# Patient Record
Sex: Female | Born: 1985 | Race: Black or African American | Hispanic: No | Marital: Single | Smoking: Current some day smoker
Health system: Southern US, Community
[De-identification: ages and names within clinical notes are randomized; demographics above are authoritative.]

## PROBLEM LIST (undated history)

## (undated) DIAGNOSIS — M5030 Other cervical disc degeneration, unspecified cervical region: Secondary | ICD-10-CM

## (undated) DIAGNOSIS — Z8719 Personal history of other diseases of the digestive system: Secondary | ICD-10-CM

## (undated) DIAGNOSIS — M545 Low back pain, unspecified: Secondary | ICD-10-CM

## (undated) DIAGNOSIS — Z8739 Personal history of other diseases of the musculoskeletal system and connective tissue: Secondary | ICD-10-CM

## (undated) DIAGNOSIS — M1990 Unspecified osteoarthritis, unspecified site: Secondary | ICD-10-CM

## (undated) DIAGNOSIS — R200 Anesthesia of skin: Secondary | ICD-10-CM

## (undated) DIAGNOSIS — F419 Anxiety disorder, unspecified: Secondary | ICD-10-CM

## (undated) DIAGNOSIS — B9681 Helicobacter pylori [H. pylori] as the cause of diseases classified elsewhere: Secondary | ICD-10-CM

## (undated) DIAGNOSIS — IMO0002 Reserved for concepts with insufficient information to code with codable children: Secondary | ICD-10-CM

## (undated) DIAGNOSIS — K279 Peptic ulcer, site unspecified, unspecified as acute or chronic, without hemorrhage or perforation: Secondary | ICD-10-CM

## (undated) DIAGNOSIS — M549 Dorsalgia, unspecified: Secondary | ICD-10-CM

## (undated) DIAGNOSIS — G8929 Other chronic pain: Secondary | ICD-10-CM

## (undated) HISTORY — DX: Low back pain, unspecified: M54.50

## (undated) HISTORY — PX: COLONOSCOPY: SHX174

## (undated) HISTORY — DX: Dorsalgia, unspecified: M54.9

## (undated) HISTORY — DX: Anesthesia of skin: R20.0

## (undated) HISTORY — DX: Other chronic pain: G89.29

## (undated) HISTORY — DX: Personal history of other diseases of the musculoskeletal system and connective tissue: Z87.39

## (undated) HISTORY — DX: Other cervical disc degeneration, unspecified cervical region: M50.30

---
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
## 2008-02-26 ENCOUNTER — Emergency Department (HOSPITAL_COMMUNITY): Admission: EM | Admit: 2008-02-26 | Discharge: 2008-02-26 | Payer: Self-pay | Admitting: Emergency Medicine

## 2008-06-08 ENCOUNTER — Emergency Department (HOSPITAL_COMMUNITY): Admission: EM | Admit: 2008-06-08 | Discharge: 2008-06-08 | Payer: Self-pay | Admitting: Emergency Medicine

## 2008-12-05 ENCOUNTER — Emergency Department (HOSPITAL_COMMUNITY): Admission: EM | Admit: 2008-12-05 | Discharge: 2008-12-05 | Payer: Self-pay | Admitting: Emergency Medicine

## 2008-12-09 ENCOUNTER — Emergency Department (HOSPITAL_COMMUNITY): Admission: EM | Admit: 2008-12-09 | Discharge: 2008-12-09 | Payer: Self-pay | Admitting: Family Medicine

## 2009-04-10 ENCOUNTER — Emergency Department (HOSPITAL_COMMUNITY): Admission: EM | Admit: 2009-04-10 | Discharge: 2009-04-10 | Payer: Self-pay | Admitting: Emergency Medicine

## 2009-06-30 ENCOUNTER — Emergency Department (HOSPITAL_BASED_OUTPATIENT_CLINIC_OR_DEPARTMENT_OTHER): Admission: EM | Admit: 2009-06-30 | Discharge: 2009-06-30 | Payer: Self-pay | Admitting: Emergency Medicine

## 2009-07-31 ENCOUNTER — Emergency Department (HOSPITAL_BASED_OUTPATIENT_CLINIC_OR_DEPARTMENT_OTHER): Admission: EM | Admit: 2009-07-31 | Discharge: 2009-07-31 | Payer: Self-pay | Admitting: Emergency Medicine

## 2009-09-14 ENCOUNTER — Emergency Department (HOSPITAL_BASED_OUTPATIENT_CLINIC_OR_DEPARTMENT_OTHER): Admission: EM | Admit: 2009-09-14 | Discharge: 2009-09-15 | Payer: Self-pay | Admitting: Emergency Medicine

## 2009-11-01 ENCOUNTER — Emergency Department (HOSPITAL_COMMUNITY): Admission: EM | Admit: 2009-11-01 | Discharge: 2009-11-01 | Payer: Self-pay | Admitting: Family Medicine

## 2009-12-29 ENCOUNTER — Emergency Department (HOSPITAL_BASED_OUTPATIENT_CLINIC_OR_DEPARTMENT_OTHER): Admission: EM | Admit: 2009-12-29 | Discharge: 2009-12-29 | Payer: Self-pay | Admitting: Emergency Medicine

## 2010-01-19 ENCOUNTER — Emergency Department (HOSPITAL_COMMUNITY): Admission: EM | Admit: 2010-01-19 | Discharge: 2010-01-19 | Payer: Self-pay | Admitting: Family Medicine

## 2010-05-16 ENCOUNTER — Emergency Department (HOSPITAL_BASED_OUTPATIENT_CLINIC_OR_DEPARTMENT_OTHER)
Admission: EM | Admit: 2010-05-16 | Discharge: 2010-05-16 | Payer: Self-pay | Source: Home / Self Care | Admitting: Emergency Medicine

## 2010-08-20 LAB — WET PREP, GENITAL: Trich, Wet Prep: NONE SEEN

## 2010-08-20 LAB — RPR: RPR Ser Ql: NONREACTIVE

## 2010-08-20 LAB — HIV ANTIBODY (ROUTINE TESTING W REFLEX): HIV: NONREACTIVE

## 2010-08-21 LAB — CBC
MCH: 29.1 pg (ref 26.0–34.0)
MCHC: 32.5 g/dL (ref 30.0–36.0)
MCV: 89.4 fL (ref 78.0–100.0)
Platelets: 322 10*3/uL (ref 150–400)
RBC: 5.03 MIL/uL (ref 3.87–5.11)

## 2010-08-21 LAB — URINALYSIS, ROUTINE W REFLEX MICROSCOPIC
Hgb urine dipstick: NEGATIVE
Ketones, ur: 40 mg/dL — AB
Nitrite: NEGATIVE
Specific Gravity, Urine: 1.035 — ABNORMAL HIGH (ref 1.005–1.030)
Urobilinogen, UA: 1 mg/dL (ref 0.0–1.0)

## 2010-08-21 LAB — COMPREHENSIVE METABOLIC PANEL
AST: 21 U/L (ref 0–37)
Albumin: 4.5 g/dL (ref 3.5–5.2)
BUN: 11 mg/dL (ref 6–23)
CO2: 27 mEq/L (ref 19–32)
Calcium: 9.6 mg/dL (ref 8.4–10.5)
Chloride: 105 mEq/L (ref 96–112)
Creatinine, Ser: 0.8 mg/dL (ref 0.4–1.2)
GFR calc Af Amer: >60 mL/min (ref >60)
GFR calc non Af Amer: >60 mL/min (ref >60)
Total Bilirubin: 0.8 mg/dL (ref 0.3–1.2)

## 2010-08-21 LAB — DIFFERENTIAL
Basophils Absolute: 0.2 10*3/uL — ABNORMAL HIGH (ref 0.0–0.1)
Lymphocytes Relative: 5 % — ABNORMAL LOW (ref 12–46)
Lymphs Abs: 0.6 10*3/uL — ABNORMAL LOW (ref 0.7–4.0)
Neutro Abs: 9.9 10*3/uL — ABNORMAL HIGH (ref 1.7–7.7)

## 2010-08-21 LAB — PREGNANCY, URINE: Preg Test, Ur: NEGATIVE

## 2010-08-21 LAB — LIPASE, BLOOD: Lipase: 94 U/L (ref 23–300)

## 2010-09-08 LAB — STREP A DNA PROBE: Group A Strep Probe: NEGATIVE

## 2010-12-13 ENCOUNTER — Emergency Department (INDEPENDENT_AMBULATORY_CARE_PROVIDER_SITE_OTHER): Payer: Managed Care, Other (non HMO)

## 2010-12-13 ENCOUNTER — Emergency Department (HOSPITAL_BASED_OUTPATIENT_CLINIC_OR_DEPARTMENT_OTHER)
Admission: EM | Admit: 2010-12-13 | Discharge: 2010-12-13 | Disposition: A | Discharge status: Home / Self Care | Payer: Managed Care, Other (non HMO) | Attending: Emergency Medicine | Admitting: Emergency Medicine

## 2010-12-13 ENCOUNTER — Encounter: Payer: Self-pay | Admitting: *Deleted

## 2010-12-13 ENCOUNTER — Emergency Department (HOSPITAL_BASED_OUTPATIENT_CLINIC_OR_DEPARTMENT_OTHER): Payer: Managed Care, Other (non HMO)

## 2010-12-13 DIAGNOSIS — M25476 Effusion, unspecified foot: Secondary | ICD-10-CM

## 2010-12-13 DIAGNOSIS — X58XXXA Exposure to other specified factors, initial encounter: Secondary | ICD-10-CM

## 2010-12-13 DIAGNOSIS — M25579 Pain in unspecified ankle and joints of unspecified foot: Secondary | ICD-10-CM

## 2010-12-13 DIAGNOSIS — M25473 Effusion, unspecified ankle: Secondary | ICD-10-CM

## 2010-12-13 DIAGNOSIS — S93409A Sprain of unspecified ligament of unspecified ankle, initial encounter: Secondary | ICD-10-CM

## 2010-12-13 DIAGNOSIS — Y9367 Activity, basketball: Secondary | ICD-10-CM | POA: Insufficient documentation

## 2010-12-13 DIAGNOSIS — W19XXXA Unspecified fall, initial encounter: Secondary | ICD-10-CM | POA: Insufficient documentation

## 2010-12-13 MED ORDER — HYDROCODONE-ACETAMINOPHEN 5-325 MG PO TABS: 2.0000 | ORAL_TABLET | ORAL | Status: AC | PRN

## 2010-12-13 MED ORDER — HYDROCODONE-ACETAMINOPHEN 5-325 MG PO TABS
2.0000 | ORAL_TABLET | Freq: Once | ORAL | Status: AC
  Administered 2010-12-13: 2 via ORAL
  Filled 2010-12-13: qty 2

## 2010-12-13 NOTE — ED Provider Notes (Signed)
History     Chief Complaint  Patient presents with  . Ankle Pain    Right ankle inj yesterday. Leg bent backward while playing and she heard a pop.   HPI Comments: Pt turned ankle,  Pt complains of swelling and pain  Patient is a 25 y.o. female presenting with ankle pain. The history is provided by the patient.  Ankle Pain  The incident occurred yesterday. The injury mechanism was torsion. The pain is present in the left ankle. The pain is at a severity of 7/10. The pain is moderate. Associated symptoms include inability to bear weight.    History reviewed. No pertinent past medical history.  History reviewed. No pertinent past surgical history.  History reviewed. No pertinent family history.  History  Substance Use Topics  . Smoking status: Current Some Day Smoker  . Smokeless tobacco: Not on file  . Alcohol Use: 1.2 oz/week    2 Shots of liquor per week    OB History    Grav Para Term Preterm Abortions TAB SAB Ect Mult Living                  Review of Systems  Constitutional: Negative.   Musculoskeletal: Positive for joint swelling.  Skin: Positive for color change.    Physical Exam  BP 113/66  Pulse 59  Temp(Src) 99.1 F (37.3 C) (Oral)  Resp 20  Ht 4\' 11"  (1.499 m)  Wt 135 lb (61.236 kg)  BMI 27.27 kg/m2  SpO2 100%  LMP 11/16/2010  Physical Exam  Constitutional: She appears well-developed and well-nourished.  HENT:  Head: Normocephalic.  Musculoskeletal: She exhibits edema and tenderness.       Ankle,  Decreased range of motion,  nv and ns intact,  No gross instability    ED Course  Procedures  MDM No fx,  Pt placed in aso and crutches,  Pt referred to Dr. Pearletha Forge for followup      Langston Masker, [place] 12/13/10 1311 Medical screening examination/treatment/procedure(s) were performed by non-physician practitioner and as supervising physician I was immediately available for consultation/collaboration.   Doug Sou, MD 12/17/10 1453

## 2011-03-05 ENCOUNTER — Emergency Department (HOSPITAL_BASED_OUTPATIENT_CLINIC_OR_DEPARTMENT_OTHER)
Admission: EM | Admit: 2011-03-05 | Discharge: 2011-03-05 | Disposition: A | Discharge status: Home / Self Care | Payer: Managed Care, Other (non HMO) | Attending: Emergency Medicine | Admitting: Emergency Medicine

## 2011-03-05 ENCOUNTER — Encounter (HOSPITAL_BASED_OUTPATIENT_CLINIC_OR_DEPARTMENT_OTHER): Payer: Self-pay | Admitting: Emergency Medicine

## 2011-03-05 DIAGNOSIS — J029 Acute pharyngitis, unspecified: Secondary | ICD-10-CM

## 2011-03-05 MED ORDER — DEXAMETHASONE SODIUM PHOSPHATE 10 MG/ML IJ SOLN
10.0000 mg | Freq: Once | INTRAMUSCULAR | Status: AC
  Administered 2011-03-05: 10 mg via INTRAMUSCULAR
  Filled 2011-03-05: qty 1

## 2011-03-05 NOTE — ED Notes (Signed)
Pt reports sore throat x 3 day

## 2011-03-05 NOTE — ED Provider Notes (Signed)
History   Chart scribed for Nat Christen, MD by Enos Fling; the patient was seen in room [facility]3/[facility]; this patient's care was started at 6:04 PM.    CSN: [id number] Arrival date & time: 03/05/2011  5:17 PM  Chief Complaint  Patient presents with  . Sore Throat    sore throat x 3 days    HPI Gwynn Chalker is a 25 y.o. female who presents to the Emergency Department complaining of sore throat. Pt states sore throat onset yesterday, has been constant since and worse with swallowing. +cough. Pt noticed redness to back of her throat, concerned for strep. Pt tolerating fluids well. No congestion, headache, nausea, vomiting, fever, or chills. No sick contacts.   History reviewed. No pertinent past medical history.  History reviewed. No pertinent past surgical history.  History reviewed. No pertinent family history.  History  Substance Use Topics  . Smoking status: Current Some Day Smoker  . Smokeless tobacco: Not on file  . Alcohol Use: 1.2 oz/week    2 Shots of liquor per week    OB History    Grav Para Term Preterm Abortions TAB SAB Ect Mult Living                  Review of Systems  Constitutional: Negative for fever and chills.  HENT: Positive for sore throat. Negative for congestion, rhinorrhea, trouble swallowing and sinus pressure.   Eyes: Negative for redness.  Respiratory: Positive for cough. Negative for shortness of breath.   Cardiovascular: Negative for chest pain and leg swelling.  Gastrointestinal: Negative for nausea, vomiting, abdominal pain and diarrhea.  Genitourinary: Negative for flank pain.  Musculoskeletal: Negative for back pain.  Skin: Negative for rash.  Neurological: Negative for headaches.    Allergies  Review of patient's allergies indicates no known allergies.  Home Medications  No current outpatient prescriptions on file.  BP 131/76  Pulse 109  Temp(Src) 98.7 F (37.1 C) (Oral)  Resp 22  SpO2 99%  LMP 02/02/2011  Physical  Exam  Constitutional: She is oriented to person, place, and time. She appears well-developed and well-nourished.  Non-toxic appearance. She does not have a sickly appearance.  HENT:  Head: Normocephalic and atraumatic.  Nose: Nose normal.  Mouth/Throat: Uvula is midline. Mucous membranes are not dry. Oropharyngeal exudate, posterior oropharyngeal edema and posterior oropharyngeal erythema present. No tonsillar abscesses.  Eyes: Conjunctivae, EOM and lids are normal. Pupils are equal, round, and reactive to light. No scleral icterus.  Neck: Trachea normal and normal range of motion. Neck supple.  Cardiovascular: Regular rhythm and normal heart sounds.   Pulmonary/Chest: Effort normal and breath sounds normal. She has no wheezes. She has no rales.  Abdominal: Soft. Normal appearance. There is no tenderness. There is no rebound, no guarding and no CVA tenderness.  Musculoskeletal: Normal range of motion.  Lymphadenopathy:    She has no cervical adenopathy.  Neurological: She is alert and oriented to person, place, and time. She has normal strength.  Skin: Skin is warm, dry and intact. No rash noted.    ED Course  Procedures - none   Labs Reviewed  RAPID STREP SCREEN   All results reviewed and discussed, questions answered, pt agreeable with plan.  OTHER DATA REVIEWED: Nursing notes and vital signs reviewed.   MEDS GIVEN IN ED: Medications - No data to display    MDM  Patient with viral pharyngitis given her negative strep exam at this point in time. She is able to  tolerate by mouth intake currently. She appears well and is in no respiratory distress. He should be given shot of Decadron to decrease her swelling and had improvement in her symptoms sooner.   IMPRESSION: No diagnosis found.  DISCHARGE MEDICATIONS: New Prescriptions   No medications on file    SCRIBE ATTESTATION: I personally performed the services described in this documentation, which was scribed in my  presence. The recorded information has been reviewed and considered.       Nat Christen, MD 03/05/11 [phone]

## 2011-03-07 LAB — POCT URINALYSIS DIP (DEVICE)
Bilirubin Urine: NEGATIVE
Hgb urine dipstick: NEGATIVE
Ketones, ur: NEGATIVE
Protein, ur: NEGATIVE
Specific Gravity, Urine: 1.025
pH: 6.5

## 2011-03-08 ENCOUNTER — Emergency Department (HOSPITAL_BASED_OUTPATIENT_CLINIC_OR_DEPARTMENT_OTHER)
Admission: EM | Admit: 2011-03-08 | Discharge: 2011-03-08 | Disposition: A | Discharge status: Home / Self Care | Payer: Managed Care, Other (non HMO) | Attending: Emergency Medicine | Admitting: Emergency Medicine

## 2011-03-08 ENCOUNTER — Encounter (HOSPITAL_BASED_OUTPATIENT_CLINIC_OR_DEPARTMENT_OTHER): Payer: Self-pay | Admitting: *Deleted

## 2011-03-08 DIAGNOSIS — J039 Acute tonsillitis, unspecified: Secondary | ICD-10-CM

## 2011-03-08 MED ORDER — METHYLPREDNISOLONE 4 MG PO KIT: PACK | ORAL | Status: AC

## 2011-03-08 MED ORDER — IBUPROFEN 800 MG PO TABS
800.0000 mg | ORAL_TABLET | Freq: Once | ORAL | Status: AC
  Administered 2011-03-08: 800 mg via ORAL
  Filled 2011-03-08: qty 1

## 2011-03-08 MED ORDER — OXYCODONE-ACETAMINOPHEN 5-325 MG PO TABS
1.0000 | ORAL_TABLET | Freq: Once | ORAL | Status: AC
  Administered 2011-03-08: 1 via ORAL
  Filled 2011-03-08: qty 1

## 2011-03-08 MED ORDER — CLINDAMYCIN HCL 150 MG PO CAPS: 150.0000 mg | ORAL_CAPSULE | Freq: Four times a day (QID) | ORAL | Status: AC

## 2011-03-08 MED ORDER — HYDROCODONE-ACETAMINOPHEN 5-500 MG PO TABS: 1.0000 | ORAL_TABLET | Freq: Four times a day (QID) | ORAL | Status: AC | PRN

## 2011-03-08 NOTE — ED Notes (Signed)
Sore throat, cough, ear pressure.

## 2011-03-08 NOTE — ED Notes (Signed)
Pt medicated

## 2011-03-08 NOTE — ED Provider Notes (Signed)
History     CSN: [id number] Arrival date & time: 03/08/2011  1:21 PM  Chief Complaint  Patient presents with  . Sore Throat    (Consider location/radiation/quality/duration/timing/severity/associated sxs/prior treatment) HPI Patient presents with complaint of sore throat. Sore throat has been present for the past 3-4 days and continuous. Patient has pain with swallowing. Has been able to swallow liquids however and no difficulty breathing. States she's had occasional cough and intermittent sweating. No fever. Was seen in the ED 3 days ago and had a negative rapid strep screen performed. Was given Decadron IM for inflammation of her throat however patient states that pain has continued and not improved.  History reviewed. No pertinent past medical history.  History reviewed. No pertinent past surgical history.  No family history on file.  History  Substance Use Topics  . Smoking status: Current Some Day Smoker  . Smokeless tobacco: Not on file  . Alcohol Use: 1.2 oz/week    2 Shots of liquor per week    OB History    Grav Para Term Preterm Abortions TAB SAB Ect Mult Living                  Review of Systems ROS reviewed and otherwise negative except for mentioned in HPI  Allergies  Review of patient's allergies indicates no known allergies.  Home Medications  No current outpatient prescriptions on file.  BP 114/69  Pulse 82  Temp(Src) 98.4 F (36.9 C) (Oral)  Resp 20  SpO2 100%  LMP 02/02/2011 Vitals reviewed Physical Exam Physical Examination: General appearance - alert, well appearing, and in no distress Mental status - alert, oriented to person, place, and time Eyes - pupils equal and reactive, extraocular eye movements intact Mouth - MMM, posterior OP with moderate erythema, bilateral exudate, palate symmetric, uvula midline Lymphatics - no hepatosplenomegaly, shotty anterior cervical LAD Chest - clear to auscultation, no wheezes, rales or rhonchi,  symmetric air entry Heart - normal rate, regular rhythm, normal S1, S2, no murmurs, rubs, clicks or gallops Abdomen - soft, nontender, nondistended, no masses or organomegaly Extremities - peripheral pulses normal, no pedal edema, no clubbing or cyanosis Skin - normal coloration and turgor, no rashes, no suspicious skin lesions noted   ED Course  Procedures (including critical care time)   Labs Reviewed  RAPID STREP SCREEN  MONONUCLEOSIS SCREEN   No results found. Rapid strep and mono screen both negative.  Suspect viral infection, however will treat with antibiotics in case of strep infection.  Pt also given medrol dose pack to help with inflammation.    No diagnosis found.    MDM  Pt with continued sore throat, tonsils swollen, erythematous with exudate.  Will recheck strep screen, and monospot.  Pt appears well hydrated and nontoxic. Pain control given as well.  No evidence of peritonsillar abscess on exam.   Discharged with strict return precautions.  Pt agreeable with plan.       Ethelda Chick, MD 03/08/11 [phone]

## 2011-06-07 DIAGNOSIS — Z8739 Personal history of other diseases of the musculoskeletal system and connective tissue: Secondary | ICD-10-CM

## 2011-06-07 HISTORY — DX: Personal history of other diseases of the musculoskeletal system and connective tissue: Z87.39

## 2011-06-17 ENCOUNTER — Other Ambulatory Visit: Payer: Self-pay | Admitting: Obstetrics and Gynecology

## 2011-06-17 ENCOUNTER — Other Ambulatory Visit (HOSPITAL_COMMUNITY)
Admission: RE | Admit: 2011-06-17 | Discharge: 2011-06-17 | Disposition: A | Discharge status: Home / Self Care | Payer: Managed Care, Other (non HMO) | Source: Ambulatory Visit | Attending: Obstetrics and Gynecology | Admitting: Obstetrics and Gynecology

## 2011-06-17 DIAGNOSIS — Z113 Encounter for screening for infections with a predominantly sexual mode of transmission: Secondary | ICD-10-CM | POA: Insufficient documentation

## 2011-06-17 DIAGNOSIS — Z124 Encounter for screening for malignant neoplasm of cervix: Secondary | ICD-10-CM | POA: Insufficient documentation

## 2011-07-05 ENCOUNTER — Ambulatory Visit (INDEPENDENT_AMBULATORY_CARE_PROVIDER_SITE_OTHER): Payer: Managed Care, Other (non HMO) | Admitting: General Surgery

## 2011-07-05 ENCOUNTER — Encounter (INDEPENDENT_AMBULATORY_CARE_PROVIDER_SITE_OTHER): Payer: Self-pay | Admitting: General Surgery

## 2011-07-05 VITALS — BP 116/68 | HR 72 | Temp 100.5°F | Resp 16 | Ht 59.0 in | Wt 132.0 lb

## 2011-07-05 DIAGNOSIS — L0591 Pilonidal cyst without abscess: Secondary | ICD-10-CM

## 2011-07-05 DIAGNOSIS — L0592 Pilonidal sinus without abscess: Secondary | ICD-10-CM | POA: Insufficient documentation

## 2011-07-05 NOTE — Progress Notes (Signed)
HPI The patient comes in with a history of a previously incised and drained pilonidal cyst about a year ago.  PE On examination she has about a 1-1/2 cm right intergluteal laceration which is well healed no evidence of acute infection. 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 There is no subcutaneous cyst palpable at this time.  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  Plan The patient has been asymptomatic for year and currently her complaints are pain did extend down to the rectum. However I do not see any evidence of recurrent disease at this time.  I would wait until she became symptomatic again and it became inflamed prior on antibiotics if need be drained her again and then plan for wide excision. She's been asymptomatic and therefore do not think she requires wide excision at this time.

## 2012-03-13 ENCOUNTER — Emergency Department (HOSPITAL_BASED_OUTPATIENT_CLINIC_OR_DEPARTMENT_OTHER)
Admission: EM | Admit: 2012-03-13 | Discharge: 2012-03-13 | Disposition: A | Discharge status: Home / Self Care | Payer: Managed Care, Other (non HMO) | Attending: Emergency Medicine | Admitting: Emergency Medicine

## 2012-03-13 ENCOUNTER — Encounter (HOSPITAL_BASED_OUTPATIENT_CLINIC_OR_DEPARTMENT_OTHER): Payer: Self-pay

## 2012-03-13 DIAGNOSIS — Z803 Family history of malignant neoplasm of breast: Secondary | ICD-10-CM | POA: Insufficient documentation

## 2012-03-13 DIAGNOSIS — R197 Diarrhea, unspecified: Secondary | ICD-10-CM | POA: Insufficient documentation

## 2012-03-13 DIAGNOSIS — Z87891 Personal history of nicotine dependence: Secondary | ICD-10-CM | POA: Insufficient documentation

## 2012-03-13 DIAGNOSIS — R1110 Vomiting, unspecified: Secondary | ICD-10-CM | POA: Insufficient documentation

## 2012-03-13 LAB — URINALYSIS, ROUTINE W REFLEX MICROSCOPIC
Nitrite: NEGATIVE
Protein, ur: NEGATIVE mg/dL
Specific Gravity, Urine: 1.007 (ref 1.005–1.030)
Urobilinogen, UA: 0.2 mg/dL (ref 0.0–1.0)

## 2012-03-13 LAB — URINE MICROSCOPIC-ADD ON

## 2012-03-13 MED ORDER — ONDANSETRON 8 MG PO TBDP
8.0000 mg | ORAL_TABLET | Freq: Once | ORAL | Status: AC
  Administered 2012-03-13: 8 mg via ORAL
  Filled 2012-03-13: qty 1

## 2012-03-13 NOTE — ED Notes (Signed)
Pt reports abdominal pain, nausea, vomiting and diarrhea x 3 weeks.

## 2012-03-13 NOTE — ED Provider Notes (Signed)
History     CSN: [id number]  Arrival date & time 03/13/12  1333   First MD Initiated Contact with Patient 03/13/12 1451      Chief Complaint  Patient presents with  . Abdominal Pain  . Nausea  . Emesis  . Diarrhea    (Consider location/radiation/quality/duration/timing/severity/associated sxs/prior treatment) HPI  Patient complaining of some diffuse crampy abdominal pain for several days. She states that she has had loose stools 4-5 times per day for a week. She also complains of nausea with one episode of vomiting per day he states past week. She has had some intermittent subjective fever and headaches. She is living in the house mother and states that no one else has had similar symptoms. She voices concern regarding HIV exposure in prison. She states that for exposure would have been oral vaginal. She denies any head injury, sore throat, chest pain, cough, dyspnea, abnormal vaginal discharge, blood in stool, or rash. She denies any history of sexual transmitted disease.  Past Medical History  Diagnosis Date  . Pilonidal cyst     History reviewed. No pertinent past surgical history.  Family History  Problem Relation Age of Onset  . Cancer Maternal Grandmother     breast    History  Substance Use Topics  . Smoking status: Former Smoker -- 0.2 packs/day  . Smokeless tobacco: Not on file  . Alcohol Use: 1.2 oz/week    2 Shots of liquor per week     weekends    OB History    Grav Para Term Preterm Abortions TAB SAB Ect Mult Living                  Review of Systems  Constitutional: Negative for fever, chills, activity change, appetite change and unexpected weight change.  HENT: Negative for sore throat, rhinorrhea, neck pain, neck stiffness and sinus pressure.   Eyes: Negative for visual disturbance.  Respiratory: Negative for cough and shortness of breath.   Cardiovascular: Negative for chest pain and leg swelling.  Gastrointestinal: Negative for vomiting,  abdominal pain, diarrhea and blood in stool.  Genitourinary: Negative for dysuria, urgency, frequency, vaginal discharge and difficulty urinating.  Musculoskeletal: Negative for myalgias, arthralgias and gait problem.  Skin: Negative for color change and rash.  Neurological: Negative for weakness, light-headedness and headaches.  Hematological: Does not bruise/bleed easily.  Psychiatric/Behavioral: Negative for dysphoric mood.    Allergies  Review of patient's allergies indicates no known allergies.  Home Medications   Current Outpatient Rx  Name Route Sig Dispense Refill  . MEDROXYPROGESTERONE ACETATE 150 MG/ML IM SUSP  Every 3 months.    . MEFENAMIC ACID 250 MG PO CAPS  Ad lib.      BP 127/70  Pulse 94  Temp 98.1 F (36.7 C) (Oral)  Resp 20  Ht 4\' 11"  (1.499 m)  Wt 134 lb (60.782 kg)  BMI 27.06 kg/m2  SpO2 100%  LMP 11/12/2011  Physical Exam  Nursing note and vitals reviewed. Constitutional: She is oriented to person, place, and time. She appears well-developed and well-nourished.  HENT:  Head: Normocephalic and atraumatic.  Right Ear: External ear normal.  Left Ear: External ear normal.  Eyes: Conjunctivae normal and EOM are normal. Pupils are equal, round, and reactive to light.  Neck: Normal range of motion. Neck supple.  Cardiovascular: Normal rate and regular rhythm.   Pulmonary/Chest: Effort normal and breath sounds normal.  Abdominal: Soft. Bowel sounds are normal. There is no tenderness. There is no  rebound.  Musculoskeletal: Normal range of motion.  Neurological: She is alert and oriented to person, place, and time.  Skin: Skin is warm and dry.  Psychiatric: She has a normal mood and affect.    ED Course  Procedures (including critical care time)  Labs Reviewed  URINALYSIS, ROUTINE W REFLEX MICROSCOPIC - Abnormal; Notable for the following:    APPearance CLOUDY (*)     Hgb urine dipstick LARGE (*)     Leukocytes, UA SMALL (*)     All other  components within normal limits  URINE MICROSCOPIC-ADD ON - Abnormal; Notable for the following:    Squamous Epithelial / LPF FEW (*)     Bacteria, UA MANY (*)     All other components within normal limits  PREGNANCY, URINE   No results found.   No diagnosis found.  Results for orders placed during the hospital encounter of 03/13/12  PREGNANCY, URINE      Component Value Range   Preg Test, Ur NEGATIVE  NEGATIVE  URINALYSIS, ROUTINE W REFLEX MICROSCOPIC      Component Value Range   Color, Urine YELLOW  YELLOW   APPearance CLOUDY (*) CLEAR   Specific Gravity, Urine 1.007  1.005 - 1.030   pH 7.0  5.0 - 8.0   Glucose, UA NEGATIVE  NEGATIVE mg/dL   Hgb urine dipstick LARGE (*) NEGATIVE   Bilirubin Urine NEGATIVE  NEGATIVE   Ketones, ur NEGATIVE  NEGATIVE mg/dL   Protein, ur NEGATIVE  NEGATIVE mg/dL   Urobilinogen, UA 0.2  0.0 - 1.0 mg/dL   Nitrite NEGATIVE  NEGATIVE   Leukocytes, UA SMALL (*) NEGATIVE  URINE MICROSCOPIC-ADD ON      Component Value Range   Squamous Epithelial / LPF FEW (*) RARE   WBC, UA 7-10  <3 WBC/hpf   RBC / HPF 3-6  <3 RBC/hpf   Bacteria, UA MANY (*) RARE   Urine-Other MUCOUS PRESENT       MDM  Patient with negative pregnancy test here. Her urine appears to be contaminated. She had her last at the shot in April and has had some spotting. She does not give any other symptoms consistent with urinary tract infection. Plan to treat her vomiting and diarrhea symptomatically with Zofran. The patient is advised regarding sexually transmitted diseases and is given referral to the health department for followup HIV testing.       Hilario Quarry, MD 03/13/12 [phone]

## 2012-03-13 NOTE — ED Notes (Signed)
Pt reports she is also concerned with HIV and wants to be tested due to unprotected sex and partner being HIV positive.

## 2012-05-16 ENCOUNTER — Emergency Department (HOSPITAL_BASED_OUTPATIENT_CLINIC_OR_DEPARTMENT_OTHER)
Admission: EM | Admit: 2012-05-16 | Discharge: 2012-05-16 | Disposition: A | Discharge status: Home / Self Care | Payer: Managed Care, Other (non HMO) | Attending: Emergency Medicine | Admitting: Emergency Medicine

## 2012-05-16 ENCOUNTER — Encounter (HOSPITAL_BASED_OUTPATIENT_CLINIC_OR_DEPARTMENT_OTHER): Payer: Self-pay | Admitting: *Deleted

## 2012-05-16 DIAGNOSIS — Z872 Personal history of diseases of the skin and subcutaneous tissue: Secondary | ICD-10-CM | POA: Insufficient documentation

## 2012-05-16 DIAGNOSIS — F17200 Nicotine dependence, unspecified, uncomplicated: Secondary | ICD-10-CM | POA: Insufficient documentation

## 2012-05-16 DIAGNOSIS — M545 Low back pain, unspecified: Secondary | ICD-10-CM | POA: Insufficient documentation

## 2012-05-16 DIAGNOSIS — Z3202 Encounter for pregnancy test, result negative: Secondary | ICD-10-CM | POA: Insufficient documentation

## 2012-05-16 DIAGNOSIS — M549 Dorsalgia, unspecified: Secondary | ICD-10-CM

## 2012-05-16 DIAGNOSIS — Z79899 Other long term (current) drug therapy: Secondary | ICD-10-CM | POA: Insufficient documentation

## 2012-05-16 LAB — PREGNANCY, URINE: Preg Test, Ur: NEGATIVE

## 2012-05-16 LAB — URINALYSIS, ROUTINE W REFLEX MICROSCOPIC
Glucose, UA: NEGATIVE mg/dL
Ketones, ur: NEGATIVE mg/dL
Leukocytes, UA: NEGATIVE
Nitrite: NEGATIVE
Protein, ur: NEGATIVE mg/dL

## 2012-05-16 MED ORDER — HYDROCODONE-ACETAMINOPHEN 5-325 MG PO TABS: 2.0000 | ORAL_TABLET | ORAL | Status: DC | PRN

## 2012-05-16 MED ORDER — CYCLOBENZAPRINE HCL 10 MG PO TABS: 10.0000 mg | ORAL_TABLET | Freq: Two times a day (BID) | ORAL | Status: DC | PRN

## 2012-05-16 MED ORDER — IBUPROFEN 800 MG PO TABS: 800.0000 mg | ORAL_TABLET | Freq: Three times a day (TID) | ORAL | Status: DC

## 2012-05-16 MED ORDER — IBUPROFEN 800 MG PO TABS
800.0000 mg | ORAL_TABLET | Freq: Once | ORAL | Status: AC
  Administered 2012-05-16: 800 mg via ORAL
  Filled 2012-05-16: qty 1

## 2012-05-16 NOTE — ED Provider Notes (Signed)
History     CSN: [id number]  Arrival date & time 05/16/12  2125   First MD Initiated Contact with Patient 05/16/12 2141      Chief Complaint  Patient presents with  . Back Pain    (Consider location/radiation/quality/duration/timing/severity/associated sxs/prior treatment) HPI Comments: Patient presents with low back pain that she's had for several years but worse in the past month. It is worse with bending and twisting. She taking ibuprofen without relief. She denies any specific injury but does lift at work. The pain does not radiate. There is no focal weakness, numbness or tingling. No bowel or bladder incontinence, fever or vomiting. No urinary or vaginal symptoms. She has not had her back evaluated in the past.  The history is provided by the patient.    Past Medical History  Diagnosis Date  . Pilonidal cyst     History reviewed. No pertinent past surgical history.  Family History  Problem Relation Age of Onset  . Cancer Maternal Grandmother     breast    History  Substance Use Topics  . Smoking status: Current Every Day Smoker -- 0.5 packs/day    Types: Cigarettes  . Smokeless tobacco: Not on file  . Alcohol Use: 1.2 oz/week    2 Shots of liquor per week     Comment: weekends    OB History    Grav Para Term Preterm Abortions TAB SAB Ect Mult Living                  Review of Systems  Constitutional: Negative for fever, activity change and appetite change.  HENT: Negative for congestion and rhinorrhea.   Respiratory: Negative for chest tightness and shortness of breath.   Gastrointestinal: Negative for nausea and abdominal pain.  Genitourinary: Negative for dysuria, hematuria, vaginal bleeding and vaginal discharge.  Musculoskeletal: Positive for back pain.  Neurological: Negative for dizziness, weakness and headaches.    Allergies  Review of patient's allergies indicates no known allergies.  Home Medications   Current Outpatient Rx  Name  Route   Sig  Dispense  Refill  . HYDROCODONE-ACETAMINOPHEN 5-325 MG PO TABS   Oral   Take 1 tablet by mouth every 6 (six) hours as needed.         . IBUPROFEN 200 MG PO TABS   Oral   Take 200 mg by mouth every 6 (six) hours as needed.         . CYCLOBENZAPRINE HCL 10 MG PO TABS   Oral   Take 1 tablet (10 mg total) by mouth 2 (two) times daily as needed for muscle spasms.   20 tablet   0   . HYDROCODONE-ACETAMINOPHEN 5-325 MG PO TABS   Oral   Take 2 tablets by mouth every 4 (four) hours as needed for pain.   10 tablet   0   . IBUPROFEN 800 MG PO TABS   Oral   Take 1 tablet (800 mg total) by mouth 3 (three) times daily.   21 tablet   0   . MEDROXYPROGESTERONE ACETATE 150 MG/ML IM SUSP      Every 3 months.         . MEFENAMIC ACID 250 MG PO CAPS      Ad lib.           BP 114/75  Pulse 99  Temp 99 F (37.2 C) (Oral)  Resp 16  Ht 4\' 11"  (1.499 m)  Wt 134 lb (60.782 kg)  BMI  27.06 kg/m2  SpO2 100%  LMP 05/14/2012  Physical Exam  Constitutional: She is oriented to person, place, and time. She appears well-developed and well-nourished. No distress.  HENT:  Head: Normocephalic and atraumatic.  Mouth/Throat: Oropharynx is clear and moist. No oropharyngeal exudate.  Eyes: Conjunctivae normal and EOM are normal. Pupils are equal, round, and reactive to light.  Neck: Normal range of motion. Neck supple.  Cardiovascular: Normal rate, regular rhythm and normal heart sounds.   No murmur heard. Pulmonary/Chest: Effort normal and breath sounds normal. No respiratory distress.  Abdominal: Soft. There is no tenderness. There is no rebound and no guarding.  Musculoskeletal: Normal range of motion. She exhibits tenderness.       Paraspinal lumbar tenderness, no midline pain 5/5 strength in bilateral lower extremities. Ankle plantar and dorsiflexion intact. Great toe extension intact bilaterally. +2 DP and PT pulses. +2 patellar reflexes bilaterally. Normal gait.    Neurological: She is alert and oriented to person, place, and time. No cranial nerve deficit. She exhibits normal muscle tone. Coordination normal.  Skin: Skin is warm.    ED Course  Procedures (including critical care time)   Labs Reviewed  URINALYSIS, ROUTINE W REFLEX MICROSCOPIC  PREGNANCY, URINE   No results found.   1. Back pain       MDM  One month of low back pain without neurological deficit. Vital stable, no distress, no fevers. No neuro deficits or red flags. UA negative. Equal strength and sensation lower extremities. Normal gait.  We'll treat conservatively and followup with sports medicine.       Glynn Octave, MD 05/17/12 [phone]

## 2012-05-16 NOTE — ED Notes (Signed)
Pt reports that she has had back pain for several years but it got progressively worse this year, when asked to narrow down when pain got progressively worse she said within last month. No pcp, but has appointment with some doctor for follow up on back pain next week but can not remember name or type of doctor

## 2012-05-16 NOTE — ED Notes (Signed)
Pt c/o lower back pain x 1 month 

## 2012-09-12 ENCOUNTER — Encounter (HOSPITAL_COMMUNITY): Payer: Self-pay | Admitting: *Deleted

## 2012-09-12 ENCOUNTER — Emergency Department (HOSPITAL_COMMUNITY)
Admission: EM | Admit: 2012-09-12 | Discharge: 2012-09-13 | Disposition: A | Discharge status: Home / Self Care | Payer: Managed Care, Other (non HMO) | Attending: Emergency Medicine | Admitting: Emergency Medicine

## 2012-09-12 DIAGNOSIS — R197 Diarrhea, unspecified: Secondary | ICD-10-CM | POA: Insufficient documentation

## 2012-09-12 DIAGNOSIS — R1033 Periumbilical pain: Secondary | ICD-10-CM | POA: Insufficient documentation

## 2012-09-12 DIAGNOSIS — Z3202 Encounter for pregnancy test, result negative: Secondary | ICD-10-CM | POA: Insufficient documentation

## 2012-09-12 DIAGNOSIS — Z872 Personal history of diseases of the skin and subcutaneous tissue: Secondary | ICD-10-CM | POA: Insufficient documentation

## 2012-09-12 DIAGNOSIS — F17200 Nicotine dependence, unspecified, uncomplicated: Secondary | ICD-10-CM | POA: Insufficient documentation

## 2012-09-12 DIAGNOSIS — R112 Nausea with vomiting, unspecified: Secondary | ICD-10-CM | POA: Insufficient documentation

## 2012-09-12 DIAGNOSIS — M549 Dorsalgia, unspecified: Secondary | ICD-10-CM | POA: Insufficient documentation

## 2012-09-12 DIAGNOSIS — K5900 Constipation, unspecified: Secondary | ICD-10-CM | POA: Insufficient documentation

## 2012-09-12 DIAGNOSIS — J3489 Other specified disorders of nose and nasal sinuses: Secondary | ICD-10-CM | POA: Insufficient documentation

## 2012-09-12 NOTE — ED Notes (Signed)
C/o abd pain x 1 wk; vomiting  X 1

## 2012-09-13 ENCOUNTER — Emergency Department (HOSPITAL_COMMUNITY): Payer: Managed Care, Other (non HMO)

## 2012-09-13 LAB — COMPREHENSIVE METABOLIC PANEL
ALT: 8 U/L (ref 0–35)
AST: 15 U/L (ref 0–37)
Albumin: 3.6 g/dL (ref 3.5–5.2)
Alkaline Phosphatase: 54 U/L (ref 39–117)
BUN: 14 mg/dL (ref 6–23)
Chloride: 108 mEq/L (ref 96–112)
Potassium: 3.2 mEq/L — ABNORMAL LOW (ref 3.5–5.1)
Sodium: 131 mEq/L — ABNORMAL LOW (ref 135–145)
Total Bilirubin: 0.2 mg/dL — ABNORMAL LOW (ref 0.3–1.2)
Total Protein: 6.7 g/dL (ref 6.0–8.3)

## 2012-09-13 LAB — CBC WITH DIFFERENTIAL/PLATELET
Basophils Absolute: 0 10*3/uL (ref 0.0–0.1)
Basophils Relative: 0 % (ref 0–1)
Eosinophils Absolute: 0.1 10*3/uL (ref 0.0–0.7)
Hemoglobin: 13 g/dL (ref 12.0–15.0)
MCH: 30 pg (ref 26.0–34.0)
MCHC: 33.3 g/dL (ref 30.0–36.0)
Neutro Abs: 4.6 10*3/uL (ref 1.7–7.7)
Neutrophils Relative %: 62 % (ref 43–77)
Platelets: 272 10*3/uL (ref 150–400)
RDW: 12.5 % (ref 11.5–15.5)

## 2012-09-13 LAB — URINALYSIS, ROUTINE W REFLEX MICROSCOPIC
Bilirubin Urine: NEGATIVE
Glucose, UA: NEGATIVE mg/dL
Nitrite: NEGATIVE
Specific Gravity, Urine: 1.034 — ABNORMAL HIGH (ref 1.005–1.030)
pH: 7 (ref 5.0–8.0)

## 2012-09-13 LAB — LIPASE, BLOOD: Lipase: 42 U/L (ref 11–59)

## 2012-09-13 MED ORDER — POLYETHYLENE GLYCOL 3350 17 GM/SCOOP PO POWD: 17.0000 g | Freq: Every day | ORAL | Status: DC

## 2012-09-13 MED ORDER — DOCUSATE SODIUM 100 MG PO CAPS: 100.0000 mg | ORAL_CAPSULE | Freq: Two times a day (BID) | ORAL | Status: DC

## 2012-09-13 MED ORDER — TRAMADOL HCL 50 MG PO TABS
50.0000 mg | ORAL_TABLET | Freq: Four times a day (QID) | ORAL | Status: DC | PRN
Start: 2012-09-13 — End: 2013-01-23

## 2012-09-13 MED ORDER — DICYCLOMINE HCL 10 MG/ML IM SOLN
20.0000 mg | Freq: Once | INTRAMUSCULAR | Status: AC
  Administered 2012-09-13: 20 mg via INTRAMUSCULAR
  Filled 2012-09-13: qty 2

## 2012-09-13 MED ORDER — TRAMADOL HCL 50 MG PO TABS
100.0000 mg | ORAL_TABLET | Freq: Once | ORAL | Status: AC
  Administered 2012-09-13: 100 mg via ORAL
  Filled 2012-09-13: qty 2

## 2012-09-13 MED ORDER — IBUPROFEN 800 MG PO TABS
800.0000 mg | ORAL_TABLET | Freq: Once | ORAL | Status: DC
  Filled 2012-09-13: qty 1

## 2012-09-13 NOTE — ED Notes (Signed)
Patient transported to CT 

## 2012-09-13 NOTE — ED Provider Notes (Signed)
History     CSN: [id number]  Arrival date & time 09/12/12  2140   First MD Initiated Contact with Patient 09/12/12 2342      Chief Complaint  Patient presents with  . Abdominal Pain    (Consider location/radiation/quality/duration/timing/severity/associated sxs/prior treatment) HPI Natalie Thornton is a 27 y.o. female presents complaining about one week history of cramping sharp intermittent periumbilical abdominal pain and a decrease in appetite, she says is getting "worser and worser" she describes pain is moderate to severe. She had one episode of diarrhea 2 days ago that was her last bowel movement, she had one episode of vomiting yesterday as well. She has history of scoliosis for which she gets physical therapy for, she says she cannot be pregnant LMP was 3/22. Denies any vaginal discharge, vaginal complaints, dysuria, frequency, fever, chills.  Past Medical History  Diagnosis Date  . Pilonidal cyst     History reviewed. No pertinent past surgical history.  Family History  Problem Relation Age of Onset  . Cancer Maternal Grandmother     breast    History  Substance Use Topics  . Smoking status: Current Every Day Smoker -- 0.50 packs/day    Types: Cigarettes  . Smokeless tobacco: Not on file  . Alcohol Use: 1.2 oz/week    2 Shots of liquor per week     Comment: weekends    OB History   Grav Para Term Preterm Abortions TAB SAB Ect Mult Living                  Review of Systems  Constitutional: Positive for appetite change. Negative for fever and diaphoresis.  HENT: Positive for rhinorrhea. Negative for congestion, sore throat, sneezing, neck pain, neck stiffness and sinus pressure.   Eyes: Negative for pain, redness and visual disturbance.  Respiratory: Negative for cough and shortness of breath.   Cardiovascular: Negative for chest pain, palpitations and leg swelling.  Gastrointestinal: Positive for nausea, vomiting, abdominal pain, diarrhea, constipation and  abdominal distention. Negative for blood in stool and rectal pain.  Endocrine: Negative for polyphagia and polyuria.  Genitourinary: Negative for dysuria, urgency, frequency, flank pain, decreased urine volume, vaginal bleeding, menstrual problem and pelvic pain.       LMP 3/22  Musculoskeletal: Positive for back pain.       H/o scoliosis causing pain  Skin: Negative for color change and pallor.  Neurological: Negative for dizziness and headaches.  Hematological: Negative for adenopathy. Does not bruise/bleed easily.    Allergies  Review of patient's allergies indicates no known allergies.  Home Medications   Current Outpatient Rx  Name  Route  Sig  Dispense  Refill  . ibuprofen (ADVIL,MOTRIN) 200 MG tablet   Oral   Take 200 mg by mouth every 6 (six) hours as needed for pain.            BP 121/73  Pulse 69  Temp(Src) 99 F (37.2 C)  Resp 20  SpO2 100%  LMP 08/23/2012  Physical Exam  Nursing notes reviewed.  Electronic medical record reviewed. VITAL SIGNS:   Filed Vitals:   09/12/12 2206 09/13/12 0226  BP: 121/73 124/57  Pulse: 69 77  Temp: 99 F (37.2 C)   Resp: 20 18  SpO2: 100% 100%   CONSTITUTIONAL: Awake, oriented, appears non-toxic HENT: Atraumatic, normocephalic, oral mucosa pink and moist, airway patent. Nares patent without drainage. External ears normal. EYES: Conjunctiva clear, EOMI, PERRLA NECK: Trachea midline, non-tender, supple CARDIOVASCULAR: Normal heart rate,  Normal rhythm, No murmurs, rubs, gallops PULMONARY/CHEST: Clear to auscultation, no rhonchi, wheezes, or rales. Symmetrical breath sounds. Non-tender. ABDOMINAL: Non-distended, soft, non-tender - no rebound or guarding.  BS normal. NEUROLOGIC: Non-focal, moving all four extremities, no gross sensory or motor deficits. EXTREMITIES: No clubbing, cyanosis, or edema SKIN: Warm, Dry, No erythema, No rash  ED Course  Procedures (including critical care time)  Labs Reviewed  COMPREHENSIVE  METABOLIC PANEL - Abnormal; Notable for the following:    Sodium 131 (*)    Potassium 3.2 (*)    Total Bilirubin 0.2 (*)    GFR calc non Af Amer 90 (*)    All other components within normal limits  URINALYSIS, ROUTINE W REFLEX MICROSCOPIC - Abnormal; Notable for the following:    Specific Gravity, Urine 1.034 (*)    All other components within normal limits  CBC WITH DIFFERENTIAL  LIPASE, BLOOD  PREGNANCY, URINE   Dg Abd 2 Views  09/13/2012  *RADIOLOGY REPORT*  Clinical Data: Periumbilical abdominal pain for 1 week.  ABDOMEN - 2 VIEW  Comparison: None.  Findings: The visualized bowel gas pattern is unremarkable. Scattered air and stool filled loops of colon are seen; no abnormal dilatation of small bowel loops is seen to suggest small bowel obstruction.  No free intra-abdominal air is identified on the provided upright view.  The visualized osseous structures are within normal limits; the sacroiliac joints are unremarkable in appearance.  The visualized lung bases are essentially clear.  IMPRESSION: Unremarkable bowel gas pattern; no free intra-abdominal air seen.   Original Report Authenticated By: Tonia Ghent, M.D.      1. Colicky periumbilical abdominal pain       MDM  Patient presents with colicky periumbilical abdominal pain, my suspicion is very low for any acute intra-abdominal emergency at this time. I do not think this is an early appendicitis either. Patient is afebrile, nontoxic, sitting in bed with her friend in no apparent distress. Patient does have a history of constipation.  Labs unremarkable. X-ray is unremarkable, I do read some increased stool in the colon, thinking this is likely related to constipation or decreased stool transit at least a functional abdominal pain picture. On further history, patient admits to drinking in binges especially on the weekends and has been drinking more more recently. Do not think this is a gastritis or peptic ulcer disease, think is  likely related to some mild dehydration and functional abdominal pain secondary to stool transit.  Patient's abdomen is soft, nontender, benign, discharge her home with MiraLax and stool softener as well as some Ultram for discomfort.  I explained the diagnosis and have given explicit precautions to return to the ER including any other new or worsening symptoms. The patient understands and accepts the medical plan as it's been dictated and I have answered their questions. Discharge instructions concerning home care and prescriptions have been given.  The patient is STABLE and is discharged to home in good condition.         Jones Skene, MD 09/13/12 1025

## 2013-01-23 ENCOUNTER — Encounter (HOSPITAL_COMMUNITY): Payer: Self-pay

## 2013-01-23 ENCOUNTER — Emergency Department (HOSPITAL_COMMUNITY)
Admission: EM | Admit: 2013-01-23 | Discharge: 2013-01-24 | Disposition: A | Discharge status: Home / Self Care | Payer: Managed Care, Other (non HMO) | Attending: Emergency Medicine | Admitting: Emergency Medicine

## 2013-01-23 DIAGNOSIS — G8929 Other chronic pain: Secondary | ICD-10-CM

## 2013-01-23 DIAGNOSIS — M545 Low back pain, unspecified: Secondary | ICD-10-CM | POA: Insufficient documentation

## 2013-01-23 DIAGNOSIS — Z872 Personal history of diseases of the skin and subcutaneous tissue: Secondary | ICD-10-CM | POA: Insufficient documentation

## 2013-01-23 DIAGNOSIS — Z3202 Encounter for pregnancy test, result negative: Secondary | ICD-10-CM | POA: Insufficient documentation

## 2013-01-23 DIAGNOSIS — F17200 Nicotine dependence, unspecified, uncomplicated: Secondary | ICD-10-CM | POA: Insufficient documentation

## 2013-01-23 DIAGNOSIS — R52 Pain, unspecified: Secondary | ICD-10-CM | POA: Insufficient documentation

## 2013-01-23 NOTE — ED Notes (Signed)
Pt c/o chronic lower back pain for several years, pt reports she was seeing a PT but has not seen one in a while, no new injury or problems with bowel or bladder
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
## 2013-01-24 LAB — URINALYSIS, ROUTINE W REFLEX MICROSCOPIC
Glucose, UA: NEGATIVE mg/dL
Protein, ur: NEGATIVE mg/dL
Specific Gravity, Urine: 1.027 (ref 1.005–1.030)
Urobilinogen, UA: 1 mg/dL (ref 0.0–1.0)

## 2013-01-24 LAB — URINE MICROSCOPIC-ADD ON

## 2013-01-24 LAB — PREGNANCY, URINE: Preg Test, Ur: NEGATIVE

## 2013-01-24 MED ORDER — NAPROXEN 500 MG PO TABS: 500.0000 mg | ORAL_TABLET | Freq: Two times a day (BID) | ORAL | Status: DC

## 2013-01-24 MED ORDER — HYDROCODONE-ACETAMINOPHEN 5-325 MG PO TABS
1.0000 | ORAL_TABLET | Freq: Once | ORAL | Status: AC
  Administered 2013-01-24: 1 via ORAL
  Filled 2013-01-24: qty 1

## 2013-01-24 MED ORDER — PREDNISONE 20 MG PO TABS: ORAL_TABLET | ORAL | Status: DC

## 2013-01-24 MED ORDER — CYCLOBENZAPRINE HCL 10 MG PO TABS
10.0000 mg | ORAL_TABLET | Freq: Two times a day (BID) | ORAL | Status: DC | PRN
Start: 2013-01-24 — End: 2013-06-04

## 2013-01-24 NOTE — ED Provider Notes (Signed)
Medical screening examination/treatment/procedure(s) were performed by non-physician practitioner and as supervising physician I was immediately available for consultation/collaboration.  Damiean Lukes M Kelita Wallis, MD 01/24/13 0748 

## 2013-01-25 LAB — URINE CULTURE

## 2013-06-03 ENCOUNTER — Encounter (HOSPITAL_COMMUNITY): Payer: Self-pay | Admitting: Emergency Medicine

## 2013-06-03 ENCOUNTER — Emergency Department (HOSPITAL_COMMUNITY)
Admission: EM | Admit: 2013-06-03 | Discharge: 2013-06-04 | Disposition: A | Discharge status: Home / Self Care | Payer: Managed Care, Other (non HMO) | Attending: Emergency Medicine | Admitting: Emergency Medicine

## 2013-06-03 DIAGNOSIS — M545 Low back pain, unspecified: Secondary | ICD-10-CM | POA: Insufficient documentation

## 2013-06-03 DIAGNOSIS — F17200 Nicotine dependence, unspecified, uncomplicated: Secondary | ICD-10-CM | POA: Insufficient documentation

## 2013-06-03 DIAGNOSIS — M549 Dorsalgia, unspecified: Secondary | ICD-10-CM

## 2013-06-03 DIAGNOSIS — Z872 Personal history of diseases of the skin and subcutaneous tissue: Secondary | ICD-10-CM | POA: Insufficient documentation

## 2013-06-03 NOTE — ED Notes (Signed)
Pt reports lower back pain that has gotten worse since the beginning of the year. Pt has a chiropractor and does not take any pain meds to help with pain. Pian increased with movement and nothing seems to make it better.

## 2013-06-04 MED ORDER — METHOCARBAMOL 500 MG PO TABS: 500.0000 mg | ORAL_TABLET | Freq: Two times a day (BID) | ORAL | Status: DC

## 2013-06-04 MED ORDER — MELOXICAM 7.5 MG PO TABS: 15.0000 mg | ORAL_TABLET | Freq: Every day | ORAL | Status: DC

## 2013-06-04 MED ORDER — OXYCODONE-ACETAMINOPHEN 5-325 MG PO TABS
2.0000 | ORAL_TABLET | Freq: Once | ORAL | Status: AC
  Administered 2013-06-04: 2 via ORAL
  Filled 2013-06-04: qty 2

## 2013-06-04 NOTE — ED Provider Notes (Signed)
CSN: [id number]     Arrival date & time 06/03/13  2215 History   First MD Initiated Contact with Patient 06/04/13 0012     Chief Complaint  Patient presents with  . Back Pain   (Consider location/radiation/quality/duration/timing/severity/associated sxs/prior Treatment) HPI Comments: Patient is a 27 year old female who presents for low back pain x3 years. Patient states the pain has been worsening over the past year. She states the pain radiates up her back and is worse with movement. She denies any alleviating factors and has not taken any medications for her pain. She has not applied ice or use heat for her pain. Patient denies fevers, urinary symptoms, bowel or bladder incontinence, genital or perianal numbness, saddle anesthesia, and numbness/tingling or weakness. Patient denies a history of cancer or IV drug use as well as recent trauma or injury to her back.  Patient is a 27 y.o. female presenting with back pain. The history is provided by the patient. No language interpreter was used.  Back Pain   Past Medical History  Diagnosis Date  . Pilonidal cyst    History reviewed. No pertinent past surgical history. Family History  Problem Relation Age of Onset  . Cancer Maternal Grandmother     breast   History  Substance Use Topics  . Smoking status: Current Every Day Smoker -- 0.50 packs/day    Types: Cigarettes  . Smokeless tobacco: Not on file  . Alcohol Use: 1.2 oz/week    2 Shots of liquor per week     Comment: weekends   OB History   Grav Para Term Preterm Abortions TAB SAB Ect Mult Living                 Review of Systems  Musculoskeletal: Positive for back pain.    Allergies  Review of patient's allergies indicates no known allergies.  Home Medications   Current Outpatient Rx  Name  Route  Sig  Dispense  Refill  . meloxicam (MOBIC) 7.5 MG tablet   Oral   Take 2 tablets (15 mg total) by mouth daily.   30 tablet   0   . methocarbamol (ROBAXIN) 500 MG  tablet   Oral   Take 1 tablet (500 mg total) by mouth 2 (two) times daily.   20 tablet   0    BP 119/70  Pulse 92  Temp(Src) 98.3 F (36.8 C) (Oral)  Resp 15  Ht 4\' 11"  (1.499 m)  Wt 145 lb (65.772 kg)  BMI 29.27 kg/m2  SpO2 98%  LMP 05/31/2013  Physical Exam  Nursing note and vitals reviewed. Constitutional: She is oriented to person, place, and time. She appears well-developed and well-nourished. No distress.  HENT:  Head: Normocephalic and atraumatic.  Eyes: Conjunctivae and EOM are normal. No scleral icterus.  Neck: Normal range of motion.  Cardiovascular: Normal rate, regular rhythm and intact distal pulses.   Pulses:      Dorsalis pedis pulses are 2+ on the right side, and 2+ on the left side.       Posterior tibial pulses are 2+ on the right side, and 2+ on the left side.  Pulmonary/Chest: Effort normal. No respiratory distress.  Musculoskeletal:       Thoracic back: Normal.       Lumbar back: She exhibits decreased range of motion (with forward flexion) and tenderness. She exhibits no bony tenderness, no swelling, no deformity, no pain and no spasm.       Back:  Neurological: She  is alert and oriented to person, place, and time. She has normal reflexes.  No gross sensory deficits appreciated. Patellar and achilles reflexes 2+. Patient ambulatory with normal gait.   Skin: Skin is warm and dry. No rash noted. She is not diaphoretic. No erythema. No pallor.  Psychiatric: She has a normal mood and affect. Her behavior is normal.    ED Course  Procedures (including critical care time) Labs Review Labs Reviewed - No data to display  Imaging Review No results found.  EKG Interpretation   None       MDM   1. Back pain    Uncomplicated back pain. Patient neurovascularly intact and ambulatory. Patient denies any trauma or injury to her back. No red flags or signs concerning for cauda equina. Patient stable and appropriate for discharge with Mobic and Robaxin  for symptoms. Patient given referral to orthopedics for followup. Return precautions discussed and patient agreeable to plan with no unaddressed concerns.    Antony Madura, PA-C 06/09/13 2114

## 2013-06-10 NOTE — ED Provider Notes (Signed)
Medical screening examination/treatment/procedure(s) were performed by non-physician practitioner and as supervising physician I was immediately available for consultation/collaboration.  EKG Interpretation   None        Teressa Lower, MD 06/10/13 [phone]

## 2013-09-10 ENCOUNTER — Emergency Department (HOSPITAL_COMMUNITY)
Admission: EM | Admit: 2013-09-10 | Discharge: 2013-09-10 | Disposition: A | Discharge status: Home / Self Care | Payer: Managed Care, Other (non HMO) | Attending: Emergency Medicine | Admitting: Emergency Medicine

## 2013-09-10 ENCOUNTER — Encounter (HOSPITAL_COMMUNITY): Payer: Self-pay | Admitting: Emergency Medicine

## 2013-09-10 DIAGNOSIS — Z872 Personal history of diseases of the skin and subcutaneous tissue: Secondary | ICD-10-CM | POA: Insufficient documentation

## 2013-09-10 DIAGNOSIS — F17200 Nicotine dependence, unspecified, uncomplicated: Secondary | ICD-10-CM | POA: Insufficient documentation

## 2013-09-10 DIAGNOSIS — S0990XA Unspecified injury of head, initial encounter: Secondary | ICD-10-CM | POA: Insufficient documentation

## 2013-09-10 DIAGNOSIS — Y92410 Unspecified street and highway as the place of occurrence of the external cause: Secondary | ICD-10-CM | POA: Insufficient documentation

## 2013-09-10 DIAGNOSIS — G8929 Other chronic pain: Secondary | ICD-10-CM | POA: Insufficient documentation

## 2013-09-10 DIAGNOSIS — IMO0002 Reserved for concepts with insufficient information to code with codable children: Secondary | ICD-10-CM | POA: Insufficient documentation

## 2013-09-10 DIAGNOSIS — Z8739 Personal history of other diseases of the musculoskeletal system and connective tissue: Secondary | ICD-10-CM | POA: Insufficient documentation

## 2013-09-10 DIAGNOSIS — T148XXA Other injury of unspecified body region, initial encounter: Secondary | ICD-10-CM

## 2013-09-10 DIAGNOSIS — Y9389 Activity, other specified: Secondary | ICD-10-CM | POA: Insufficient documentation

## 2013-09-10 DIAGNOSIS — R110 Nausea: Secondary | ICD-10-CM | POA: Insufficient documentation

## 2013-09-10 DIAGNOSIS — S199XXA Unspecified injury of neck, initial encounter: Secondary | ICD-10-CM

## 2013-09-10 DIAGNOSIS — S0993XA Unspecified injury of face, initial encounter: Secondary | ICD-10-CM | POA: Insufficient documentation

## 2013-09-10 HISTORY — DX: Reserved for concepts with insufficient information to code with codable children: IMO0002

## 2013-09-10 HISTORY — DX: Unspecified osteoarthritis, unspecified site: M19.90

## 2013-09-10 MED ORDER — METHOCARBAMOL 500 MG PO TABS: 1000.0000 mg | ORAL_TABLET | Freq: Four times a day (QID) | ORAL | Status: DC

## 2013-09-10 MED ORDER — NAPROXEN 500 MG PO TABS: 500.0000 mg | ORAL_TABLET | Freq: Two times a day (BID) | ORAL | Status: DC

## 2013-09-10 MED ORDER — NAPROXEN 500 MG PO TABS
500.0000 mg | ORAL_TABLET | Freq: Once | ORAL | Status: AC
  Administered 2013-09-10: 500 mg via ORAL
  Filled 2013-09-10: qty 1

## 2013-09-10 MED ORDER — METHOCARBAMOL 500 MG PO TABS
500.0000 mg | ORAL_TABLET | Freq: Once | ORAL | Status: AC
  Administered 2013-09-10: 500 mg via ORAL
  Filled 2013-09-10: qty 1
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
## 2013-09-13 NOTE — ED Provider Notes (Signed)
Medical screening examination/treatment/procedure(s) were performed by non-physician practitioner and as supervising physician I was immediately available for consultation/collaboration.   EKG Interpretation None       Nat Christen, MD 09/13/13 2011

## 2013-09-16 ENCOUNTER — Other Ambulatory Visit (HOSPITAL_COMMUNITY): Payer: Self-pay | Admitting: Internal Medicine

## 2013-09-16 DIAGNOSIS — M5137 Other intervertebral disc degeneration, lumbosacral region: Secondary | ICD-10-CM

## 2013-10-01 ENCOUNTER — Ambulatory Visit (HOSPITAL_COMMUNITY): Admission: RE | Admit: 2013-10-01 | Payer: Managed Care, Other (non HMO) | Source: Ambulatory Visit

## 2013-12-11 ENCOUNTER — Encounter (HOSPITAL_COMMUNITY): Payer: Self-pay | Admitting: Emergency Medicine

## 2013-12-11 ENCOUNTER — Emergency Department (HOSPITAL_COMMUNITY)
Admission: EM | Admit: 2013-12-11 | Discharge: 2013-12-11 | Disposition: A | Discharge status: Home / Self Care | Payer: Managed Care, Other (non HMO) | Attending: Emergency Medicine | Admitting: Emergency Medicine

## 2013-12-11 DIAGNOSIS — Z872 Personal history of diseases of the skin and subcutaneous tissue: Secondary | ICD-10-CM | POA: Insufficient documentation

## 2013-12-11 DIAGNOSIS — M129 Arthropathy, unspecified: Secondary | ICD-10-CM | POA: Insufficient documentation

## 2013-12-11 DIAGNOSIS — F17200 Nicotine dependence, unspecified, uncomplicated: Secondary | ICD-10-CM | POA: Insufficient documentation

## 2013-12-11 DIAGNOSIS — M25559 Pain in unspecified hip: Secondary | ICD-10-CM | POA: Insufficient documentation

## 2013-12-11 DIAGNOSIS — R079 Chest pain, unspecified: Secondary | ICD-10-CM | POA: Insufficient documentation

## 2013-12-11 DIAGNOSIS — Z791 Long term (current) use of non-steroidal anti-inflammatories (NSAID): Secondary | ICD-10-CM | POA: Insufficient documentation

## 2013-12-11 DIAGNOSIS — M791 Myalgia, unspecified site: Secondary | ICD-10-CM

## 2013-12-11 MED ORDER — HYDROCODONE-ACETAMINOPHEN 5-325 MG PO TABS: 1.0000 | ORAL_TABLET | ORAL | Status: DC | PRN

## 2013-12-11 MED ORDER — IBUPROFEN 800 MG PO TABS: 800.0000 mg | ORAL_TABLET | Freq: Three times a day (TID) | ORAL | Status: DC

## 2013-12-11 MED ORDER — HYDROCODONE-ACETAMINOPHEN 5-325 MG PO TABS
1.0000 | ORAL_TABLET | Freq: Once | ORAL | Status: AC
  Administered 2013-12-11: 1 via ORAL
  Filled 2013-12-11: qty 1

## 2013-12-11 NOTE — ED Provider Notes (Signed)
Medical screening examination/treatment/procedure(s) were performed by non-physician practitioner and as supervising physician I was immediately available for consultation/collaboration.   EKG Interpretation None       Natalie Thornton. Alvino Chapel, MD 12/11/13 2352

## 2013-12-11 NOTE — Discharge Instructions (Signed)
Cryotherapy Cryotherapy means treatment with cold. Ice or gel packs can be used to reduce both pain and swelling. Ice is the most helpful within the first 24 to 48 hours after an injury or flareup from overusing a muscle or joint. Sprains, strains, spasms, burning pain, shooting pain, and aches can all be eased with ice. Ice can also be used when recovering from surgery. Ice is effective, has very few side effects, and is safe for most people to use. PRECAUTIONS  Ice is not a safe treatment option for people with:  Raynaud's phenomenon. This is a condition affecting small blood vessels in the extremities. Exposure to cold may cause your problems to return.  Cold hypersensitivity. There are many forms of cold hypersensitivity, including:  Cold urticaria. Red, itchy hives appear on the skin when the tissues begin to warm after being iced.  Cold erythema. This is a red, itchy rash caused by exposure to cold.  Cold hemoglobinuria. Red blood cells break down when the tissues begin to warm after being iced. The hemoglobin that carry oxygen are passed into the urine because they cannot combine with blood proteins fast enough.  Numbness or altered sensitivity in the area being iced. If you have any of the following conditions, do not use ice until you have discussed cryotherapy with your caregiver:  Heart conditions, such as arrhythmia, angina, or chronic heart disease.  High blood pressure.  Healing wounds or open skin in the area being iced.  Current infections.  Rheumatoid arthritis.  Poor circulation.  Diabetes. Ice slows the blood flow in the region it is applied. This is beneficial when trying to stop inflamed tissues from spreading irritating chemicals to surrounding tissues. However, if you expose your skin to cold temperatures for too long or without the proper protection, you can damage your skin or nerves. Watch for signs of skin damage due to cold. HOME CARE INSTRUCTIONS Follow  these tips to use ice and cold packs safely.  Place a dry or damp towel between the ice and skin. A damp towel will cool the skin more quickly, so you may need to shorten the time that the ice is used.  For a more rapid response, add gentle compression to the ice.  Ice for no more than 10 to 20 minutes at a time. The bonier the area you are icing, the less time it will take to get the benefits of ice.  Check your skin after 5 minutes to make sure there are no signs of a poor response to cold or skin damage.  Rest 20 minutes or more in between uses.  Once your skin is numb, you can end your treatment. You can test numbness by very lightly touching your skin. The touch should be so light that you do not see the skin dimple from the pressure of your fingertip. When using ice, most people will feel these normal sensations in this order: cold, burning, aching, and numbness.  Do not use ice on someone who cannot communicate their responses to pain, such as small children or people with dementia. HOW TO MAKE AN ICE PACK Ice packs are the most common way to use ice therapy. Other methods include ice massage, ice baths, and cryo-sprays. Muscle creams that cause a cold, tingly feeling do not offer the same benefits that ice offers and should not be used as a substitute unless recommended by your caregiver. To make an ice pack, do one of the following:  Place crushed ice or  a bag of frozen vegetables in a sealable plastic bag. Squeeze out the excess air. Place this bag inside another plastic bag. Slide the bag into a pillowcase or place a damp towel between your skin and the bag.  Mix 3 parts water with 1 part rubbing alcohol. Freeze the mixture in a sealable plastic bag. When you remove the mixture from the freezer, it will be slushy. Squeeze out the excess air. Place this bag inside another plastic bag. Slide the bag into a pillowcase or place a damp towel between your skin and the bag. SEEK MEDICAL  CARE IF:  You develop white spots on your skin. This may give the skin a blotchy (mottled) appearance.  Your skin turns blue or pale.  Your skin becomes waxy or hard.  Your swelling gets worse. MAKE SURE YOU:   Understand these instructions.  Will watch your condition.  Will get help right away if you are not doing well or get worse. Document Released: 01/17/2011 Document Revised: 08/15/2011 Document Reviewed: 01/17/2011 [facility] Patient Information 2015 [place], [place]. This information is not intended to replace advice given to you by your health care provider. Make sure you discuss any questions you have with your health care provider. Muscle Pain Muscle pain (myalgia) may be caused by many things, including:  Overuse or muscle strain, especially if you are not in shape. This is the most common cause of muscle pain.  Injury.  Bruises.  Viruses, such as the flu.  Infectious diseases.  Fibromyalgia, which is a chronic condition that causes muscle tenderness, fatigue, and headache.  Autoimmune diseases, including lupus.  Certain drugs, including ACE inhibitors and statins. Muscle pain may be mild or severe. In most cases, the pain lasts only a short time and goes away without treatment. To diagnose the cause of your muscle pain, your health care provider will take your medical history. This means he or she will ask you when your muscle pain began and what has been happening. If you have not had muscle pain for very long, your health care provider may want to wait before doing much testing. If your muscle pain has lasted a long time, your health care provider may want to run tests right away. If your health care provider thinks your muscle pain may be caused by illness, you may need to have additional tests to rule out certain conditions.  Treatment for muscle pain depends on the cause. Home care is often enough to relieve muscle pain. Your health care provider may also prescribe  anti-inflammatory medicine. HOME CARE INSTRUCTIONS Watch your condition for any changes. The following actions may help to lessen any discomfort you are feeling:  Only take over-the-counter or prescription medicines as directed by your health care provider.  Apply ice to the sore muscle:  Put ice in a plastic bag.  Place a towel between your skin and the bag.  Leave the ice on for 15-20 minutes, 3-4 times a day.  You may alternate applying hot and cold packs to the muscle as directed by your health care provider.  If overuse is causing your muscle pain, slow down your activities until the pain goes away.  Remember that it is normal to feel some muscle pain after starting a workout program. Muscles that have not been used often will be sore at first.  Do regular, gentle exercises if you are not usually active.  Warm up before exercising to lower your risk of muscle pain.  Do not continue working  out if the pain is very bad. Bad pain could mean you have injured a muscle. SEEK MEDICAL CARE IF:  Your muscle pain gets worse, and medicines do not help.  You have muscle pain that lasts longer than 3 days.  You have a rash or fever along with muscle pain.  You have muscle pain after a tick bite.  You have muscle pain while working out, even though you are in good physical condition.  You have redness, soreness, or swelling along with muscle pain.  You have muscle pain after starting a new medicine or changing the dose of a medicine. SEEK IMMEDIATE MEDICAL CARE IF:  You have trouble breathing.  You have trouble swallowing.  You have muscle pain along with a stiff neck, fever, and vomiting.  You have severe muscle weakness or cannot move part of your body. MAKE SURE YOU:   Understand these instructions.  Will watch your condition.  Will get help right away if you are not doing well or get worse. Document Released: 04/14/2006 Document Revised: 05/28/2013 Document  Reviewed: 03/19/2013 [facility] Patient Information 2015 [place], [place]. This information is not intended to replace advice given to you by your health care provider. Make sure you discuss any questions you have with your health care provider.

## 2013-12-11 NOTE — ED Notes (Signed)
Pt has a ride home.  

## 2013-12-11 NOTE — ED Notes (Signed)
Pt c/o pain to back of L thigh since yesterday. Pt states she woke up with the pain. Pt denies trauma. Pt also c/o R side rib cage pain since yesterday morning. No trauma. Pt ambulatory to exam room with steady gait.

## 2013-12-11 NOTE — ED Provider Notes (Signed)
CSN: [id number]     Arrival date & time 12/11/13  2119 History  This chart was scribed for non-physician practitioner, Charlann Lange, PA-C,working with Jasper Riling. Alvino Chapel, MD, by Marlowe Kays, ED Scribe.  This patient was seen in room [place] and the patient's care was started at 9:34 PM.  Chief Complaint  Patient presents with  . Leg Pain  . Ribcage Pain    The history is provided by the patient. No language interpreter was used.   HPI Comments:  Natalie Thornton is a 28 y.o. female who presents to the Emergency Department complaining of worsening right rib pain and left thigh pain upon waking yesterday morning. She states the ribcage pain worsens with laughing, coughing, or any other movements. She states the left thigh pain worsens with movement as well. She states she has been walking with a limp since yesterday secondary to pain. Pt reports soaking in an epsom salt bath. She denies any SOB, CP, urinary symptoms, abdominal pain, nausea, vomiting, diarrhea, fever, bowel changes. She denies any recent travel or oral contraceptive or hormone use. She denies injury, trauma, or fall. Pt is a smoker.  Past Medical History  Diagnosis Date  . Pilonidal cyst   . DDD (degenerative disc disease)   . Arthritis    History reviewed. No pertinent past surgical history. Family History  Problem Relation Age of Onset  . Cancer Maternal Grandmother     breast   History  Substance Use Topics  . Smoking status: Current Every Day Smoker -- 0.50 packs/day    Types: Cigarettes  . Smokeless tobacco: Not on file  . Alcohol Use: 1.2 oz/week    2 Shots of liquor per week     Comment: weekends   OB History   Grav Para Term Preterm Abortions TAB SAB Ect Mult Living                 Review of Systems  Constitutional: Negative for fever.  Respiratory: Negative for shortness of breath.   Cardiovascular: Negative for chest pain.  Gastrointestinal: Negative for nausea, vomiting, abdominal pain and  diarrhea.  Genitourinary: Negative for dysuria and difficulty urinating.  Musculoskeletal: Positive for myalgias.  All other systems reviewed and are negative.   Allergies  Review of patient's allergies indicates no known allergies.  Home Medications   Prior to Admission medications   Medication Sig Start Date End Date Taking? Authorizing Provider  naproxen (NAPROSYN) 500 MG tablet Take 1 tablet (500 mg total) by mouth 2 (two) times daily. 09/10/13  Yes Carlisle Cater, PA-C   Triage Vitals: BP 130/73  Pulse 95  Temp(Src) 99.2 F (37.3 C) (Oral)  Resp 16  SpO2 100% Physical Exam  Nursing note and vitals reviewed. Constitutional: She is oriented to person, place, and time. She appears well-developed and well-nourished.  HENT:  Head: Normocephalic and atraumatic.  Eyes: EOM are normal.  Neck: Normal range of motion.  Cardiovascular: Normal rate.   Distal pulses intact.  Pulmonary/Chest: Effort normal.  Abdominal:  No specific abdominal tenderness notably, no RLQ or RUQ tenderness.  Genitourinary:  No CVA tenderness.  Musculoskeletal: Normal range of motion.  Tenderness to right lateral chest and abdominal wall without swelling or discoloration. LLE without swelling or redness. Posterior thigh tenderness without mass. Full ROM of entire extremity. No knee or calf pain or tenderness. Fully weight bearing with ambulation.  Neurological: She is alert and oriented to person, place, and time.  Skin: Skin is warm and dry.  Psychiatric: She has a normal mood and affect. Her behavior is normal.    ED Course  Procedures (including critical care time) DIAGNOSTIC STUDIES: Oxygen Saturation is 100% on RA, normal by my interpretation.   COORDINATION OF CARE: 9:40 PM- Will treat pain. Advised pt to return with any different or worsening symptoms.  Pt verbalizes understanding and agrees to plan.  Medications - No data to display  Labs Review Labs Reviewed - No data to  display  Imaging Review No results found.   EKG Interpretation None      MDM   Final diagnoses:  None    1. Muscle pain  No concern for abdominal origin or pain - worse with movement, no N, V, D, dysuria. Normal vital signs. She appears well. No concern for DVT of left leg - no swelling, redness, pain worse with movement. Will treat with pain relief, anti-inflammatories. Return precautions given.   I personally performed the services described in this documentation, which was scribed in my presence. The recorded information has been reviewed and is accurate.    Dewaine Oats, PA-C 12/11/13 2215  Dewaine Oats, PA-C 12/11/13 2227

## 2014-03-15 ENCOUNTER — Encounter (HOSPITAL_COMMUNITY): Payer: Self-pay | Admitting: Emergency Medicine

## 2014-03-15 ENCOUNTER — Emergency Department (INDEPENDENT_AMBULATORY_CARE_PROVIDER_SITE_OTHER)
Admission: EM | Admit: 2014-03-15 | Discharge: 2014-03-15 | Disposition: A | Discharge status: Home / Self Care | Payer: Self-pay | Source: Home / Self Care

## 2014-03-15 DIAGNOSIS — J029 Acute pharyngitis, unspecified: Secondary | ICD-10-CM

## 2014-03-15 LAB — POCT RAPID STREP A: Streptococcus, Group A Screen (Direct): NEGATIVE

## 2014-03-15 NOTE — ED Provider Notes (Signed)
CSN: [id number]     Arrival date & time 03/15/14  1539 History   First MD Initiated Contact with Patient 03/15/14 1609     Chief Complaint  Patient presents with  . Sore Throat   (Consider location/radiation/quality/duration/timing/severity/associated sxs/prior Treatment) HPI Comments: C/O sore throat for 4 d.    Past Medical History  Diagnosis Date  . Pilonidal cyst   . DDD (degenerative disc disease)   . Arthritis    History reviewed. No pertinent past surgical history. Family History  Problem Relation Age of Onset  . Cancer Maternal Grandmother     breast   History  Substance Use Topics  . Smoking status: Current Every Day Smoker -- 0.50 packs/day    Types: Cigarettes  . Smokeless tobacco: Not on file  . Alcohol Use: 1.2 oz/week    2 Shots of liquor per week     Comment: weekends   OB History   Grav Para Term Preterm Abortions TAB SAB Ect Mult Living                 Review of Systems  Constitutional: Negative.  Negative for fever and activity change.  HENT: Positive for sore throat. Negative for congestion, facial swelling, postnasal drip and rhinorrhea.   Eyes: Negative.   Respiratory: Negative for cough and shortness of breath.   Cardiovascular: Negative for chest pain and leg swelling.  Gastrointestinal: Negative.     Allergies  Review of patient's allergies indicates no known allergies.  Home Medications   Prior to Admission medications   Medication Sig Start Date End Date Taking? Authorizing Provider  ibuprofen (ADVIL,MOTRIN) 800 MG tablet Take 1 tablet (800 mg total) by mouth 3 (three) times daily. 12/11/13   Shari A Upstill, PA-C  naproxen (NAPROSYN) 500 MG tablet Take 1 tablet (500 mg total) by mouth 2 (two) times daily. 09/10/13   Carlisle Cater, PA-C   BP 115/60  Pulse 78  Temp(Src) 99.7 F (37.6 C) (Oral)  Resp 16  SpO2 99% Physical Exam  Nursing note and vitals reviewed. Constitutional: She is oriented to person, place, and time. She appears  well-developed and well-nourished. No distress.  HENT:  bilat TM's nl OP with erythema and minor swelling. Airway widely patent.   Eyes: Conjunctivae and EOM are normal.  Neck: Normal range of motion. Neck supple.  Cardiovascular: Normal rate, regular rhythm and normal heart sounds.   Pulmonary/Chest: Breath sounds normal. She is in respiratory distress. She has no wheezes. She has no rales.  Lymphadenopathy:    She has no cervical adenopathy.  Neurological: She is alert and oriented to person, place, and time.  Skin: Skin is warm and dry.  Psychiatric: She has a normal mood and affect.    ED Course  Procedures (including critical care time) Labs Review Labs Reviewed  POCT RAPID STREP A ([facility]RG CARE ONLY)    Imaging Review No results found.   MDM   1. Pharyngitis     Ibuprofen 600 mg q 6h prn Lots of liquids Cepacol lozenges Culture pending    Janne Napoleon, NP 03/15/14 1704

## 2014-03-15 NOTE — Discharge Instructions (Signed)
Pharyngitis °Pharyngitis is redness, pain, and swelling (inflammation) of your pharynx.  °CAUSES  °Pharyngitis is usually caused by infection. Most of the time, these infections are from viruses (viral) and are part of a cold. However, sometimes pharyngitis is caused by bacteria (bacterial). Pharyngitis can also be caused by allergies. Viral pharyngitis may be spread from person to person by coughing, sneezing, and personal items or utensils (cups, forks, spoons, toothbrushes). Bacterial pharyngitis may be spread from person to person by more intimate contact, such as kissing.  °SIGNS AND SYMPTOMS  °Symptoms of pharyngitis include:   °· Sore throat.   °· Tiredness (fatigue).   °· Low-grade fever.   °· Headache. °· Joint pain and muscle aches. °· Skin rashes. °· Swollen lymph nodes. °· Plaque-like film on throat or tonsils (often seen with bacterial pharyngitis). °DIAGNOSIS  °Your health care provider will ask you questions about your illness and your symptoms. Your medical history, along with a physical exam, is often all that is needed to diagnose pharyngitis. Sometimes, a rapid strep test is done. Other lab tests may also be done, depending on the suspected cause.  °TREATMENT  °Viral pharyngitis will usually get better in 3-4 days without the use of medicine. Bacterial pharyngitis is treated with medicines that kill germs (antibiotics).  °HOME CARE INSTRUCTIONS  °· Drink enough water and fluids to keep your urine clear or pale yellow.   °· Only take over-the-counter or prescription medicines as directed by your health care provider:   °· If you are prescribed antibiotics, make sure you finish them even if you start to feel better.   °· Do not take aspirin.   °· Get lots of rest.   °· Gargle with 8 oz of salt water (½ tsp of salt per 1 qt of water) as often as every 1-2 hours to soothe your throat.   °· Throat lozenges (if you are not at risk for choking) or sprays may be used to soothe your throat. °SEEK MEDICAL  CARE IF:  °· You have large, tender lumps in your neck. °· You have a rash. °· You cough up green, yellow-brown, or bloody spit. °SEEK IMMEDIATE MEDICAL CARE IF:  °· Your neck becomes stiff. °· You drool or are unable to swallow liquids. °· You vomit or are unable to keep medicines or liquids down. °· You have severe pain that does not go away with the use of recommended medicines. °· You have trouble breathing (not caused by a stuffy nose). °MAKE SURE YOU:  °· Understand these instructions. °· Will watch your condition. °· Will get help right away if you are not doing well or get worse. °Document Released: 05/23/2005 Document Revised: 03/13/2013 Document Reviewed: 01/28/2013 °[employer]® Patient Information ©2015 [employer]. This information is not intended to replace advice given to you by your health care provider. Make sure you discuss any questions you have with your health care provider. ° °Sore Throat °A sore throat is pain, burning, irritation, or scratchiness of the throat. There is often pain or tenderness when swallowing or talking. A sore throat may be accompanied by other symptoms, such as coughing, sneezing, fever, and swollen neck glands. A sore throat is often the first sign of another sickness, such as a cold, flu, strep throat, or mononucleosis (commonly known as mono). Most sore throats go away without medical treatment. °CAUSES  °The most common causes of a sore throat include: °· A viral infection, such as a cold, flu, or mono. °· A bacterial infection, such as strep throat, tonsillitis, or whooping cough. °·   Seasonal allergies.  Dryness in the air.  Irritants, such as smoke or pollution.  Gastroesophageal reflux disease (GERD). HOME CARE INSTRUCTIONS   Only take over-the-counter medicines as directed by your caregiver.  Drink enough fluids to keep your urine clear or pale yellow.  Rest as needed.  Try using throat sprays, lozenges, or sucking on hard candy to ease any pain (if  older than 4 years or as directed).  Sip warm liquids, such as broth, herbal tea, or warm water with honey to relieve pain temporarily. You may also eat or drink cold or frozen liquids such as frozen ice pops.  Gargle with salt water (mix 1 tsp salt with 8 oz of water).  Do not smoke and avoid secondhand smoke.  Put a cool-mist humidifier in your bedroom at night to moisten the air. You can also turn on a hot shower and sit in the bathroom with the door closed for 5-10 minutes. SEEK IMMEDIATE MEDICAL CARE IF:  You have difficulty breathing.  You are unable to swallow fluids, soft foods, or your saliva.  You have increased swelling in the throat.  Your sore throat does not get better in 7 days.  You have nausea and vomiting.  You have a fever or persistent symptoms for more than 2-3 days.  You have a fever and your symptoms suddenly get worse. MAKE SURE YOU:   Understand these instructions.  Will watch your condition.  Will get help right away if you are not doing well or get worse. Document Released: 06/30/2004 Document Revised: 05/09/2012 Document Reviewed: 01/29/2012 [facility] Patient Information 2015 [place], [place]. This information is not intended to replace advice given to you by your health care provider. Make sure you discuss any questions you have with your health care provider.

## 2014-03-15 NOTE — ED Notes (Signed)
Please call patient on cell # for any lab issues

## 2014-03-15 NOTE — ED Provider Notes (Signed)
Medical screening examination/treatment/procedure(s) were performed by non-physician practitioner and as supervising physician I was immediately available for consultation/collaboration.  Philipp Deputy, M.D.  Harden Mo, MD 03/15/14 [phone]

## 2014-03-15 NOTE — ED Notes (Signed)
C/o pain w swallowing since yesterday. No relief w OTC medications

## 2014-03-17 ENCOUNTER — Telehealth (HOSPITAL_COMMUNITY): Payer: Self-pay | Admitting: *Deleted

## 2014-03-17 LAB — CULTURE, GROUP A STREP

## 2014-03-17 MED ORDER — AMOXICILLIN 500 MG PO CAPS: 500.0000 mg | ORAL_CAPSULE | Freq: Three times a day (TID) | ORAL | Status: DC

## 2014-03-17 NOTE — ED Notes (Signed)
Pt. called on VM for lab results. I called pt. back.  Pt. verified x 2 and given result. Throat culture: Strep beta hemolytic not group A.  Pt. said she has been sick for 6 days. I told her I would talk to Dr. Jake Michaelis and call back. Roselyn Meier 03/17/2014

## 2014-03-17 NOTE — ED Notes (Signed)
Patient's throat culture came back positive for non-group A strep.  He was not treated.  Will treat with amoxicillin 500 mg #15 1 TID.    Harden Mo, MD 03/17/14 [phone]

## 2014-03-19 ENCOUNTER — Telehealth (HOSPITAL_COMMUNITY): Payer: Self-pay

## 2014-03-19 NOTE — ED Notes (Signed)
I called pt. Tt make sure she had picked up her Rx. She said has not, because she had some at home she was taking. She was not able to tell me the dose or how many she has taken.  She said she was feeling better.  I strongly recommended she pick up this Rx. and take all as directed. Pt. instructed that it could come back if not adequately treated. Pt. Told where to pick up the Rx.  Pt. voiced understanding.

## 2014-04-14 ENCOUNTER — Emergency Department (HOSPITAL_COMMUNITY): Payer: Self-pay

## 2014-04-14 ENCOUNTER — Emergency Department (HOSPITAL_COMMUNITY)
Admission: EM | Admit: 2014-04-14 | Discharge: 2014-04-15 | Disposition: A | Discharge status: Home / Self Care | Payer: Self-pay | Attending: Emergency Medicine | Admitting: Emergency Medicine

## 2014-04-14 ENCOUNTER — Encounter (HOSPITAL_COMMUNITY): Payer: Self-pay | Admitting: Emergency Medicine

## 2014-04-14 DIAGNOSIS — Z720 Tobacco use: Secondary | ICD-10-CM | POA: Insufficient documentation

## 2014-04-14 DIAGNOSIS — Z872 Personal history of diseases of the skin and subcutaneous tissue: Secondary | ICD-10-CM | POA: Insufficient documentation

## 2014-04-14 DIAGNOSIS — M1990 Unspecified osteoarthritis, unspecified site: Secondary | ICD-10-CM | POA: Insufficient documentation

## 2014-04-14 DIAGNOSIS — K5900 Constipation, unspecified: Secondary | ICD-10-CM

## 2014-04-14 DIAGNOSIS — Z792 Long term (current) use of antibiotics: Secondary | ICD-10-CM | POA: Insufficient documentation

## 2014-04-14 DIAGNOSIS — Z791 Long term (current) use of non-steroidal anti-inflammatories (NSAID): Secondary | ICD-10-CM | POA: Insufficient documentation

## 2014-04-14 DIAGNOSIS — R110 Nausea: Secondary | ICD-10-CM | POA: Insufficient documentation

## 2014-04-14 DIAGNOSIS — Z3202 Encounter for pregnancy test, result negative: Secondary | ICD-10-CM | POA: Insufficient documentation

## 2014-04-14 DIAGNOSIS — R109 Unspecified abdominal pain: Secondary | ICD-10-CM

## 2014-04-14 LAB — PREGNANCY, URINE: Preg Test, Ur: NEGATIVE

## 2014-04-14 NOTE — ED Provider Notes (Signed)
CSN: [id number]     Arrival date & time 04/14/14  2315 History   First MD Initiated Contact with Patient 04/14/14 2329     Chief Complaint  Patient presents with  . Abdominal Pain     (Consider location/radiation/quality/duration/timing/severity/associated sxs/prior Treatment) HPI Patient presents with episodic diffuse abdominal pain for the past month. Does not radiate. No known exacerbating factors. He has mild associated nausea. Also complains of constipation. Last bowel movement was 2 days ago. No diarrhea or blood in the stool. Denies any recent NSAID use. No fever or chills. Denies urinary symptoms. No vaginal bleeding or discharge. No previous abdominal surgeries. Patient states she started having diffuse abdominal pain earlier in the day. Past Medical History  Diagnosis Date  . Pilonidal cyst   . DDD (degenerative disc disease)   . Arthritis    History reviewed. No pertinent past surgical history. Family History  Problem Relation Age of Onset  . Cancer Maternal Grandmother     breast   History  Substance Use Topics  . Smoking status: Current Every Day Smoker -- 0.50 packs/day    Types: Cigarettes  . Smokeless tobacco: Not on file  . Alcohol Use: 1.2 oz/week    2 Shots of liquor per week     Comment: weekends   OB History    No data available     Review of Systems  Constitutional: Negative for fever and chills.  Respiratory: Negative for cough and shortness of breath.   Cardiovascular: Negative for chest pain.  Gastrointestinal: Positive for nausea, abdominal pain and constipation. Negative for vomiting, diarrhea and blood in stool.  Genitourinary: Negative for dysuria, frequency, flank pain, vaginal bleeding, vaginal discharge and pelvic pain.  Musculoskeletal: Negative for myalgias, back pain, neck pain and neck stiffness.  Skin: Negative for rash and wound.  Neurological: Negative for dizziness, weakness, numbness and headaches.  All other systems reviewed and  are negative.     Allergies  Review of patient's allergies indicates no known allergies.  Home Medications   Prior to Admission medications   Medication Sig Start Date End Date Taking? Authorizing Provider  ibuprofen (ADVIL,MOTRIN) 800 MG tablet Take 1 tablet (800 mg total) by mouth 3 (three) times daily. 12/11/13  Yes Shari A Upstill, PA-C  amoxicillin (AMOXIL) 500 MG capsule Take 1 capsule (500 mg total) by mouth 3 (three) times daily. 03/17/14   Harden Mo, MD  naproxen (NAPROSYN) 500 MG tablet Take 1 tablet (500 mg total) by mouth 2 (two) times daily. 09/10/13   Carlisle Cater, PA-C   BP 132/79 mmHg  Pulse 72  Temp(Src) 98.1 F (36.7 C) (Oral)  Resp 16  Ht 4\' 11"  (1.499 m)  Wt 150 lb (68.04 kg)  BMI 30.28 kg/m2  SpO2 100%  LMP  Physical Exam  Constitutional: She is oriented to person, place, and time. She appears well-developed and well-nourished. No distress.  HENT:  Head: Normocephalic and atraumatic.  Mouth/Throat: Oropharynx is clear and moist.  Eyes: EOM are normal. Pupils are equal, round, and reactive to light.  Neck: Normal range of motion. Neck supple.  Cardiovascular: Normal rate and regular rhythm.   Pulmonary/Chest: Effort normal and breath sounds normal. No respiratory distress. She has no wheezes. She has no rales.  Abdominal: Soft. Bowel sounds are normal. She exhibits distension (mild diffuse abdominal distention.). She exhibits no mass. There is tenderness (mild diffuse tenderness with palpation.). There is no rebound and no guarding.  Musculoskeletal: Normal range of motion. She exhibits  no edema or tenderness.  No CVA tenderness bilaterally.  Neurological: She is alert and oriented to person, place, and time.  Skin: Skin is warm and dry. No rash noted. No erythema.  Psychiatric: She has a normal mood and affect. Her behavior is normal.  Nursing note and vitals reviewed.   ED Course  Procedures (including critical care time) Labs Review Labs  Reviewed  COMPREHENSIVE METABOLIC PANEL - Abnormal; Notable for the following:    Sodium 136 (*)    GFR calc non Af Amer 81 (*)    All other components within normal limits  URINALYSIS, ROUTINE W REFLEX MICROSCOPIC - Abnormal; Notable for the following:    APPearance CLOUDY (*)    Leukocytes, UA SMALL (*)    All other components within normal limits  URINE MICROSCOPIC-ADD ON - Abnormal; Notable for the following:    Squamous Epithelial / LPF FEW (*)    Bacteria, UA FEW (*)    All other components within normal limits  CBC WITH DIFFERENTIAL  LIPASE, BLOOD  PREGNANCY, URINE    Imaging Review Dg Abd 1 View  04/15/2014   CLINICAL DATA:  Upper abdominal pain intermittently for 1 month.  EXAM: ABDOMEN - 1 VIEW  COMPARISON:  Two views of the abdomen 09/13/2012.  FINDINGS: The bowel gas pattern is normal. No radio-opaque calculi or other significant radiographic abnormality are seen.  IMPRESSION: Negative.   Electronically Signed   By: Inge Rise M.D.   On: 04/15/2014 00:30     EKG Interpretation None      MDM   Final diagnoses:  Abdominal pain    Patient with constipation and mild stool burden on KUB. Labs unremarkable. Do not suspect surgical cause for the patient's pain such as acute cholecystitis or appendicitis. To be further imaging is necessary. Patient appears comfortable and has a benign abdominal exam. Discharge home with Bentyl and MiraLAX. Return post given.    Julianne Rice, MD 04/15/14 [phone]

## 2014-04-14 NOTE — ED Notes (Signed)
Pt presents tonight with c/o abd pain that has been on and off for about a month  Pt states the pain is more in the middle of her abdomen and states it causes her to feel nauseated

## 2014-04-15 LAB — COMPREHENSIVE METABOLIC PANEL
ALK PHOS: 45 U/L (ref 39–117)
ALT: 8 U/L (ref 0–35)
ANION GAP: 11 (ref 5–15)
AST: 15 U/L (ref 0–37)
Albumin: 3.6 g/dL (ref 3.5–5.2)
BILIRUBIN TOTAL: 0.3 mg/dL (ref 0.3–1.2)
BUN: 11 mg/dL (ref 6–23)
CHLORIDE: 101 meq/L (ref 96–112)
CO2: 24 mEq/L (ref 19–32)
CREATININE: 0.96 mg/dL (ref 0.50–1.10)
Calcium: 9.1 mg/dL (ref 8.4–10.5)
GFR calc non Af Amer: 81 mL/min — ABNORMAL LOW (ref >90)
Glucose, Bld: 95 mg/dL (ref 70–99)
POTASSIUM: 3.8 meq/L (ref 3.7–5.3)
Sodium: 136 mEq/L — ABNORMAL LOW (ref 137–147)
Total Protein: 6.7 g/dL (ref 6.0–8.3)

## 2014-04-15 LAB — URINE MICROSCOPIC-ADD ON

## 2014-04-15 LAB — CBC WITH DIFFERENTIAL/PLATELET
Basophils Absolute: 0 10*3/uL (ref 0.0–0.1)
Basophils Relative: 0 % (ref 0–1)
Eosinophils Absolute: 0.1 10*3/uL (ref 0.0–0.7)
Eosinophils Relative: 1 % (ref 0–5)
HEMATOCRIT: 38.3 % (ref 36.0–46.0)
Hemoglobin: 12.7 g/dL (ref 12.0–15.0)
LYMPHS ABS: 2.2 10*3/uL (ref 0.7–4.0)
Lymphocytes Relative: 34 % (ref 12–46)
MCH: 29.6 pg (ref 26.0–34.0)
MCHC: 33.2 g/dL (ref 30.0–36.0)
MCV: 89.3 fL (ref 78.0–100.0)
MONO ABS: 0.6 10*3/uL (ref 0.1–1.0)
MONOS PCT: 9 % (ref 3–12)
NEUTROS ABS: 3.6 10*3/uL (ref 1.7–7.7)
NEUTROS PCT: 56 % (ref 43–77)
Platelets: 224 10*3/uL (ref 150–400)
RBC: 4.29 MIL/uL (ref 3.87–5.11)
RDW: 12.7 % (ref 11.5–15.5)
WBC: 6.5 10*3/uL (ref 4.0–10.5)

## 2014-04-15 LAB — LIPASE, BLOOD: LIPASE: 31 U/L (ref 11–59)

## 2014-04-15 LAB — URINALYSIS, ROUTINE W REFLEX MICROSCOPIC
BILIRUBIN URINE: NEGATIVE
Glucose, UA: NEGATIVE mg/dL
Hgb urine dipstick: NEGATIVE
Ketones, ur: NEGATIVE mg/dL
Nitrite: NEGATIVE
PROTEIN: NEGATIVE mg/dL
Specific Gravity, Urine: 1.027 (ref 1.005–1.030)
UROBILINOGEN UA: 1 mg/dL (ref 0.0–1.0)
pH: 6 (ref 5.0–8.0)

## 2014-04-15 MED ORDER — POLYETHYLENE GLYCOL 3350 17 G PO PACK: 17.0000 g | PACK | Freq: Every day | ORAL | Status: DC

## 2014-04-15 MED ORDER — DICYCLOMINE HCL 20 MG PO TABS: 20.0000 mg | ORAL_TABLET | Freq: Two times a day (BID) | ORAL | Status: DC | PRN

## 2014-04-15 NOTE — Discharge Instructions (Signed)
Abdominal Pain, Women °Abdominal (stomach, pelvic, or belly) pain can be caused by many things. It is important to tell your doctor: °· The location of the pain. °· Does it come and go or is it present all the time? °· Are there things that start the pain (eating certain foods, exercise)? °· Are there other symptoms associated with the pain (fever, nausea, vomiting, diarrhea)? °All of this is helpful to know when trying to find the cause of the pain. °CAUSES  °· Stomach: virus or bacteria infection, or ulcer. °· Intestine: appendicitis (inflamed appendix), regional ileitis (Crohn's disease), ulcerative colitis (inflamed colon), irritable bowel syndrome, diverticulitis (inflamed diverticulum of the colon), or cancer of the stomach or intestine. °· Gallbladder disease or stones in the gallbladder. °· Kidney disease, kidney stones, or infection. °· Pancreas infection or cancer. °· Fibromyalgia (pain disorder). °· Diseases of the female organs: °¨ Uterus: fibroid (non-cancerous) tumors or infection. °¨ Fallopian tubes: infection or tubal pregnancy. °¨ Ovary: cysts or tumors. °¨ Pelvic adhesions (scar tissue). °¨ Endometriosis (uterus lining tissue growing in the pelvis and on the pelvic organs). °¨ Pelvic congestion syndrome (female organs filling up with blood just before the menstrual period). °¨ Pain with the menstrual period. °¨ Pain with ovulation (producing an egg). °¨ Pain with an IUD (intrauterine device, birth control) in the uterus. °¨ Cancer of the female organs. °· Functional pain (pain not caused by a disease, may improve without treatment). °· Psychological pain. °· Depression. °DIAGNOSIS  °Your doctor will decide the seriousness of your pain by doing an examination. °· Blood tests. °· X-rays. °· Ultrasound. °· CT scan (computed tomography, special type of X-ray). °· MRI (magnetic resonance imaging). °· Cultures, for infection. °· Barium enema (dye inserted in the large intestine, to better view it with  X-rays). °· Colonoscopy (looking in intestine with a lighted tube). °· Laparoscopy (minor surgery, looking in abdomen with a lighted tube). °· Major abdominal exploratory surgery (looking in abdomen with a large incision). °TREATMENT  °The treatment will depend on the cause of the pain.  °· Many cases can be observed and treated at home. °· Over-the-counter medicines recommended by your caregiver. °· Prescription medicine. °· Antibiotics, for infection. °· Birth control pills, for painful periods or for ovulation pain. °· Hormone treatment, for endometriosis. °· Nerve blocking injections. °· Physical therapy. °· Antidepressants. °· Counseling with a psychologist or psychiatrist. °· Minor or major surgery. °HOME CARE INSTRUCTIONS  °· Do not take laxatives, unless directed by your caregiver. °· Take over-the-counter pain medicine only if ordered by your caregiver. Do not take aspirin because it can cause an upset stomach or bleeding. °· Try a clear liquid diet (broth or water) as ordered by your caregiver. Slowly move to a bland diet, as tolerated, if the pain is related to the stomach or intestine. °· Have a thermometer and take your temperature several times a day, and record it. °· Bed rest and sleep, if it helps the pain. °· Avoid sexual intercourse, if it causes pain. °· Avoid stressful situations. °· Keep your follow-up appointments and tests, as your caregiver orders. °· If the pain does not go away with medicine or surgery, you may try: °¨ Acupuncture. °¨ Relaxation exercises (yoga, meditation). °¨ Group therapy. °¨ Counseling. °SEEK MEDICAL CARE IF:  °· You notice certain foods cause stomach pain. °· Your home care treatment is not helping your pain. °· You need stronger pain medicine. °· You want your IUD removed. °· You feel faint or   lightheaded. °· You develop nausea and vomiting. °· You develop a rash. °· You are having side effects or an allergy to your medicine. °SEEK IMMEDIATE MEDICAL CARE IF:  °· Your  pain does not go away or gets worse. °· You have a fever. °· Your pain is felt only in portions of the abdomen. The right side could possibly be appendicitis. The left lower portion of the abdomen could be colitis or diverticulitis. °· You are passing blood in your stools (bright red or black tarry stools, with or without vomiting). °· You have blood in your urine. °· You develop chills, with or without a fever. °· You pass out. °MAKE SURE YOU:  °· Understand these instructions. °· Will watch your condition. °· Will get help right away if you are not doing well or get worse. °Document Released: 03/20/2007 Document Revised: 10/07/2013 Document Reviewed: 04/09/2009 °[employer]® Patient Information ©2015 [employer]. This information is not intended to replace advice given to you by your health care provider. Make sure you discuss any questions you have with your health care provider. ° °Constipation °Constipation is when a person has fewer than three bowel movements a week, has difficulty having a bowel movement, or has stools that are dry, hard, or larger than normal. As people grow older, constipation is more common. If you try to fix constipation with medicines that make you have a bowel movement (laxatives), the problem may get worse. Long-term laxative use may cause the muscles of the colon to become weak. A low-fiber diet, not taking in enough fluids, and taking certain medicines may make constipation worse.  °CAUSES  °· Certain medicines, such as antidepressants, pain medicine, iron supplements, antacids, and water pills.   °· Certain diseases, such as diabetes, irritable bowel syndrome (IBS), thyroid disease, or depression.   °· Not drinking enough water.   °· Not eating enough fiber-rich foods.   °· Stress or travel.   °· Lack of physical activity or exercise.   °· Ignoring the urge to have a bowel movement.   °· Using laxatives too much.   °SIGNS AND SYMPTOMS  °· Having fewer than three bowel movements a  week.   °· Straining to have a bowel movement.   °· Having stools that are hard, dry, or larger than normal.   °· Feeling full or bloated.   °· Pain in the lower abdomen.   °· Not feeling relief after having a bowel movement.   °DIAGNOSIS  °Your health care provider will take a medical history and perform a physical exam. Further testing may be done for severe constipation. Some tests may include: °· A barium enema X-ray to examine your rectum, colon, and, sometimes, your small intestine.   °· A sigmoidoscopy to examine your lower colon.   °· A colonoscopy to examine your entire colon. °TREATMENT  °Treatment will depend on the severity of your constipation and what is causing it. Some dietary treatments include drinking more fluids and eating more fiber-rich foods. Lifestyle treatments may include regular exercise. If these diet and lifestyle recommendations do not help, your health care provider may recommend taking over-the-counter laxative medicines to help you have bowel movements. Prescription medicines may be prescribed if over-the-counter medicines do not work.  °HOME CARE INSTRUCTIONS  °· Eat foods that have a lot of fiber, such as fruits, vegetables, whole grains, and beans. °· Limit foods high in fat and processed sugars, such as french fries, hamburgers, cookies, candies, and soda.   °· A fiber supplement may be added to your diet if you cannot get enough fiber from foods.   °·   Drink enough fluids to keep your urine clear or pale yellow.   °· Exercise regularly or as directed by your health care provider.   °· Go to the restroom when you have the urge to go. Do not hold it.   °· Only take over-the-counter or prescription medicines as directed by your health care provider. Do not take other medicines for constipation without talking to your health care provider first.   °SEEK IMMEDIATE MEDICAL CARE IF:  °· You have bright red blood in your stool.   °· Your constipation lasts for more than 4 days or gets  worse.   °· You have abdominal or rectal pain.   °· You have thin, pencil-like stools.   °· You have unexplained weight loss. °MAKE SURE YOU:  °· Understand these instructions. °· Will watch your condition. °· Will get help right away if you are not doing well or get worse. °Document Released: 02/19/2004 Document Revised: 05/28/2013 Document Reviewed: 03/04/2013 °[employer]® Patient Information ©2015 [employer]. This information is not intended to replace advice given to you by your health care provider. Make sure you discuss any questions you have with your health care provider. ° °

## 2014-05-05 ENCOUNTER — Encounter (HOSPITAL_COMMUNITY): Payer: Self-pay | Admitting: Emergency Medicine

## 2014-05-05 ENCOUNTER — Emergency Department (HOSPITAL_COMMUNITY)
Admission: EM | Admit: 2014-05-05 | Discharge: 2014-05-06 | Disposition: A | Discharge status: Home / Self Care | Payer: Self-pay | Attending: Emergency Medicine | Admitting: Emergency Medicine

## 2014-05-05 DIAGNOSIS — Z792 Long term (current) use of antibiotics: Secondary | ICD-10-CM | POA: Insufficient documentation

## 2014-05-05 DIAGNOSIS — Z3202 Encounter for pregnancy test, result negative: Secondary | ICD-10-CM | POA: Insufficient documentation

## 2014-05-05 DIAGNOSIS — Z720 Tobacco use: Secondary | ICD-10-CM | POA: Insufficient documentation

## 2014-05-05 DIAGNOSIS — M1990 Unspecified osteoarthritis, unspecified site: Secondary | ICD-10-CM | POA: Insufficient documentation

## 2014-05-05 DIAGNOSIS — F419 Anxiety disorder, unspecified: Secondary | ICD-10-CM | POA: Insufficient documentation

## 2014-05-05 DIAGNOSIS — Z872 Personal history of diseases of the skin and subcutaneous tissue: Secondary | ICD-10-CM | POA: Insufficient documentation

## 2014-05-05 DIAGNOSIS — Z791 Long term (current) use of non-steroidal anti-inflammatories (NSAID): Secondary | ICD-10-CM | POA: Insufficient documentation

## 2014-05-05 DIAGNOSIS — Z79899 Other long term (current) drug therapy: Secondary | ICD-10-CM | POA: Insufficient documentation

## 2014-05-05 HISTORY — DX: Anxiety disorder, unspecified: F41.9

## 2014-05-05 LAB — URINALYSIS, ROUTINE W REFLEX MICROSCOPIC
Bilirubin Urine: NEGATIVE
Glucose, UA: NEGATIVE mg/dL
Hgb urine dipstick: NEGATIVE
KETONES UR: NEGATIVE mg/dL
LEUKOCYTES UA: NEGATIVE
Nitrite: NEGATIVE
PH: 5.5 (ref 5.0–8.0)
Protein, ur: NEGATIVE mg/dL
Specific Gravity, Urine: 1.019 (ref 1.005–1.030)
Urobilinogen, UA: 1 mg/dL (ref 0.0–1.0)

## 2014-05-05 LAB — I-STAT CHEM 8, ED
BUN: 7 mg/dL (ref 6–23)
CALCIUM ION: 1.22 mmol/L (ref 1.12–1.23)
CHLORIDE: 103 meq/L (ref 96–112)
Creatinine, Ser: 1 mg/dL (ref 0.50–1.10)
Glucose, Bld: 95 mg/dL (ref 70–99)
HEMATOCRIT: 40 % (ref 36.0–46.0)
Hemoglobin: 13.6 g/dL (ref 12.0–15.0)
POTASSIUM: 3.9 meq/L (ref 3.7–5.3)
SODIUM: 140 meq/L (ref 137–147)
TCO2: 24 mmol/L (ref 0–100)

## 2014-05-05 NOTE — ED Notes (Signed)
Pt states she has been having anxiety attacks that started when she was in prison and she used to take medication for them but she did not feel like it was helping her very much  Pt states she has been having them everyday

## 2014-05-05 NOTE — Discharge Instructions (Signed)
Please follow the directions provided.  Use the referral provided or the Resource Guide below to establish care with a primary care provider to help discuss your symptoms of anxiety.  Don't hesitate to return for new, worsening or concerning symptoms.     SEEK IMMEDIATE MEDICAL CARE IF:  You experience panic attack symptoms that are different than your usual symptoms.  You have serious thoughts about hurting yourself or others.  You are taking medicine for panic attacks and have a serious side effect.   Emergency Department Resource Guide 1) Find a Doctor and Pay Out of Pocket Although you won't have to find out who is covered by your insurance plan, it is a good idea to ask around and get recommendations. You will then need to call the office and see if the doctor you have chosen will accept you as a new patient and what types of options they offer for patients who are self-pay. Some doctors offer discounts or will set up payment plans for their patients who do not have insurance, but you will need to ask so you aren't surprised when you get to your appointment.  2) Contact Your Local Health Department Not all health departments have doctors that can see patients for sick visits, but many do, so it is worth a call to see if yours does. If you don't know where your local health department is, you can check in your phone book. The CDC also has a tool to help you locate your state's health department, and many state websites also have listings of all of their local health departments.  3) Find a [facility] If your illness is not likely to be very severe or complicated, you may want to try a walk in clinic. These are popping up all over the country in pharmacies, drugstores, and shopping centers. They're usually staffed by nurse practitioners or physician assistants that have been trained to treat common illnesses and complaints. They're usually fairly quick and inexpensive. However, if you have  serious medical issues or chronic medical problems, these are probably not your best option.  No Primary Care Doctor: - Call Health Connect at  [phone] - they can help you locate a primary care doctor that  accepts your insurance, provides certain services, etc. - Physician Referral Service- [phone]  Chronic Pain Problems: Organization         Address  Phone   Notes  [facility]  [phone] Patients need to be referred by their primary care doctor.   Medication Assistance: Organization         Address  Phone   Notes  [facility] Medication [facility] [address] [place], [place] [address] [phone] --Must be a resident of [facility] -- Must have NO insurance coverage whatsoever (no Medicaid/ Medicare, etc.) -- The pt. MUST have a primary care doctor that directs their care regularly and follows them in the community   MedAssist  [phone]   [employer]  [phone]    Agencies that provide inexpensive medical care: Organization         Address  Phone   Notes  [place]  [phone]   [facility]    [phone]   [facility] [place], [place] [address] [phone]   [place] [address] [place] [phone]   Planned Parenthood    [phone])  [facility]    [phone]   [place], [place] Phone:  [phone], Fax:  [phone] Hours of Operation:  9 am - 6 pm, M-F.  Also accepts Medicaid/Medicare and self-pay.  [facility] [place], [address], [place] Phone: [phone], Fax: [phone]. Hours of Operation:  8:30 am - 5:30 pm, M-F.  Also accepts Medicaid and self-pay.  [facility] [place] [address], [place] Phone: [phone]   [place],  [place], [place] [phone], Ext. [phone] Mondays & Thursdays: 7-9 AM.  First 15 patients are seen on a first come, first serve basis.    [place] Providers:  Organization         Address  Phone   Notes  [facility] [address], [place] [phone] Also accepts self-pay patients.  [facility] [address], [place]  [phone]   [place], [address], [place] [phone]   [facility] [address] [place] [phone]   Lucianne Lei [address], [place]   [phone] Only accepts [place] Access [place] patients after they have their name applied to their card.   Self-Pay (no insurance) in [facility]:  Organization         Address  Phone   Notes  Sickle Cell Patients, [facility] [place] [phone]   [facility] [place] [phone]   [facility] [place]  [place], [place], [place] [phone]   [facility]/Dr. Osei-Bonsu  [address] [place] or [address], [place] [phone] Phone number for both [place] and [place] locations is the same.  [facility] [address] [place] [phone]   [facility] [address], [place] or [address] [phone]   [facility] [address], [place] [phone], phone; [phone], fax Sees patients 1st and 3rd Saturday of every month.  Must not qualify for public or private insurance (i.e. Medicaid, Medicare, [place] Health Choice, Veterans' Benefits)  Household income should be no more than 200% of the poverty level The clinic cannot treat you if you are pregnant or think you are pregnant   Sexually transmitted diseases are not treated at the clinic.    Dental Care: Organization         Address  Phone  Notes  [facility] Department of [facility] [place] [phone] Accepts children up to age 23 who are enrolled in [place] or [place]; pregnant women with a Medicaid card; and children who have applied for Medicaid or [place] Health Choice, but were declined, whose parents can pay a reduced fee at time of service.  [facility] Department of [facility]  [address], [place] [phone] Accepts children up to age 69 who are enrolled in [place] or [place]; pregnant women with a Medicaid card; and children who have applied for Medicaid or [place], but were declined, whose parents can pay a reduced  fee at time of service.  [facility] Adult [facility] Access PRO[facility]  [place] [phone] Patients are seen by appointment only. Walk-ins are not accepted. [place] will see patients 48 years of age and older. Monday - Tuesday (8am-5pm) Most Wednesdays (8:30-5pm) $30 per visit, cash only  [facility] Adult Dental Access PROGRAM  [address], [facility] [phone] Patients are seen by appointment only. Walk-ins are not accepted. [place] will see patients 49 years of age and older. One Wednesday Evening (Monthly: Volunteer Based).  $30 per visit, cash only  [place]  [phone] for adults; Children under age 70, call Graduate Pediatric Dentistry at [phone]. Children aged 72-14, please call [phone] to request a pediatric application.  Dental services are provided in all areas of dental care including fillings, crowns and bridges, complete and partial dentures, implants, gum treatment, root canals, and extractions. Preventive care is also provided. Treatment is provided to both adults and children. Patients  are selected via a lottery and there is often a waiting list.   [facility] [address], [place]  [phone] [email or web address]   [facility] [address] [place], [place] [phone], Ext. [phone] Second and Fourth Thursday of each month, opens at 6:30 AM; Clinic ends at 9 AM.  Patients are seen on a first-come first-served basis, and a limited number are seen during each clinic.   [facility]  [address] [place], [place] [phone]   Eligibility Requirements You must have lived in [place], [place], or [place] counties for at least the last three months.   You cannot be eligible for state or federal sponsored [employer], including [employer], [place], or [employer].   You generally cannot be eligible for healthcare insurance through your employer.    How to apply: Eligibility screenings are held every Tuesday and Wednesday afternoon from 1:00 pm until 4:00 pm. You do not need an appointment for the interview!  [facility] [address], [place], [place]   [place]  [place] Department  [place]  [phone]    Behavioral Health Resources in the Community: Intensive Outpatient Programs Organization         Address  Phone  Notes  [place]. [address] [place], [place] [phone]   [facility] [address], [place], [place]   ADS: Alcohol & Drug Svcs [address] [place], [place]   [place] [address]  [place], [place] or [phone]   Substance Abuse Resources Organization         Address  Phone  Notes  Alcohol and Drug Services  [phone]   [place]  [phone]   [facility] [place]   Chinita Pester  [phone]    Residential & Outpatient Substance Abuse Program  [phone]   Psychological Services Organization         Address  Phone  Notes  [facility]  [place]  [phone]   [place] [address], [place] or [phone]    Mobile Crisis Teams Organization         Address  Phone  Notes  [facility], Mobile Crisis Care Unit  [phone]  [facility]  [address] [place], [place]   [facility] [address] [place] [phone]    Self-Help/Support Groups Organization         Address  Phone             Notes  [facility] [place] - variety of support groups  [place] Call for more information  Narcotics Anonymous (NA), [facility] [address], [employer] [place]  2 meetings at this location   Special educational needs teacher         Address  Phone  Notes  [facility]AP Residential Treatment [place],    [place]  [phone]   [facility]  [address] [place] [phone], [place], [place]   [place], [place] [phone] Admissions: 8am-3pm M-F  Incentives Substance [place] [address],    [place], [place] [phone]   [facility] [address] [place], [place], [place]   [facility] [address]  [place], [place]   [facility] Programs - Intensive Outpatient [address], [place], [place]   [facility] ([place] [address].,  [place], [place] [phone] or [phone]   [facility] (RTS) [address], [place], [place] Accepts Medicaid  [facility] [place] [address]  [place] [phone] Substance Abuse/Addiction Treatment   [facility] Organization         Address  Phone  Notes  [facility]  [phone]   Domenic Schwab, PhD [address] [place], [place]   [phone] or [phone]   [place], [place] [phone]   [facility] [address] [place], [place] [phone] Insurance/Medicaid/sponsorship through [facility] and Families [address], [address] [place]                                    [place], [place] [phone] [place] [address], [place] [phone]    Dr. Adele Schilder  [phone]   [facility] Dept. 1) [address], [place] 2) [place] 3)  [address], [place] [phone]  [phone]   [facility] [place] [phone] or [phone] (After Hours)

## 2014-05-05 NOTE — ED Provider Notes (Signed)
CSN: [id number]     Arrival date & time 05/05/14  2008 History   First MD Initiated Contact with Patient 05/05/14 2213     Chief Complaint  Patient presents with  . Anxiety   (Consider location/radiation/quality/duration/timing/severity/associated sxs/prior Treatment) HPI Natalie Thornton is a 28 yo female presenting with report of feeling more anxious and increase occurrence of panic attacks.  She began having these panic attacks 2 years ago when she went to prison.  Since then she reports feeling worried or afraid when she knows it is unreasonable to feel worried or afraid.  She states she has these feelings most days.  She describes the feeling of her heart racing and sometimes she feels nervous and it makes her have loose stools.  She also reports feeling distracted sometimes and it feels hard for her to concentrate on what she is doing.  She denies headache, visual changes, nausea, vomiting, abd pain, chest pain, cough, shortness of breath, fevers or chills.  Past Medical History  Diagnosis Date  . Pilonidal cyst   . DDD (degenerative disc disease)   . Arthritis   . Anxiety    History reviewed. No pertinent past surgical history. Family History  Problem Relation Age of Onset  . Cancer Maternal Grandmother     breast   History  Substance Use Topics  . Smoking status: Current Every Day Smoker -- 0.50 packs/day    Types: Cigarettes  . Smokeless tobacco: Not on file  . Alcohol Use: 1.2 oz/week    2 Shots of liquor per week     Comment: weekends   OB History    No data available     Review of Systems  Constitutional: Negative for fever and chills.  HENT: Negative for sore throat.   Eyes: Negative for visual disturbance.  Respiratory: Negative for cough and shortness of breath.   Cardiovascular: Negative for chest pain and leg swelling.  Gastrointestinal: Negative for nausea and vomiting.  Genitourinary: Negative for dysuria.  Musculoskeletal: Negative for myalgias.   Skin: Negative for rash.  Neurological: Negative for weakness, numbness and headaches.  Psychiatric/Behavioral: Positive for decreased concentration. Negative for suicidal ideas. The patient is nervous/anxious.       Allergies  Review of patient's allergies indicates no known allergies.  Home Medications   Prior to Admission medications   Medication Sig Start Date End Date Taking? Authorizing Provider  amoxicillin (AMOXIL) 500 MG capsule Take 1 capsule (500 mg total) by mouth 3 (three) times daily. 03/17/14   Harden Mo, MD  dicyclomine (BENTYL) 20 MG tablet Take 1 tablet (20 mg total) by mouth 2 (two) times daily as needed for spasms. 04/15/14   Julianne Rice, MD  ibuprofen (ADVIL,MOTRIN) 800 MG tablet Take 1 tablet (800 mg total) by mouth 3 (three) times daily. 12/11/13   Shari A Upstill, PA-C  naproxen (NAPROSYN) 500 MG tablet Take 1 tablet (500 mg total) by mouth 2 (two) times daily. 09/10/13   Carlisle Cater, PA-C  polyethylene glycol (MIRALAX / GLYCOLAX) packet Take 17 g by mouth daily. 04/15/14   Julianne Rice, MD   BP 128/66 mmHg  Pulse 81  Temp(Src) 98.7 F (37.1 C) (Oral)  Resp 16  Ht 4\' 11"  (1.499 m)  Wt 150 lb (68.04 kg)  BMI 30.28 kg/m2  SpO2 100%  LMP 04/20/2014 (Approximate) Physical Exam  Constitutional: She appears well-developed and well-nourished. No distress.  HENT:  Head: Normocephalic and atraumatic.  Mouth/Throat: Oropharynx is clear and moist. No oropharyngeal  exudate.  Eyes: Conjunctivae are normal.  Neck: Neck supple. No thyromegaly present.  Cardiovascular: Normal rate, regular rhythm and intact distal pulses.   Pulmonary/Chest: Effort normal and breath sounds normal. No respiratory distress.  Abdominal: Soft. There is no tenderness.  Musculoskeletal: She exhibits no tenderness.  Lymphadenopathy:    She has no cervical adenopathy.  Neurological: She is alert.  Skin: Skin is warm and dry. No rash noted. She is not diaphoretic.  Psychiatric:  She has a normal mood and affect. Her speech is normal and behavior is normal. Judgment and thought content normal. Cognition and memory are normal. She expresses no homicidal and no suicidal ideation.  Nursing note and vitals reviewed.   ED Course  Procedures (including critical care time) Labs Review Labs Reviewed  URINALYSIS, ROUTINE W REFLEX MICROSCOPIC  I-STAT CHEM 8, ED  POC URINE PREG, ED    Imaging Review No results found.   EKG Interpretation None      MDM   Final diagnoses:  Anxiety    28 yo female with report of anxiety symptoms and panic attacks would like resources for managing. Appears reasonable and appropriate, denies SI/HI. Discussed with pt could provide a resource guide for a provider.  Discussed the importance of a Primary care provider and they could help manage symptoms but also there are specific behavioral health resources also.  Discussed wanting to check basic lab and urine with report of panic attacks to check for abnormalities and if all was normal would discharge with the resource guide. Pt aware of plan and agreeable.    Pt up for discharge as soon as urine resulted, RN reports she is no longer in her room and appears to have left.     Filed Vitals:   05/05/14 2121  BP: 128/66  Pulse: 81  Temp: 98.7 F (37.1 C)  TempSrc: Oral  Resp: 16  Height: 4\' 11"  (1.499 m)  Weight: 150 lb (68.04 kg)  SpO2: 100%   Meds given in ED:  Medications - No data to display  New Prescriptions   No medications on file     Britt Bottom, NP 05/07/14 0241  Dorie Rank, MD 05/08/14 1252

## 2014-05-05 NOTE — ED Notes (Signed)
Pt states she would like to talk to someone that could help her  Denies SI/HI

## 2014-05-06 LAB — POC URINE PREG, ED: Preg Test, Ur: NEGATIVE

## 2014-05-06 NOTE — ED Notes (Signed)
Pt left prior to receiving her paperwork

## 2014-06-17 ENCOUNTER — Encounter (HOSPITAL_COMMUNITY): Payer: Self-pay | Admitting: *Deleted

## 2014-06-17 ENCOUNTER — Emergency Department (HOSPITAL_COMMUNITY)
Admission: EM | Admit: 2014-06-17 | Discharge: 2014-06-17 | Disposition: A | Discharge status: Home / Self Care | Payer: Self-pay | Attending: Emergency Medicine | Admitting: Emergency Medicine

## 2014-06-17 DIAGNOSIS — Z720 Tobacco use: Secondary | ICD-10-CM | POA: Insufficient documentation

## 2014-06-17 DIAGNOSIS — Z8659 Personal history of other mental and behavioral disorders: Secondary | ICD-10-CM | POA: Insufficient documentation

## 2014-06-17 DIAGNOSIS — M549 Dorsalgia, unspecified: Secondary | ICD-10-CM | POA: Insufficient documentation

## 2014-06-17 DIAGNOSIS — M542 Cervicalgia: Secondary | ICD-10-CM | POA: Insufficient documentation

## 2014-06-17 DIAGNOSIS — M1990 Unspecified osteoarthritis, unspecified site: Secondary | ICD-10-CM | POA: Insufficient documentation

## 2014-06-17 DIAGNOSIS — Z872 Personal history of diseases of the skin and subcutaneous tissue: Secondary | ICD-10-CM | POA: Insufficient documentation

## 2014-06-17 DIAGNOSIS — G8929 Other chronic pain: Secondary | ICD-10-CM | POA: Insufficient documentation

## 2014-06-17 MED ORDER — MELOXICAM 7.5 MG PO TABS
7.5000 mg | ORAL_TABLET | Freq: Two times a day (BID) | ORAL | Status: DC
Start: 2014-06-17 — End: 2014-12-07

## 2014-06-17 MED ORDER — KETOROLAC TROMETHAMINE 30 MG/ML IJ SOLN
30.0000 mg | Freq: Once | INTRAMUSCULAR | Status: AC
  Administered 2014-06-17: 30 mg via INTRAMUSCULAR
  Filled 2014-06-17: qty 1

## 2014-06-17 NOTE — ED Notes (Signed)
Pt reports chronic back pain x 2 years.  Used to see Dr. Lennice Sites.  States that she needs another xray done-concerned that her back has worsened.  Pt reports hx of degenerative disc disease.  States she cannot afford to see Dr. Lennice Sites anymore.  Pt was ambulatory without difficulty.

## 2014-06-17 NOTE — Discharge Instructions (Signed)
Back Exercises Back exercises help treat and prevent back injuries. The goal is to increase your strength in your belly (abdominal) and back muscles. These exercises can also help with flexibility. Start these exercises when told by your doctor. HOME CARE Back exercises include: Pelvic Tilt.  Lie on your back with your knees bent. Tilt your pelvis until the lower part of your back is against the floor. Hold this position 5 to 10 sec. Repeat this exercise 5 to 10 times. Knee to Chest.  Pull 1 knee up against your chest and hold for 20 to 30 seconds. Repeat this with the other knee. This may be done with the other leg straight or bent, whichever feels better. Then, pull both knees up against your chest. Sit-Ups or Curl-Ups.  Bend your knees 90 degrees. Start with tilting your pelvis, and do a partial, slow sit-up. Only lift your upper half 30 to 45 degrees off the floor. Take at least 2 to 3 seonds for each sit-up. Do not do sit-ups with your knees out straight. If partial sit-ups are difficult, simply do the above but with only tightening your belly (abdominal) muscles and holding it as told. Hip-Lift.  Lie on your back with your knees flexed 90 degrees. Push down with your feet and shoulders as you raise your hips 2 inches off the floor. Hold for 10 seconds, repeat 5 to 10 times. Back Arches.  Lie on your stomach. Prop yourself up on bent elbows. Slowly press on your hands, causing an arch in your low back. Repeat 3 to 5 times. Shoulder-Lifts.  Lie face down with arms beside your body. Keep hips and belly pressed to floor as you slowly lift your head and shoulders off the floor. Do not overdo your exercises. Be careful in the beginning. Exercises may cause you some mild back discomfort. If the pain lasts for more than 15 minutes, stop the exercises until you see your doctor. Improvement with exercise for back problems is slow.  Document Released: 06/25/2010 Document Revised: 08/15/2011  Document Reviewed: 03/24/2011 [facility] Patient Information 2015 [place], [place]. This information is not intended to replace advice given to you by your health care provider. Make sure you discuss any questions you have with your health care provider.  Emergency Department Resource Guide 1) Find a Doctor and Pay Out of Pocket Although you won't have to find out who is covered by your insurance plan, it is a good idea to ask around and get recommendations. You will then need to call the office and see if the doctor you have chosen will accept you as a new patient and what types of options they offer for patients who are self-pay. Some doctors offer discounts or will set up payment plans for their patients who do not have insurance, but you will need to ask so you aren't surprised when you get to your appointment.  2) Contact Your Local Health Department Not all health departments have doctors that can see patients for sick visits, but many do, so it is worth a call to see if yours does. If you don't know where your local health department is, you can check in your phone book. The CDC also has a tool to help you locate your state's health department, and many state websites also have listings of all of their local health departments.  3) Find a [facility] If your illness is not likely to be very severe or complicated, you may want to try a walk in  clinic. These are popping up all over the country in pharmacies, drugstores, and shopping centers. They're usually staffed by nurse practitioners or physician assistants that have been trained to treat common illnesses and complaints. They're usually fairly quick and inexpensive. However, if you have serious medical issues or chronic medical problems, these are probably not your best option.  No Primary Care Doctor: - Call Health Connect at  [phone] - they can help you locate a primary care doctor that  accepts your insurance, provides certain services,  etc. - Physician Referral Service- [phone]  Chronic Pain Problems: Organization         Address  Phone   Notes  [facility]  [phone] Patients need to be referred by their primary care doctor.   Medication Assistance: Organization         Address  Phone   Notes  [facility] Medication [facility] [address] [place], [place] [address] [phone] --Must be a resident of [facility] -- Must have NO insurance coverage whatsoever (no Medicaid/ Medicare, etc.) -- The pt. MUST have a primary care doctor that directs their care regularly and follows them in the community   MedAssist  [phone]   [employer]  [phone]    Agencies that provide inexpensive medical care: Organization         Address  Phone   Notes  [place]  [phone]   [facility]    [phone]   [facility] [place], [place] [address] [phone]   [place] [address] [place] [phone]   Planned Parenthood    [phone]   [facility]    [phone]   [place] [address], [place] Phone:  [phone], Fax:  [phone] Hours of Operation:  9 am - 6 pm, M-F.  Also accepts Medicaid/Medicare and self-pay.  [facility] for [place], [address], [place] Phone: [phone], Fax: [phone]. Hours of Operation:  8:30 am - 5:30 pm, M-F.  Also accepts Medicaid and self-pay.  [facility] [address] [place] Phone: [phone]   [place], [place], [place] [phone], Ext. [phone] Mondays & Thursdays: 7-9 AM.  First 15 patients are seen on a first come, first serve basis.    Cesar Chavez Providers:  Organization         Address  Phone   Notes  [facility] [address],  [phone] Also accepts self-pay patients.  [facility] [address], [employer]  [phone]   [place], [address], [place] [phone]   [facility] [address] [place] [phone]   Lucianne Lei [address], [place]   [phone] Only accepts [place] Access [place] patients after they have their name applied to their card.   Self-Pay (no insurance) in [facility]:  Organization         Address  Phone   Notes  Sickle Cell Patients, [facility] Internal Medicine [place] [phone]   [facility] Urgent Care [place] [phone]   [facility] [place]  [address], [place] [phone]   [facility]/Dr. Osei-Bonsu  [address], [place] [employer] or [address], [place] [phone] Phone number for both [place] and [employer] locations is the same.  Urgent Medical and [facility] [address], [place] [phone]   [facility] [address] [place] or [address] [phone]   [facility] [address] [place] [phone], phone; [phone], fax Sees patients 1st and 3rd Saturday of every month.  Must not qualify for public or private insurance (i.e. Medicaid, Medicare, [place] Health Choice, Veterans' Benefits)  Household income should be no more than 200% of the poverty level The clinic cannot treat you if you are pregnant or think you are pregnant  Sexually transmitted diseases are not treated at the clinic.    Dental Care: Organization         Address  Phone  Notes  [facility] Department of [facility] [place] [phone] Accepts children up to  age 60 who are enrolled in [place] or [place]; pregnant women with a Medicaid card; and children who have applied for Medicaid or [place] Health Choice, but were declined, whose parents can pay a reduced fee at time of service.  [facility] Department of [facility]  [address], [place] [phone] Accepts children up to age 48 who are enrolled in [place] or [place]; pregnant women with a Medicaid card; and children who have applied for Medicaid or [place] Health Choice, but were declined, whose parents can pay a reduced fee at time of service.  [place] Adult Dental Access PROGRAM  [place] [phone] Patients are seen by appointment only. Walk-ins are not accepted. [place] will see patients 52 years of age and older. Monday - Tuesday (8am-5pm) Most Wednesdays (8:30-5pm) $30 per visit, cash only  [facility] Adult Dental Access PROGRAM  [address], [facility] [phone] Patients are seen by appointment only. Walk-ins are not accepted. [place] will see patients 104 years of age and older. One Wednesday Evening (Monthly: Volunteer Based).  $30 per visit, cash only  [place]  [phone] for adults; Children under age 55, call Graduate Pediatric Dentistry at [phone]. Children aged 53-14, please call [phone] to request a pediatric application.  Dental services are provided in all areas of dental care including fillings, crowns and bridges, complete and partial dentures, implants, gum treatment, root canals, and extractions. Preventive care is also provided. Treatment is provided to both adults and children. Patients are selected via a lottery and there is often a waiting list.   [facility] [address], [place]  [phone] [email or web address]   [facility] [address] [place], [place] [phone], Ext. [phone] Second and Fourth Thursday of  each month, opens at 6:30 AM; Clinic ends at 9 AM.  Patients are seen on a first-come first-served basis, and a limited number are seen during each clinic.   [facility]  [address] Hillard Danker [place], [place] [phone]   Eligibility Requirements You must have lived in [place], [place], or [place] counties for at least the last three months.   You cannot be eligible for state or federal sponsored [employer], including  [employer], [place], or [employer].   You generally cannot be eligible for healthcare insurance through your employer.    How to apply: Eligibility screenings are held every Tuesday and Wednesday afternoon from 1:00 pm until 4:00 pm. You do not need an appointment for the interview!  [facility] [address] [place], [place]   [place]  [place] Department  [place]  [phone]    Behavioral Health Resources in the Community: Intensive Outpatient Programs Organization         Address  Phone  Notes  [place]. [address] [place], [place] [phone]   [facility] Outpatient [address], [place], [place]   ADS: Alcohol & Drug Svcs [address], [place], [place]   [place] [address],  [place], [place] or [phone]   Substance Abuse Resources Organization         Address  Phone  Notes  Alcohol and Drug Services  [phone]   [place]  8207029456   [facility] [place]   Chinita Pester  [phone]   Residential & Outpatient Substance Abuse Program  [phone]   Psychological Services Organization         Address  Phone  Notes  [facility] [place]  [place]  [phone]   [place] [address]  [place] or [phone]    Mobile Crisis Teams Organization         Address  Phone  Notes  [facility], Mobile Crisis Care Unit  [phone]   [facility]  [address] [place], [place]   [place] Levels [address] [place] [phone]    Self-Help/Support Groups Organization         Address  Phone             Notes  [place]. of [place] - variety of support groups  [place] Call for more information  Narcotics Anonymous (NA), [facility] [address], [employer] [place]  2 meetings at this location   Special educational needs teacher         Address  Phone  Notes  [facility] [place],    [place]  [phone])[phone]   [facility]  [address], [place] [phone], [place], [place]   [place], [place] [phone] Admissions: 8am-3pm M-F  Incentives Substance [place] [address]    [place], [place] [phone]   [facility] [address] [place], [place], [place]   [facility] [address]  [place], [place]   [facility] - Intensive Outpatient [address], [place], [place]   [facility] ([place].,  [place], [place] [phone] or [phone]   [facility] (RTS) [address] [place], [place] Accepts Medicaid  [place] [address],  [place] [phone] Substance Abuse/Addiction Treatment   [facility] Organization         Address  Phone  Notes  [facility]  [phone]   Domenic Schwab, PhD [address] [place], [place]   [phone] or [phone]  [facility]   [address] [place], [place] [phone]     [place] [address], [place], [place] [phone] Insurance/Medicaid/sponsorship through [facility] and Families [address], [address] [place], [place] [phone] [place], [place] [phone]    Dr. Adele Schilder  [phone]   [facility] Dept. 1) [address] [place] 2) [place] 3)  [place] [address], [place] [phone]  [phone]   [place] [phone] or [phone] (After Hours)

## 2014-06-17 NOTE — ED Provider Notes (Signed)
CSN: [id number]     Arrival date & time 06/17/14  1909 History  This chart was scribed for non-physician practitioner, Garald Balding, NP, working with Pamella Pert, MD, by Jeanell Sparrow, ED Scribe. This patient was seen in room WTR7/WTR7 and the patient's care was started at 9:45 PM.   Chief Complaint  Patient presents with  . Back Pain   The history is provided by the patient. No language interpreter was used.   HPI Comments: Natalie Thornton is a 29 y.o. female with a hx of DDD who presents to the Emergency Department complaining of constant moderate generalized back pain that has been going on for 2 years. She states that the chronic pain waxes and wanes. She reports that she wants an x ray done on her back. She states that she tried ibuprofen and naproxen without any relief. She reports that the pain goes from her lower back all the way up to her neck. She denies any difficulty ambulating or bowel or bladder incontinence.   No PCP  Past Medical History  Diagnosis Date  . Pilonidal cyst   . DDD (degenerative disc disease)   . Arthritis   . Anxiety    History reviewed. No pertinent past surgical history. Family History  Problem Relation Age of Onset  . Cancer Maternal Grandmother     breast   History  Substance Use Topics  . Smoking status: Current Every Day Smoker -- 0.50 packs/day    Types: Cigarettes  . Smokeless tobacco: Not on file  . Alcohol Use: 1.2 oz/week    2 Shots of liquor per week     Comment: weekends   OB History    No data available     Review of Systems  Genitourinary: Negative for urgency.  Musculoskeletal: Positive for back pain and neck pain. Negative for gait problem.  All other systems reviewed and are negative.  Allergies  Review of patient's allergies indicates no known allergies.  Home Medications   Prior to Admission medications   Medication Sig Start Date End Date Taking? Authorizing Provider  amoxicillin (AMOXIL) 500 MG capsule Take  1 capsule (500 mg total) by mouth 3 (three) times daily. Patient not taking: Reported on 05/05/2014 03/17/14   Harden Mo, MD  dicyclomine (BENTYL) 20 MG tablet Take 1 tablet (20 mg total) by mouth 2 (two) times daily as needed for spasms. Patient not taking: Reported on 05/05/2014 04/15/14   Julianne Rice, MD  ibuprofen (ADVIL,MOTRIN) 800 MG tablet Take 1 tablet (800 mg total) by mouth 3 (three) times daily. Patient not taking: Reported on 06/17/2014 12/11/13   Nehemiah Settle A Upstill, PA-C  meloxicam (MOBIC) 7.5 MG tablet Take 1 tablet (7.5 mg total) by mouth 2 (two) times daily. 06/17/14   Garald Balding, NP  naproxen (NAPROSYN) 500 MG tablet Take 1 tablet (500 mg total) by mouth 2 (two) times daily. Patient not taking: Reported on 05/05/2014 09/10/13   Carlisle Cater, PA-C  polyethylene glycol (MIRALAX / GLYCOLAX) packet Take 17 g by mouth daily. Patient not taking: Reported on 05/05/2014 04/15/14   Julianne Rice, MD   BP 117/68 mmHg  Pulse 72  Temp(Src) 98.6 F (37 C) (Oral)  Resp 18  SpO2 100%  LMP 06/13/2014 Physical Exam  Constitutional: She is oriented to person, place, and time. She appears well-developed and well-nourished. No distress.  HENT:  Head: Normocephalic and atraumatic.  Neck: Neck supple.  Cardiovascular: Normal rate.   Pulmonary/Chest: Effort normal. No respiratory  distress.  Musculoskeletal: Normal range of motion. She exhibits no edema or tenderness.  No back TTP noted. Steady gait without limp  Neurological: She is alert and oriented to person, place, and time.  Skin: Skin is warm and dry.  Psychiatric: She has a normal mood and affect. Her behavior is normal.  Nursing note and vitals reviewed.   ED Course  Procedures (including critical care time) DIAGNOSTIC STUDIES: Oxygen Saturation is 100% on RA, normal by my interpretation.    COORDINATION OF CARE: 9:49 PM- Pt advised of plan for treatment and pt agrees.  Labs Review Labs Reviewed - No data to  display  Imaging Review No results found.   EKG Interpretation None      MDM   Final diagnoses:  Chronic back pain greater than 3 months duration    I personally performed the services described in this documentation, which was scribed in my presence. The recorded information has been reviewed and is accurate.    Garald Balding, NP 06/17/14 8341  Pamella Pert, MD 06/19/14 1102

## 2014-06-17 NOTE — ED Notes (Signed)
Pt reports she is leaving with ALL belongings she arrived with. She was advised to find a PCP and resources guide provided. She is alert, oriented, and ambulatory upon DC.

## 2014-07-09 ENCOUNTER — Emergency Department (HOSPITAL_COMMUNITY): Payer: Self-pay

## 2014-07-09 ENCOUNTER — Emergency Department (HOSPITAL_COMMUNITY)
Admission: EM | Admit: 2014-07-09 | Discharge: 2014-07-09 | Discharge status: Against Medical Advice | Payer: Self-pay | Attending: Emergency Medicine | Admitting: Emergency Medicine

## 2014-07-09 ENCOUNTER — Encounter (HOSPITAL_COMMUNITY): Payer: Self-pay

## 2014-07-09 DIAGNOSIS — M25572 Pain in left ankle and joints of left foot: Secondary | ICD-10-CM | POA: Insufficient documentation

## 2014-07-09 DIAGNOSIS — Z720 Tobacco use: Secondary | ICD-10-CM | POA: Insufficient documentation

## 2014-07-09 MED ORDER — NAPROXEN 500 MG PO TABS
500.0000 mg | ORAL_TABLET | Freq: Once | ORAL | Status: AC
  Administered 2014-07-09: 500 mg via ORAL
  Filled 2014-07-09: qty 1

## 2014-07-09 MED ORDER — NAPROXEN 500 MG PO TABS: 500.0000 mg | ORAL_TABLET | Freq: Two times a day (BID) | ORAL | Status: DC

## 2014-07-09 NOTE — ED Notes (Signed)
Called for pt x 3.  Not found in lobby

## 2014-07-09 NOTE — ED Notes (Signed)
Pt came back up to desk with coffee cup asking if her name had been called. Pt ambulated to triage room.

## 2014-07-09 NOTE — Discharge Instructions (Signed)
Take the prescribed medication as directed. Follow-up with Dr. Veverly Fells (orthopedics) if symptoms worsen. Return to the ED for new or worsening symptoms.

## 2014-07-09 NOTE — ED Notes (Signed)
Per pt, working out at gym on Monday.  Noticed soon after that her left ankle was hurting.  Does not recall specific injury.  Pt ambulatory to triage.

## 2014-07-09 NOTE — ED Provider Notes (Signed)
Patient was selected for room assignment but could not be located for triage.  Patient LWBS prior to triage.  I did not see or evaluate patient.  Larene Pickett, PA-C 07/09/14 [place], MD 07/10/14 [phone]

## 2014-09-21 ENCOUNTER — Encounter (HOSPITAL_COMMUNITY): Payer: Self-pay | Admitting: Emergency Medicine

## 2014-09-21 ENCOUNTER — Emergency Department (HOSPITAL_COMMUNITY)
Admission: EM | Admit: 2014-09-21 | Discharge: 2014-09-21 | Disposition: A | Discharge status: Home / Self Care | Payer: Self-pay | Attending: Emergency Medicine | Admitting: Emergency Medicine

## 2014-09-21 DIAGNOSIS — Z792 Long term (current) use of antibiotics: Secondary | ICD-10-CM | POA: Insufficient documentation

## 2014-09-21 DIAGNOSIS — Z720 Tobacco use: Secondary | ICD-10-CM | POA: Insufficient documentation

## 2014-09-21 DIAGNOSIS — H0013 Chalazion right eye, unspecified eyelid: Secondary | ICD-10-CM | POA: Insufficient documentation

## 2014-09-21 DIAGNOSIS — Z791 Long term (current) use of non-steroidal anti-inflammatories (NSAID): Secondary | ICD-10-CM | POA: Insufficient documentation

## 2014-09-21 DIAGNOSIS — J029 Acute pharyngitis, unspecified: Secondary | ICD-10-CM | POA: Insufficient documentation

## 2014-09-21 DIAGNOSIS — Z8739 Personal history of other diseases of the musculoskeletal system and connective tissue: Secondary | ICD-10-CM | POA: Insufficient documentation

## 2014-09-21 DIAGNOSIS — Z872 Personal history of diseases of the skin and subcutaneous tissue: Secondary | ICD-10-CM | POA: Insufficient documentation

## 2014-09-21 DIAGNOSIS — J312 Chronic pharyngitis: Secondary | ICD-10-CM

## 2014-09-21 DIAGNOSIS — Z8659 Personal history of other mental and behavioral disorders: Secondary | ICD-10-CM | POA: Insufficient documentation

## 2014-09-21 DIAGNOSIS — Z79899 Other long term (current) drug therapy: Secondary | ICD-10-CM | POA: Insufficient documentation

## 2014-09-21 LAB — RAPID STREP SCREEN (MED CTR MEBANE ONLY): Streptococcus, Group A Screen (Direct): NEGATIVE

## 2014-09-21 NOTE — Discharge Instructions (Signed)
Chalazion A chalazion is a swelling or hard lump on the eyelid caused by a blocked oil gland. Chalazions may occur on the upper or the lower eyelid.  CAUSES  Oil gland in the eyelid becomes blocked. SYMPTOMS   Swelling or hard lump on the eyelid. This lump may make it hard to see out of the eye.  The swelling may spread to areas around the eye. TREATMENT   Although some chalazions disappear by themselves in 1 or 2 months, some chalazions may need to be removed.  Medicines to treat an infection may be required. HOME CARE INSTRUCTIONS   Wash your hands often and dry them with a clean towel. Do not touch the chalazion.  Apply heat to the eyelid several times a day for 10 minutes to help ease discomfort and bring any yellowish white fluid (pus) to the surface. One way to apply heat to a chalazion is to use the handle of a metal spoon.  Hold the handle under hot water until it is hot, and then wrap the handle in paper towels so that the heat can come through without burning your skin.  Hold the wrapped handle against the chalazion and reheat the spoon handle as needed.  Apply heat in this fashion for 10 minutes, 4 times per day.  Return to your caregiver to have the pus removed if it does not break (rupture) on its own.  Do not try to remove the pus yourself by squeezing the chalazion or sticking it with a pin or needle.  Only take over-the-counter or prescription medicines for pain, discomfort, or fever as directed by your caregiver. SEEK IMMEDIATE MEDICAL CARE IF:   You have pain in your eye.  Your vision changes.  The chalazion does not go away.  The chalazion becomes painful, red, or swollen, grows larger, or does not start to disappear after 2 weeks. MAKE SURE YOU:   Understand these instructions.  Will watch your condition.  Will get help right away if you are not doing well or get worse. Document Released: 05/20/2000 Document Revised: 08/15/2011 Document Reviewed:  09/07/2009 [facility] Patient Information 2015 [place], [place]. This information is not intended to replace advice given to you by your health care provider. Make sure you discuss any questions you have with your health care provider. your strep test is negative

## 2014-09-21 NOTE — ED Provider Notes (Signed)
CSN: [id number]     Arrival date & time 09/21/14  2250 History  This chart was scribed for non-physician practitioner, Junius Creamer, [place] working with Debby Freiberg, MD by Frederich Balding, ED scribe. This patient was seen in room WTR7/WTR7 and the patient's care was started at 11:11 PM.   Chief Complaint  Patient presents with  . Sore Throat  . Oral Swelling  . Eye Problem   The history is provided by the patient. No language interpreter was used.    HPI Comments: Natalie Thornton is a 29 y.o. female who presents to the Emergency Department complaining of intermittent sore throat over the last 3 months. She states it will normally come every week and last about a day. It has become more frequent within the last week. Pt states she has had a sore throat 4 times this week. She has not taken any medications. There are no alleviating factors. She is also complaining of a bump on her right eyelid that started 1 month ago. Pt denies fever.   Past Medical History  Diagnosis Date  . Pilonidal cyst   . DDD (degenerative disc disease)   . Arthritis   . Anxiety    History reviewed. No pertinent past surgical history. Family History  Problem Relation Age of Onset  . Cancer Maternal Grandmother     breast   History  Substance Use Topics  . Smoking status: Current Every Day Smoker -- 0.50 packs/day    Types: Cigarettes  . Smokeless tobacco: Not on file  . Alcohol Use: 1.2 oz/week    2 Shots of liquor per week     Comment: weekends   OB History    No data available     Review of Systems  Constitutional: Negative for fever.  HENT: Positive for sore throat.   All other systems reviewed and are negative.  Allergies  Review of patient's allergies indicates no known allergies.  Home Medications   Prior to Admission medications   Medication Sig Start Date End Date Taking? Authorizing Provider  amoxicillin (AMOXIL) 500 MG capsule Take 1 capsule (500 mg total) by mouth 3 (three) times  daily. Patient not taking: Reported on 05/05/2014 03/17/14   Harden Mo, MD  dicyclomine (BENTYL) 20 MG tablet Take 1 tablet (20 mg total) by mouth 2 (two) times daily as needed for spasms. Patient not taking: Reported on 05/05/2014 04/15/14   Julianne Rice, MD  ibuprofen (ADVIL,MOTRIN) 800 MG tablet Take 1 tablet (800 mg total) by mouth 3 (three) times daily. Patient not taking: Reported on 06/17/2014 12/11/13   Charlann Lange, PA-C  meloxicam (MOBIC) 7.5 MG tablet Take 1 tablet (7.5 mg total) by mouth 2 (two) times daily. Patient not taking: Reported on 07/09/2014 06/17/14   Junius Creamer, NP  naproxen (NAPROSYN) 500 MG tablet Take 1 tablet (500 mg total) by mouth 2 (two) times daily with a meal. Patient not taking: Reported on 09/21/2014 07/09/14   Larene Pickett, PA-C  polyethylene glycol [facility] / Floria Raveling) packet Take 17 g by mouth daily. Patient not taking: Reported on 05/05/2014 04/15/14   Julianne Rice, MD   BP 124/78 mmHg  Pulse 75  Temp(Src) 98.2 F (36.8 C) (Oral)  Resp 20  SpO2 99%  LMP 08/21/2014   Physical Exam  Constitutional: She is oriented to person, place, and time. She appears well-developed and well-nourished. No distress.  HENT:  Head: Normocephalic and atraumatic.  Mouth/Throat: Oropharynx is clear and moist. No oropharyngeal exudate, posterior  oropharyngeal edema or posterior oropharyngeal erythema.  Eyes: Conjunctivae and EOM are normal.  Neck: Neck supple. No tracheal deviation present.  Cardiovascular: Normal rate.   Pulmonary/Chest: Effort normal. No respiratory distress.  Musculoskeletal: Normal range of motion.  Neurological: She is alert and oriented to person, place, and time.  Skin: Skin is warm and dry.  Psychiatric: She has a normal mood and affect. Her behavior is normal.  Nursing note and vitals reviewed.   ED Course  Procedures (including critical care time)  DIAGNOSTIC STUDIES: Oxygen Saturation is 99% on RA, normal by my interpretation.     COORDINATION OF CARE: 11:14 PM-Discussed treatment plan which includes rapid strep screen with pt at bedside and pt agreed to plan.   Labs Review Labs Reviewed  RAPID STREP SCREEN  CULTURE, GROUP A STREP    Imaging Review No results found.   EKG Interpretation None    strep test is negative will refer patient to ENT for chronic pharyngitis  MDM   Final diagnoses:  None   I personally performed the services described in this documentation, which was scribed in my presence. The recorded information has been reviewed and is accurate.  Junius Creamer, NP 09/21/14 [id number]  Debby Freiberg, MD 09/25/14 [phone]

## 2014-09-21 NOTE — ED Notes (Signed)
Pt c/o swollen tonsils since yesterday. Pt c/o pain when swallowing. Pt denies fevers, chills. When asked if pt had any N/V, pt sts "yea I vomit sometimes." Pt A&Ox4 and ambulatory. Pt also c/o bump on R eye lid x 1 month. Denies vision changes

## 2014-09-24 LAB — CULTURE, GROUP A STREP: STREP A CULTURE: NEGATIVE

## 2014-10-16 ENCOUNTER — Telehealth: Payer: Self-pay | Admitting: *Deleted

## 2014-10-16 NOTE — Telephone Encounter (Signed)
Patient wanting to ask medical advice questions of RN.  Patient is not established here.  Transferred to front desk to female appointment.

## 2014-11-11 DIAGNOSIS — Z720 Tobacco use: Secondary | ICD-10-CM | POA: Insufficient documentation

## 2014-11-11 DIAGNOSIS — Z8659 Personal history of other mental and behavioral disorders: Secondary | ICD-10-CM | POA: Insufficient documentation

## 2014-11-11 DIAGNOSIS — Z79899 Other long term (current) drug therapy: Secondary | ICD-10-CM | POA: Insufficient documentation

## 2014-11-11 DIAGNOSIS — Z791 Long term (current) use of non-steroidal anti-inflammatories (NSAID): Secondary | ICD-10-CM | POA: Insufficient documentation

## 2014-11-11 DIAGNOSIS — Z872 Personal history of diseases of the skin and subcutaneous tissue: Secondary | ICD-10-CM | POA: Insufficient documentation

## 2014-11-11 DIAGNOSIS — M1990 Unspecified osteoarthritis, unspecified site: Secondary | ICD-10-CM | POA: Insufficient documentation

## 2014-11-11 DIAGNOSIS — H0011 Chalazion right upper eyelid: Secondary | ICD-10-CM | POA: Insufficient documentation

## 2014-11-11 DIAGNOSIS — Z792 Long term (current) use of antibiotics: Secondary | ICD-10-CM | POA: Insufficient documentation

## 2014-11-12 ENCOUNTER — Encounter (HOSPITAL_COMMUNITY): Payer: Self-pay | Admitting: Emergency Medicine

## 2014-11-12 ENCOUNTER — Emergency Department (HOSPITAL_COMMUNITY)
Admission: EM | Admit: 2014-11-12 | Discharge: 2014-11-12 | Disposition: A | Discharge status: Home / Self Care | Payer: Self-pay | Attending: Emergency Medicine | Admitting: Emergency Medicine

## 2014-11-12 DIAGNOSIS — H0013 Chalazion right eye, unspecified eyelid: Secondary | ICD-10-CM

## 2014-11-12 MED ORDER — IBUPROFEN 800 MG PO TABS: 800.0000 mg | ORAL_TABLET | Freq: Three times a day (TID) | ORAL | Status: DC

## 2014-11-12 MED ORDER — CEPHALEXIN 500 MG PO CAPS: 500.0000 mg | ORAL_CAPSULE | Freq: Four times a day (QID) | ORAL | Status: DC

## 2014-11-12 NOTE — ED Notes (Signed)
Pt states she is having pain in her right eye for several months  Pt states the pain has been worse lately  Pt states she has blurred vision sometimes and has photosensitivity at times as well

## 2014-11-12 NOTE — Discharge Instructions (Signed)
Chalazion A chalazion is a swelling or hard lump on the eyelid caused by a blocked oil gland. Chalazions may occur on the upper or the lower eyelid.  CAUSES  Oil gland in the eyelid becomes blocked. SYMPTOMS   Swelling or hard lump on the eyelid. This lump may make it hard to see out of the eye.  The swelling may spread to areas around the eye. TREATMENT   Although some chalazions disappear by themselves in 1 or 2 months, some chalazions may need to be removed.  Medicines to treat an infection may be required. HOME CARE INSTRUCTIONS   Wash your hands often and dry them with a clean towel. Do not touch the chalazion.  Apply heat to the eyelid several times a day for 10 minutes to help ease discomfort and bring any yellowish white fluid (pus) to the surface. One way to apply heat to a chalazion is to use the handle of a metal spoon.  Hold the handle under hot water until it is hot, and then wrap the handle in paper towels so that the heat can come through without burning your skin.  Hold the wrapped handle against the chalazion and reheat the spoon handle as needed.  Apply heat in this fashion for 10 minutes, 4 times per day.  Return to your caregiver to have the pus removed if it does not break (rupture) on its own.  Do not try to remove the pus yourself by squeezing the chalazion or sticking it with a pin or needle.  Only take over-the-counter or prescription medicines for pain, discomfort, or fever as directed by your caregiver. SEEK IMMEDIATE MEDICAL CARE IF:   You have pain in your eye.  Your vision changes.  The chalazion does not go away.  The chalazion becomes painful, red, or swollen, grows larger, or does not start to disappear after 2 weeks. MAKE SURE YOU:   Understand these instructions.  Will watch your condition.  Will get help right away if you are not doing well or get worse. Document Released: 05/20/2000 Document Revised: 08/15/2011 Document Reviewed:  09/07/2009 Exit[employer] Patient Information 2015 [employer]. This information is not intended to replace advice given to you by your health care provider. Make sure you discuss any questions you have with your health care provider.  

## 2014-11-12 NOTE — ED Provider Notes (Signed)
CSN: [id number]     Arrival date & time 11/11/14  2347 History   First MD Initiated Contact with Patient 11/12/14 [phone]     Chief Complaint  Patient presents with  . Eye Pain     (Consider location/radiation/quality/duration/timing/severity/associated sxs/prior Treatment) Patient is a 29 y.o. female presenting with eye pain. The history is provided by the patient. No language interpreter was used.  Eye Pain This is a chronic problem. The current episode started more than 1 month ago. The problem occurs constantly. The problem has been gradually worsening. Pertinent negatives include no fever. Associated symptoms comments: Pain involving the right upper eye lid for several months. No drainage at any time. She reports swelling of the upper lid with an increasing area of discomfort. .    Past Medical History  Diagnosis Date  . Pilonidal cyst   . DDD (degenerative disc disease)   . Arthritis   . Anxiety    History reviewed. No pertinent past surgical history. Family History  Problem Relation Age of Onset  . Cancer Maternal Grandmother     breast   History  Substance Use Topics  . Smoking status: Current Some Day Smoker -- 0.50 packs/day    Types: Cigarettes  . Smokeless tobacco: Not on file  . Alcohol Use: 1.2 oz/week    2 Shots of liquor per week     Comment: weekends   OB History    No data available     Review of Systems  Constitutional: Negative for fever.  Eyes: Positive for pain. Negative for discharge and visual disturbance.      Allergies  Review of patient's allergies indicates no known allergies.  Home Medications   Prior to Admission medications   Medication Sig Start Date End Date Taking? Authorizing Provider  acetaminophen (TYLENOL) 500 MG tablet Take 1,000 mg by mouth every 6 (six) hours as needed for mild pain.   Yes Historical Provider, MD  amoxicillin (AMOXIL) 500 MG capsule Take 1 capsule (500 mg total) by mouth 3 (three) times daily. Patient not  taking: Reported on 05/05/2014 03/17/14   Harden Mo, MD  dicyclomine (BENTYL) 20 MG tablet Take 1 tablet (20 mg total) by mouth 2 (two) times daily as needed for spasms. Patient not taking: Reported on 05/05/2014 04/15/14   Julianne Rice, MD  ibuprofen (ADVIL,MOTRIN) 800 MG tablet Take 1 tablet (800 mg total) by mouth 3 (three) times daily. Patient not taking: Reported on 06/17/2014 12/11/13   Charlann Lange, PA-C  meloxicam (MOBIC) 7.5 MG tablet Take 1 tablet (7.5 mg total) by mouth 2 (two) times daily. Patient not taking: Reported on 07/09/2014 06/17/14   Junius Creamer, NP  naproxen (NAPROSYN) 500 MG tablet Take 1 tablet (500 mg total) by mouth 2 (two) times daily with a meal. Patient not taking: Reported on 09/21/2014 07/09/14   Larene Pickett, PA-C  polyethylene glycol [facility] / Floria Raveling) packet Take 17 g by mouth daily. Patient not taking: Reported on 05/05/2014 04/15/14   Julianne Rice, MD   BP 118/70 mmHg  Pulse 72  Temp(Src) 98.6 F (37 C) (Oral)  Resp 15  SpO2 99%  LMP 10/19/2014 (Approximate) Physical Exam  Constitutional: She is oriented to person, place, and time. She appears well-developed and well-nourished.  Eyes: Conjunctivae are normal. Pupils are equal, round, and reactive to light.  Palpable nodular mass in central upper eye lid without fluctuance. No redness. There is mild swelling extending to the lateral lid.  Neck: Normal range of  motion.  Pulmonary/Chest: Effort normal.  Neurological: She is alert and oriented to person, place, and time.  Skin: Skin is warm and dry.    ED Course  Procedures (including critical care time) Labs Review Labs Reviewed - No data to display  Imaging Review No results found.   EKG Interpretation None      MDM   Final diagnoses:  None    1. Chalazion  Warm compresses recommended. Will provide ophtho referral. Will give antibiotic given the increased pain and new swelling.     Charlann Lange, PA-C 11/12/14  9024  Everlene Balls, MD 11/12/14 [phone]

## 2014-12-06 ENCOUNTER — Encounter (HOSPITAL_COMMUNITY): Payer: Self-pay

## 2014-12-06 ENCOUNTER — Inpatient Hospital Stay (HOSPITAL_COMMUNITY)
Admission: AD | Admit: 2014-12-06 | Discharge: 2014-12-07 | Disposition: A | Discharge status: Home / Self Care | Payer: Self-pay | Source: Ambulatory Visit | Attending: Obstetrics & Gynecology | Admitting: Obstetrics & Gynecology

## 2014-12-06 DIAGNOSIS — Z113 Encounter for screening for infections with a predominantly sexual mode of transmission: Secondary | ICD-10-CM | POA: Insufficient documentation

## 2014-12-06 DIAGNOSIS — N390 Urinary tract infection, site not specified: Secondary | ICD-10-CM | POA: Insufficient documentation

## 2014-12-06 DIAGNOSIS — Z3202 Encounter for pregnancy test, result negative: Secondary | ICD-10-CM | POA: Insufficient documentation

## 2014-12-06 LAB — POCT PREGNANCY, URINE: PREG TEST UR: NEGATIVE

## 2014-12-06 NOTE — MAU Note (Signed)
After I pee it hurts and is real painful for 2 days. Denies vag d/c

## 2014-12-07 DIAGNOSIS — N390 Urinary tract infection, site not specified: Secondary | ICD-10-CM

## 2014-12-07 LAB — URINALYSIS, ROUTINE W REFLEX MICROSCOPIC
Bilirubin Urine: NEGATIVE
Glucose, UA: NEGATIVE mg/dL
KETONES UR: NEGATIVE mg/dL
NITRITE: NEGATIVE
PROTEIN: NEGATIVE mg/dL
Specific Gravity, Urine: 1.025 (ref 1.005–1.030)
Urobilinogen, UA: 0.2 mg/dL (ref 0.0–1.0)
pH: 6 (ref 5.0–8.0)

## 2014-12-07 LAB — URINE MICROSCOPIC-ADD ON

## 2014-12-07 LAB — WET PREP, GENITAL
Clue Cells Wet Prep HPF POC: NONE SEEN
TRICH WET PREP: NONE SEEN
YEAST WET PREP: NONE SEEN

## 2014-12-07 LAB — HIV ANTIBODY (ROUTINE TESTING W REFLEX): HIV SCREEN 4TH GENERATION: NONREACTIVE

## 2014-12-07 LAB — RPR: RPR Ser Ql: NONREACTIVE

## 2014-12-07 MED ORDER — NITROFURANTOIN MONOHYD MACRO 100 MG PO CAPS: 100.0000 mg | ORAL_CAPSULE | Freq: Two times a day (BID) | ORAL | Status: DC

## 2014-12-07 MED ORDER — IBUPROFEN 600 MG PO TABS
600.0000 mg | ORAL_TABLET | Freq: Once | ORAL | Status: AC
  Administered 2014-12-07: 600 mg via ORAL
  Filled 2014-12-07: qty 1

## 2014-12-07 NOTE — MAU Provider Note (Signed)
Subjective:   Natalie Thornton is a 29 y.o. female here with report of UTI symptoms x 2 days.  Pt denies fever, body aches or chills.  No report of abnormal vaginal discharge. Desires STD screening.    Objective: Physical Exam  Filed Vitals:   12/06/14 2336  BP: 132/76  Pulse: 80  Temp: 98.7 F (37.1 C)  Resp: 18   Constitutional: She is oriented to person, place, and time. She appears well-developed and well-nourished. No distress.  Neck: Normal range of motion.  Pulmonary/Chest: Effort normal. No respiratory distress.  Abdomen:  NT with palpation Musculoskeletal: Normal range of motion. CVAT negative. Neurological: She is alert and oriented to person, place, and time.  Skin: Skin is warm and dry.   Results for orders placed or performed during the hospital encounter of 12/06/14 (from the past 24 hour(s))  Urinalysis, Routine w reflex microscopic (not at [facility])     Status: Abnormal   Collection Time: 12/06/14 11:45 PM  Result Value Ref Range   Color, Urine YELLOW YELLOW   APPearance CLEAR CLEAR   Specific Gravity, Urine 1.025 1.005 - 1.030   pH 6.0 5.0 - 8.0   Glucose, UA NEGATIVE NEGATIVE mg/dL   Hgb urine dipstick LARGE (A) NEGATIVE   Bilirubin Urine NEGATIVE NEGATIVE   Ketones, ur NEGATIVE NEGATIVE mg/dL   Protein, ur NEGATIVE NEGATIVE mg/dL   Urobilinogen, UA 0.2 0.0 - 1.0 mg/dL   Nitrite NEGATIVE NEGATIVE   Leukocytes, UA SMALL (A) NEGATIVE  Urine microscopic-add on     Status: None   Collection Time: 12/06/14 11:45 PM  Result Value Ref Range   Squamous Epithelial / LPF RARE RARE   WBC, UA 21-50 <3 WBC/hpf   RBC / HPF 11-20 <3 RBC/hpf   Bacteria, UA RARE RARE   Urine-Other MUCOUS PRESENT   Pregnancy, urine POC     Status: None   Collection Time: 12/06/14 11:47 PM  Result Value Ref Range   Preg Test, Ur NEGATIVE NEGATIVE  Wet prep, genital     Status: Abnormal   Collection Time: 12/07/14 12:20 AM  Result Value Ref Range   Yeast Wet Prep HPF POC NONE SEEN NONE  SEEN   Trich, Wet Prep NONE SEEN NONE SEEN   Clue Cells Wet Prep HPF POC NONE SEEN NONE SEEN   WBC, Wet Prep HPF POC FEW (A) NONE SEEN    Assessment: UTI STD screen  Plan: Discharge to home Macrobid 100 mg BID x 5 days. Urine culture sent to lab Follow-up if worsening or no improvement in symptoms in 48 hours.  Venia Carbon Michiel Cowboy, CNM

## 2014-12-07 NOTE — Progress Notes (Signed)
Reina Fuse CNM in earlier to discuss test results and d/c plan. Written and verbal d/c instructions given and understanding voiced

## 2014-12-09 LAB — URINE CULTURE: Culture: >=100000

## 2014-12-09 LAB — GC/CHLAMYDIA PROBE AMP (~~LOC~~) NOT AT ARMC
Chlamydia: NEGATIVE
Neisseria Gonorrhea: NEGATIVE

## 2014-12-10 ENCOUNTER — Telehealth (HOSPITAL_COMMUNITY): Payer: Self-pay | Admitting: Obstetrics and Gynecology

## 2014-12-10 MED ORDER — SULFAMETHOXAZOLE-TRIMETHOPRIM 800-160 MG PO TABS: 1.0000 | ORAL_TABLET | Freq: Two times a day (BID) | ORAL | Status: AC

## 2014-12-10 NOTE — Telephone Encounter (Signed)
The patient is not available and her cell phone is off. I spoke to the patient's mother and asked her to please relay a message to have the patient call [phone] when she is able too.   Patient needs keflex for UTI; patient currently taking macrobid, however urine culture came back resistant.

## 2014-12-12 ENCOUNTER — Other Ambulatory Visit: Payer: Self-pay | Admitting: Family

## 2015-03-28 ENCOUNTER — Emergency Department (HOSPITAL_COMMUNITY): Payer: No Typology Code available for payment source

## 2015-03-28 ENCOUNTER — Emergency Department (HOSPITAL_COMMUNITY)
Admission: EM | Admit: 2015-03-28 | Discharge: 2015-03-29 | Disposition: A | Discharge status: Home / Self Care | Payer: No Typology Code available for payment source | Attending: Emergency Medicine | Admitting: Emergency Medicine

## 2015-03-28 ENCOUNTER — Encounter (HOSPITAL_COMMUNITY): Payer: Self-pay | Admitting: Oncology

## 2015-03-28 DIAGNOSIS — Y998 Other external cause status: Secondary | ICD-10-CM | POA: Diagnosis not present

## 2015-03-28 DIAGNOSIS — Z8659 Personal history of other mental and behavioral disorders: Secondary | ICD-10-CM | POA: Insufficient documentation

## 2015-03-28 DIAGNOSIS — Z720 Tobacco use: Secondary | ICD-10-CM | POA: Diagnosis not present

## 2015-03-28 DIAGNOSIS — Z8739 Personal history of other diseases of the musculoskeletal system and connective tissue: Secondary | ICD-10-CM | POA: Diagnosis not present

## 2015-03-28 DIAGNOSIS — S199XXA Unspecified injury of neck, initial encounter: Secondary | ICD-10-CM | POA: Diagnosis present

## 2015-03-28 DIAGNOSIS — Z792 Long term (current) use of antibiotics: Secondary | ICD-10-CM | POA: Insufficient documentation

## 2015-03-28 DIAGNOSIS — S161XXA Strain of muscle, fascia and tendon at neck level, initial encounter: Secondary | ICD-10-CM | POA: Diagnosis not present

## 2015-03-28 DIAGNOSIS — S3991XA Unspecified injury of abdomen, initial encounter: Secondary | ICD-10-CM | POA: Insufficient documentation

## 2015-03-28 DIAGNOSIS — Y92410 Unspecified street and highway as the place of occurrence of the external cause: Secondary | ICD-10-CM | POA: Diagnosis not present

## 2015-03-28 DIAGNOSIS — Y9389 Activity, other specified: Secondary | ICD-10-CM | POA: Diagnosis not present

## 2015-03-28 DIAGNOSIS — Z872 Personal history of diseases of the skin and subcutaneous tissue: Secondary | ICD-10-CM | POA: Diagnosis not present

## 2015-03-28 DIAGNOSIS — S2001XA Contusion of right breast, initial encounter: Secondary | ICD-10-CM | POA: Insufficient documentation

## 2015-03-28 MED ORDER — HYDROCODONE-ACETAMINOPHEN 5-325 MG PO TABS
1.0000 | ORAL_TABLET | Freq: Once | ORAL | Status: AC
  Administered 2015-03-28: 1 via ORAL
  Filled 2015-03-28: qty 1

## 2015-03-28 MED ORDER — KETOROLAC TROMETHAMINE 60 MG/2ML IM SOLN
60.0000 mg | Freq: Once | INTRAMUSCULAR | Status: AC
  Administered 2015-03-28: 60 mg via INTRAMUSCULAR
  Filled 2015-03-28: qty 2
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
## 2015-03-29 MED ORDER — IBUPROFEN 600 MG PO TABS: 600.0000 mg | ORAL_TABLET | Freq: Four times a day (QID) | ORAL | Status: DC | PRN

## 2015-03-29 MED ORDER — HYDROCODONE-ACETAMINOPHEN 5-325 MG PO TABS: 1.0000 | ORAL_TABLET | Freq: Four times a day (QID) | ORAL | Status: DC | PRN

## 2015-03-29 NOTE — Discharge Instructions (Signed)
Cervical Sprain °A cervical sprain is an injury in the neck in which the strong, fibrous tissues (ligaments) that connect your neck bones stretch or tear. Cervical sprains can range from mild to severe. Severe cervical sprains can cause the neck vertebrae to be unstable. This can lead to damage of the spinal cord and can result in serious nervous system problems. The amount of time it takes for a cervical sprain to get better depends on the cause and extent of the injury. Most cervical sprains heal in 1 to 3 weeks. °CAUSES  °Severe cervical sprains may be caused by:  °· Contact sport injuries (such as from football, rugby, wrestling, hockey, auto racing, gymnastics, diving, martial arts, or boxing).   °· Motor vehicle collisions.   °· Whiplash injuries. This is an injury from a sudden forward and backward whipping movement of the head and neck.  °· Falls.   °Mild cervical sprains may be caused by:  °· Being in an awkward position, such as while cradling a telephone between your ear and shoulder.   °· Sitting in a chair that does not offer proper support.   °· Working at a poorly designed computer station.   °· Looking up or down for long periods of time.   °SYMPTOMS  °· Pain, soreness, stiffness, or a burning sensation in the front, back, or sides of the neck. This discomfort may develop immediately after the injury or slowly, 24 hours or more after the injury.   °· Pain or tenderness directly in the middle of the back of the neck.   °· Shoulder or upper back pain.   °· Limited ability to move the neck.   °· Headache.   °· Dizziness.   °· Weakness, numbness, or tingling in the hands or arms.   °· Muscle spasms.   °· Difficulty swallowing or chewing.   °· Tenderness and swelling of the neck.   °DIAGNOSIS  °Most of the time your health care provider can diagnose a cervical sprain by taking your history and doing a physical exam. Your health care provider will ask about previous neck injuries and any known neck  problems, such as arthritis in the neck. X-rays may be taken to find out if there are any other problems, such as with the bones of the neck. Other tests, such as a CT scan or MRI, may also be needed.  °TREATMENT  °Treatment depends on the severity of the cervical sprain. Mild sprains can be treated with rest, keeping the neck in place (immobilization), and pain medicines. Severe cervical sprains are immediately immobilized. Further treatment is done to help with pain, muscle spasms, and other symptoms and may include: °· Medicines, such as pain relievers, numbing medicines, or muscle relaxants.   °· Physical therapy. This may involve stretching exercises, strengthening exercises, and posture training. Exercises and improved posture can help stabilize the neck, strengthen muscles, and help stop symptoms from returning.   °HOME CARE INSTRUCTIONS  °· Put ice on the injured area.   °¨ Put ice in a plastic bag.   °¨ Place a towel between your skin and the bag.   °¨ Leave the ice on for 15-20 minutes, 3-4 times a day.   °· If your injury was severe, you may have been given a cervical collar to wear. A cervical collar is a two-piece collar designed to keep your neck from moving while it heals. °¨ Do not remove the collar unless instructed by your health care provider. °¨ If you have long hair, keep it outside of the collar. °¨ Ask your health care provider before making any adjustments to your collar. Minor   Do not remove the collar unless instructed by your health care provider.    If you have long hair, keep it outside of the collar.    Ask your health care provider before making any adjustments to your collar. Minor adjustments may be required over time to improve comfort and reduce pressure on your chin or on the back of your head.    Ifyou are allowed to remove the collar for cleaning or bathing, follow your health care provider's instructions on how to do so safely.    Keep your collar clean by wiping it with mild soap and water and drying it completely. If the collar you have been given includes removable pads, remove them every 1-2 days and hand wash them with soap and water. Allow them to air dry. They should be completely  dry before you wear them in the collar.    If you are allowed to remove the collar for cleaning and bathing, wash and dry the skin of your neck. Check your skin for irritation or sores. If you see any, tell your health care provider.    Do not drive while wearing the collar.    Only take over-the-counter or prescription medicines for pain, discomfort, or fever as directed by your health care provider.    Keep all follow-up appointments as directed by your health care provider.    Keep all physical therapy appointments as directed by your health care provider.    Make any needed adjustments to your workstation to promote good posture.    Avoid positions and activities that make your symptoms worse.    Warm up and stretch before being active to help prevent problems.   SEEK MEDICAL CARE IF:    Your pain is not controlled with medicine.    You are unable to decrease your pain medicine over time as planned.    Your activity level is not improving as expected.   SEEK IMMEDIATE MEDICAL CARE IF:    You develop any bleeding.   You develop stomach upset.   You have signs of an allergic reaction to your medicine.    Your symptoms get worse.    You develop new, unexplained symptoms.    You have numbness, tingling, weakness, or paralysis in any part of your body.   MAKE SURE YOU:    Understand these instructions.   Will watch your condition.   Will get help right away if you are not doing well or get worse.     This information is not intended to replace advice given to you by your health care provider. Make sure you discuss any questions you have with your health care provider.     Document Released: 03/20/2007 Document Revised: 05/28/2013 Document Reviewed: 11/28/2012  Elsevier Interactive Patient Education 2016 [employer]  Motor Vehicle Collision  It is common to have multiple bruises and sore muscles after a motor vehicle collision (MVC). These tend to feel worse for the first 24 hours.  You may have the most stiffness and soreness over the first several hours. You may also feel worse when you wake up the first morning after your collision. After this point, you will usually begin to improve with each day. The speed of improvement often depends on the severity of the collision, the number of injuries, and the location and nature of these injuries.  HOME CARE INSTRUCTIONS   Put ice on the injured area.   Put ice in a plastic bag.   Place   a towel between your skin and the bag.   Leave the ice on for 15-20 minutes, 3-4 times a day, or as directed by your health care provider.   Drink enough fluids to keep your urine clear or pale yellow. Do not drink alcohol.   Take a warm shower or bath once or twice a day. This will increase blood flow to sore muscles.   You may return to activities as directed by your caregiver. Be careful when lifting, as this may aggravate neck or back pain.   Only take over-the-counter or prescription medicines for pain, discomfort, or fever as directed by your caregiver. Do not use aspirin. This may increase bruising and bleeding.  SEEK IMMEDIATE MEDICAL CARE IF:   You have numbness, tingling, or weakness in the arms or legs.   You develop severe headaches not relieved with medicine.   You have severe neck pain, especially tenderness in the middle of the back of your neck.   You have changes in bowel or bladder control.   There is increasing pain in any area of the body.   You have shortness of breath, light-headedness, dizziness, or fainting.   You have chest pain.   You feel sick to your stomach (nauseous), throw up (vomit), or sweat.   You have increasing abdominal discomfort.   There is blood in your urine, stool, or vomit.   You have pain in your shoulder (shoulder strap areas).   You feel your symptoms are getting worse.  MAKE SURE YOU:   Understand these instructions.   Will watch your condition.   Will get help right away if you are not doing well  or get worse.     This information is not intended to replace advice given to you by your health care provider. Make sure you discuss any questions you have with your health care provider.     Document Released: 05/23/2005 Document Revised: 06/13/2014 Document Reviewed: 10/20/2010  Elsevier Interactive Patient Education 2016 [employer]

## 2015-05-21 ENCOUNTER — Encounter (HOSPITAL_COMMUNITY): Payer: Self-pay | Admitting: Emergency Medicine

## 2015-05-21 ENCOUNTER — Emergency Department (HOSPITAL_COMMUNITY)
Admission: EM | Admit: 2015-05-21 | Discharge: 2015-05-21 | Disposition: A | Discharge status: Home / Self Care | Payer: Self-pay | Attending: Emergency Medicine | Admitting: Emergency Medicine

## 2015-05-21 DIAGNOSIS — M1990 Unspecified osteoarthritis, unspecified site: Secondary | ICD-10-CM | POA: Insufficient documentation

## 2015-05-21 DIAGNOSIS — Z8659 Personal history of other mental and behavioral disorders: Secondary | ICD-10-CM | POA: Insufficient documentation

## 2015-05-21 DIAGNOSIS — F17210 Nicotine dependence, cigarettes, uncomplicated: Secondary | ICD-10-CM | POA: Insufficient documentation

## 2015-05-21 DIAGNOSIS — Z872 Personal history of diseases of the skin and subcutaneous tissue: Secondary | ICD-10-CM | POA: Insufficient documentation

## 2015-05-21 DIAGNOSIS — D171 Benign lipomatous neoplasm of skin and subcutaneous tissue of trunk: Secondary | ICD-10-CM

## 2015-05-21 DIAGNOSIS — D1779 Benign lipomatous neoplasm of other sites: Secondary | ICD-10-CM | POA: Insufficient documentation

## 2015-05-21 NOTE — ED Notes (Signed)
Pt states she has a knot on her chest above her right breast  Pt states she noticed it a couple of days ago

## 2015-05-21 NOTE — ED Provider Notes (Signed)
CSN: [id number]     Arrival date & time 05/21/15  2320 History   First MD Initiated Contact with Patient 05/21/15 2331     Chief Complaint  Patient presents with  . Breast Problem     (Consider location/radiation/quality/duration/timing/severity/associated sxs/prior Treatment) HPI Comments: Patient with no significant medical history presents for evaluation of a mass she noticed 3 days ago on her right chest wall. There is no complaint of pain, redness, blister or injury. No SOB, or other chest pain. No fever.   The history is provided by the patient. No language interpreter was used.    Past Medical History  Diagnosis Date  . Pilonidal cyst   . DDD (degenerative disc disease)   . Arthritis   . Anxiety    Past Surgical History  Procedure Laterality Date  . No past surgeries     Family History  Problem Relation Age of Onset  . Cancer Maternal Grandmother     breast   Social History  Substance Use Topics  . Smoking status: Current Some Day Smoker -- 0.50 packs/day    Types: Cigarettes  . Smokeless tobacco: Never Used  . Alcohol Use: 1.2 oz/week    2 Shots of liquor per week     Comment: weekends   OB History    No data available     Review of Systems  Constitutional: Negative for fever and chills.  Respiratory: Negative.  Negative for shortness of breath.   Cardiovascular: Negative.  Negative for chest pain.  Skin:       See HPI.      Allergies  Review of patient's allergies indicates no known allergies.  Home Medications   Prior to Admission medications   Medication Sig Start Date End Date Taking? Authorizing Provider  acetaminophen (TYLENOL) 500 MG tablet Take 1,000 mg by mouth every 6 (six) hours as needed for mild pain.    Historical Provider, MD  HYDROcodone-acetaminophen (NORCO/VICODIN) 5-325 MG tablet Take 1 tablet by mouth every 6 (six) hours as needed for moderate pain. 03/29/15   Merryl Hacker, MD  ibuprofen (ADVIL,MOTRIN) 600 MG tablet Take  1 tablet (600 mg total) by mouth every 6 (six) hours as needed. 03/29/15   Merryl Hacker, MD  nitrofurantoin, macrocrystal-monohydrate, (MACROBID) 100 MG capsule Take 1 capsule (100 mg total) by mouth 2 (two) times daily. 12/07/14   [name], CNM   BP 121/78 mmHg  Pulse 84  Temp(Src) 98.4 F (36.9 C) (Oral)  Resp 16  Ht 4\' 11"  (1.499 m)  Wt 63.107 kg  BMI 28.08 kg/m2  SpO2 100%  LMP 04/29/2015 (Exact Date) Physical Exam  Constitutional: She is oriented to person, place, and time. She appears well-developed and well-nourished.  Neck: Normal range of motion.  Pulmonary/Chest: Effort normal.  Small, soft, mobile, nontender mass to right chest wall adjacent to sternum.  Neurological: She is alert and oriented to person, place, and time.  Skin: Skin is warm and dry.    ED Course  Procedures (including critical care time) Labs Review Labs Reviewed - No data to display  Imaging Review No results found. I have personally reviewed and evaluated these images and lab results as part of my medical decision-making.   EKG Interpretation None      MDM   Final diagnoses:  Lipoma of breast    Suspect lipoma of chest wall without tenderness, redness or blistering. Return precautions discussed.    Charlann Lange, PA-C 05/21/15 2344  Varney Biles,  MD 05/22/15 [phone]

## 2015-05-21 NOTE — Discharge Instructions (Signed)

## 2015-06-03 ENCOUNTER — Emergency Department (HOSPITAL_COMMUNITY)
Admission: EM | Admit: 2015-06-03 | Discharge: 2015-06-04 | Disposition: A | Discharge status: Home / Self Care | Payer: Self-pay | Attending: Emergency Medicine | Admitting: Emergency Medicine

## 2015-06-03 ENCOUNTER — Encounter (HOSPITAL_COMMUNITY): Payer: Self-pay | Admitting: Emergency Medicine

## 2015-06-03 DIAGNOSIS — Z79899 Other long term (current) drug therapy: Secondary | ICD-10-CM | POA: Insufficient documentation

## 2015-06-03 DIAGNOSIS — Z8659 Personal history of other mental and behavioral disorders: Secondary | ICD-10-CM | POA: Insufficient documentation

## 2015-06-03 DIAGNOSIS — F17210 Nicotine dependence, cigarettes, uncomplicated: Secondary | ICD-10-CM | POA: Insufficient documentation

## 2015-06-03 DIAGNOSIS — R0789 Other chest pain: Secondary | ICD-10-CM | POA: Insufficient documentation

## 2015-06-03 DIAGNOSIS — M1990 Unspecified osteoarthritis, unspecified site: Secondary | ICD-10-CM | POA: Insufficient documentation

## 2015-06-03 DIAGNOSIS — Z872 Personal history of diseases of the skin and subcutaneous tissue: Secondary | ICD-10-CM | POA: Insufficient documentation

## 2015-06-03 DIAGNOSIS — R110 Nausea: Secondary | ICD-10-CM | POA: Insufficient documentation

## 2015-06-03 DIAGNOSIS — Z86018 Personal history of other benign neoplasm: Secondary | ICD-10-CM | POA: Insufficient documentation

## 2015-06-03 MED ORDER — ACETAMINOPHEN 325 MG PO TABS
975.0000 mg | ORAL_TABLET | Freq: Once | ORAL | Status: AC
Start: 2015-06-03 — End: 2015-06-04
  Administered 2015-06-04: 975 mg via ORAL
  Filled 2015-06-03: qty 3

## 2015-06-03 MED ORDER — ONDANSETRON 4 MG PO TBDP
4.0000 mg | ORAL_TABLET | Freq: Once | ORAL | Status: AC
  Administered 2015-06-04: 4 mg via ORAL
  Filled 2015-06-03: qty 1

## 2015-06-03 MED ORDER — ONDANSETRON HCL 4 MG PO TABS: 4.0000 mg | ORAL_TABLET | Freq: Four times a day (QID) | ORAL | Status: DC | PRN

## 2015-06-03 NOTE — ED Provider Notes (Signed)
CSN: [id number]     Arrival date & time 06/03/15  2221 History  By signing my name below, I, Natalie Thornton, attest that this documentation has been prepared under the direction and in the presence of [employer], PA-C. Electronically Signed: Randa Thornton, ED Scribe. 06/03/2015. 11:33 PM.    Chief Complaint  Patient presents with  . Breast Mass    The history is provided by the patient. No language interpreter was used.   HPI Comments: Natalie Thornton is a 29 y.o. female who presents to the Emergency Department complaining of worsening right sided chest wall mass. She states that she thinks the mass is larger and more firm. Pt does report some nausea as well. Pt states that the pain is worse when laying down. Pt was recently seen on 12/15 and was diagnosed with lipoma. Denies cough, SOB or fever  Past Medical History  Diagnosis Date  . Pilonidal cyst   . DDD (degenerative disc disease)   . Arthritis   . Anxiety    Past Surgical History  Procedure Laterality Date  . No past surgeries     Family History  Problem Relation Age of Onset  . Cancer Maternal Grandmother     breast   Social History  Substance Use Topics  . Smoking status: Current Some Day Smoker -- 0.50 packs/day    Types: Cigarettes  . Smokeless tobacco: Never Used  . Alcohol Use: 1.2 oz/week    2 Shots of liquor per week     Comment: weekends   OB History    No data available     Review of Systems A complete 10 system review of systems was obtained and all systems are negative except as noted in the HPI and PMH.     Allergies  Review of patient's allergies indicates no known allergies.  Home Medications   Prior to Admission medications   Medication Sig Start Date End Date Taking? Authorizing Provider  acetaminophen (TYLENOL) 500 MG tablet Take 1,000 mg by mouth every 6 (six) hours as needed for mild pain.    Historical Provider, MD  HYDROcodone-acetaminophen (NORCO/VICODIN) 5-325 MG tablet  Take 1 tablet by mouth every 6 (six) hours as needed for moderate pain. 03/29/15   Merryl Hacker, MD  ibuprofen (ADVIL,MOTRIN) 600 MG tablet Take 1 tablet (600 mg total) by mouth every 6 (six) hours as needed. 03/29/15   Merryl Hacker, MD  nitrofurantoin, macrocrystal-monohydrate, (MACROBID) 100 MG capsule Take 1 capsule (100 mg total) by mouth 2 (two) times daily. 12/07/14   [name], CNM   BP 132/79 mmHg  Pulse 76  Temp(Src) 98.5 F (36.9 C) (Oral)  Resp 18  Ht 4\' 11"  (1.499 m)  Wt 139 lb (63.05 kg)  BMI 28.06 kg/m2  SpO2 100%  LMP 05/26/2015 (Approximate)   Physical Exam  Constitutional: She is oriented to person, place, and time. She appears well-developed and well-nourished. No distress.  HENT:  Head: Normocephalic and atraumatic.  Mouth/Throat: Oropharynx is clear and moist.  Eyes: Conjunctivae and EOM are normal.  Neck: Neck supple. No tracheal deviation present.  Cardiovascular: Normal rate, regular rhythm and intact distal pulses.   Pulmonary/Chest: Effort normal and breath sounds normal. No respiratory distress. She has no wheezes. She has no rales. She exhibits tenderness.    Abdominal: Soft. Bowel sounds are normal. She exhibits no distension and no mass. There is no tenderness. There is no rebound and no guarding.  Musculoskeletal: Normal range of motion.  Neurological: She is alert and oriented to person, place, and time.  Skin: Skin is warm and dry.  Psychiatric: She has a normal mood and affect. Her behavior is normal.  Nursing note and vitals reviewed.   ED Course  Procedures (including critical care time) DIAGNOSTIC STUDIES: Oxygen Saturation is 100% on RA, normal by my interpretation.    COORDINATION OF CARE: 11:41 PM-Discussed treatment plan with pt at bedside and pt agreed to plan.     Labs Review Labs Reviewed - No data to display  Imaging Review No results found.    EKG Interpretation None      MDM   Final diagnoses:  None       Filed Vitals:   06/03/15 2228  BP: 132/79  Pulse: 76  Temp: 98.5 F (36.9 C)  TempSrc: Oral  Resp: 18  Height: 4\' 11"  (1.499 m)  Weight: 63.05 kg  SpO2: 100%    Medications  ondansetron (ZOFRAN-ODT) disintegrating tablet 4 mg (not administered)  acetaminophen (TYLENOL) tablet 975 mg (not administered)    Natalie Thornton is 29 y.o. female presenting with possible increase in size and discomfort of anterior chest swelling. No discrete mass appreciated on my exam, ultrasound performed with out fluid collection, no overlying skin changes. I think patient needs NSAIDs and ice, general surgery referral given.   Evaluation does not show pathology that would require ongoing emergent intervention or inpatient treatment. Pt is hemodynamically stable and mentating appropriately. Discussed findings and plan with patient/guardian, who agrees with care plan. All questions answered. Return precautions discussed and outpatient follow up given.   New Prescriptions   ONDANSETRON (ZOFRAN) 4 MG TABLET    Take 1 tablet (4 mg total) by mouth every 6 (six) hours as needed for nausea or vomiting.     I personally performed the services described in this documentation, which was scribed in my presence.  The recorded information has been reviewed and is accurate.      Monico Blitz, PA-C 06/04/15 0014  Veatrice Kells, MD 06/04/15 [phone]

## 2015-06-03 NOTE — ED Notes (Signed)
Pt states she has a mass on her right chest wall  Pt states she was seen for it on the 15th and was told it was a lipoma and was told to return if it changed in any way  Pt states it is hard and tender and maybe a little bigger

## 2015-06-03 NOTE — Discharge Instructions (Signed)
Rest, Ice intermittently (in the first 24-48 hours), Gentle compression with an Ace wrap, and elevate (Limb above the level of the heart)   Take up to 800mg  of ibuprofen (that is usually 4 over the counter pills)  3 times a day for 5 days. Take with food.   Chest Wall Pain Chest wall pain is pain in or around the bones and muscles of your chest. Sometimes, an injury causes this pain. Sometimes, the cause may not be known. This pain may take several weeks or longer to get better. HOME CARE Pay attention to any changes in your symptoms. Take these actions to help with your pain:  Rest as told by your doctor.  Avoid activities that cause pain. Try not to use your chest, belly (abdominal), or side muscles to lift heavy things.  If directed, apply ice to the painful area:  Put ice in a plastic bag.  Place a towel between your skin and the bag.  Leave the ice on for 20 minutes, 2-3 times per day.  Take over-the-counter and prescription medicines only as told by your doctor.  Do not use tobacco products, including cigarettes, chewing tobacco, and e-cigarettes. If you need help quitting, ask your doctor.  Keep all follow-up visits as told by your doctor. This is important. GET HELP IF:  You have a fever.  Your chest pain gets worse.  You have new symptoms. GET HELP RIGHT AWAY IF:  You feel sick to your stomach (nauseous) or you throw up (vomit).  You feel sweaty or light-headed.  You have a cough with phlegm (sputum) or you cough up blood.  You are short of breath.   This information is not intended to replace advice given to you by your health care provider. Make sure you discuss any questions you have with your health care provider.   Document Released: 11/09/2007 Document Revised: 02/11/2015 Document Reviewed: 08/18/2014 Elsevier Interactive Patient Education [employer].

## 2015-07-14 ENCOUNTER — Emergency Department (HOSPITAL_COMMUNITY): Payer: Self-pay

## 2015-07-14 ENCOUNTER — Encounter (HOSPITAL_COMMUNITY): Payer: Self-pay

## 2015-07-14 ENCOUNTER — Emergency Department (HOSPITAL_COMMUNITY)
Admission: EM | Admit: 2015-07-14 | Discharge: 2015-07-15 | Disposition: A | Discharge status: Home / Self Care | Payer: Self-pay | Attending: Emergency Medicine | Admitting: Emergency Medicine

## 2015-07-14 DIAGNOSIS — Z872 Personal history of diseases of the skin and subcutaneous tissue: Secondary | ICD-10-CM | POA: Insufficient documentation

## 2015-07-14 DIAGNOSIS — Z8659 Personal history of other mental and behavioral disorders: Secondary | ICD-10-CM | POA: Insufficient documentation

## 2015-07-14 DIAGNOSIS — Z792 Long term (current) use of antibiotics: Secondary | ICD-10-CM | POA: Insufficient documentation

## 2015-07-14 DIAGNOSIS — M1990 Unspecified osteoarthritis, unspecified site: Secondary | ICD-10-CM | POA: Insufficient documentation

## 2015-07-14 DIAGNOSIS — R079 Chest pain, unspecified: Secondary | ICD-10-CM | POA: Insufficient documentation

## 2015-07-14 DIAGNOSIS — R222 Localized swelling, mass and lump, trunk: Secondary | ICD-10-CM | POA: Insufficient documentation

## 2015-07-14 DIAGNOSIS — F17210 Nicotine dependence, cigarettes, uncomplicated: Secondary | ICD-10-CM | POA: Insufficient documentation

## 2015-07-14 MED ORDER — IBUPROFEN 400 MG PO TABS
800.0000 mg | ORAL_TABLET | Freq: Once | ORAL | Status: AC
  Administered 2015-07-15: 800 mg via ORAL
  Filled 2015-07-14: qty 2

## 2015-07-14 NOTE — ED Provider Notes (Signed)
CSN: [id number]     Arrival date & time 07/14/15  2307 History  By signing my name below, I, Natalie Thornton, attest that this documentation has been prepared under the direction and in the presence of S. Wendie Simmer, PA-C. Electronically Signed: Stephania Thornton, ED Scribe. 07/14/2015. 12:01 AM.    Chief Complaint  Patient presents with  . bump on chest    The history is provided by the patient. No language interpreter was used.    HPI Comments: Natalie Thornton is a 30 y.o. female with a history of pilonidal cyst, DDD, arthritis, and anxiety, who presents to the Emergency Department complaining of a gradual-onset, constant, gradually growing painful lump on her right upper chest that she first noticed 1 month ago. Patient has not tried any treatments or medications for this. Nothing makes her symptoms better or worse. She denies fever or unexpected weight loss.   Wt Readings from Last 3 Encounters:  07/14/15 143 lb 8 oz (65.091 kg)  06/03/15 139 lb (63.05 kg)  05/21/15 139 lb 2 oz (63.107 kg)      Past Medical History  Diagnosis Date  . Pilonidal cyst   . DDD (degenerative disc disease)   . Arthritis   . Anxiety    Past Surgical History  Procedure Laterality Date  . No past surgeries     Family History  Problem Relation Age of Onset  . Cancer Maternal Grandmother     breast   Social History  Substance Use Topics  . Smoking status: Current Some Day Smoker -- 0.50 packs/day    Types: Cigarettes  . Smokeless tobacco: Never Used  . Alcohol Use: 1.2 oz/week    2 Shots of liquor per week     Comment: weekends   OB History    No data available     Review of Systems  Constitutional: Negative for fever and unexpected weight change.  Skin:       Positive for painful lump on right upper chest  Ten systems are reviewed and are negative for acute change except as noted in the HPI   Allergies  Review of patient's allergies indicates no known allergies.  Home Medications   Prior to  Admission medications   Medication Sig Start Date End Date Taking? Authorizing Provider  acetaminophen (TYLENOL) 500 MG tablet Take 1,000 mg by mouth every 6 (six) hours as needed for mild pain.    Historical Provider, MD  HYDROcodone-acetaminophen (NORCO/VICODIN) 5-325 MG tablet Take 1 tablet by mouth every 6 (six) hours as needed for moderate pain. 03/29/15   Merryl Hacker, MD  ibuprofen (ADVIL,MOTRIN) 600 MG tablet Take 1 tablet (600 mg total) by mouth every 6 (six) hours as needed. 03/29/15   Merryl Hacker, MD  nitrofurantoin, macrocrystal-monohydrate, (MACROBID) 100 MG capsule Take 1 capsule (100 mg total) by mouth 2 (two) times daily. 12/07/14   [name], CNM  ondansetron (ZOFRAN) 4 MG tablet Take 1 tablet (4 mg total) by mouth every 6 (six) hours as needed for nausea or vomiting. 06/03/15   [name], PA-C   BP 116/76 mmHg  Pulse 75  Temp(Src) 98.3 F (36.8 C) (Oral)  Resp 16  Ht 4\' 11"  (1.499 m)  Wt 143 lb 8 oz (65.091 kg)  BMI 28.97 kg/m2  SpO2 99% Physical Exam  Constitutional: She is oriented to person, place, and time. She appears well-developed and well-nourished. No distress.  HENT:  Head: Normocephalic and atraumatic.  Eyes: Conjunctivae and EOM  are normal.  Neck: Neck supple. No tracheal deviation present.  Cardiovascular: Normal rate, regular rhythm and normal heart sounds.  Exam reveals no gallop and no friction rub.   No murmur heard. Pulmonary/Chest: Effort normal. No respiratory distress. She has no wheezes. She has no rales. She exhibits tenderness and swelling.    Musculoskeletal: Normal range of motion.  Minimal swelling on right upper chest  Neurological: She is alert and oriented to person, place, and time.  Skin: Skin is warm and dry.  Psychiatric: She has a normal mood and affect. Her behavior is normal.  Nursing note and vitals reviewed.   ED Course  Procedures    DIAGNOSTIC STUDIES: Oxygen Saturation is 99% on RA, normal by my  interpretation.    COORDINATION OF CARE: 11:34 PM - Discussed treatment plan with pt at bedside which includes CXR. Recommended follow-up with PCP for possible referral to general surgery or for further imaging. Pt verbalized understanding and agreed to plan.   Imaging Review Dg Chest 2 View  07/15/2015  CLINICAL DATA:  30 year old female with lump on the chest EXAM: CHEST  2 VIEW COMPARISON:  None. FINDINGS: The heart size and mediastinal contours are within normal limits. Both lungs are clear. The visualized skeletal structures are unremarkable. Ultrasound may provide better evaluation of the soft tissues of the chest wall if clinically indicated. IMPRESSION: No active cardiopulmonary disease. Electronically Signed   By: Anner Crete M.D.   On: 07/15/2015 00:26   I have personally reviewed and evaluated these images and lab results as part of my medical decision-making.  MDM   Final diagnoses:  None   Patient non-toxic appearing, VSS. CXR negative. Discussed importance of OP follow-up with patient. Patient may be safely discharged home. Discussed reasons for return. Patient to follow-up with primary care provider within one week. Patient in understanding and agreement with the plan.      Shattuck Lions, PA-C 07/16/15 2113  Lajean Saver, MD 07/19/15 [phone]

## 2015-07-14 NOTE — ED Notes (Signed)
Pt is here tonight due to painful lump on upper central chest that she noticed a month ago.

## 2015-08-16 ENCOUNTER — Emergency Department (HOSPITAL_COMMUNITY): Payer: No Typology Code available for payment source

## 2015-08-16 ENCOUNTER — Encounter (HOSPITAL_COMMUNITY): Payer: Self-pay | Admitting: Emergency Medicine

## 2015-08-16 DIAGNOSIS — F17210 Nicotine dependence, cigarettes, uncomplicated: Secondary | ICD-10-CM | POA: Insufficient documentation

## 2015-08-16 DIAGNOSIS — D179 Benign lipomatous neoplasm, unspecified: Secondary | ICD-10-CM | POA: Insufficient documentation

## 2015-08-16 DIAGNOSIS — R0602 Shortness of breath: Secondary | ICD-10-CM | POA: Insufficient documentation

## 2015-08-16 DIAGNOSIS — R0789 Other chest pain: Secondary | ICD-10-CM | POA: Insufficient documentation

## 2015-08-16 DIAGNOSIS — Z8659 Personal history of other mental and behavioral disorders: Secondary | ICD-10-CM | POA: Insufficient documentation

## 2015-08-16 DIAGNOSIS — M1990 Unspecified osteoarthritis, unspecified site: Secondary | ICD-10-CM | POA: Insufficient documentation

## 2015-08-16 DIAGNOSIS — Z792 Long term (current) use of antibiotics: Secondary | ICD-10-CM | POA: Insufficient documentation

## 2015-08-16 DIAGNOSIS — Z872 Personal history of diseases of the skin and subcutaneous tissue: Secondary | ICD-10-CM | POA: Insufficient documentation

## 2015-08-16 NOTE — ED Notes (Addendum)
C/o pressure to center of chest x 3-4 days with sob.  States she feels like pain is coming from a "benign tumor" that is in her chest.  Also reports body aches.

## 2015-08-17 ENCOUNTER — Emergency Department (HOSPITAL_COMMUNITY)
Admission: EM | Admit: 2015-08-17 | Discharge: 2015-08-17 | Disposition: A | Discharge status: Home / Self Care | Payer: No Typology Code available for payment source | Attending: Emergency Medicine | Admitting: Emergency Medicine

## 2015-08-17 DIAGNOSIS — R0789 Other chest pain: Secondary | ICD-10-CM

## 2015-08-17 DIAGNOSIS — D179 Benign lipomatous neoplasm, unspecified: Secondary | ICD-10-CM

## 2015-08-17 LAB — BASIC METABOLIC PANEL
ANION GAP: 11 (ref 5–15)
BUN: 14 mg/dL (ref 6–20)
CALCIUM: 9.4 mg/dL (ref 8.9–10.3)
CO2: 23 mmol/L (ref 22–32)
Chloride: 104 mmol/L (ref 101–111)
Creatinine, Ser: 0.93 mg/dL (ref 0.44–1.00)
GFR calc Af Amer: >60 mL/min (ref >60)
Glucose, Bld: 99 mg/dL (ref 65–99)
POTASSIUM: 3.7 mmol/L (ref 3.5–5.1)
Sodium: 138 mmol/L (ref 135–145)

## 2015-08-17 LAB — CBC
HCT: 38.2 % (ref 36.0–46.0)
Hemoglobin: 12.4 g/dL (ref 12.0–15.0)
MCH: 29.5 pg (ref 26.0–34.0)
MCHC: 32.5 g/dL (ref 30.0–36.0)
MCV: 90.7 fL (ref 78.0–100.0)
PLATELETS: 242 10*3/uL (ref 150–400)
RBC: 4.21 MIL/uL (ref 3.87–5.11)
RDW: 12.6 % (ref 11.5–15.5)
WBC: 6.3 10*3/uL (ref 4.0–10.5)

## 2015-08-17 LAB — I-STAT TROPONIN, ED: TROPONIN I, POC: 0.01 ng/mL (ref 0.00–0.08)

## 2015-08-17 MED ORDER — HYDROCODONE-ACETAMINOPHEN 5-325 MG PO TABS: 1.0000 | ORAL_TABLET | Freq: Once | ORAL | Status: DC

## 2015-08-17 MED ORDER — HYDROCODONE-ACETAMINOPHEN 5-325 MG PO TABS
1.0000 | ORAL_TABLET | Freq: Once | ORAL | Status: AC
  Administered 2015-08-17: 1 via ORAL
  Filled 2015-08-17: qty 1

## 2015-08-17 MED ORDER — HYDROCODONE-ACETAMINOPHEN 5-325 MG PO TABS: 1.0000 | ORAL_TABLET | Freq: Four times a day (QID) | ORAL | Status: DC | PRN

## 2015-08-17 MED ORDER — SULFAMETHOXAZOLE-TRIMETHOPRIM 800-160 MG PO TABS: 1.0000 | ORAL_TABLET | Freq: Two times a day (BID) | ORAL | Status: AC

## 2015-08-17 NOTE — Discharge Instructions (Signed)
Chest Wall Pain °Chest wall pain is pain in or around the bones and muscles of your chest. Sometimes, an injury causes this pain. Sometimes, the cause may not be known. This pain may take several weeks or longer to get better. °HOME CARE INSTRUCTIONS  °Pay attention to any changes in your symptoms. Take these actions to help with your pain:  °· Rest as told by your health care provider.   °· Avoid activities that cause pain. These include any activities that use your chest muscles or your abdominal and side muscles to lift heavy items.    °· If directed, apply ice to the painful area: °· Put ice in a plastic bag. °· Place a towel between your skin and the bag. °· Leave the ice on for 20 minutes, 2-3 times per day. °· Take over-the-counter and prescription medicines only as told by your health care provider. °· Do not use tobacco products, including cigarettes, chewing tobacco, and e-cigarettes. If you need help quitting, ask your health care provider. °· Keep all follow-up visits as told by your health care provider. This is important. °SEEK MEDICAL CARE IF: °· You have a fever. °· Your chest pain becomes worse. °· You have new symptoms. °SEEK IMMEDIATE MEDICAL CARE IF: °· You have nausea or vomiting. °· You feel sweaty or light-headed. °· You have a cough with phlegm (sputum) or you cough up blood. °· You develop shortness of breath. °  °This information is not intended to replace advice given to you by your health care provider. Make sure you discuss any questions you have with your health care provider. °  °Document Released: 05/23/2005 Document Revised: 02/11/2015 Document Reviewed: 08/18/2014 °Elsevier Interactive Patient Education ©2016 [employer] ° °Lipoma °A lipoma is a noncancerous (benign) tumor that is made up of fat cells. This is a very common type of soft-tissue growth. Lipomas are usually found under the skin (subcutaneous). They may occur in any tissue of the body that contains fat. Common areas  for lipomas to appear include the back, shoulders, buttocks, and thighs. Lipomas grow slowly, and they are usually painless. Most lipomas do not cause problems and do not require treatment. °CAUSES °The cause of this condition is not known. °RISK FACTORS °This condition is more likely to develop in: °· People who are 40-60 years old. °· People who have a family history of lipomas. °SYMPTOMS °A lipoma usually appears as a small, round bump under the skin. It may feel soft or rubbery, but the firmness can vary. Most lipomas are not painful. However, a lipoma may become painful if it is located in an area where it pushes on nerves. °DIAGNOSIS °A lipoma can usually be diagnosed with a physical exam. You may also have tests to confirm the diagnosis and to rule out other conditions. Tests may include: °· Imaging tests, such as a CT scan or MRI. °· Removal of a tissue sample to be looked at under a microscope (biopsy). °TREATMENT °Treatment is not needed for small lipomas that are not causing problems. If a lipoma continues to get bigger or it causes problems, removal is often the best option. Lipomas can also be removed to improve appearance. Removal of a lipoma is usually done with a surgery in which the fatty cells and the surrounding capsule are removed. Most often, a medicine that numbs the area (local anesthetic) is used for this procedure. °HOME CARE INSTRUCTIONS °· Keep all follow-up visits as directed by your health care provider. This is important. °SEEK MEDICAL   CARE IF: °· Your lipoma becomes larger or hard. °· Your lipoma becomes painful, red, or increasingly swollen. These could be signs of infection or a more serious condition. °  °This information is not intended to replace advice given to you by your health care provider. Make sure you discuss any questions you have with your health care provider. °  °Document Released: 05/13/2002 Document Revised: 10/07/2014 Document Reviewed: 05/19/2014 °Elsevier  Interactive Patient Education ©2016 Elsevier Inc. ° °

## 2015-08-17 NOTE — ED Notes (Signed)
Pt departed in NAD.  

## 2015-08-17 NOTE — ED Provider Notes (Signed)
CSN: [id number]     Arrival date & time 08/16/15  2241 History  By signing my name below, I, Natalie Thornton, attest that this documentation has been prepared under the direction and in the presence of Merryl Hacker, MD. Electronically Signed: [name], ED Scribe. 08/17/2015. 3:28 AM.    Chief Complaint  Patient presents with  . Chest Pain      The history is provided by the patient. No language interpreter was used.   HPI Comments: Natalie Thornton is a 30 y.o. female who presents to the Emergency Department complaining of constant, gradual worsening, 10/10, moderate, sharp, pressure-like chest pain with associated pain while breathing and shortness of breath onset 3 days ago. She reports having a benign tumor for the past two months in the area of her chest pain. Pt endorses that movement increases her pain.  Reports shortness of breath when the pain gets too much. Pt reports taking ibuprofen to alleviate her pain with no relief. She denies weakness and leg swelling. Denies any fevers or cough.  Past Medical History  Diagnosis Date  . Pilonidal cyst   . DDD (degenerative disc disease)   . Arthritis   . Anxiety    Past Surgical History  Procedure Laterality Date  . No past surgeries     Family History  Problem Relation Age of Onset  . Cancer Maternal Grandmother     breast   Social History  Substance Use Topics  . Smoking status: Current Some Day Smoker -- 0.50 packs/day    Types: Cigarettes  . Smokeless tobacco: Never Used  . Alcohol Use: 1.2 oz/week    2 Shots of liquor per week     Comment: weekends   OB History    No data available     Review of Systems  Constitutional: Negative for fever.  Respiratory: Positive for shortness of breath. Negative for cough.   Cardiovascular: Positive for chest pain. Negative for leg swelling.  Gastrointestinal: Negative for nausea and vomiting.  All other systems reviewed and are negative.     Allergies  Review of  patient's allergies indicates no known allergies.  Home Medications   Prior to Admission medications   Medication Sig Start Date End Date Taking? Authorizing Provider  acetaminophen (TYLENOL) 500 MG tablet Take 1,000 mg by mouth every 6 (six) hours as needed for mild pain.    Historical Provider, MD  HYDROcodone-acetaminophen (NORCO/VICODIN) 5-325 MG tablet Take 1 tablet by mouth every 6 (six) hours as needed for moderate pain. 08/17/15   Merryl Hacker, MD  ibuprofen (ADVIL,MOTRIN) 600 MG tablet Take 1 tablet (600 mg total) by mouth every 6 (six) hours as needed. 03/29/15   Merryl Hacker, MD  nitrofurantoin, macrocrystal-monohydrate, (MACROBID) 100 MG capsule Take 1 capsule (100 mg total) by mouth 2 (two) times daily. 12/07/14   [name], CNM  ondansetron (ZOFRAN) 4 MG tablet Take 1 tablet (4 mg total) by mouth every 6 (six) hours as needed for nausea or vomiting. 06/03/15   [name], PA-C  sulfamethoxazole-trimethoprim (BACTRIM DS,SEPTRA DS) 800-160 MG tablet Take 1 tablet by mouth 2 (two) times daily. 08/17/15 08/24/15  Merryl Hacker, MD   Triage vitals: BP 113/62 mmHg  Pulse 70  Temp(Src) 99 F (37.2 C) (Oral)  Resp 16  Ht 4\' 11"  (1.499 m)  Wt 143 lb 7 oz (65.063 kg)  BMI 28.96 kg/m2  SpO2 97%  LMP 07/27/2015 Physical Exam  Constitutional: She is oriented to  person, place, and time. She appears well-developed and well-nourished. No distress.  HENT:  Head: Normocephalic and atraumatic.  Neck: Neck supple.  Cardiovascular: Normal rate, regular rhythm and normal heart sounds.   No murmur heard. Pulmonary/Chest: Effort normal and breath sounds normal. No respiratory distress. She has no wheezes. She exhibits tenderness.  Small less than 1 cm palpable nodule just right of the sternum, soft, mobile, tender to palpation, no overlying skin changes  Abdominal: Soft. Bowel sounds are normal. There is no tenderness. There is no rebound.  Musculoskeletal: She exhibits no  edema.  Neurological: She is alert and oriented to person, place, and time.  Skin: Skin is warm and dry.  Psychiatric: She has a normal mood and affect.  Nursing note and vitals reviewed.   ED Course  Procedures   EMERGENCY DEPARTMENT US SOFT TISSUE INTERPRETATION "Study: Limited Ultrasound of the noted body part in comments below"  INDICATIONS: Pain Multiple views of the body part are obtained with a multi-frequency linear probe  PERFORMED BY:  Myself  IMAGES ARCHIVED?: Yes  SIDE:Midline  BODY PART:Chest Wall  FINDINGS: Other 0.5 x 1 centimeter area of hypoechoic material, no Doppler flow  LIMITATIONS:  Emergent Procedure  INTERPRETATION:  Indeterminate  COMMENT:  Patient has a small 0.5 x 1 cm hypoechoic region just right of the sternum, unclear whether this is true abscess, it is small and nonpalpable from the skin surface  DIAGNOSTIC STUDIES: Oxygen Saturation is 97% on RA, normal by my interpretation.  COORDINATION OF CARE:  3:27 AM Discussed treatment plan with pt at bedside and pt agreed to plan.  Labs Review Labs Reviewed  BASIC METABOLIC PANEL  CBC  I-STAT [place], ED    Imaging Review Dg Chest 2 View  08/17/2015  CLINICAL DATA:  Right-sided chest pain tonight. EXAM: CHEST  2 VIEW COMPARISON:  07/14/2015 FINDINGS: Normal heart size and mediastinal contours. No acute infiltrate or edema. No effusion or pneumothorax. No acute osseous findings. IMPRESSION: No active cardiopulmonary disease. Electronically Signed   By: [name] M.D.   On: 08/17/2015 00:16   I have personally reviewed and evaluated these images and lab results as part of my medical decision-making.   EKG Interpretation   Date/Time:  Sunday August 16 2015 23:02:06 EDT Ventricular Rate:  80 PR Interval:  160 QRS Duration: 74 QT Interval:  362 QTC Calculation: 417 R Axis:   55 Text Interpretation:  Normal sinus rhythm Septal infarct , age  undetermined Abnormal ECG No prior for  comparison Confirmed by HORTON  MD,  COURTNEY ([id number]) on 08/17/2015 3:18:12 AM      MDM   Final diagnoses:  Chest wall pain  Lipoma    Patient presents with chest wall pain. Relates this to prior diagnosed lipoma. She is otherwise nontoxic. Afebrile. Pain is reproducible on exam. Basic lab work, EKG, and troponin sent from triage and reassuring. Chest x-ray reassuring as well. She has a very small palpable nodule on exam with no overlying skin changes. Bedside ultrasound is equivocal but given location and how small the hypoechoic region is, would not attempt any intervention at this time. Patient was given Norco. She'll be placed on Bactrim for 7 days for any possible infectious etiology although the overlying skin appears without evidence of cellulitis. Follow-up with surgery if pain persist.  [facility] follow-up for primary.  After history, exam, and medical workup I feel the patient has been appropriately medically screened and is safe for discharge home. Pertinent diagnoses were  discussed with the patient. Patient was given return precautions.  I personally performed the services described in this documentation, which was scribed in my presence. The recorded information has been reviewed and is accurate.   Merryl Hacker, MD 08/17/15 [phone]

## 2015-09-17 DIAGNOSIS — R509 Fever, unspecified: Secondary | ICD-10-CM | POA: Insufficient documentation

## 2015-09-17 DIAGNOSIS — M1990 Unspecified osteoarthritis, unspecified site: Secondary | ICD-10-CM | POA: Insufficient documentation

## 2015-09-17 DIAGNOSIS — Z8659 Personal history of other mental and behavioral disorders: Secondary | ICD-10-CM | POA: Insufficient documentation

## 2015-09-17 DIAGNOSIS — F17210 Nicotine dependence, cigarettes, uncomplicated: Secondary | ICD-10-CM | POA: Insufficient documentation

## 2015-09-17 DIAGNOSIS — Z872 Personal history of diseases of the skin and subcutaneous tissue: Secondary | ICD-10-CM | POA: Insufficient documentation

## 2015-09-17 DIAGNOSIS — D1779 Benign lipomatous neoplasm of other sites: Secondary | ICD-10-CM | POA: Insufficient documentation

## 2015-09-17 DIAGNOSIS — R61 Generalized hyperhidrosis: Secondary | ICD-10-CM | POA: Insufficient documentation

## 2015-09-18 ENCOUNTER — Encounter (HOSPITAL_COMMUNITY): Payer: Self-pay

## 2015-09-18 ENCOUNTER — Emergency Department (HOSPITAL_COMMUNITY)
Admission: EM | Admit: 2015-09-18 | Discharge: 2015-09-18 | Disposition: A | Discharge status: Home / Self Care | Payer: No Typology Code available for payment source | Attending: Emergency Medicine | Admitting: Emergency Medicine

## 2015-09-18 DIAGNOSIS — D171 Benign lipomatous neoplasm of skin and subcutaneous tissue of trunk: Secondary | ICD-10-CM

## 2015-09-18 MED ORDER — TRAMADOL HCL 50 MG PO TABS
50.0000 mg | ORAL_TABLET | Freq: Once | ORAL | Status: AC
  Administered 2015-09-18: 50 mg via ORAL
  Filled 2015-09-18: qty 1

## 2015-09-18 MED ORDER — TRAMADOL HCL 50 MG PO TABS: 50.0000 mg | ORAL_TABLET | Freq: Four times a day (QID) | ORAL | Status: DC | PRN

## 2015-09-18 NOTE — ED Notes (Signed)
Pt complains of a lump in the center of her chest that has been there about three weeks, she states that it feels sore and it has shifted a little, she also states that she's tired and weak ans feels fatigued alot

## 2015-09-18 NOTE — ED Provider Notes (Signed)
CSN: [id number]     Arrival date & time 09/17/15  2253 History  By signing my name below, I, Altamease Oiler, attest that this documentation has been prepared under the direction and in the presence of Delora Fuel, MD. Electronically Signed: Altamease Oiler, ED Scribe. 09/18/2015. 2:37 AM   Chief Complaint  Patient presents with  . Mass   The history is provided by the patient. No language interpreter was used.   Natalie Thornton is a 30 y.o. female who presents to the Emergency Department complaining of a painful lump at the chest with onset several months ago. She rates the constant pain 8/10 in severity and notes that the lump has shifted from the right side of the chest to the central chest.  She has been seen for this before but lost the contact information for the surgeon that she was to follow up with.She states that she was told to return to the ED if the area became painful. Tylenol has provided insufficient pain relief at home. Associated symptoms include fever 2 days ago and night sweats. Pt denies cough.   Past Medical History  Diagnosis Date  . Pilonidal cyst   . DDD (degenerative disc disease)   . Arthritis   . Anxiety    Past Surgical History  Procedure Laterality Date  . No past surgeries     Family History  Problem Relation Age of Onset  . Cancer Maternal Grandmother     breast   Social History  Substance Use Topics  . Smoking status: Current Some Day Smoker -- 0.50 packs/day    Types: Cigarettes  . Smokeless tobacco: Never Used  . Alcohol Use: 1.2 oz/week    2 Shots of liquor per week     Comment: weekends   OB History    No data available     Review of Systems  Constitutional: Positive for fever and diaphoresis.  Respiratory: Negative for cough.   Skin:       Painful lump at the central chest   All other systems reviewed and are negative.   Allergies  Review of patient's allergies indicates no known allergies.  Home Medications   Prior to  Admission medications   Medication Sig Start Date End Date Taking? Authorizing Provider  acetaminophen (TYLENOL) 500 MG tablet Take 1,000 mg by mouth every 6 (six) hours as needed for mild pain.   Yes Historical Provider, MD  HYDROcodone-acetaminophen (NORCO/VICODIN) 5-325 MG tablet Take 1 tablet by mouth every 6 (six) hours as needed for moderate pain. Patient not taking: Reported on 09/18/2015 08/17/15   Merryl Hacker, MD  ibuprofen (ADVIL,MOTRIN) 600 MG tablet Take 1 tablet (600 mg total) by mouth every 6 (six) hours as needed. Patient not taking: Reported on 09/18/2015 03/29/15   Merryl Hacker, MD  ondansetron (ZOFRAN) 4 MG tablet Take 1 tablet (4 mg total) by mouth every 6 (six) hours as needed for nausea or vomiting. Patient not taking: Reported on 09/18/2015 06/03/15   [name], PA-C   BP 125/81 mmHg  Pulse 70  Temp(Src) 98.2 F (36.8 C) (Oral)  Resp 16  SpO2 100%  LMP 08/25/2015 Physical Exam  Constitutional: She is oriented to person, place, and time. She appears well-developed and well-nourished. No distress.  HENT:  Head: Normocephalic and atraumatic.  Eyes: EOM are normal. Pupils are equal, round, and reactive to light.  Neck: Normal range of motion. Neck supple. No JVD present.  Cardiovascular: Normal rate, regular rhythm and normal  heart sounds.   No murmur heard. Pulmonary/Chest: Effort normal and breath sounds normal. She has no wheezes. She has no rales.  2 cm subcutaneous mass at there right parasternal region consistent with lipoma.    Abdominal: Soft. Bowel sounds are normal. She exhibits no distension and no mass. There is no tenderness.  Musculoskeletal: Normal range of motion. She exhibits no edema.  Lymphadenopathy:    She has no cervical adenopathy.  Neurological: She is alert and oriented to person, place, and time. No cranial nerve deficit. She exhibits normal muscle tone. Coordination normal.  Skin: Skin is warm and dry. No rash noted.   Psychiatric: She has a normal mood and affect. Her behavior is normal. Judgment and thought content normal.  Nursing note and vitals reviewed.   ED Course  Procedures (including critical care time) DIAGNOSTIC STUDIES: Oxygen Saturation is 100% on RA,  normal by my interpretation.    COORDINATION OF CARE: 2:30 AM Discussed treatment plan which includes discharge with tramadol with pt at bedside and pt agreed to plan.    MDM   Final diagnoses:  Lipoma of torso    Subcutaneous mass on the right chest wall consistent with lipoma. Old records are reviewed and she has 4 prior ED visits related to this is-starting in December 2016. She was referred to general surgery on one occasion, but she states that she lost the paperwork and never made the appointment. She is advised that she will need to see a surgeon since the lipoma will not go away on its own and it seems to be giving her problems. She insists that her pain is too severe for acetaminophen and ibuprofen. She was given hydrocodone-acetaminophen at her last visit, but I do not feel she should be on narcotics that are not strong. She is discharged with a prescription for tramadol.  I personally performed the services described in this documentation, which was scribed in my presence. The recorded information has been reviewed and is accurate.      Delora Fuel, MD [id number]

## 2015-09-18 NOTE — ED Notes (Signed)
Pt has noticed a small lump in chest for several months and noticed it has shifted toward the middle of her chest. She c/o pain with pulling and lifting. Pt states she had a cough x 3 weeks ago.

## 2015-09-18 NOTE — Discharge Instructions (Signed)
The lump on your chest will not go away on its own. The only thing that will make it go away is to have it surgically removed. Please make an appointment with the surgeon to set that up.  Lipoma A lipoma is a noncancerous (benign) tumor that is made up of fat cells. This is a very common type of soft-tissue growth. Lipomas are usually found under the skin (subcutaneous). They may occur in any tissue of the body that contains fat. Common areas for lipomas to appear include the back, shoulders, buttocks, and thighs. Lipomas grow slowly, and they are usually painless. Most lipomas do not cause problems and do not require treatment. CAUSES The cause of this condition is not known. RISK FACTORS This condition is more likely to develop in:  People who are 65-64 years old.  People who have a family history of lipomas. SYMPTOMS A lipoma usually appears as a small, round bump under the skin. It may feel soft or rubbery, but the firmness can vary. Most lipomas are not painful. However, a lipoma may become painful if it is located in an area where it pushes on nerves. DIAGNOSIS A lipoma can usually be diagnosed with a physical exam. You may also have tests to confirm the diagnosis and to rule out other conditions. Tests may include:  Imaging tests, such as a CT scan or MRI.  Removal of a tissue sample to be looked at under a microscope (biopsy). TREATMENT Treatment is not needed for small lipomas that are not causing problems. If a lipoma continues to get bigger or it causes problems, removal is often the best option. Lipomas can also be removed to improve appearance. Removal of a lipoma is usually done with a surgery in which the fatty cells and the surrounding capsule are removed. Most often, a medicine that numbs the area (local anesthetic) is used for this procedure. HOME CARE INSTRUCTIONS  Keep all follow-up visits as directed by your health care provider. This is important. SEEK MEDICAL CARE  IF:  Your lipoma becomes larger or hard.  Your lipoma becomes painful, red, or increasingly swollen. These could be signs of infection or a more serious condition.   This information is not intended to replace advice given to you by your health care provider. Make sure you discuss any questions you have with your health care provider.   Document Released: 05/13/2002 Document Revised: 10/07/2014 Document Reviewed: 05/19/2014 Elsevier Interactive Patient Education 2016 Elsevier Inc.  Tramadol tablets What is this medicine? TRAMADOL (TRA ma dole) is a pain reliever. It is used to treat moderate to severe pain in adults. This medicine may be used for other purposes; ask your health care provider or pharmacist if you have questions. What should I tell my health care provider before I take this medicine? They need to know if you have any of these conditions: -brain tumor -depression -drug abuse or addiction -head injury -if you frequently drink alcohol containing drinks -kidney disease or trouble passing urine -liver disease -lung disease, asthma, or breathing problems -seizures or epilepsy -suicidal thoughts, plans, or attempt; a previous suicide attempt by you or a family member -an unusual or allergic reaction to tramadol, codeine, other medicines, foods, dyes, or preservatives -pregnant or trying to get pregnant -breast-feeding How should I use this medicine? Take this medicine by mouth with a full glass of water. Follow the directions on the prescription label. If the medicine upsets your stomach, take it with food or milk. Do not take  more medicine than you are told to take. Talk to your pediatrician regarding the use of this medicine in children. Special care may be needed. Overdosage: If you think you have taken too much of this medicine contact a poison control center or emergency room at once. NOTE: This medicine is only for you. Do not share this medicine with others. What if  I miss a dose? If you miss a dose, take it as soon as you can. If it is almost time for your next dose, take only that dose. Do not take double or extra doses. What may interact with this medicine? Do not take this medicine with any of the following medications: -MAOIs like Carbex, Eldepryl, Marplan, Nardil, and Parnate This medicine may also interact with the following medications: -alcohol or medicines that contain alcohol -antihistamines -benzodiazepines -bupropion -carbamazepine or oxcarbazepine -clozapine -cyclobenzaprine -digoxin -furazolidone -linezolid -medicines for depression, anxiety, or psychotic disturbances -medicines for migraine headache like almotriptan, eletriptan, frovatriptan, naratriptan, rizatriptan, sumatriptan, zolmitriptan -medicines for pain like pentazocine, buprenorphine, butorphanol, meperidine, nalbuphine, and propoxyphene -medicines for sleep -muscle relaxants -naltrexone -phenobarbital -phenothiazines like perphenazine, thioridazine, chlorpromazine, mesoridazine, fluphenazine, prochlorperazine, promazine, and trifluoperazine -procarbazine -warfarin This list may not describe all possible interactions. Give your health care provider a list of all the medicines, herbs, non-prescription drugs, or dietary supplements you use. Also tell them if you smoke, drink alcohol, or use illegal drugs. Some items may interact with your medicine. What should I watch for while using this medicine? Tell your doctor or health care professional if your pain does not go away, if it gets worse, or if you have new or a different type of pain. You may develop tolerance to the medicine. Tolerance means that you will need a higher dose of the medicine for pain relief. Tolerance is normal and is expected if you take this medicine for a long time. Do not suddenly stop taking your medicine because you may develop a severe reaction. Your body becomes used to the medicine. This does NOT  mean you are addicted. Addiction is a behavior related to getting and using a drug for a non-medical reason. If you have pain, you have a medical reason to take pain medicine. Your doctor will tell you how much medicine to take. If your doctor wants you to stop the medicine, the dose will be slowly lowered over time to avoid any side effects. You may get drowsy or dizzy. Do not drive, use machinery, or do anything that needs mental alertness until you know how this medicine affects you. Do not stand or sit up quickly, especially if you are an older patient. This reduces the risk of dizzy or fainting spells. Alcohol can increase or decrease the effects of this medicine. Avoid alcoholic drinks. You may have constipation. Try to have a bowel movement at least every 2 to 3 days. If you do not have a bowel movement for 3 days, call your doctor or health care professional. Your mouth may get dry. Chewing sugarless gum or sucking hard candy, and drinking plenty of water may help. Contact your doctor if the problem does not go away or is severe. What side effects may I notice from receiving this medicine? Side effects that you should report to your doctor or health care professional as soon as possible: -allergic reactions like skin rash, itching or hives, swelling of the face, lips, or tongue -breathing difficulties, wheezing -confusion -itching -light headedness or fainting spells -redness, blistering, peeling or loosening of the skin,  including inside the mouth -seizures Side effects that usually do not require medical attention (report to your doctor or health care professional if they continue or are bothersome): -constipation -dizziness -drowsiness -headache -nausea, vomiting This list may not describe all possible side effects. Call your doctor for medical advice about side effects. You may report side effects to FDA at 1-800-FDA-1088. Where should I keep my medicine? Keep out of the reach of  children. This medicine may cause accidental overdose and death if it taken by other adults, children, or pets. Mix any unused medicine with a substance like cat litter or coffee grounds. Then throw the medicine away in a sealed container like a sealed bag or a coffee can with a lid. Do not use the medicine after the expiration date. Store at room temperature between 15 and 30 degrees C (59 and 86 degrees F). NOTE: This sheet is a summary. It may not cover all possible information. If you have questions about this medicine, talk to your doctor, pharmacist, or health care provider.    2016, Elsevier/Gold Standard. (2013-07-19 15:42:09)

## 2015-10-20 ENCOUNTER — Other Ambulatory Visit: Payer: Self-pay | Admitting: General Surgery

## 2015-10-20 DIAGNOSIS — N63 Unspecified lump in unspecified breast: Secondary | ICD-10-CM

## 2015-10-26 ENCOUNTER — Ambulatory Visit
Admission: RE | Admit: 2015-10-26 | Discharge: 2015-10-26 | Disposition: A | Discharge status: Home / Self Care | Payer: No Typology Code available for payment source | Source: Ambulatory Visit | Attending: General Surgery | Admitting: General Surgery

## 2015-10-26 DIAGNOSIS — N63 Unspecified lump in unspecified breast: Secondary | ICD-10-CM

## 2015-11-06 ENCOUNTER — Encounter (HOSPITAL_COMMUNITY): Payer: Self-pay | Admitting: *Deleted

## 2015-11-06 ENCOUNTER — Emergency Department (HOSPITAL_COMMUNITY)
Admission: EM | Admit: 2015-11-06 | Discharge: 2015-11-06 | Disposition: A | Discharge status: Home / Self Care | Payer: No Typology Code available for payment source | Attending: Emergency Medicine | Admitting: Emergency Medicine

## 2015-11-06 ENCOUNTER — Emergency Department (HOSPITAL_COMMUNITY): Payer: No Typology Code available for payment source

## 2015-11-06 DIAGNOSIS — M542 Cervicalgia: Secondary | ICD-10-CM | POA: Insufficient documentation

## 2015-11-06 DIAGNOSIS — Z87891 Personal history of nicotine dependence: Secondary | ICD-10-CM | POA: Insufficient documentation

## 2015-11-06 DIAGNOSIS — R222 Localized swelling, mass and lump, trunk: Secondary | ICD-10-CM

## 2015-11-06 LAB — CBC WITH DIFFERENTIAL/PLATELET
BASOS ABS: 0 10*3/uL (ref 0.0–0.1)
BASOS PCT: 1 %
EOS PCT: 1 %
Eosinophils Absolute: 0 10*3/uL (ref 0.0–0.7)
HCT: 40.1 % (ref 36.0–46.0)
Hemoglobin: 13 g/dL (ref 12.0–15.0)
Lymphocytes Relative: 34 %
Lymphs Abs: 1.4 10*3/uL (ref 0.7–4.0)
MCH: 29.2 pg (ref 26.0–34.0)
MCHC: 32.4 g/dL (ref 30.0–36.0)
MCV: 90.1 fL (ref 78.0–100.0)
MONO ABS: 0.3 10*3/uL (ref 0.1–1.0)
Monocytes Relative: 6 %
NEUTROS ABS: 2.4 10*3/uL (ref 1.7–7.7)
Neutrophils Relative %: 58 %
PLATELETS: 255 10*3/uL (ref 150–400)
RBC: 4.45 MIL/uL (ref 3.87–5.11)
RDW: 12.2 % (ref 11.5–15.5)
WBC: 4.1 10*3/uL (ref 4.0–10.5)

## 2015-11-06 LAB — BASIC METABOLIC PANEL
ANION GAP: 4 — AB (ref 5–15)
BUN: 12 mg/dL (ref 6–20)
CALCIUM: 9.2 mg/dL (ref 8.9–10.3)
CO2: 27 mmol/L (ref 22–32)
Chloride: 107 mmol/L (ref 101–111)
Creatinine, Ser: 1.25 mg/dL — ABNORMAL HIGH (ref 0.44–1.00)
GFR, EST NON AFRICAN AMERICAN: 58 mL/min — AB (ref >60)
Glucose, Bld: 93 mg/dL (ref 65–99)
Potassium: 4.1 mmol/L (ref 3.5–5.1)
Sodium: 138 mmol/L (ref 135–145)

## 2015-11-06 MED ORDER — ACETAMINOPHEN 500 MG PO TABS
1000.0000 mg | ORAL_TABLET | Freq: Once | ORAL | Status: AC
  Administered 2015-11-06: 1000 mg via ORAL
  Filled 2015-11-06: qty 2

## 2015-11-06 MED ORDER — IBUPROFEN 200 MG PO TABS
400.0000 mg | ORAL_TABLET | Freq: Once | ORAL | Status: AC
  Administered 2015-11-06: 400 mg via ORAL
  Filled 2015-11-06: qty 2

## 2015-11-06 NOTE — ED Notes (Signed)
Pt states swollen, painful lymph nodes to anterior portion of her neck and lump to L rib cage x 1 month.  Also c/o headaches, low grade fevers.

## 2015-11-06 NOTE — ED Provider Notes (Signed)
CSN: [id number]     Arrival date & time 11/06/15  1030 History   First MD Initiated Contact with Patient 11/06/15 1059     Chief Complaint  Patient presents with  . Neck Pain   (Consider location/radiation/quality/duration/timing/severity/associated sxs/prior Treatment) HPI 30 y.o. female, presents to the Emergency Department today complaining of swollen lymph nodes in anterior neck x 1 month. Notes that she came in today, "because she had time." Notes that the lymph nodes in her neck occasionally feel heavy. Noted pain intermittently that she rates 10/10. OTC medications with minimal relief. No fevers. No N/V/D. No decrease in PO intake. No neck stiffness/rigitdy. No sore throat. No CP/SOB/ABD pain. No other symptoms noted.    Past Medical History  Diagnosis Date  . Pilonidal cyst   . DDD (degenerative disc disease)   . Arthritis   . Anxiety    Past Surgical History  Procedure Laterality Date  . No past surgeries     Family History  Problem Relation Age of Onset  . Cancer Maternal Grandmother     breast   Social History  Substance Use Topics  . Smoking status: Former Smoker -- 0.50 packs/day    Types: Cigarettes    Quit date: 09/06/2015  . Smokeless tobacco: Never Used  . Alcohol Use: 1.2 oz/week    2 Shots of liquor per week     Comment: weekends   OB History    No data available     Review of Systems ROS reviewed and all are negative for acute change except as noted in the HPI.  Allergies  Review of patient's allergies indicates no known allergies.  Home Medications   Prior to Admission medications   Medication Sig Start Date End Date Taking? Authorizing Provider  acetaminophen (TYLENOL) 500 MG tablet Take 1,000 mg by mouth every 6 (six) hours as needed for mild pain.   Yes Historical Provider, MD  traMADol (ULTRAM) 50 MG tablet Take 1 tablet (50 mg total) by mouth every 6 (six) hours as needed. [id number]   Delora Fuel, MD   BP 0000000 mmHg  Pulse 89  Temp(Src)  98.2 F (36.8 C) (Oral)  Resp 16  Ht 4\' 11"  (1.499 m)  Wt 65.772 kg  BMI 29.27 kg/m2  SpO2 100%  LMP 11/01/2015   Physical Exam  Constitutional: She is oriented to person, place, and time. She appears well-developed and well-nourished.  HENT:  Head: Normocephalic and atraumatic.  Mouth/Throat: Uvula is midline, oropharynx is clear and moist and mucous membranes are normal. No oral lesions. No trismus in the jaw. Normal dentition. No dental abscesses, uvula swelling, lacerations or dental caries. No oropharyngeal exudate, posterior oropharyngeal edema, posterior oropharyngeal erythema or tonsillar abscesses.  Eyes: EOM are normal. Pupils are equal, round, and reactive to light.  Neck: Trachea normal, normal range of motion, full passive range of motion without pain and phonation normal. Neck supple. Normal carotid pulses present. No spinous process tenderness and no muscular tenderness present. Carotid bruit is not present. No rigidity. No tracheal deviation, no edema, no erythema and normal range of motion present. No thyroid mass and no thyromegaly present.  No appreciable lymph node inflammation on exam. Full ROM of neck without pain.   Cardiovascular: Normal rate, regular rhythm, normal heart sounds and intact distal pulses.   No murmur heard. Pulmonary/Chest: Effort normal and breath sounds normal. No respiratory distress. She has no wheezes. She has no rales.  Abdominal: Soft.  Musculoskeletal: Normal range of motion.  Neurological: She is alert and oriented to person, place, and time.  Skin: Skin is warm and dry.  Psychiatric: She has a normal mood and affect. Her behavior is normal. Thought content normal.  Nursing note and vitals reviewed.  ED Course  Procedures (including critical care time) Labs Review Labs Reviewed  BASIC METABOLIC PANEL - Abnormal; Notable for the following:    Creatinine, Ser 1.25 (*)    GFR calc non Af Amer 58 (*)    Anion gap 4 (*)    All other  components within normal limits  CBC WITH DIFFERENTIAL/PLATELET   Imaging Review Dg Chest 2 View  11/06/2015  CLINICAL DATA:  Chest and neck nodule.  Evaluate for mass. EXAM: CHEST  2 VIEW COMPARISON:  None. FINDINGS: The heart size and mediastinal contours are within normal limits. Both lungs are clear. The visualized skeletal structures are unremarkable. A fiducial marker was placed over the area of concern within the ventral mid chest wall at the level of the sternal manubrium. No underlying mass identified. IMPRESSION: 1. Negative exam.  No mass visualized over the area of concern. Electronically Signed   By: Kerby Moors M.D.   On: 11/06/2015 13:17   I have personally reviewed and evaluated these images and lab results as part of my medical decision-making.   EKG Interpretation None      MDM  I have reviewed the relevant previous healthcare records. I obtained HPI from historian. Patient discussed with supervising physician  ED Course:  Assessment: Pt is a 29yF who presents due to swollen bilateral anterior lymph nodes x 1 month. On exam, pt in NAD. Nontoxic/nonseptic appearing. VSS. Afebrile. Lungs CTA. Heart RRR. No appreciable lymph node inflammation on exam. Full ROM without pain. Able to tolerate PO. No posterior oropharyngeal erythema or exudate. Low suspicion for abscess. Discussed with supervising physician who has seen and assessed patient. CBC unremarkable. No Leukocytosis with concern for lymphoma. Plan is to DC home with follow up to PCP. At time of discharge, Patient is in no acute distress. Vital Signs are stable. Patient is able to ambulate. Patient able to tolerate PO.    Disposition/Plan:  DC Home Additional Verbal discharge instructions given and discussed with patient.  Pt Instructed to f/u with PCP in the next week for evaluation and treatment of symptoms. Return precautions given Pt acknowledges and agrees with plan  Supervising Physician Varney Biles,  MD   Final diagnoses:  Nodule of chest wall  Neck pain     Shary Decamp, PA-C 11/06/15 [place], MD 11/06/15 1624

## 2015-11-06 NOTE — ED Notes (Signed)
Patient states she has been having pain in her anterior neck lymph nodes for several weeks.  States that she came in today because she had time.

## 2015-11-06 NOTE — Discharge Instructions (Signed)
Please read and follow all provided instructions.  Your diagnoses today include:  1. Neck pain   2. Nodule of chest wall    Tests performed today include:  Vital signs. See below for your results today.   Medications prescribed:   Take as prescribed   Home care instructions:  Follow any educational materials contained in this packet.  Follow-up instructions: Please follow-up with your primary care provider for further evaluation of symptoms and treatment   Return instructions:   Please return to the Emergency Department if you do not get better, if you get worse, or new symptoms OR  - Fever (temperature greater than 101.25F)  - Bleeding that does not stop with holding pressure to the area    -Severe pain (please note that you may be more sore the day after your accident)  - Chest Pain  - Difficulty breathing  - Severe nausea or vomiting  - Inability to tolerate food and liquids  - Passing out  - Skin becoming red around your wounds  - Change in mental status (confusion or lethargy)  - New numbness or weakness     Please return if you have any other emergent concerns.  Additional Information:  Your vital signs today were: BP 114/67 mmHg   Pulse 71   Temp(Src) 98.2 F (36.8 C) (Oral)   Resp 18   Ht 4\' 11"  (1.499 m)   Wt 65.772 kg   BMI 29.27 kg/m2   SpO2 99%   LMP 11/01/2015 If your blood pressure (BP) was elevated above 135/85 this visit, please have this repeated by your doctor within one month. ---------------

## 2015-11-06 NOTE — ED Notes (Signed)
Discharged vitals done.  Waiting for paperwork.

## 2015-11-09 ENCOUNTER — Telehealth: Payer: Self-pay | Admitting: *Deleted

## 2015-11-24 ENCOUNTER — Emergency Department (HOSPITAL_COMMUNITY)
Admission: EM | Admit: 2015-11-24 | Discharge: 2015-11-25 | Disposition: A | Discharge status: Home / Self Care | Payer: No Typology Code available for payment source | Attending: Emergency Medicine | Admitting: Emergency Medicine

## 2015-11-24 ENCOUNTER — Encounter (HOSPITAL_COMMUNITY): Payer: Self-pay | Admitting: Emergency Medicine

## 2015-11-24 DIAGNOSIS — M1990 Unspecified osteoarthritis, unspecified site: Secondary | ICD-10-CM | POA: Insufficient documentation

## 2015-11-24 DIAGNOSIS — J020 Streptococcal pharyngitis: Secondary | ICD-10-CM

## 2015-11-24 DIAGNOSIS — Z87891 Personal history of nicotine dependence: Secondary | ICD-10-CM | POA: Insufficient documentation

## 2015-11-24 LAB — RAPID STREP SCREEN (MED CTR MEBANE ONLY): STREPTOCOCCUS, GROUP A SCREEN (DIRECT): NEGATIVE

## 2015-11-24 MED ORDER — DEXAMETHASONE SODIUM PHOSPHATE 10 MG/ML IJ SOLN
10.0000 mg | Freq: Once | INTRAMUSCULAR | Status: AC
  Administered 2015-11-24: 10 mg via INTRAMUSCULAR
  Filled 2015-11-24: qty 1

## 2015-11-24 MED ORDER — PENICILLIN G BENZATHINE 1200000 UNIT/2ML IM SUSP
1.2000 10*6.[IU] | Freq: Once | INTRAMUSCULAR | Status: AC
  Administered 2015-11-24: 1.2 10*6.[IU] via INTRAMUSCULAR
  Filled 2015-11-24: qty 2

## 2015-11-24 NOTE — Discharge Instructions (Signed)
[facility] [employer] is a great source of information about community services available.  Access by dialing 2-1-1 from anywhere in [place], or by website -  CustodianSupply.fi.   Other Local Resources (Updated 06/2015)  Dental  Care   Services    Phone Number and Address  Cost  [facility] For children 62 - 28 years of age:   Cleaning  Tooth brushing/flossing instruction  Sealants, fillings, crowns  Extractions  Emergency treatment  [phone] [address], [place] [address] Charges based on family income.  Medicaid and some insurance plans accepted.     [facility] - [facility], fillings, crowns  Extractions  Emergency treatment [phone] [address] [place], [place]  Pregnant women 39 years of age or older with a Medicaid card  [facility] - [place]  Cleaning  Sealants, fillings, crowns  Extractions  Emergency treatment [phone] [address] [place], [place] Pregnant women 73 years of age or older with a Medicaid card  [facility] For children 43 - 33 years of age:   Cleaning  Tooth brushing/flossing instruction  Sealants, fillings, crowns  Extractions  Emergency treatment Limited orthodontic services for patients with Medicaid [phone] [address], [place] [address] Medicaid and [facility] Health Choice cover for children up to age 71 and pregnant women.  Parents of children up to age 47 without Medicaid pay a reduced fee at time of service.  [place] For children 88 - 33 years of age:   Cleaning  Tooth brushing/flossing instruction  Sealants, fillings, crowns  Extractions  Emergency treatment Limited orthodontic services for patients with Medicaid  [phone] [place], [place].  Medicaid and [place] Health Choice cover for children up to age 26 and pregnant women.  Parents of children up to age 42 without Medicaid pay a reduced fee.  [facility]  Sealants, fillings, crowns  Extractions  Hours: Tuesdays and Thursdays, 4:15 - 8 pm [phone] [address] [place], [place] [address] Services free of charge to [facility] residents ages 18-64 who do not have health insurance, Medicare, [place], or [place] benefits and fall within federal poverty guidelines  [place] care in addition to primary medical care, nutritional counseling, and pharmacy:  Engineer, drilling, fillings, crowns  Extractions                  [phone] [facility] [place], [place], [place], [place], [place], [place] [facility], [place], [place] [facility] [place], [place], [place], most insurance.  Also provides services available to all with fees adjusted based on ability to pay.    [facility]  Cleaning  Tooth brushing/flossing instruction  Sealants, fillings, crowns  Extractions  Emergency treatment Hours: Tuesdays, Thursdays, and Fridays from 8 am to 5 pm by appointment only. [phone] [place], [place] [address] [facility] residents with Medicaid (depending on eligibility) and children with [facility] Health Choice - call for more information.  [facility]  Extractions only  Hours: 2nd and 4th Thursday of each month from 6:30 am - 9 am.   [phone] ext. [place], [place] [address] Ages 98 and older only.  Patients are seen on a first come, first served basis.  [employer]  [employer]  Extractions  Orthodontics  Endodontics  Implants/Crowns/Bridges  Complete and partial dentures [phone] [place], [place] Patients must complete an application for services.  There is often a waiting list.     Pharyngitis Pharyngitis is redness, pain, and swelling (inflammation) of your pharynx.  CAUSES  Pharyngitis is usually caused by infection. Most of the time, these infections are from viruses (viral) and are part of a cold. However, sometimes pharyngitis is caused by bacteria (bacterial). Pharyngitis can also be caused by allergies. Viral pharyngitis may be spread from person to person by coughing, sneezing, and personal items or utensils (cups, forks, spoons, toothbrushes). Bacterial pharyngitis may be spread from person to person by more intimate contact, such as kissing.  SIGNS AND SYMPTOMS  Symptoms of pharyngitis include:   Sore throat.   Tiredness (fatigue).   Low-grade fever.   Headache.  Joint pain and muscle aches.  Skin rashes.  Swollen lymph nodes.  Plaque-like film on throat or tonsils (often seen with bacterial pharyngitis). DIAGNOSIS  Your health care provider will ask you questions about your illness and your symptoms. Your medical history, along with a physical exam, is often all that is needed to diagnose pharyngitis. Sometimes, a rapid strep test is done. Other lab tests may also be done, depending on the suspected cause.  TREATMENT  Viral pharyngitis will usually get better in 3-4 days without the use of medicine. Bacterial pharyngitis is treated with medicines that kill germs (antibiotics).  HOME CARE INSTRUCTIONS   Drink enough water and fluids to keep your urine clear or pale yellow.   Only take over-the-counter or prescription medicines as directed by your health care provider:    If you are prescribed antibiotics, make sure you finish them even if you start to feel better.   Do not take aspirin.   Get lots of rest.   Gargle with 8 oz of salt water ( tsp of salt per 1 qt of water) as often as every 1-2 hours to soothe your throat.   Throat lozenges (if you are not at risk for choking) or sprays may be used to soothe your throat. SEEK MEDICAL CARE IF:   You have large, tender lumps in your neck.  You have a rash.  You cough up green, yellow-brown, or bloody spit. SEEK IMMEDIATE MEDICAL CARE IF:   Your neck becomes stiff.  You drool or are unable to swallow liquids.  You vomit or are unable to keep medicines or liquids down.  You have severe pain that does not go away with the use of recommended medicines.  You have trouble breathing (not caused by a stuffy nose). MAKE SURE YOU:   Understand these instructions.  Will watch your condition.  Will get help right away if you are not doing well or get worse.   This information is not intended to replace advice given to you by your health care provider. Make sure you discuss any questions you have with your health care provider.  Encourage adequate hydration, drink plenty of fluids. Follow up with your primary care provider if your symptoms do not improve. Return to the ED if you experience severe  worsening of your symptoms, difficulty breathing or swallowing, increased fevers, rash.

## 2015-11-24 NOTE — ED Provider Notes (Signed)
CSN: [id number]     Arrival date & time 11/24/15  2241 History  By signing my name below, I, [name], attest that this documentation has been prepared under the direction and in the presence of Samantha Dowless PA-C.  Electronically Signed: [name], ED Scribe. 11/01/2015. 4:01 PM.  Chief Complaint  Patient presents with  . Sore Throat   The history is provided by the patient. No language interpreter was used.   HPI Comments: Natalie Thornton is a 30 y.o. female who presents to the Emergency Department complaining of constant sore throat that started last night. Pt states pain is exacerbated while swallowing and also states that it feels like her lymph nodes are swollen. Pt also complains of a bump on the back right side of her mouth that is also painful when swallowing. Pt reports mild headache and rhinorrhea. Pt denies fever, cough, ear pain. Pt also denies any known sick contact but states she has been around children recently.   Past Medical History  Diagnosis Date  . Pilonidal cyst   . DDD (degenerative disc disease)   . Arthritis   . Anxiety    Past Surgical History  Procedure Laterality Date  . No past surgeries     Family History  Problem Relation Age of Onset  . Cancer Maternal Grandmother     breast   Social History  Substance Use Topics  . Smoking status: Former Smoker -- 0.50 packs/day    Types: Cigarettes    Quit date: 09/06/2015  . Smokeless tobacco: Never Used  . Alcohol Use: Yes     Comment: occasionally   OB History    No data available     Review of Systems  Constitutional: Negative for fever.  HENT: Positive for sore throat. Negative for rhinorrhea.   Respiratory: Negative for cough.   All other systems reviewed and are negative.     Allergies  Review of patient's allergies indicates no known allergies.  Home Medications   Prior to Admission medications   Medication Sig Start Date End Date Taking? Authorizing Provider  acetaminophen  (TYLENOL) 500 MG tablet Take 1,000 mg by mouth every 6 (six) hours as needed for mild pain.    Historical Provider, MD  traMADol (ULTRAM) 50 MG tablet Take 1 tablet (50 mg total) by mouth every 6 (six) hours as needed. Patient not taking: Reported on 11/24/2015 10/06/75   Delora Fuel, MD   BP 412/87 mmHg  Pulse 73  Temp(Src) 99.2 F (37.3 C) (Oral)  Resp 18  SpO2 100%  LMP 11/01/2015 Physical Exam  Constitutional: She is oriented to person, place, and time. She appears well-developed and well-nourished. No distress.  HENT:  Head: Normocephalic and atraumatic.  Mouth/Throat: Uvula is midline and mucous membranes are normal. No trismus in the jaw. Abnormal dentition. Dental caries present. No dental abscesses. Oropharyngeal exudate, posterior oropharyngeal edema and posterior oropharyngeal erythema present. No tonsillar abscesses.  Eyes: Conjunctivae are normal. Right eye exhibits no discharge. Left eye exhibits no discharge. No scleral icterus.  Cardiovascular: Normal rate.   Pulmonary/Chest: Effort normal.  Neurological: She is alert and oriented to person, place, and time. Coordination normal.  Skin: Skin is warm and dry. No rash noted. She is not diaphoretic. No erythema. No pallor.  Psychiatric: She has a normal mood and affect. Her behavior is normal.  Nursing note and vitals reviewed.   ED Course  Procedures  DIAGNOSTIC STUDIES: Oxygen Saturation is 100% on RA, normal by my interpretation.  COORDINATION OF CARE: 11:40 PM-Will order Decadron. Discussed treatment plan with pt at bedside and pt agreed to plan.    Labs Review Labs Reviewed  RAPID STREP SCREEN (NOT AT [facility])  CULTURE, GROUP A STREP [facility])    Imaging Review No results found. I have personally reviewed and evaluated these images and lab results as part of my medical decision-making.   EKG Interpretation None      MDM   Final diagnoses:  Strep pharyngitis    Pt with tonsillar exudate, cervical  lymphadenopathy, & dysphagia; diagnosis of strep. Temp is 99.2. 3/4 centor criteria met.Treated in the Ed with steroids and PCN IM.  Pt appears mildly dehydrated, discussed importance of water rehydration. Presentation non concerning for PTA or infxn spread to soft tissue. No trismus or uvula deviation. Specific return precautions discussed. Pt able to drink water in ED without difficulty with intact air way. Recommended PCP follow up.    I personally performed the services described in this documentation, which was scribed in my presence. The recorded information has been reviewed and is accurate.    Dondra Spry Rison, PA-C 11/25/15 0023  Merrily Pew, MD 11/25/15 1200

## 2015-11-24 NOTE — ED Notes (Signed)
Pt states last night her throat began to hurt.  She also stated she has a "bump" in the back of her throat which makes it hard for her to swallow and and close her mouth.  Upon assessment, redness and white patches noted on the back of the throat.

## 2015-11-27 LAB — CULTURE, GROUP A STREP (THRC)

## 2015-12-02 ENCOUNTER — Ambulatory Visit: Payer: No Typology Code available for payment source | Admitting: Family Medicine

## 2015-12-21 ENCOUNTER — Emergency Department (HOSPITAL_COMMUNITY)
Admission: EM | Admit: 2015-12-21 | Discharge: 2015-12-22 | Disposition: A | Discharge status: Home / Self Care | Payer: Self-pay | Attending: Emergency Medicine | Admitting: Emergency Medicine

## 2015-12-21 ENCOUNTER — Encounter (HOSPITAL_COMMUNITY): Payer: Self-pay | Admitting: Emergency Medicine

## 2015-12-21 ENCOUNTER — Emergency Department (HOSPITAL_COMMUNITY): Payer: Self-pay

## 2015-12-21 DIAGNOSIS — Y999 Unspecified external cause status: Secondary | ICD-10-CM | POA: Insufficient documentation

## 2015-12-21 DIAGNOSIS — K5909 Other constipation: Secondary | ICD-10-CM

## 2015-12-21 DIAGNOSIS — Y929 Unspecified place or not applicable: Secondary | ICD-10-CM | POA: Insufficient documentation

## 2015-12-21 DIAGNOSIS — S63501A Unspecified sprain of right wrist, initial encounter: Secondary | ICD-10-CM | POA: Insufficient documentation

## 2015-12-21 DIAGNOSIS — W010XXA Fall on same level from slipping, tripping and stumbling without subsequent striking against object, initial encounter: Secondary | ICD-10-CM | POA: Insufficient documentation

## 2015-12-21 DIAGNOSIS — G8929 Other chronic pain: Secondary | ICD-10-CM | POA: Insufficient documentation

## 2015-12-21 DIAGNOSIS — Y9389 Activity, other specified: Secondary | ICD-10-CM | POA: Insufficient documentation

## 2015-12-21 DIAGNOSIS — K5900 Constipation, unspecified: Secondary | ICD-10-CM | POA: Insufficient documentation

## 2015-12-21 DIAGNOSIS — Z87891 Personal history of nicotine dependence: Secondary | ICD-10-CM | POA: Insufficient documentation

## 2015-12-21 MED ORDER — NAPROXEN 250 MG PO TABS
500.0000 mg | ORAL_TABLET | Freq: Once | ORAL | Status: AC
  Administered 2015-12-22: 500 mg via ORAL
  Filled 2015-12-21: qty 2

## 2015-12-21 MED ORDER — POLYETHYLENE GLYCOL 3350 17 G PO PACK: 17.0000 g | PACK | Freq: Every day | ORAL | Status: DC

## 2015-12-21 MED ORDER — NAPROXEN 500 MG PO TABS: 500.0000 mg | ORAL_TABLET | Freq: Two times a day (BID) | ORAL | Status: DC | PRN

## 2015-12-21 NOTE — Discharge Instructions (Signed)
Wear wrist brace for at least 2 weeks for stabilization of wrist. Ice and elevate wrist throughout the day, using ice pack for no more than 20 minutes every hour.  Alternate between naprosyn and tylenol for pain relief.  Call hand specialist follow up today or tomorrow to schedule followup appointment for recheck of ongoing wrist pain in 1-2 weeks. For your chronic constipation, take miralax daily, increase fiber and water intake to help with this issue, and follow up with your regular doctor in 1 week for ongoing management of your chronic constipation. Return to the ER for changes or worsening symptoms.    Wrist Sprain With Rehab A sprain is an injury in which a ligament that maintains the proper alignment of a joint is partially or completely torn. The ligaments of the wrist are susceptible to sprains. Sprains are classified into three categories. Grade 1 sprains cause pain, but the tendon is not lengthened. Grade 2 sprains include a lengthened ligament because the ligament is stretched or partially ruptured. With grade 2 sprains there is still function, although the function may be diminished. Grade 3 sprains are characterized by a complete tear of the tendon or muscle, and function is usually impaired. SYMPTOMS   Pain tenderness, inflammation, and/or bruising (contusion) of the injury.  A "pop" or tear felt and/or heard at the time of injury.  Decreased wrist function. CAUSES  A wrist sprain occurs when a force is placed on one or more ligaments that is greater than it/they can withstand. Common mechanisms of injury include:  Catching a ball with your hands.  Repetitive and/ or strenuous extension or flexion of the wrist. RISK INCREASES WITH:  Previous wrist injury.  Contact sports (boxing or wrestling).  Activities in which falling is common.  Poor strength and flexibility.  Improperly fitted or padded protective equipment. PREVENTION  Warm up and stretch properly before  activity.  Allow for adequate recovery between workouts.  Maintain physical fitness:  Strength, flexibility, and endurance.  Cardiovascular fitness.  Protect the wrist joint by limiting its motion with the use of taping, braces, or splints.  Protect the wrist after injury for 6 to 12 months. PROGNOSIS  The prognosis for wrist sprains depends on the degree of injury. Grade 1 sprains require 2 to 6 weeks of treatment. Grade 2 sprains require 6 to 8 weeks of treatment, and grade 3 sprains require up to 12 weeks.  RELATED COMPLICATIONS   Prolonged healing time, if improperly treated or re-injured.  Recurrent symptoms that result in a chronic problem.  Injury to nearby structures (bone, cartilage, nerves, or tendons).  Arthritis of the wrist.  Inability to compete in athletics at a high level.  Wrist stiffness or weakness.  Progression to a complete rupture of the ligament. TREATMENT  Treatment initially involves resting from any activities that aggravate the symptoms, and the use of ice and medications to help reduce pain and inflammation. Your caregiver may recommend immobilizing the wrist for a period of time in order to reduce stress on the ligament and allow for healing. After immobilization it is important to perform strengthening and stretching exercises to help regain strength and a full range of motion. These exercises may be completed at home or with a therapist. Surgery is not usually required for wrist sprains, unless the ligament has been ruptured (grade 3 sprain). MEDICATION   If pain medication is necessary, then nonsteroidal anti-inflammatory medications, such as aspirin and ibuprofen, or other minor pain relievers, such as acetaminophen, are often  recommended.  Do not take pain medication for 7 days before surgery.  Prescription pain relievers may be given if deemed necessary by your caregiver. Use only as directed and only as much as you need. HEAT AND COLD  Cold  treatment (icing) relieves pain and reduces inflammation. Cold treatment should be applied for 10 to 15 minutes every 2 to 3 hours for inflammation and pain and immediately after any activity that aggravates your symptoms. Use ice packs or massage the area with a piece of ice (ice massage).  Heat treatment may be used prior to performing the stretching and strengthening activities prescribed by your caregiver, physical therapist, or athletic trainer. Use a heat pack or soak your injury in warm water. SEEK MEDICAL CARE IF:  Treatment seems to offer no benefit, or the condition worsens.  Any medications produce adverse side effects. EXERCISES RANGE OF MOTION (ROM) AND STRETCHING EXERCISES - Wrist Sprain  These exercises may help you when beginning to rehabilitate your injury. Your symptoms may resolve with or without further involvement from your physician, physical therapist or athletic trainer. While completing these exercises, remember:   Restoring tissue flexibility helps normal motion to return to the joints. This allows healthier, less painful movement and activity.  An effective stretch should be held for at least 30 seconds.  A stretch should never be painful. You should only feel a gentle lengthening or release in the stretched tissue. RANGE OF MOTION - Wrist Flexion, Active-Assisted  Extend your right / left elbow with your fingers pointing down.*  Gently pull the back of your hand towards you until you feel a gentle stretch on the top of your forearm.  Hold this position for __________ seconds. Repeat __________ times. Complete this exercise __________ times per day.  *If directed by your physician, physical therapist or athletic trainer, complete this stretch with your elbow bent rather than extended. RANGE OF MOTION - Wrist Extension, Active-Assisted  Extend your right / left elbow and turn your palm upwards.*  Gently pull your palm/fingertips back so your wrist extends and  your fingers point more toward the ground.  You should feel a gentle stretch on the inside of your forearm.  Hold this position for __________ seconds. Repeat __________ times. Complete this exercise __________ times per day. *If directed by your physician, physical therapist or athletic trainer, complete this stretch with your elbow bent, rather than extended. RANGE OF MOTION - Supination, Active  Stand or sit with your elbows at your side. Bend your right / left elbow to 90 degrees.  Turn your palm upward until you feel a gentle stretch on the inside of your forearm.  Hold this position for __________ seconds. Slowly release and return to the starting position. Repeat __________ times. Complete this stretch __________ times per day.  RANGE OF MOTION - Pronation, Active  Stand or sit with your elbows at your side. Bend your right / left elbow to 90 degrees.  Turn your palm downward until you feel a gentle stretch on the top of your forearm.  Hold this position for __________ seconds. Slowly release and return to the starting position. Repeat __________ times. Complete this stretch __________ times per day.  STRETCH - Wrist Flexion  Place the back of your right / left hand on a tabletop leaving your elbow slightly bent. Your fingers should point away from your body.  Gently press the back of your hand down onto the table by straightening your elbow. You should feel a stretch on  the top of your forearm.  Hold this position for __________ seconds. Repeat __________ times. Complete this stretch __________ times per day.  STRETCH - Wrist Extension  Place your right / left fingertips on a tabletop leaving your elbow slightly bent. Your fingers should point backwards.  Gently press your fingers and palm down onto the table by straightening your elbow. You should feel a stretch on the inside of your forearm.  Hold this position for __________ seconds. Repeat __________ times. Complete  this stretch __________ times per day.  STRENGTHENING EXERCISES - Wrist Sprain These exercises may help you when beginning to rehabilitate your injury. They may resolve your symptoms with or without further involvement from your physician, physical therapist or athletic trainer. While completing these exercises, remember:   Muscles can gain both the endurance and the strength needed for everyday activities through controlled exercises.  Complete these exercises as instructed by your physician, physical therapist or athletic trainer. Progress with the resistance and repetition exercises only as your caregiver advises. STRENGTH - Wrist Flexors  Sit with your right / left forearm palm-up and fully supported. Your elbow should be resting below the height of your shoulder. Allow your wrist to extend over the edge of the surface.  Loosely holding a __________ weight or a piece of rubber exercise band/tubing, slowly curl your hand up toward your forearm.  Hold this position for __________ seconds. Slowly lower the wrist back to the starting position in a controlled manner. Repeat __________ times. Complete this exercise __________ times per day.  STRENGTH - Wrist Extensors  Sit with your right / left forearm palm-down and fully supported. Your elbow should be resting below the height of your shoulder. Allow your wrist to extend over the edge of the surface.  Loosely holding a __________ weight or a piece of rubber exercise band/tubing, slowly curl your hand up toward your forearm.  Hold this position for __________ seconds. Slowly lower the wrist back to the starting position in a controlled manner. Repeat __________ times. Complete this exercise __________ times per day.  STRENGTH - Ulnar Deviators  Stand with a ____________________ weight in your right / left hand, or sit holding on to the rubber exercise band/tubing with your opposite arm supported.  Move your wrist so that your pinkie travels  toward your forearm and your thumb moves away from your forearm.  Hold this position for __________ seconds and then slowly lower the wrist back to the starting position. Repeat __________ times. Complete this exercise __________ times per day STRENGTH - Radial Deviators  Stand with a ____________________ weight in your  right / left hand, or sit holding on to the rubber exercise band/tubing with your arm supported.  Raise your hand upward in front of you or pull up on the rubber tubing.  Hold this position for __________ seconds and then slowly lower the wrist back to the starting position. Repeat __________ times. Complete this exercise __________ times per day. STRENGTH - Forearm Supinators  Sit with your right / left forearm supported on a table, keeping your elbow below shoulder height. Rest your hand over the edge, palm down.  Gently grip a hammer or a soup ladle.  Without moving your elbow, slowly turn your palm and hand upward to a "thumbs-up" position.  Hold this position for __________ seconds. Slowly return to the starting position. Repeat __________ times. Complete this exercise __________ times per day.  STRENGTH - Forearm Pronators  Sit with your right / left forearm supported  on a table, keeping your elbow below shoulder height. Rest your hand over the edge, palm up.  Gently grip a hammer or a soup ladle.  Without moving your elbow, slowly turn your palm and hand upward to a "thumbs-up" position.  Hold this position for __________ seconds. Slowly return to the starting position. Repeat __________ times. Complete this exercise __________ times per day.  STRENGTH - Grip  Grasp a tennis ball, a dense sponge, or a large, rolled sock in your hand.  Squeeze as hard as you can without increasing any pain.  Hold this position for __________ seconds. Release your grip slowly. Repeat __________ times. Complete this exercise __________ times per day.    This information  is not intended to replace advice given to you by your health care provider. Make sure you discuss any questions you have with your health care provider.   Document Released: 05/23/2005 Document Revised: 02/11/2015 Document Reviewed: 09/04/2008 Elsevier Interactive Patient Education 2016 [employer] Injury  Sesamoid bones are bones that are completely enclosed by a tendon. The most recognizable sesamoid bone is the kneecap (patella). Your body also has sesamoid bones in the hands and feet. Sesamoid bones of the feet are more commonly injured than those of the hand. Sesamoid bones in the feet may be injured because of the force placed on them while standing, walking, running, or jumping. Sesamoid injuries include:   Inflammation of the sesamoid (sesamoiditis).  Fracture.  Stress fracture. The sesamoid bone on the base of the big toe is especially susceptible. SYMPTOMS   Pain with weight bearing on the foot, such as with standing, walking, running, jumping, or dancing.  Pain with trying to lift the big toe.  Tenderness and swelling under the base of the big toe. CAUSES  A sesamoid injury is typically caused by acute trauma or overuse trauma to the foot. This may include jumping and landing on the ball of the foot or jumping or dancing on the balls of the feet. Other causes include:  Interrupted blood supply (avascular necrosis).  Infection. RISK INCREASES WITH:  Sports that require jumping from a great height or repeated jumping or standing on the balls of the feet. These include:  Basketball.  Ballet.  Jogging.  Long-distance running.  Shoes that are too small or have very high heels.  Large or poorly shaped sesamoid bone.  Bunions. PREVENTION  Warm up and stretch properly before activity.  Maintain appropriate conditioning:  Ankle and leg flexibility.  Muscle strength and endurance.  Learn and use proper technique and have a coach correct improper  technique.  Wear taping, protective strapping, bracing, or padding.  Wear shoes that are the proper size and ensure correct fit. PROGNOSIS If detected early and treated properly, sesamoid injuries are usually curable within 4 to 6 months.  RELATED COMPLICATIONS   Prolonged healing time if not appropriately treated or if not given enough time to heal.  Fracture does not heal (nonunion).  Prolonged disability.  Frequent recurrence of symptoms. Appropriately addressing the problem with rehabilitation decreases frequency of recurrence and optimizes healing time.  Arthritis of the joint between the sesamoid and the rest of the big toe.  Complications of surgery, including infection, bleeding, injury to nerves, continued pain, bunion or reverse bunion formation, toe weakness, and toe hyperextension. TREATMENT Treatment initially involves the use of ice and medication to reduce pain and inflammation. It may be recommended for you to modify your activities, so they do not cause an increase  in the severity of symptoms. Depending on the severity of the injury, you may be required to use crutches in order to keep weight off of the injury. Padding, bracing, or taping the area may help reduce pain. Casting of the leg and foot, a walking boot, or a stiff-soled shoe (with or without an arch support) may also be helpful. For cases of chronic sesamoid symptoms, the use of physical therapy may be recommended. On occasion, corticosteroid injections are given to reduce inflammation. It is uncommon, but possible, for surgery to be necessary to remove the sesamoid bone. MEDICATION   If pain medication is necessary, nonsteroidal anti-inflammatory medications such as aspirin and ibuprofen or other minor pain relievers such as acetaminophen are often recommended.  Do not take pain medication for 7 days before surgery.  Prescription pain relievers are usually only prescribed after surgery. Use only as directed and  only as much as you need.  Corticosteroid injections may be given to reduce inflammation. However, these injections may only be given a certain number of times. HEAT AND COLD Cold treatment (icing) relieves pain and reduces inflammation. Cold treatment should be applied for 10 to 15 minutes every 2 to 3 hours for inflammation and pain and immediately after any activity that aggravates your symptoms. Use ice packs or massage the area with a piece of ice (ice massage). SEEK MEDICAL CARE IF:   Symptoms get worse or do not improve in 6 weeks despite treatment.  Any signs of infection develop, including fever, headaches, muscular aches and weakness, fatigue, redness, warmth, or increased swelling or pain.  Any of the following occur after surgery:  You experience pain, numbness, or coldness in the foot and ankle.  Blue, gray, or dark color appears in the toenails.  Signs of infection develop, including fever, increased pain, swelling, redness, drainage, or bleeding in the surgical area.  New, unexplained symptoms develop (drugs used in treatment may produce side effects including bleeding, stomach upset, and allergic reactions).   This information is not intended to replace advice given to you by your health care provider. Make sure you discuss any questions you have with your health care provider.   Document Released: 05/23/2005 Document Revised: 08/15/2011 Document Reviewed: 12/03/2014 Elsevier Interactive Patient Education 2016 [employer]  Cryotherapy Cryotherapy is when you put ice on your injury. Ice helps lessen pain and puffiness (swelling) after an injury. Ice works the best when you start using it in the first 24 to 48 hours after an injury. HOME CARE  Put a dry or damp towel between the ice pack and your skin.  You may press gently on the ice pack.  Leave the ice on for no more than 10 to 20 minutes at a time.  Check your skin after 5 minutes to make sure your skin is  okay.  Rest at least 20 minutes between ice pack uses.  Stop using ice when your skin loses feeling (numbness).  Do not use ice on someone who cannot tell you when it hurts. This includes small children and people with memory problems (dementia). GET HELP RIGHT AWAY IF:  You have white spots on your skin.  Your skin turns blue or pale.  Your skin feels waxy or hard.  Your puffiness gets worse. MAKE SURE YOU:   Understand these instructions.  Will watch your condition.  Will get help right away if you are not doing well or get worse.   This information is not intended to replace advice given  to you by your health care provider. Make sure you discuss any questions you have with your health care provider.   Document Released: 11/09/2007 Document Revised: 08/15/2011 Document Reviewed: 01/13/2011 Elsevier Interactive Patient Education 2016 [employer].  Constipation, Adult Constipation is when a person:  Poops (has a bowel movement) less than 3 times a week.  Has a hard time pooping.  Has poop that is dry, hard, or bigger than normal. HOME CARE   Eat foods with a lot of fiber in them. This includes fruits, vegetables, beans, and whole grains such as brown rice.  Avoid fatty foods and foods with a lot of sugar. This includes french fries, hamburgers, cookies, candy, and soda.  If you are not getting enough fiber from food, take products with added fiber in them (supplements).  Drink enough fluid to keep your pee (urine) clear or pale yellow.  Exercise on a regular basis, or as told by your doctor.  Go to the restroom when you feel like you need to poop. Do not hold it.  Only take medicine as told by your doctor. Do not take medicines that help you poop (laxatives) without talking to your doctor first. GET HELP RIGHT AWAY IF:   You have bright red blood in your poop (stool).  Your constipation lasts more than 4 days or gets worse.  You have belly (abdominal) or  butt (rectal) pain.  You have thin poop (as thin as a pencil).  You lose weight, and it cannot be explained. MAKE SURE YOU:   Understand these instructions.  Will watch your condition.  Will get help right away if you are not doing well or get worse.   This information is not intended to replace advice given to you by your health care provider. Make sure you discuss any questions you have with your health care provider.   Document Released: 11/09/2007 Document Revised: 06/13/2014 Document Reviewed: 03/04/2013 Elsevier Interactive Patient Education 2016 Else[employer] Inc.  High-Fiber Diet Fiber, also called dietary fiber, is a type of carbohydrate found in fruits, vegetables, whole grains, and beans. A high-fiber diet can have many health benefits. Your health care provider may recommend a high-fiber diet to help:  Prevent constipation. Fiber can make your bowel movements more regular.  Lower your cholesterol.  Relieve hemorrhoids, uncomplicated diverticulosis, or irritable bowel syndrome.  Prevent overeating as part of a weight-loss plan.  Prevent heart disease, type 2 diabetes, and certain cancers. WHAT IS MY PLAN? The recommended daily intake of fiber includes:  38 grams for men under age 41.  62 grams for men over age 64.  35 grams for women under age 69.  10 grams for women over age 35. You can get the recommended daily intake of dietary fiber by eating a variety of fruits, vegetables, grains, and beans. Your health care provider may also recommend a fiber supplement if it is not possible to get enough fiber through your diet. WHAT DO I NEED TO KNOW ABOUT A HIGH-FIBER DIET?  Fiber supplements have not been widely studied for their effectiveness, so it is better to get fiber through food sources.  Always check the fiber content on thenutrition facts label of any prepackaged food. Look for foods that contain at least 5 grams of fiber per serving.  Ask your dietitian if  you have questions about specific foods that are related to your condition, especially if those foods are not listed in the following section.  Increase your daily fiber consumption gradually. Increasing  your intake of dietary fiber too quickly may cause bloating, cramping, or gas.  Drink plenty of water. Water helps you to digest fiber. WHAT FOODS CAN I EAT? Grains Whole-grain breads. Multigrain cereal. Oats and oatmeal. Brown rice. Barley. Bulgur wheat. Peetz. Bran muffins. Popcorn. Rye wafer crackers. Vegetables Sweet potatoes. Spinach. Kale. Artichokes. Cabbage. Broccoli. Green peas. Carrots. Squash. Fruits Berries. Pears. Apples. Oranges. Avocados. Prunes and raisins. Dried figs. Meats and Other Protein Sources Navy, kidney, pinto, and soy beans. Split peas. Lentils. Nuts and seeds. Dairy Fiber-fortified yogurt. Beverages Fiber-fortified soy milk. Fiber-fortified orange juice. Other Fiber bars. The items listed above may not be a complete list of recommended foods or beverages. Contact your dietitian for more options. WHAT FOODS ARE NOT RECOMMENDED? Grains White bread. Pasta made with refined flour. White rice. Vegetables Fried potatoes. Canned vegetables. Well-cooked vegetables.  Fruits Fruit juice. Cooked, strained fruit. Meats and Other Protein Sources Fatty cuts of meat. Fried Sales executive or fried fish. Dairy Milk. Yogurt. Cream cheese. Sour cream. Beverages Soft drinks. Other Cakes and pastries. Butter and oils. The items listed above may not be a complete list of foods and beverages to avoid. Contact your dietitian for more information. WHAT ARE SOME TIPS FOR INCLUDING HIGH-FIBER FOODS IN MY DIET?  Eat a wide variety of high-fiber foods.  Make sure that half of all grains consumed each day are whole grains.  Replace breads and cereals made from refined flour or white flour with whole-grain breads and cereals.  Replace white rice with brown rice, bulgur wheat, or  millet.  Start the day with a breakfast that is high in fiber, such as a cereal that contains at least 5 grams of fiber per serving.  Use beans in place of meat in soups, salads, or pasta.  Eat high-fiber snacks, such as berries, raw vegetables, nuts, or popcorn.   This information is not intended to replace advice given to you by your health care provider. Make sure you discuss any questions you have with your health care provider.   Document Released: 05/23/2005 Document Revised: 06/13/2014 Document Reviewed: 11/05/2013 Elsevier Interactive Patient Education [employer].

## 2015-12-21 NOTE — ED Provider Notes (Signed)
CSN: [id number]     Arrival date & time 12/21/15  2233 History  By signing my name below, I, Eustaquio Maize, attest that this documentation has been prepared under the direction and in the presence of [address] [employer]. Electronically Signed: Eustaquio Maize, ED Scribe. 12/21/2015. 11:34 PM.   Chief Complaint  Patient presents with  . Wrist Pain   Patient is a 30 y.o. female presenting with wrist pain. The history is provided by the patient. No language interpreter was used.  Wrist Pain This is a new problem. The current episode started more than 1 week ago. The problem occurs constantly. The problem has not changed since onset.Pertinent negatives include no chest pain, no abdominal pain and no shortness of breath. Exacerbated by: movement of wrist. Nothing relieves the symptoms. Treatments tried: tylenol, motrin, ice, compression. The treatment provided no relief.    HPI Comments: Natalie Thornton is a 30 y.o. female who presents to the Emergency Department complaining of gradual onset, constant, 10/10, sharp, non-radiating, right wrist pain s/p ground level fall that occurred 1.5 weeks ago. Pt reports that she tripped and fell and attempting to catch herself on her outstretched hands, injuring her right wrist in the process. No head injury or LOC. The pain is exacerbated with movement of the wrist. Pt mentions having swelling that has since improved. She also complains of an intermittent tingling sensation to the right hand/wrist. Pt has taken Ibuprofen and Tylenol and applied ice and an ace wrap without relief. Pt also mentions having chronic constipation, which is not a new issue, and states that her last bowel movement was ~1 week ago. Pt has had issues with constipation in the past and has taken rectal suppositories before but has not taken it recently-- states this is a chronic issue without acute changes. Denies bruising, redness, warmth, numbness, weakness, loss of ROM of wrist, fever,  chills, chest pain, shortness of breath, abdominal pain, nausea, vomiting, diarrhea, dysuria, hematuria, or any other associated symptoms.   Past Medical History  Diagnosis Date  . Pilonidal cyst   . DDD (degenerative disc disease)   . Arthritis   . Anxiety    Past Surgical History  Procedure Laterality Date  . No past surgeries     Family History  Problem Relation Age of Onset  . Cancer Maternal Grandmother     breast   Social History  Substance Use Topics  . Smoking status: Former Smoker -- 0.00 packs/day    Types: Cigarettes    Quit date: 09/06/2015  . Smokeless tobacco: Never Used  . Alcohol Use: Yes     Comment: occasionally   OB History    No data available     Review of Systems  Constitutional: Negative for fever and chills.  HENT: Negative for facial swelling (no head injury).   Respiratory: Negative for shortness of breath.   Cardiovascular: Negative for chest pain.  Gastrointestinal: Positive for constipation (chronic, unchanged). Negative for nausea, vomiting, abdominal pain and diarrhea.  Genitourinary: Negative for dysuria and hematuria.  Musculoskeletal: Positive for joint swelling and arthralgias.  Skin: Negative for color change and wound.  Allergic/Immunologic: Negative for immunocompromised state.  Neurological: Negative for syncope, weakness and numbness.  Psychiatric/Behavioral: Negative for confusion.  A complete 10 system review of systems was obtained and all systems are negative except as noted in the HPI and PMH.   Allergies  Review of patient's allergies indicates no known allergies.  Home Medications   Prior to Admission medications  Medication Sig Start Date End Date Taking? Authorizing Provider  acetaminophen (TYLENOL) 500 MG tablet Take 1,000 mg by mouth every 6 (six) hours as needed for mild pain.    Historical Provider, MD  traMADol (ULTRAM) 50 MG tablet Take 1 tablet (50 mg total) by mouth every 6 (six) hours as needed. Patient  not taking: Reported on 11/24/2015 99991111   Delora Fuel, MD   BP XX123456 mmHg  Pulse 81  Temp(Src) 98.4 F (36.9 C) (Oral)  Resp 16  Ht 4\' 11"  (1.499 m)  Wt 153 lb (69.4 kg)  BMI 30.89 kg/m2  SpO2 100%  LMP 12/01/2015 (Approximate)   Physical Exam  Constitutional: She is oriented to person, place, and time. Vital signs are normal. She appears well-developed and well-nourished.  Non-toxic appearance. No distress.  Afebrile, nontoxic, NAD  HENT:  Head: Normocephalic and atraumatic.  Mouth/Throat: Mucous membranes are normal.  Eyes: Conjunctivae and EOM are normal. Right eye exhibits no discharge. Left eye exhibits no discharge.  Neck: Normal range of motion. Neck supple.  Cardiovascular: Normal rate and intact distal pulses.   Pulmonary/Chest: Effort normal. No respiratory distress.  Abdominal: Normal appearance. She exhibits no distension.  Musculoskeletal: Normal range of motion.       Right wrist: She exhibits tenderness and bony tenderness. She exhibits normal range of motion, no swelling, no crepitus and no deformity.  Right wrist with FROM intact, with moderate TTP in the anatomical snuff box and diffusely across the wrist, no crepitus or deformity, no swelling, skin intact with no bruising or erythema, no warmth, strength and sensation grossly intact, distal pulses intact, negative Tinel's sign.   Neurological: She is alert and oriented to person, place, and time. She has normal strength. No sensory deficit.  Skin: Skin is warm, dry and intact. No rash noted.  Psychiatric: She has a normal mood and affect. Her behavior is normal.  Nursing note and vitals reviewed.   ED Course  Procedures (including critical care time)  DIAGNOSTIC STUDIES: Oxygen Saturation is 100% on RA, normal by my interpretation.    COORDINATION OF CARE: 11:29 PM-Discussed treatment plan which includes Rx NSAIDs and thumb spica with pt at bedside and pt agreed to plan.   Labs Review Labs Reviewed -  No data to display  Imaging Review Dg Wrist Complete Right  12/21/2015  CLINICAL DATA:  Right wrist pain after injury. Fall 10 days prior, pain since that time. EXAM: RIGHT WRIST - COMPLETE 3+ VIEW COMPARISON:  None. FINDINGS: There is no evidence of fracture or dislocation. Scaphoid is intact. There is no evidence of arthropathy or other focal bone abnormality. Soft tissues are unremarkable. IMPRESSION: Negative radiographs of the right wrist. Electronically Signed   By: Jeb Levering M.D.   On: 12/21/2015 23:12   I have personally reviewed and evaluated these images and lab results as part of my medical decision-making.   EKG Interpretation None      MDM   Final diagnoses:  Wrist sprain, right, initial encounter  Chronic constipation    30 y.o. female here with R wrist pain x1.5 wks s/p [place] injury. FROM intact, no swelling or deformity, +scaphoid tenderness, NVI with soft compartments. Xray neg but given scaphoid tenderness, will tx conservatively with thumb spica velcro splint. Discussed naprosyn/tylenol for pain, RICE discussed, hand f/up in 1-2wks for recheck of symptoms. Pt with chronic constipation, no acute concerns today, but discussed miralax daily and increase fiber/water intake. F/up with PCP in 1wk for ongoing management of  this issue, doubt need for emergent work up today for this; will avoid narcotics since she has constipation issues. I explained the diagnosis and have given explicit precautions to return to the ER including for any other new or worsening symptoms. The patient understands and accepts the medical plan as it's been dictated and I have answered their questions. Discharge instructions concerning home care and prescriptions have been given. The patient is STABLE and is discharged to home in good condition.   I personally performed the services described in this documentation, which was scribed in my presence. The recorded information has been reviewed and is  accurate.  BP 118/77 mmHg  Pulse 81  Temp(Src) 98.4 F (36.9 C) (Oral)  Resp 16  Ht 4\' 11"  (1.499 m)  Wt 69.4 kg  BMI 30.89 kg/m2  SpO2 100%  LMP 12/01/2015 (Approximate)  Meds ordered this encounter  Medications  . polyethylene glycol (MIRALAX / GLYCOLAX) packet    Sig: Take 17 g by mouth daily.    Dispense:  30 each    Refill:  0    Order Specific Question:  Supervising Provider    Answer:  Sabra Heck, BRIAN [[id number]]  . naproxen (NAPROSYN) 500 MG tablet    Sig: Take 1 tablet (500 mg total) by mouth 2 (two) times daily as needed for mild pain, moderate pain or headache (TAKE WITH MEALS.).    Dispense:  20 tablet    Refill:  0    Order Specific Question:  Supervising Provider    Answer:  MILLER, BRIAN [[id number]]  . naproxen (NAPROSYN) tablet 500 mg    SigZacarias Pontes, PA-C 12/21/15 [place], MD 12/22/15 0030

## 2015-12-21 NOTE — ED Notes (Signed)
Pt. reports pain at right wrist injured 1 1/2 weeks ago when she tripped and fell , presents with mild swelling / pain with movement .

## 2015-12-22 NOTE — ED Notes (Signed)
Ortho notified of need for wrist/thumb spica splint for patient.

## 2016-01-15 ENCOUNTER — Encounter (HOSPITAL_COMMUNITY): Payer: Self-pay

## 2016-01-15 DIAGNOSIS — R222 Localized swelling, mass and lump, trunk: Secondary | ICD-10-CM | POA: Insufficient documentation

## 2016-01-15 DIAGNOSIS — M542 Cervicalgia: Secondary | ICD-10-CM | POA: Insufficient documentation

## 2016-01-15 DIAGNOSIS — Z87891 Personal history of nicotine dependence: Secondary | ICD-10-CM | POA: Insufficient documentation

## 2016-01-15 NOTE — ED Triage Notes (Signed)
Pt complaining of lump on R breast and R back. Pt complaining of discomfort while lying. Denies any chest pain or SOB. Small mass noted to R back. No obvious deformity to R chest.

## 2016-01-16 ENCOUNTER — Emergency Department (HOSPITAL_COMMUNITY)
Admission: EM | Admit: 2016-01-16 | Discharge: 2016-01-16 | Disposition: A | Discharge status: Home / Self Care | Payer: No Typology Code available for payment source | Attending: Emergency Medicine | Admitting: Emergency Medicine

## 2016-01-16 DIAGNOSIS — R222 Localized swelling, mass and lump, trunk: Secondary | ICD-10-CM

## 2016-01-16 MED ORDER — CYCLOBENZAPRINE HCL 10 MG PO TABS
5.0000 mg | ORAL_TABLET | Freq: Once | ORAL | Status: AC
  Administered 2016-01-16: 5 mg via ORAL
  Filled 2016-01-16: qty 1

## 2016-01-16 MED ORDER — CYCLOBENZAPRINE HCL 5 MG PO TABS: 5.0000 mg | ORAL_TABLET | Freq: Three times a day (TID) | ORAL | 0 refills | Status: DC | PRN

## 2016-01-16 MED ORDER — IBUPROFEN 400 MG PO TABS
400.0000 mg | ORAL_TABLET | Freq: Once | ORAL | Status: AC
  Administered 2016-01-16: 400 mg via ORAL
  Filled 2016-01-16: qty 1

## 2016-01-16 MED ORDER — CYCLOBENZAPRINE HCL 5 MG PO TABS: 10.0000 mg | ORAL_TABLET | Freq: Three times a day (TID) | ORAL | 0 refills | Status: DC | PRN

## 2016-01-16 NOTE — Discharge Instructions (Signed)
Read the information below.   The mass on your chest could be a cyst, lipoma, or lymph node. It is important for you to follow up with a primary care provider regarding the mass on your anterior chest. I have provided the contact information for [facility] and [facility]. Please call to schedule a follow up appointment.  I have provided the contact information for headache wellness center.  Please call to schedule an appointment for further evaluation of your headaches.  For the back pain. I have prescribed a muscle relaxer. Take as directed for relief of pain. Muscle relaxers can make you drowsy, do not drive after taking.  You can take tylenol 650mg  every 6hrs or motrin 400mg  every 6hrs for pain relief.  Use the prescribed medication as directed.  Please discuss all new medications with your pharmacist.   You may return to the Emergency Department at any time for worsening condition or any new symptoms that concern you. Return to ED if your symptoms worsen or you develop fever, unilateral weakness/numbness, warmth/swelling/redness/drainage from mass.

## 2016-01-16 NOTE — ED Provider Notes (Signed)
[facility] DEPT Provider Note   CSN: [id number] Arrival date & time: 01/15/16  2221  First Provider Contact:  First MD Initiated Contact with Patient 01/16/16 0308        History   Chief Complaint Chief Complaint  Patient presents with  . Breast Mass    HPI Natalie Thornton is a 30 y.o. female.  Natalie Thornton is a 30 y.o. female with history of anxiety, DDD, and arthritis presents to ED with multiple complaints. Patient reports the lump on chest wall has been present for 4-5 months. She was seen in the ED around the time the lump first appeared and told it was a lipoma. She has not followed up with a PCP since. She returns today for continued presence of the lump. She states the lump has hardened and that she has chest pain under the site of the lump and shortness of breath when she lays down. Symptoms resolve when she sits up. She denies any warmth, redness, or drainage from lump. No dizziness or lightheadedness.   Patient also reports back pain and the presents of a new lump on her right back. She noticed the new lump last night. She denies redness, warmth, or drainage from the site. It is mildly tender. She denies any trauma to her back, loss of bowel or bladder function, saddle anesthesia, numbness or weakness, IVDU, h/o cancer, fever, unexpected weight loss, dysuria, or hematuria.   Patient also endorses frequent headaches. She currently does not have a headache. Patient states she has a headache approximately 3 times a week. They are gradual in onset, left sided, and throbbing. She can have associated photophobia and nausea with the headache. Denies any pattern of when headache starts. They improve following BC powder. Denies any numbness, weakness, changes in vision.       Past Medical History:  Diagnosis Date  . Anxiety   . Arthritis   . DDD (degenerative disc disease)   . Pilonidal cyst     Patient Active Problem List   Diagnosis Date Noted  . Pilonidal sinus  07/05/2011    Past Surgical History:  Procedure Laterality Date  . NO PAST SURGERIES      OB History    No data available       Home Medications    Prior to Admission medications   Medication Sig Start Date End Date Taking? Authorizing Provider  cyclobenzaprine (FLEXERIL) 5 MG tablet Take 1 tablet (5 mg total) by mouth 3 (three) times daily as needed for muscle spasms. 01/16/16   Roxanna Mew, PA-C  naproxen (NAPROSYN) 500 MG tablet Take 1 tablet (500 mg total) by mouth 2 (two) times daily as needed for mild pain, moderate pain or headache (TAKE WITH MEALS.). Patient not taking: Reported on 01/16/2016 12/21/15   Mercedes Camprubi-Soms, PA-C  polyethylene glycol (MIRALAX / GLYCOLAX) packet Take 17 g by mouth daily. Patient not taking: Reported on 01/16/2016 12/21/15   Mercedes Camprubi-Soms, PA-C  traMADol (ULTRAM) 50 MG tablet Take 1 tablet (50 mg total) by mouth every 6 (six) hours as needed. Patient not taking: Reported on 11/24/2015 99991111   Delora Fuel, MD    Family History Family History  Problem Relation Age of Onset  . Cancer Maternal Grandmother     breast    Social History Social History  Substance Use Topics  . Smoking status: Former Smoker    Packs/day: 0.00    Types: Cigarettes    Quit date: 09/06/2015  . Smokeless  tobacco: Never Used  . Alcohol use Yes     Comment: occasionally     Allergies   Review of patient's allergies indicates no known allergies.   Review of Systems Review of Systems  Constitutional: Negative for fever and unexpected weight change.  HENT: Negative for sore throat.   Eyes: Negative for visual disturbance.  Respiratory: Positive for shortness of breath ( intermittent, when laying down).   Cardiovascular: Positive for chest pain ( intermittent, when laying down at site of mass). Negative for leg swelling.  Gastrointestinal: Negative for abdominal pain, constipation, diarrhea, nausea and vomiting.  Genitourinary: Negative for  dysuria and hematuria.  Musculoskeletal: Positive for back pain.  Skin:       Mass on chest and mass on back  Neurological: Positive for headaches (not currently). Negative for dizziness, weakness, light-headedness and numbness.     Physical Exam Updated Vital Signs BP 122/74   Pulse 67   Temp 98.8 F (37.1 C) (Oral)   Resp 16   Ht 4\' 11"  (1.499 m)   Wt 69.7 kg   LMP 12/01/2015 (Approximate)   SpO2 100%   BMI 31.02 kg/m   Physical Exam  Constitutional: She appears well-developed and well-nourished. No distress.  HENT:  Head: Normocephalic and atraumatic.  Mouth/Throat: Oropharynx is clear and moist. No oropharyngeal exudate.  Eyes: Conjunctivae and EOM are normal. Pupils are equal, round, and reactive to light. Right eye exhibits no discharge. Left eye exhibits no discharge. No scleral icterus.  Neck: Normal range of motion and phonation normal. Neck supple. Muscular tenderness ( TTP of right trapezius) present. No spinous process tenderness present. No neck rigidity. Normal range of motion present.  Cardiovascular: Normal rate, regular rhythm, normal heart sounds and intact distal pulses.   No murmur heard. Pulmonary/Chest: Effort normal and breath sounds normal. No stridor. No respiratory distress.    Abdominal: Soft. Bowel sounds are normal. She exhibits no distension. There is no tenderness. There is no rigidity, no rebound, no guarding and no CVA tenderness.  Musculoskeletal: Normal range of motion.  No obvious deformity of spine. No TTP of C-, T-, L- spine. No step off. Mild TTP of right trapezius and right lumbar paravertebral muscles.   Lymphadenopathy:    She has no cervical adenopathy.  Neurological: She is alert. She is not disoriented. Coordination normal. GCS eye subscore is 4. GCS verbal subscore is 5. GCS motor subscore is 6.  Mental Status:  Alert, thought content appropriate, able to give a coherent history. Speech fluent without evidence of aphasia. Able to  follow 2 step commands without difficulty.  Cranial Nerves:  II:  Peripheral visual fields grossly normal, pupils equal, round, reactive to light III,IV, VI: ptosis not present, extra-ocular motions intact bilaterally  V,VII: smile symmetric, facial light touch sensation equal VIII: hearing grossly normal to voice  X: uvula elevates symmetrically  XI: bilateral shoulder shrug symmetric and strong XII: midline tongue extension without fassiculations Motor:  Normal tone. 5/5 in upper and lower extremities bilaterally including strong and equal grip strength and dorsiflexion/plantar flexion Sensory: light touch normal in all extremities. Cerebellar: normal finger-to-nose with bilateral upper extremities CV: distal pulses palpable throughout    Skin: Skin is warm and dry. She is not diaphoretic.     Psychiatric: She has a normal mood and affect. Her behavior is normal.     ED Treatments / Results  Labs (all labs ordered are listed, but only abnormal results are displayed) Labs Reviewed - No data to  display  EKG  EKG Interpretation None       Radiology No results found.  Procedures Procedures (including critical care time)  EMERGENCY DEPARTMENT US SOFT TISSUE INTERPRETATION "Study: Limited Ultrasound of the noted body part in comments below"  INDICATIONS: Pain and Other (lump on chest wall) Multiple views of the body part are obtained with a multi-frequency linear probe  PERFORMED BY:  Myself  IMAGES ARCHIVED?: Yes  SIDE:Right   BODY PART:Chest Wall  FINDINGS: Other smooth, round, hypoechoic region without doppler flow  LIMITATIONS:  Emergent Procedure  INTERPRETATION:  No abcess noted and No cellulitis noted  COMMENT:  Smooth, round, hypoechoic region without evidence of abscess or cellulitis. No doppler  Medications Ordered in ED Medications  ibuprofen (ADVIL,MOTRIN) tablet 400 mg (400 mg Oral Given 01/16/16 0417)  cyclobenzaprine (FLEXERIL) tablet 5 mg (5  mg Oral Given 01/16/16 0417)     Initial Impression / Assessment and Plan / ED Course  I have reviewed the triage vital signs and the nursing notes.  Pertinent labs & imaging results that were available during my care of the patient were reviewed by me and considered in my medical decision making (see chart for details).  Clinical Course   Patient presents to ED with multiple complaints. She is afebrile and non-toxic appearing. Laying back in the exam bed. Respirations are unlabored, O2 sats normal. Vital signs stable. Physical exam remarkable for small, firm, round, mobile 1cm mass on anterior chest wall, right of sternum. Mild TTP. No overlying skin changes noted. Review of records indicate patient has been seen twice in ED regarding lump on chest, most recently 08/2015 with associated sxs of CP and SOB - work up at that time was negative; US showed hypoechoic region without doppler flow. Given context of symptom onset and correlation with lump, do not think labs warranted at this time.  Bedside US performed today showed hypoechoic region without doppler flow. No overlying skin changes - low suspicion for abscess. Suspect lump may be ?lipoma vs. ?cyst.   Regarding mass on back - no area of fluctuance, punctate, drainage, or overlying skin changes. Low suspicion for abscess or cellulitis. Suspect lipoma. Regarding back pain -  No neurological deficits and normal neuro exam. No loss of bowel or bladder control.  No concern for cauda equina. No fever, night sweats, weight loss, h/o cancer, IVDU.  Suspect MSK given TTP of right trapezius and right lumbar paravertebral muscles. RICE protocol and pain medicine indicated and discussed with patient. Regarding headache - patient currently without headache. Normal neurologic exam. No fever, neck pain/rigidity, no changes in vision. Non concerning for [facility], ICH, Meningitis, or temporal arteritis.   Tylenol and muscle relaxer given in ED. Discussed results and  plan with patient. Discussed symptomatic management to include tylenol or motrin for pain control. Rx muscle relaxer for back pain. Encouraged patient to follow up with a primary care provider for further evaluation, provided resources for [facility] and [facility] as well as financial assist resources. Provided contact information for headache wellness center - encouraged patient to call and schedule an appointment for further evaluation and discussion of possible need for headache prophylaxis. Return precautions discussed. Patient voiced understanding and is agreeable.     Final Clinical Impressions(s) / ED Diagnoses   Final diagnoses:  Mass of right chest wall  Mass on back    New Prescriptions Discharge Medication List as of 01/16/2016  4:10 AM    START taking these medications  Details  cyclobenzaprine (FLEXERIL) 5 MG tablet Take 2 tablets (10 mg total) by mouth 3 (three) times daily as needed for muscle spasms., Starting Sat 01/16/2016, Print         [place], [place] 01/17/16 [place], MD 01/17/16 1718

## 2016-01-16 NOTE — ED Notes (Signed)
EDP at bedside  

## 2016-01-25 ENCOUNTER — Other Ambulatory Visit (HOSPITAL_COMMUNITY): Payer: Self-pay | Admitting: General Surgery

## 2016-01-25 DIAGNOSIS — R0781 Pleurodynia: Secondary | ICD-10-CM

## 2016-01-29 ENCOUNTER — Ambulatory Visit (HOSPITAL_COMMUNITY)
Admission: RE | Admit: 2016-01-29 | Discharge: 2016-01-29 | Disposition: A | Discharge status: Home / Self Care | Payer: No Typology Code available for payment source | Source: Ambulatory Visit | Attending: General Surgery | Admitting: General Surgery

## 2016-01-29 DIAGNOSIS — R0781 Pleurodynia: Secondary | ICD-10-CM | POA: Insufficient documentation

## 2016-01-29 DIAGNOSIS — K769 Liver disease, unspecified: Secondary | ICD-10-CM | POA: Insufficient documentation

## 2016-01-29 MED ORDER — IOPAMIDOL (ISOVUE-300) INJECTION 61%
75.0000 mL | Freq: Once | INTRAVENOUS | Status: AC | PRN
  Administered 2016-01-29: 75 mL via INTRAVENOUS

## 2016-02-12 ENCOUNTER — Encounter: Payer: Self-pay | Admitting: Cardiothoracic Surgery

## 2016-02-12 ENCOUNTER — Institutional Professional Consult (permissible substitution) (INDEPENDENT_AMBULATORY_CARE_PROVIDER_SITE_OTHER): Payer: Self-pay | Admitting: Cardiothoracic Surgery

## 2016-02-12 VITALS — BP 127/81 | HR 72 | Resp 20 | Ht 59.0 in | Wt 153.0 lb

## 2016-02-12 DIAGNOSIS — M899 Disorder of bone, unspecified: Secondary | ICD-10-CM

## 2016-02-12 NOTE — Progress Notes (Signed)
PCP is No PCP Per Patient Referring Provider is Jovita Kussmaul, MD  Chief Complaint  Patient presents with  . Lung Lesion    Surgical eval, Chest CT 01/29/16    HPI: Patient presents for evaluation of a painful and tender area on her upper right parasternal chest wall. This area has been the cause for at least 2 emergency room visits. She was evaluated by general surgery as well who did not feel the patient had a lipoma or mass. An ultrasound was performed which was negative. The patient was subtotally referred to this office. Prior to her presentation a CT scan of the chest was performed which I have personally reviewed and counseled with the patient. Chest CT was negative for pulmonary nodules or chest wall mass or mediastinal adenopathy or pulmonary parenchymal disease. Cardiac and thoracic aortic images also were normal.  The patient has recent stop smoking. She has multiple somatic complaints  Past Medical History:  Diagnosis Date  . Anxiety   . Arthritis   . DDD (degenerative disc disease)   . Pilonidal cyst     Past Surgical History:  Procedure Laterality Date  . NO PAST SURGERIES      Family History  Problem Relation Age of Onset  . Cancer Maternal Grandmother     breast    Social History Social History  Substance Use Topics  . Smoking status: Former Smoker    Packs/day: 0.00    Types: Cigarettes    Quit date: 09/06/2015  . Smokeless tobacco: Never Used  . Alcohol use Yes     Comment: occasionally    Current Outpatient Prescriptions  Medication Sig Dispense Refill  . cyclobenzaprine (FLEXERIL) 5 MG tablet Take 1 tablet (5 mg total) by mouth 3 (three) times daily as needed for muscle spasms. (Patient not taking: Reported on 02/12/2016) 15 tablet 0  . naproxen (NAPROSYN) 500 MG tablet Take 1 tablet (500 mg total) by mouth 2 (two) times daily as needed for mild pain, moderate pain or headache (TAKE WITH MEALS.). (Patient not taking: Reported on 01/16/2016) 20 tablet 0   . polyethylene glycol (MIRALAX / GLYCOLAX) packet Take 17 g by mouth daily. (Patient not taking: Reported on 01/16/2016) 30 each 0  . traMADol (ULTRAM) 50 MG tablet Take 1 tablet (50 mg total) by mouth every 6 (six) hours as needed. (Patient not taking: Reported on 11/24/2015) 15 tablet 0   No current facility-administered medications for this visit.     No Known Allergies  Review of Systems  No sustained chest pain No syncope No history of childhood illnesses such as rheumatic fever or scarlet fever No history of melena or hematochezia No history of thoracic trauma or pneumothorax Positive for cough productive of slight sputum, clear   BP 127/81 (BP Location: Left Arm, Patient Position: Sitting, Cuff Size: Normal)   Pulse 72   Resp 20   Ht 4\' 11"  (1.499 m)   Wt 153 lb (69.4 kg)   LMP 01/01/2016   BMI 30.90 kg/m  Physical Exam     Physical Exam  General: Short obese anxious young AA female no acute distress HEENT: Normocephalic pupils equal , dentition adequate Neck: Supple without JVD, adenopathy, or bruit Chest: Clear to auscultation, symmetrical breath sounds, no rhonchi,. The costochondral junction at the right second interspace at the manubrium is not enlarged or erythematous but it is tender. There is no anatomic abnormality or deformity. The same costochondral junction on the left side palpates the same but  without tenderness.              Cardiovascular: Regular rate and rhythm, no murmur, no gallop, peripheral pulses             palpable in all extremities Abdomen:  Soft, nontender, no palpable mass or organomegaly Extremities: Warm, well-perfused, no clubbing cyanosis edema or tenderness,              no venous stasis changes of the legs Rectal/GU: Deferred Neuro: Grossly non--focal and symmetrical throughout Skin: Clean and dry without rash or ulceration   Diagnostic Tests: CT scan personally reviewed and counseled patient No chest wall mass  noted  Impression: Right costochondral pain-costochondritis Plan:Voltaren2% gel prescribed for local application when necessary. Patient reassured that this is not malignant, this is not a tumor, this is not a significant structural chest wall problem. She can also apply heat or cold as needed. No further follow-up needed   Len Childs, MD [facility] [phone]

## 2016-03-10 ENCOUNTER — Encounter (HOSPITAL_COMMUNITY): Payer: Self-pay | Admitting: *Deleted

## 2016-03-10 ENCOUNTER — Emergency Department (HOSPITAL_COMMUNITY)
Admission: EM | Admit: 2016-03-10 | Discharge: 2016-03-10 | Disposition: A | Discharge status: Home / Self Care | Payer: Self-pay | Attending: Emergency Medicine | Admitting: Emergency Medicine

## 2016-03-10 DIAGNOSIS — R0989 Other specified symptoms and signs involving the circulatory and respiratory systems: Secondary | ICD-10-CM

## 2016-03-10 DIAGNOSIS — Z87891 Personal history of nicotine dependence: Secondary | ICD-10-CM | POA: Insufficient documentation

## 2016-03-10 DIAGNOSIS — R590 Localized enlarged lymph nodes: Secondary | ICD-10-CM | POA: Insufficient documentation

## 2016-03-10 DIAGNOSIS — R591 Generalized enlarged lymph nodes: Secondary | ICD-10-CM

## 2016-03-10 MED ORDER — ACETAMINOPHEN 325 MG PO TABS
650.0000 mg | ORAL_TABLET | Freq: Once | ORAL | Status: DC
  Filled 2016-03-10: qty 2

## 2016-03-10 NOTE — ED Notes (Signed)
Pt did not return for discharge instructions

## 2016-03-10 NOTE — ED Notes (Signed)
Pt walked to waiting room to give visitor keys. Waiting for pt to return for discharge

## 2016-03-10 NOTE — ED Provider Notes (Signed)
[place] DEPT Provider Note   CSN: [id number] Arrival date & time: 03/10/16  0108     History   Chief Complaint Chief Complaint  Patient presents with  . Neck Pain    HPI THANDI MAPHIS is a 30 y.o. female.  The history is provided by the patient. No language interpreter was used.  Neck Pain      Goldye ELVERTA SHALA is a 30 y.o. female who presents to the Emergency Department complaining of neck swelling.  She reports one month of a swelling in her left neck that is now going down. She reports associated nausea and intermittent vomiting (last episode two days ago).  She has three months of intermittent abdominal pain.  No cough or SOB.  Sxs are mild and improving.    Past Medical History:  Diagnosis Date  . Anxiety   . Arthritis   . DDD (degenerative disc disease)   . Pilonidal cyst     Patient Active Problem List   Diagnosis Date Noted  . Pilonidal sinus 07/05/2011    Past Surgical History:  Procedure Laterality Date  . NO PAST SURGERIES      OB History    No data available       Home Medications    Prior to Admission medications   Medication Sig Start Date End Date Taking? Authorizing Provider  cyclobenzaprine (FLEXERIL) 5 MG tablet Take 1 tablet (5 mg total) by mouth 3 (three) times daily as needed for muscle spasms. Patient not taking: Reported on 02/12/2016 01/16/16   Roxanna Mew, PA-C  naproxen (NAPROSYN) 500 MG tablet Take 1 tablet (500 mg total) by mouth 2 (two) times daily as needed for mild pain, moderate pain or headache (TAKE WITH MEALS.). Patient not taking: Reported on 01/16/2016 12/21/15   Mercedes Camprubi-Soms, PA-C  polyethylene glycol (MIRALAX / GLYCOLAX) packet Take 17 g by mouth daily. Patient not taking: Reported on 01/16/2016 12/21/15   Mercedes Camprubi-Soms, PA-C  traMADol (ULTRAM) 50 MG tablet Take 1 tablet (50 mg total) by mouth every 6 (six) hours as needed. Patient not taking: Reported on 11/24/2015 99991111   Delora Fuel, MD     Family History Family History  Problem Relation Age of Onset  . Cancer Maternal Grandmother     breast    Social History Social History  Substance Use Topics  . Smoking status: Former Smoker    Packs/day: 0.00    Types: Cigarettes    Quit date: 09/06/2015  . Smokeless tobacco: Never Used  . Alcohol use Yes     Comment: occasionally     Allergies   Review of patient's allergies indicates no known allergies.   Review of Systems Review of Systems  Musculoskeletal: Positive for neck pain.  All other systems reviewed and are negative.    Physical Exam Updated Vital Signs BP 128/84 (BP Location: Right Arm)   Pulse 99   Temp 98.4 F (36.9 C) (Oral)   Resp 18   Ht 4\' 11"  (1.499 m)   Wt 156 lb (70.8 kg)   SpO2 98%   BMI 31.51 kg/m   Physical Exam  Constitutional: She is oriented to person, place, and time. She appears well-developed and well-nourished.  HENT:  Head: Normocephalic and atraumatic.  Neck: Neck supple. No thyromegaly present.  Cardiovascular: Normal rate and regular rhythm.   No murmur heard. Pulmonary/Chest: Effort normal and breath sounds normal. No stridor. No respiratory distress.  Abdominal: Soft. There is no tenderness. There is no  rebound and no guarding.  Musculoskeletal: She exhibits no edema or tenderness.  Lymphadenopathy:    She has no cervical adenopathy.  Neurological: She is alert and oriented to person, place, and time.  Skin: Skin is warm and dry.  Psychiatric: She has a normal mood and affect. Her behavior is normal.  Nursing note and vitals reviewed.    ED Treatments / Results  Labs (all labs ordered are listed, but only abnormal results are displayed) Labs Reviewed - No data to display  EKG  EKG Interpretation None       Radiology No results found.  Procedures Procedures (including critical care time)  Medications Ordered in ED Medications  acetaminophen (TYLENOL) tablet 650 mg (650 mg Oral Not Given  03/10/16 0307)     Initial Impression / Assessment and Plan / ED Course  I have reviewed the triage vital signs and the nursing notes.  Pertinent labs & imaging results that were available during my care of the patient were reviewed by me and considered in my medical decision making (see chart for details).  Clinical Course    Patient here for evaluation of swelling in her left neck. She is nontoxic appearing on examination with no pathologic lymphadenopathy. Offered patient reassurance with outpatient follow-up and return precautions.  Final Clinical Impressions(s) / ED Diagnoses   Final diagnoses:  Tender lymph node    New Prescriptions Discharge Medication List as of 03/10/2016  2:49 AM       Quintella Reichert, MD 03/10/16 [phone]

## 2016-03-10 NOTE — ED Triage Notes (Signed)
Pt states she hasn't been feeling well and has a lump on her neck. No visible lump noted. Pt states lump started to decrease around a month ago.

## 2016-04-10 ENCOUNTER — Encounter (HOSPITAL_COMMUNITY): Payer: Self-pay | Admitting: *Deleted

## 2016-04-10 ENCOUNTER — Emergency Department (HOSPITAL_COMMUNITY)
Admission: EM | Admit: 2016-04-10 | Discharge: 2016-04-10 | Disposition: A | Discharge status: Home / Self Care | Payer: Self-pay | Attending: Emergency Medicine | Admitting: Emergency Medicine

## 2016-04-10 DIAGNOSIS — R1032 Left lower quadrant pain: Secondary | ICD-10-CM | POA: Insufficient documentation

## 2016-04-10 DIAGNOSIS — Z87891 Personal history of nicotine dependence: Secondary | ICD-10-CM | POA: Insufficient documentation

## 2016-04-10 DIAGNOSIS — M542 Cervicalgia: Secondary | ICD-10-CM | POA: Insufficient documentation

## 2016-04-10 DIAGNOSIS — Z79899 Other long term (current) drug therapy: Secondary | ICD-10-CM | POA: Insufficient documentation

## 2016-04-10 LAB — CBC
HCT: 38.2 % (ref 36.0–46.0)
HEMOGLOBIN: 12.8 g/dL (ref 12.0–15.0)
MCH: 30 pg (ref 26.0–34.0)
MCHC: 33.5 g/dL (ref 30.0–36.0)
MCV: 89.7 fL (ref 78.0–100.0)
PLATELETS: 307 10*3/uL (ref 150–400)
RBC: 4.26 MIL/uL (ref 3.87–5.11)
RDW: 12.3 % (ref 11.5–15.5)
WBC: 5.7 10*3/uL (ref 4.0–10.5)

## 2016-04-10 LAB — COMPREHENSIVE METABOLIC PANEL
ALK PHOS: 41 U/L (ref 38–126)
ALT: 12 U/L — AB (ref 14–54)
ANION GAP: 7 (ref 5–15)
AST: 18 U/L (ref 15–41)
Albumin: 3.5 g/dL (ref 3.5–5.0)
BILIRUBIN TOTAL: 0.3 mg/dL (ref 0.3–1.2)
BUN: 7 mg/dL (ref 6–20)
CALCIUM: 9 mg/dL (ref 8.9–10.3)
CO2: 25 mmol/L (ref 22–32)
CREATININE: 0.78 mg/dL (ref 0.44–1.00)
Chloride: 106 mmol/L (ref 101–111)
Glucose, Bld: 87 mg/dL (ref 65–99)
Potassium: 3.7 mmol/L (ref 3.5–5.1)
SODIUM: 138 mmol/L (ref 135–145)
TOTAL PROTEIN: 6.5 g/dL (ref 6.5–8.1)

## 2016-04-10 LAB — URINALYSIS, ROUTINE W REFLEX MICROSCOPIC
BILIRUBIN URINE: NEGATIVE
GLUCOSE, UA: NEGATIVE mg/dL
HGB URINE DIPSTICK: NEGATIVE
Ketones, ur: NEGATIVE mg/dL
Nitrite: NEGATIVE
Protein, ur: NEGATIVE mg/dL
SPECIFIC GRAVITY, URINE: 1.014 (ref 1.005–1.030)
pH: 7.5 (ref 5.0–8.0)

## 2016-04-10 LAB — WET PREP, GENITAL
Clue Cells Wet Prep HPF POC: NONE SEEN
SPERM: NONE SEEN
Trich, Wet Prep: NONE SEEN
YEAST WET PREP: NONE SEEN

## 2016-04-10 LAB — URINE MICROSCOPIC-ADD ON: RBC / HPF: NONE SEEN RBC/hpf (ref 0–5)

## 2016-04-10 LAB — I-STAT BETA HCG BLOOD, ED (MC, WL, AP ONLY): I-stat hCG, quantitative: <5 m[IU]/mL (ref <5)

## 2016-04-10 NOTE — ED Triage Notes (Signed)
Pt reports lower abd pain for extended time, denies urinary or vaginal symptoms. Also has neck stiffness, pain when turning her head. Denies injury to neck. Ambulatory at triage.

## 2016-04-10 NOTE — ED Notes (Signed)
ED Provider at bedside. 

## 2016-04-10 NOTE — ED Notes (Signed)
ED Provider at bedside to discuss disposition 

## 2016-04-10 NOTE — ED Provider Notes (Signed)
[place] DEPT Provider Note   CSN: [id number] Arrival date & time: 04/10/16  1415  History   Chief Complaint Chief Complaint  Patient presents with  . Abdominal Pain  . Neck Pain    HPI Natalie Thornton is a 30 y.o. female.   Abdominal Pain   This is a chronic problem. Episode onset: 3 months ago. The problem occurs constantly. The problem has not changed since onset.The pain is at a severity of 5/10. The pain is mild. Associated symptoms include diarrhea, nausea and vomiting. Pertinent negatives include anorexia, fever, frequency and hematuria.  Neck Pain      Past Medical History:  Diagnosis Date  . Anxiety   . Arthritis   . DDD (degenerative disc disease)   . Pilonidal cyst     Patient Active Problem List   Diagnosis Date Noted  . Pilonidal sinus 07/05/2011    Past Surgical History:  Procedure Laterality Date  . NO PAST SURGERIES      OB History    No data available     Home Medications    Prior to Admission medications   Medication Sig Start Date End Date Taking? Authorizing Provider  cyclobenzaprine (FLEXERIL) 5 MG tablet Take 1 tablet (5 mg total) by mouth 3 (three) times daily as needed for muscle spasms. Patient not taking: Reported on 02/12/2016 01/16/16   Roxanna Mew, PA-C  naproxen (NAPROSYN) 500 MG tablet Take 1 tablet (500 mg total) by mouth 2 (two) times daily as needed for mild pain, moderate pain or headache (TAKE WITH MEALS.). Patient not taking: Reported on 01/16/2016 12/21/15   Mercedes Camprubi-Soms, PA-C  polyethylene glycol (MIRALAX / GLYCOLAX) packet Take 17 g by mouth daily. Patient not taking: Reported on 01/16/2016 12/21/15   Mercedes Camprubi-Soms, PA-C  traMADol (ULTRAM) 50 MG tablet Take 1 tablet (50 mg total) by mouth every 6 (six) hours as needed. Patient not taking: Reported on 11/24/2015 99991111   Delora Fuel, MD    Family History Family History  Problem Relation Age of Onset  . Cancer Maternal Grandmother    breast    Social History Social History  Substance Use Topics  . Smoking status: Former Smoker    Packs/day: 0.00    Types: Cigarettes    Quit date: 09/06/2015  . Smokeless tobacco: Never Used  . Alcohol use Yes     Comment: occasionally     Allergies   Patient has no known allergies.   Review of Systems Review of Systems  Constitutional: Negative for fever.  Gastrointestinal: Positive for abdominal pain, diarrhea, nausea and vomiting. Negative for anorexia.  Genitourinary: Negative for frequency and hematuria.  Musculoskeletal: Positive for neck pain.  All other systems reviewed and are negative.    Physical Exam Updated Vital Signs BP 128/81 (BP Location: Left Arm)   Pulse 86   Temp 98.2 F (36.8 C) (Oral)   Resp 18   LMP 03/30/2016   SpO2 100%   Physical Exam  Constitutional: She is oriented to person, place, and time. She appears well-developed and well-nourished. No distress.  HENT:  Head: Normocephalic and atraumatic.  Right Ear: External ear normal.  Left Ear: External ear normal.  Nose: Nose normal.  Mouth/Throat: Oropharynx is clear and moist. No oropharyngeal exudate.  Eyes: Pupils are equal, round, and reactive to light.  Neck: Normal range of motion. Neck supple. No JVD present. No tracheal deviation present. No thyromegaly present.  Cardiovascular: Normal rate and regular rhythm.   Pulmonary/Chest: Effort  normal. No stridor. No respiratory distress. She has no wheezes.  Abdominal: Soft. She exhibits no distension and no mass. There is tenderness. There is no rebound and no guarding.  LLQ tenderness  Musculoskeletal: Normal range of motion.  Lymphadenopathy:    She has no cervical adenopathy.  Neurological: She is alert and oriented to person, place, and time. She displays normal reflexes. No cranial nerve deficit or sensory deficit. She exhibits normal muscle tone. Coordination normal.  Skin: Skin is warm. Capillary refill takes less than 2  seconds. She is not diaphoretic.  Psychiatric: She has a normal mood and affect. Her behavior is normal. Thought content normal.  Nursing note and vitals reviewed.    ED Treatments / Results  Labs (all labs ordered are listed, but only abnormal results are displayed) Labs Reviewed  COMPREHENSIVE METABOLIC PANEL - Abnormal; Notable for the following:       Result Value   ALT 12 (*)    All other components within normal limits  CBC  URINALYSIS, ROUTINE W REFLEX MICROSCOPIC (NOT AT [facility])  I-STAT BETA HCG BLOOD, ED ([facility], [facility], [facility] ONLY)    EKG  EKG Interpretation None      Radiology No results found.  Procedures Procedures (including critical care time)  EMERGENCY DEPARTMENT US SOFT TISSUE INTERPRETATION "Study: Limited Ultrasound of the noted body part in comments below"  INDICATIONS: Pain Multiple views of the body part are obtained with a multi-frequency linear probe  PERFORMED BY:  Myself  IMAGES ARCHIVED?: Yes  SIDE:Left Neck  BODY PART:Neck  FINDINGS: No abcess noted, Cellulitis absent and Normal soft tissue ultrasound  LIMITATIONS: None  INTERPRETATION:  No abcess noted, No cellulitis noted and No abnormal findings noted   Medications Ordered in ED Medications - No data to display   Initial Impression / Assessment and Plan / ED Course  I have reviewed the triage vital signs and the nursing notes.  Pertinent labs & imaging results that were available during my care of the patient were reviewed by me and considered in my medical decision making (see chart for details).  Clinical Course    Patient is a 30 year old female who presents to emergency department today with left lateral neck pain for the past 3 months as well as abdominal pain is constant and unchanged from past 3 months. Patient states that the left lateral neck pain has been worked up most she has lymphadenopathy. She states that she has "lower amount of breathing because of it."  Patient is  unable to find a lump or place on her left neck. I examined palpated and auscultated her neck without any bruits, mass. She has no stridor. She is well appearing and has no trouble controlling her secretions.  Bedside US shows no acute findings.   Patient describes left lower quadrant abdominal pain has been constant for the past 3 months. She then changed her story to say those intermittent and comes and goes. She states that she occasionally has intermittent nausea and vomiting that does not appear to be related to her abdominal pain.No vaginal bleeding or discharge. Patient will would like to have a pelvic exam.   Pelvic exam unremarkable. Doubt ovarian torsion as story inconsistent and pelvic unremarkable. No evidence of PID, cervicitis. No concerns for STDS per patient. Patient's abdomen soft and without rebound or guarding. Negative Rosvings, negative psoas. Doubt appendicitis. No evidence of bowel obstruction or diverticulitis.  Patient overall well appearing with reassuring labs and normal vitals. I strongly encouraged  Ms. Kucera to follow up with a PCP for further management and evaluation of her pain.  Patient and agree with this plan discharge home without further incident   Final Clinical Impressions(s) / ED Diagnoses   Final diagnoses:  Neck pain  Abdominal pain, LLQ     Roberto Scales, MD 04/11/16 [id number]    Lajean Saver, MD 04/19/16 1249

## 2016-04-11 LAB — GC/CHLAMYDIA PROBE AMP (~~LOC~~) NOT AT ARMC
CHLAMYDIA, DNA PROBE: NEGATIVE
Neisseria Gonorrhea: NEGATIVE

## 2016-05-06 ENCOUNTER — Other Ambulatory Visit (HOSPITAL_COMMUNITY): Payer: Self-pay | Admitting: General Surgery

## 2016-05-06 DIAGNOSIS — K769 Liver disease, unspecified: Secondary | ICD-10-CM

## 2016-05-13 ENCOUNTER — Ambulatory Visit (HOSPITAL_COMMUNITY)
Admission: RE | Admit: 2016-05-13 | Discharge: 2016-05-13 | Disposition: A | Discharge status: Home / Self Care | Payer: Self-pay | Source: Ambulatory Visit | Attending: General Surgery | Admitting: General Surgery

## 2016-05-13 DIAGNOSIS — D1803 Hemangioma of intra-abdominal structures: Secondary | ICD-10-CM | POA: Insufficient documentation

## 2016-05-13 DIAGNOSIS — K769 Liver disease, unspecified: Secondary | ICD-10-CM | POA: Insufficient documentation

## 2016-05-13 MED ORDER — GADOBENATE DIMEGLUMINE 529 MG/ML IV SOLN
14.0000 mL | Freq: Once | INTRAVENOUS | Status: AC | PRN
  Administered 2016-05-13: 15 mL via INTRAVENOUS

## 2016-06-29 ENCOUNTER — Encounter: Payer: Self-pay | Admitting: Physician Assistant

## 2016-07-04 ENCOUNTER — Other Ambulatory Visit (INDEPENDENT_AMBULATORY_CARE_PROVIDER_SITE_OTHER): Payer: Self-pay

## 2016-07-04 ENCOUNTER — Encounter: Payer: Self-pay | Admitting: Physician Assistant

## 2016-07-04 ENCOUNTER — Ambulatory Visit (INDEPENDENT_AMBULATORY_CARE_PROVIDER_SITE_OTHER): Payer: Self-pay | Admitting: Physician Assistant

## 2016-07-04 VITALS — BP 114/72 | HR 74 | Ht 59.0 in | Wt 163.0 lb

## 2016-07-04 DIAGNOSIS — K5909 Other constipation: Secondary | ICD-10-CM

## 2016-07-04 DIAGNOSIS — R112 Nausea with vomiting, unspecified: Secondary | ICD-10-CM

## 2016-07-04 DIAGNOSIS — R1013 Epigastric pain: Secondary | ICD-10-CM

## 2016-07-04 LAB — CBC WITH DIFFERENTIAL/PLATELET
BASOS PCT: 0.4 % (ref 0.0–3.0)
Basophils Absolute: 0 10*3/uL (ref 0.0–0.1)
EOS ABS: 0 10*3/uL (ref 0.0–0.7)
EOS PCT: 0.6 % (ref 0.0–5.0)
HEMATOCRIT: 41.1 % (ref 36.0–46.0)
HEMOGLOBIN: 13.7 g/dL (ref 12.0–15.0)
LYMPHS PCT: 25.6 % (ref 12.0–46.0)
Lymphs Abs: 1.7 10*3/uL (ref 0.7–4.0)
MCHC: 33.3 g/dL (ref 30.0–36.0)
MCV: 89.1 fl (ref 78.0–100.0)
Monocytes Absolute: 0.6 10*3/uL (ref 0.1–1.0)
Monocytes Relative: 9.3 % (ref 3.0–12.0)
NEUTROS ABS: 4.1 10*3/uL (ref 1.4–7.7)
Neutrophils Relative %: 64.1 % (ref 43.0–77.0)
PLATELETS: 231 10*3/uL (ref 150.0–400.0)
RBC: 4.61 Mil/uL (ref 3.87–5.11)
RDW: 12.3 % (ref 11.5–15.5)
WBC: 6.5 10*3/uL (ref 4.0–10.5)

## 2016-07-04 LAB — SEDIMENTATION RATE: Sed Rate: 7 mm/hr (ref 0–20)

## 2016-07-04 LAB — H. PYLORI ANTIBODY, IGG: H Pylori IgG: POSITIVE — AB

## 2016-07-04 MED ORDER — ONDANSETRON HCL 4 MG PO TABS: ORAL_TABLET | ORAL | 0 refills | Status: DC

## 2016-07-04 MED ORDER — OMEPRAZOLE 40 MG PO CPDR: DELAYED_RELEASE_CAPSULE | ORAL | 2 refills | Status: DC

## 2016-07-04 NOTE — Patient Instructions (Addendum)
Please go to the basement level to have your labs drawn.   We sent prescriptions to [facility].  Take Miralax 17 grams in 8 oz of water.Natalie Thornton

## 2016-07-04 NOTE — Progress Notes (Signed)
Agree with assessment and plans. Amy have her follow up with you next visit for continuity. Thanks

## 2016-07-04 NOTE — Progress Notes (Signed)
Subjective:    Patient ID: Natalie Thornton, female    DOB: [birth year], 31 y.o.   MRN: [id number]  HPI Natalie Thornton  is a 31 year old African-American female, self-referred today for evaluation of abdominal pain, nausea and vomiting. Patient says that she has been having problems with nausea and vomiting over the past year. She says she may go several days to a week without an episode that it keeps recurring. She generally feels better after vomiting. She complains of epigastric discomfort and she says has been fairly constant for months and has a new "stabbing" "sticking" sensation in her left lower quadrant as well which is more recent. She denies any heartburn or dysphagia. Her appetite has  been somewhat decreased but her weight is stable and actually has been on the rise. She relates long-term history of constipation and generally uses Dulcolax suppositories as needed. She not on any regular aspirin or NSAIDs. She says she had been a daily drinker for many years and stopped last fall. Family history is positive for cirrhosis secondary to EtOH. Patient has had numerous ER visits for multiple different complaints. MRI of the abdomen was done on 05/12/2016 which showed a 1.4 cm hypervascular lesion in the dome of the liver consistent with benign focal nodular hyperplasia and also has a 1 cm hemangioma otherwise negative study.  Review of Systems Pertinent positive and negative review of systems were noted in the above HPI section.  All other review of systems was otherwise negative.  Outpatient Encounter Prescriptions as of 07/04/2016  Medication Sig  . cyclobenzaprine (FLEXERIL) 5 MG tablet Take 1 tablet (5 mg total) by mouth 3 (three) times daily as needed for muscle spasms. (Patient not taking: Reported on 04/10/2016)  . naproxen (NAPROSYN) 500 MG tablet Take 1 tablet (500 mg total) by mouth 2 (two) times daily as needed for mild pain, moderate pain or headache (TAKE WITH MEALS.). (Patient not taking:  Reported on 07/04/2016)  . omeprazole (PRILOSEC) 40 MG capsule Take 1 tab every morning.  . ondansetron (ZOFRAN) 4 MG tablet Take 1 tab every 6 hours as needed for nausea.  . polyethylene glycol (MIRALAX / GLYCOLAX) packet Take 17 g by mouth daily. (Patient not taking: Reported on 04/10/2016)  . traMADol (ULTRAM) 50 MG tablet Take 1 tablet (50 mg total) by mouth every 6 (six) hours as needed. (Patient not taking: Reported on 04/10/2016)   No facility-administered encounter medications on file as of 07/04/2016.    No Known Allergies Patient Active Problem List   Diagnosis Date Noted  . 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 Marital status: Married    Spouse name: N/A  . Number of children: 2  . Years of education: N/A   Occupational History  . Not on file.   Social History Main Topics  . Smoking status: Current Every Day Smoker    Packs/day: 0.00    Types: Cigarettes    Last attempt to quit: 09/06/2015  . Smokeless tobacco: Never Used  . Alcohol use Yes     Comment: occasionally  . Drug use: No  . Sexual activity: Not on file   Other Topics Concern  . Not on file   Social History Narrative  . No narrative on file    Natalie Thornton's family history includes AAA (abdominal aortic aneurysm) in her maternal grandmother; Cancer in her maternal grandmother; Cirrhosis in her maternal uncle; Head & neck cancer in her paternal grandmother.  Objective:    Vitals:   07/04/16 1330  BP: 114/72  Pulse: 74    Physical Exam well-developed young African-American female in no acute distress, accompanied by a friend blood pressure 114/72 pulse 74, Height 4 foot 11, weight 163, BMI 32.9. HEENT; nontraumatic normocephalic EOMI PERRLA sclera anicteric, Cardiovascular; regular rate and rhythm with S1-S2 no murmur rub or gallop, Pulmonary; clear bilaterally, Abdomen; soft, or Nissen the epigastrium there is no guarding or rebound no palpable mass or hepatosplenomegaly  bowel sounds present, Rectal ;exam not done, Extremities; no clubbing cyanosis or edema skin warm and dry, Neuropsych; mood and affect appropriate       Assessment & Plan:   #27 31 year old African-American female with complaints of intermittent nausea and vomiting 1 year associated with epigastric discomfort  #2 left lower quadrant pain #3 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 pylori antibody. Plan office follow-up in 3 weeks. May need EGD if no improvement in symptoms. Patient will be established with Dr. Corinna Gab PA-C 07/04/2016   Cc: No ref. provider found

## 2016-07-25 ENCOUNTER — Telehealth: Payer: Self-pay

## 2016-07-25 NOTE — Telephone Encounter (Signed)
Per Barb Merino. Move appointment to 07-27-2016 at 11 am. Pt will need to apply for [employer] discount insurance.

## 2016-07-25 NOTE — Telephone Encounter (Signed)
Appointment for 07-26-2016 with A.Esterwood has been cancelled pt cant pay her co-pay. She states that she still has a lot of symptoms including nausea, no appetite and had constipation with hard to pass movements. Was due to follow up for post treatment of Pylera.

## 2016-07-26 ENCOUNTER — Ambulatory Visit: Payer: Self-pay | Admitting: Physician Assistant

## 2016-07-27 ENCOUNTER — Ambulatory Visit (INDEPENDENT_AMBULATORY_CARE_PROVIDER_SITE_OTHER): Payer: Self-pay | Admitting: Physician Assistant

## 2016-07-27 ENCOUNTER — Encounter: Payer: Self-pay | Admitting: Physician Assistant

## 2016-07-27 VITALS — BP 100/60 | HR 58 | Ht 59.0 in | Wt 164.0 lb

## 2016-07-27 DIAGNOSIS — R768 Other specified abnormal immunological findings in serum: Secondary | ICD-10-CM

## 2016-07-27 DIAGNOSIS — R1013 Epigastric pain: Secondary | ICD-10-CM

## 2016-07-27 DIAGNOSIS — K5909 Other constipation: Secondary | ICD-10-CM

## 2016-07-27 NOTE — Progress Notes (Signed)
Reviewed and agree.

## 2016-07-27 NOTE — Patient Instructions (Signed)
Please complete a Miralax bowel purge. Follow the separate sheet of instructions.  Start Amitiza 24 mcg twice a day AFTER completing the bowel purge. We have also sent in a RX for the Amitiza.  Plylera samples have been given to you. Please complete the whole 10 days of treatment.  If you are age 29 or older, your body mass index should be between 23-30. Your Body mass index is 33.12 kg/m. If this is out of the aforementioned range listed, please consider follow up with your Primary Care Provider.  If you are age 67 or younger, your body mass index should be between 19-25. Your Body mass index is 33.12 kg/m. If this is out of the aformentioned range listed, please consider follow up with your Primary Care Provider.   Thank you for choosing  GI

## 2016-07-27 NOTE — Progress Notes (Signed)
Subjective:    Patient ID: Natalie Thornton, female    DOB: July 18, 1985, 31 y.o.   MRN: [id number]  HPI Delani is a pleasant 31 year old African-American female, new to GI when she was seen by myself about a month ago at that time with complaints of epigastric pain and intermittent vomiting. She had also had complaints of constipation. She had had a recent MRI which showed focal nodular hyperplasia and a 1.4 cm area in the dome of the liver and a 1 cm hepatic hemangioma. She has prior history of EtOH abuse which has been inactive. She was given Zofran as needed for nausea and started on omeprazole 40 mg by mouth daily. Also advised MiraLAX on a daily basis for constipation. As part of her labs H. pylori antibody was checked and this was positive. She was supposed to have been given a sample pack of Pylera but says she did not receive any phone call regarding Pylera. She says she was unable to take omeprazole because it caused headaches. Today she seems primarily focused on constipation. She says this is been getting progressively worse but she's had constipation problems for years. This point she will not have a bowel movement without laxatives and says frequently she has to use of Dulcolax suppository in order to produce a bowel movement. She's been using MiraLAX twice daily and still going longer than a week without bowel movements. She says she was so constipated a week or so ago she had to get her friend to try to help sort of disimpact her. He says when she gets very constipated she has some discomfort in her lower abdomen and in her rectum. She's not had any rectal bleeding.   There is no family history of IBD  or colon cancer.  Review of Systems Pertinent positive and negative review of systems were noted in the above HPI section.  All other review of systems was otherwise negative.  Outpatient Encounter Prescriptions as of 07/27/2016  Medication Sig  . cyclobenzaprine (FLEXERIL) 5 MG tablet  Take 1 tablet (5 mg total) by mouth 3 (three) times daily as needed for muscle spasms. (Patient not taking: Reported on 04/10/2016)  . naproxen (NAPROSYN) 500 MG tablet Take 1 tablet (500 mg total) by mouth 2 (two) times daily as needed for mild pain, moderate pain or headache (TAKE WITH MEALS.). (Patient not taking: Reported on 07/04/2016)  . omeprazole (PRILOSEC) 40 MG capsule Take 1 tab every morning. (Patient not taking: Reported on 07/27/2016)  . ondansetron (ZOFRAN) 4 MG tablet Take 1 tab every 6 hours as needed for nausea. (Patient not taking: Reported on 07/27/2016)  . polyethylene glycol (MIRALAX / GLYCOLAX) packet Take 17 g by mouth daily. (Patient not taking: Reported on 04/10/2016)  . traMADol (ULTRAM) 50 MG tablet Take 1 tablet (50 mg total) by mouth every 6 (six) hours as needed. (Patient not taking: Reported on 04/10/2016)   No facility-administered encounter medications on file as of 07/27/2016.    No Known Allergies Patient Active Problem List   Diagnosis Date Noted  . 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 Marital status: Married    Spouse name: N/A  . Number of children: 2  . Years of education: N/A   Occupational History  . Not on file.   Social History Main Topics  . Smoking status: Current Every Day Smoker    Packs/day: 0.00    Types: Cigarettes    Last attempt to quit:  09/06/2015  . Smokeless tobacco: Never Used  . Alcohol use Yes     Comment: occasionally  . Drug use: No  . Sexual activity: Not on file   Other Topics Concern  . Not on file   Social History Narrative  . No narrative on file    Ms. Erxleben's family history includes AAA (abdominal aortic aneurysm) in her maternal grandmother; Cancer in her maternal grandmother; Cirrhosis in her maternal uncle; Head & neck cancer in her paternal grandmother.      Objective:    Vitals:   07/27/16 1049  BP: 100/60  Pulse: (!) 58    Physical Exam well-developed young  African-American female in no acute distress, accompanied by her friend blood pressure 100/60 pulse 58, height 4 foot 11, weight 164, BMI 33. HEENT; nontraumatic normocephalic EOMI PERRLA sclera anicteric, Cardiovascular; regular rate and rhythm with S1-S2 no murmur or gallop, Pulmonary; clear bilaterally, Abdomen; soft, bowel sounds are present she has tenderness in the left lower quadrant there is no guarding or rebound no palpable mass or hepatosplenomegaly, Rectal ;exam not done, Extremities; no clubbing cyanosis or edema skin warm and dry, Neuropsych; mood and affect appropriate       Assessment & Plan:   #76 31 year old African-American female with chronic constipation, worsening with complaints of mild rectal and left lower quadrant discomfort with obstipation. 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 pylori antibody positive and will give her a course of Pylera #3  1.4 cm area of Williams dome of liver on recent MRI  Plan; patient instructed today in a MiraLAX purge, she will purge her bowel and then start on samples of Amitiza 24 g by mouth twice a day, prescription also sent. If this is effective she can stop MiraLAX If she continues to have problems despite the above regimen she will need colonoscopy with Dr. Henrene Pastor. Colonoscopy was discussed in detail today Will complete a course of Pylera Patient also encouraged to start high fiber diet with liberal water intake. Follow-up with myself as needed.  Plan  Alizee Maple Genia Harold PA-C 07/27/2016   Cc: No ref. provider found

## 2016-08-03 ENCOUNTER — Telehealth: Payer: Self-pay | Admitting: Physician Assistant

## 2016-08-03 NOTE — Telephone Encounter (Signed)
Spoke with the patient. She reports she is vomiting some of her doses of the Pylera pack. She is still trying to take it as directed. Some doses she is able to retain. Amitiza has produced bowel movements x2. She continues to use Miralax. She would like to have the colonoscopy. Please advise.

## 2016-08-03 NOTE — Telephone Encounter (Signed)
Patient is agreeable to this plan. She will continue the Pylera. Take with food on her stomach as is directed. Amitiza daily. Pre-visit tomorrow at 4 pm with the plans for colonoscopy 08/12/16 at 10:30 am.

## 2016-08-03 NOTE — Telephone Encounter (Signed)
Ok.. She should try to complete Pylera .Marland Kitchen Go ahead and schedule for Colonoscopy with Dr Henrene Pastor. Stay on Amitiza, use Miralax as needed- explain that Amitiza may not make her have aBM every day but will increase the number of times per week she has  a BM

## 2016-08-04 ENCOUNTER — Ambulatory Visit: Payer: Self-pay

## 2016-08-04 VITALS — Ht 59.0 in | Wt 164.8 lb

## 2016-08-04 DIAGNOSIS — K5909 Other constipation: Secondary | ICD-10-CM

## 2016-08-04 MED ORDER — NA SULFATE-K SULFATE-MG SULF 17.5-3.13-1.6 GM/177ML PO SOLN: 1.0000 | Freq: Once | ORAL | 0 refills | Status: AC

## 2016-08-04 NOTE — Progress Notes (Signed)
No allergies to eggs or soy No diet meds No home oxygen No past problems with anesthesia  Registered for emmi 

## 2016-08-12 ENCOUNTER — Encounter: Payer: Self-pay | Admitting: Internal Medicine

## 2016-08-12 ENCOUNTER — Ambulatory Visit (AMBULATORY_SURGERY_CENTER): Payer: Self-pay | Admitting: Internal Medicine

## 2016-08-12 VITALS — BP 112/68 | HR 72 | Temp 96.8°F | Resp 12 | Ht 59.0 in | Wt 164.0 lb

## 2016-08-12 DIAGNOSIS — K5909 Other constipation: Secondary | ICD-10-CM

## 2016-08-12 DIAGNOSIS — R1013 Epigastric pain: Secondary | ICD-10-CM

## 2016-08-12 DIAGNOSIS — R1032 Left lower quadrant pain: Secondary | ICD-10-CM

## 2016-08-12 MED ORDER — SODIUM CHLORIDE 0.9 % IV SOLN: 500.0000 mL | INTRAVENOUS | Status: DC

## 2016-08-12 NOTE — Op Note (Signed)
[place] Patient Name: Natalie Thornton Procedure Date: 08/12/2016 10:48 AM MRN: [id number] Endoscopist: Docia Chuck. Henrene Pastor , MD Age: 31 Referring MD:  Date of Birth: 07/29/1985 Gender: Female Account #: [id number] Procedure:                Colonoscopy Indications:              Abdominal pain in the left lower quadrant,                            Constipation Medicines:                Monitored Anesthesia Care Procedure:                Pre-Anesthesia Assessment:                           - Prior to the procedure, a History and Physical                            was performed, and patient medications and                            allergies were reviewed. The patient's tolerance of                            previous anesthesia was also reviewed. The risks                            and benefits of the procedure and the sedation                            options and risks were discussed with the patient.                            All questions were answered, and informed consent                            was obtained. Prior Anticoagulants: The patient has                            taken no previous anticoagulant or antiplatelet                            agents. ASA Grade Assessment: I - A normal, healthy                            patient. After reviewing the risks and benefits,                            the patient was deemed in satisfactory condition to                            undergo the procedure.  After obtaining informed consent, the colonoscope                            was passed under direct vision. Throughout the                            procedure, the patient's blood pressure, pulse, and                            oxygen saturations were monitored continuously. The                            Colonoscope was introduced through the anus and                            advanced to the the cecum, identified by   appendiceal orifice and ileocecal valve. The                            terminal ileum, ileocecal valve, appendiceal                            orifice, and rectum were photographed. The quality                            of the bowel preparation was excellent. The                            colonoscopy was performed without difficulty. The                            patient tolerated the procedure well. The bowel                            preparation used was SUPREP. Scope In: 10:53:52 AM Scope Out: 11:02:04 AM Scope Withdrawal Time: 0 hours 5 minutes 20 seconds  Total Procedure Duration: 0 hours 8 minutes 12 seconds  Findings:                 The terminal ileum appeared normal.                           The entire examined colon appeared normal on direct                            and retroflexion views. Complications:            No immediate complications. Estimated blood loss:                            None. Estimated Blood Loss:     Estimated blood loss: none. Impression:               - The examined portion of the ileum was normal.                           -  The entire examined colon is normal on direct and                            retroflexion views.                           - No specimens collected. Recommendation:           - Repeat colonoscopy at age 44 for screening                            purposes.                           - Patient has a contact number available for                            emergencies. The signs and symptoms of potential                            delayed complications were discussed with the                            patient. Return to normal activities tomorrow.                            Written discharge instructions were provided to the                            patient.                           - Resume previous diet.                           - Recommend taking MiraLAX daily. Adjust the dose                            to achieve the  desired result. Docia Chuck. Henrene Pastor, MD 08/12/2016 11:08:04 AM This report has been signed electronically.

## 2016-08-12 NOTE — Patient Instructions (Signed)
YOU HAD AN ENDOSCOPIC PROCEDURE TODAY AT [facility]:   Refer to the procedure report that was given to you for any specific questions about what was found during the examination.  If the procedure report does not answer your questions, please call your gastroenterologist to clarify.  If you requested that your care partner not be given the details of your procedure findings, then the procedure report has been included in a sealed envelope for you to review at your convenience later.  YOU SHOULD EXPECT: Some feelings of bloating in the abdomen. Passage of more gas than usual.  Walking can help get rid of the air that was put into your GI tract during the procedure and reduce the bloating. If you had a lower endoscopy (such as a colonoscopy or flexible sigmoidoscopy) you may notice spotting of blood in your stool or on the toilet paper. If you underwent a bowel prep for your procedure, you may not have a normal bowel movement for a few days.  Please Note:  You might notice some irritation and congestion in your nose or some drainage.  This is from the oxygen used during your procedure.  There is no need for concern and it should clear up in a day or so.  SYMPTOMS TO REPORT IMMEDIATELY:   Following lower endoscopy (colonoscopy or flexible sigmoidoscopy):  Excessive amounts of blood in the stool  Significant tenderness or worsening of abdominal pains  Swelling of the abdomen that is new, acute  Fever of 100F or higher   For urgent or emergent issues, a gastroenterologist can be reached at any hour by calling [phone].   DIET:  We do recommend a small meal at first, but then you may proceed to your regular diet.  Drink plenty of fluids but you should avoid alcoholic beverages for 24 hours.  ACTIVITY:  You should plan to take it easy for the rest of today and you should NOT DRIVE or use heavy machinery until tomorrow (because of the sedation medicines used during the test).     FOLLOW UP: Our staff will call the number listed on your records the next business day following your procedure to check on you and address any questions or concerns that you may have regarding the information given to you following your procedure. If we do not reach you, we will leave a message.  However, if you are feeling well and you are not experiencing any problems, there is no need to return our call.  We will assume that you have returned to your regular daily activities without incident.  If any biopsies were taken you will be contacted by phone or by letter within the next 1-3 weeks.  Please call us at [phone] if you have not heard about the biopsies in 3 weeks.    Repeat Colonoscopy at age 31 Recommend taking MiraLAX daily  SIGNATURES/CONFIDENTIALITY: You and/or your care partner have signed paperwork which will be entered into your electronic medical record.  These signatures attest to the fact that that the information above on your After Visit Summary has been reviewed and is understood.  Full responsibility of the confidentiality of this discharge information lies with you and/or your care-partner.

## 2016-08-12 NOTE — Progress Notes (Signed)
To PACU, vss patent aw report to rn 

## 2016-08-23 ENCOUNTER — Telehealth: Payer: Self-pay | Admitting: Internal Medicine

## 2016-08-23 NOTE — Telephone Encounter (Signed)
Left message for pt to call back  °

## 2016-08-24 MED ORDER — DICYCLOMINE HCL 20 MG PO TABS: ORAL_TABLET | ORAL | 2 refills | Status: DC

## 2016-08-24 NOTE — Telephone Encounter (Signed)
1. Make sure she sees a gynecologist ASAP for a thorough evaluation regarding her pain since her colonoscopy was negative in her bowel habits are improved 2. Prescribed Bentyl 20 mg; #60; 2 refills; one by mouth every 4-6 hours as needed for severe cramping pain

## 2016-08-24 NOTE — Telephone Encounter (Signed)
Pt has no insurance. Pt scheduled to see Dr. Toney Rakes 09/01/16@10 :30am, pt to arrive there at 10:15am. Pt aware of appt date and time and office address.

## 2016-08-24 NOTE — Telephone Encounter (Signed)
Spoke with pt and she is aware. Script sent to the pharmacy.

## 2016-08-24 NOTE — Telephone Encounter (Signed)
At 31 years old she should have a GYN. If not, Dr. Paula Compton

## 2016-08-24 NOTE — Telephone Encounter (Signed)
Pt states that her bowels are fine since her colonoscopy but she is still having abdominal pain. Pt states that the discomfort is all across her abdomen, constant, cramping at times and sharp other times. Also states there is an area in her LLQ where it feels as if something is "poking her." Pt wants to know what else Dr. Henrene Pastor might recommend. Please advise.

## 2016-08-24 NOTE — Telephone Encounter (Signed)
error 

## 2016-08-24 NOTE — Telephone Encounter (Signed)
Pt called back and states she does not have a PCP and has never seen a GYN. Who would you like to refer her to?

## 2016-09-01 ENCOUNTER — Ambulatory Visit: Payer: Self-pay | Admitting: Gynecology

## 2016-09-01 ENCOUNTER — Emergency Department (HOSPITAL_COMMUNITY): Payer: Self-pay

## 2016-09-01 ENCOUNTER — Emergency Department (HOSPITAL_COMMUNITY)
Admission: EM | Admit: 2016-09-01 | Discharge: 2016-09-01 | Disposition: A | Discharge status: Home / Self Care | Payer: Self-pay | Attending: Emergency Medicine | Admitting: Emergency Medicine

## 2016-09-01 ENCOUNTER — Encounter (HOSPITAL_COMMUNITY): Payer: Self-pay

## 2016-09-01 DIAGNOSIS — Z79899 Other long term (current) drug therapy: Secondary | ICD-10-CM | POA: Insufficient documentation

## 2016-09-01 DIAGNOSIS — F17210 Nicotine dependence, cigarettes, uncomplicated: Secondary | ICD-10-CM | POA: Insufficient documentation

## 2016-09-01 DIAGNOSIS — N939 Abnormal uterine and vaginal bleeding, unspecified: Secondary | ICD-10-CM | POA: Insufficient documentation

## 2016-09-01 DIAGNOSIS — M25511 Pain in right shoulder: Secondary | ICD-10-CM | POA: Insufficient documentation

## 2016-09-01 LAB — WET PREP, GENITAL
CLUE CELLS WET PREP: NONE SEEN
SPERM: NONE SEEN
Trich, Wet Prep: NONE SEEN
Yeast Wet Prep HPF POC: NONE SEEN

## 2016-09-01 LAB — URINALYSIS, ROUTINE W REFLEX MICROSCOPIC
BILIRUBIN URINE: NEGATIVE
GLUCOSE, UA: NEGATIVE mg/dL
HGB URINE DIPSTICK: NEGATIVE
Ketones, ur: 5 mg/dL — AB
NITRITE: NEGATIVE
PH: 8 (ref 5.0–8.0)
Protein, ur: NEGATIVE mg/dL
SPECIFIC GRAVITY, URINE: 1.016 (ref 1.005–1.030)

## 2016-09-01 LAB — POC URINE PREG, ED: Preg Test, Ur: NEGATIVE

## 2016-09-01 MED ORDER — LIDOCAINE 5 % EX PTCH: 1.0000 | MEDICATED_PATCH | CUTANEOUS | 0 refills | Status: DC

## 2016-09-01 MED ORDER — NAPROXEN 375 MG PO TABS: 375.0000 mg | ORAL_TABLET | Freq: Two times a day (BID) | ORAL | 0 refills | Status: DC

## 2016-09-01 MED ORDER — METHOCARBAMOL 500 MG PO TABS: 500.0000 mg | ORAL_TABLET | Freq: Two times a day (BID) | ORAL | 0 refills | Status: DC

## 2016-09-01 NOTE — ED Provider Notes (Signed)
Medical screening examination/treatment/procedure(s) were conducted as a shared visit with non-physician practitioner(s) and myself.  I personally evaluated the patient during the encounter.  All who is here for multiple complaints but the biggest worry is 3 months of right periscapular back pain. This is better with massage worse with movement. Worse with certain positions. Has been waxing and waning for the last 3 months. On exam she has tenderness in the likely muscle spasm just under her right scapula that re-creates her symptoms. Patient asked me to do an ultrasound to make sure is nothing wrong there is no evidence of pneumothorax, abscess, fracture or abnormal growths. She's PERC negative so a PE is unlikely. It is more muscular in nature and will treat it appropriately.. Otherwise follow with primary doctor for multiple other complaints   Merrily Pew, MD 09/07/16 1704

## 2016-09-01 NOTE — ED Provider Notes (Signed)
[facility] DEPT Provider Note   CSN: [id number] Arrival date & time: 09/01/16  1407   By signing my name below, I, Neta Mends, attest that this documentation has been prepared under the direction and in the presence of Melina Schools, PA-C. Electronically Signed: Neta Mends, ED Scribe. 09/01/2016. 3:25 PM.   History   Chief Complaint Chief Complaint  Patient presents with  . Back Pain    The history is provided by the patient. No language interpreter was used.   HPI Comments:  Natalie Thornton is a 31 y.o. female who presents to the Emergency Department complaining of ongoing upper back/neck pain that has been waxing and waning for 3 months. She states that the pain is worse with movement, and describes the pain as "deep and aching." Pt also reports that she has seen blood on the toilet paper when wiping after urinating for 1 month, and states that this occurs when she is not on her period. She reports mild pain with urination. LNMP was earlier this month. Pt has been getting massages with mild, temporary relief for pain, and has taken ibuprofen and naproxen with mild relief. Pt denies any injury/trauma. Denies hx of DVT/PE, denies recent hospitalizations, recent long travel, denies cardiac hx, and does not use birth control. Pt denies pelvic pain, vaginalDischarge, abdominal pain, change in bowel habits, fever or nausea, emesis.  Past Medical History:  Diagnosis Date  . Anxiety   . Arthritis   . DDD (degenerative disc disease)   . Degenerative disc disease, cervical   . Disc disease, degenerative, cervical   . Pilonidal cyst     Patient Active Problem List   Diagnosis Date Noted  . Pilonidal sinus 07/05/2011    Past Surgical History:  Procedure Laterality Date  . NO PAST SURGERIES      OB History    No data available       Home Medications    Prior to Admission medications   Medication Sig Start Date End Date Taking? Authorizing Provider    bismuth-metronidazole-tetracycline [facility]) [phone] MG capsule Take 3 capsules by mouth 4 (four) times daily -  before meals and at bedtime.    Historical Provider, MD  dicyclomine (BENTYL) 20 MG tablet Take 1 by mouth every 4-6 hours as needed for cramping 08/24/16   Irene Shipper, MD    Family History Family History  Problem Relation Age of Onset  . Cancer Maternal Grandmother     breast  . AAA (abdominal aortic aneurysm) Maternal Grandmother   . Head & neck cancer Paternal Grandmother     Back cancer  . Cirrhosis Maternal Uncle   . Colon cancer Neg Hx   . Esophageal cancer Neg Hx   . Rectal cancer Neg Hx   . Stomach cancer Neg Hx     Social History Social History  Substance Use Topics  . Smoking status: Current Every Day Smoker    Types: Cigarettes    Last attempt to quit: 09/06/2015  . Smokeless tobacco: Never Used  . Alcohol use Yes     Comment: occasionally     Allergies   Patient has no known allergies.   Review of Systems Review of Systems  Constitutional: Negative for chills and fever.  Respiratory: Negative for cough and shortness of breath.   Cardiovascular: Negative for chest pain, palpitations and leg swelling.  Gastrointestinal: Negative for abdominal pain, blood in stool, diarrhea, nausea and vomiting.  Genitourinary: Positive for vaginal bleeding (spottin). Negative  for dysuria, flank pain, hematuria, urgency, vaginal discharge and vaginal pain.  Musculoskeletal: Positive for back pain, myalgias and neck pain.  Neurological: Negative for dizziness, syncope, weakness, light-headedness and headaches.     Physical Exam Updated Vital Signs BP 123/77 (BP Location: Left Arm)   Pulse 87   Temp 98.9 F (37.2 C) (Oral)   Resp 18   SpO2 100%   Physical Exam  Constitutional: She is oriented to person, place, and time. She appears well-developed and well-nourished. No distress.  HENT:  Head: Normocephalic and atraumatic.  Eyes: Conjunctivae are  normal.  Neck: Normal range of motion. Neck supple.  No midline tenderness. No deformity or step-offs.  Cardiovascular: Normal rate, regular rhythm, normal heart sounds and intact distal pulses.   Pulmonary/Chest: Effort normal and breath sounds normal. No respiratory distress. She has no wheezes. She has no rales. She exhibits no tenderness.  Abdominal: Soft. Bowel sounds are normal. She exhibits no distension. There is no tenderness. There is no rebound and no guarding.  Genitourinary: Vagina normal. There is no rash, tenderness or lesion on the right labia. There is no rash, tenderness or lesion on the left labia. Cervix exhibits no motion tenderness, no discharge and no friability. Right adnexum displays no mass, no tenderness and no fullness. Left adnexum displays no mass, no tenderness and no fullness. No erythema, tenderness or bleeding in the vagina. No vaginal discharge found.  Genitourinary Comments: Chaperone present for exam. Patient tolerated any difficulties.  Musculoskeletal: Normal range of motion.       Arms: Tenderness to palpation and movement. Tense musculature muscle spasms noted.  Neurological: She is alert and oriented to person, place, and time.  Skin: Skin is warm and dry. Capillary refill takes less than 2 seconds.  Psychiatric: She has a normal mood and affect.  Nursing note and vitals reviewed.    ED Treatments / Results  DIAGNOSTIC STUDIES:  Oxygen Saturation is 100% on RA, normal by my interpretation.    COORDINATION OF CARE:  3:25 PM  Discussed treatment plan with pt at bedside and pt agreed to plan.   Labs (all labs ordered are listed, but only abnormal results are displayed) Labs Reviewed  POC URINE PREG, ED    EKG  EKG Interpretation None       Radiology No results found.  Procedures Procedures (including critical care time)  Medications Ordered in ED Medications - No data to display   Initial Impression / Assessment and Plan / ED  Course  I have reviewed the triage vital signs and the nursing notes.  Pertinent labs & imaging results that were available during my care of the patient were reviewed by me and considered in my medical decision making (see chart for details).     Patient presents to the ED with multiple complaints. Patient's biggest worry is a 3 month history of right periscapular back pain that has been intermittent. Worse with certain movements improved with massage. She has had tenderness on exam with muscle spasms noted. Chest x-ray and right shoulder x-ray are unremarkable for any acute findings. Vital signs are stable she is not hypoxic and no tachypnea. No tachycardia. She is PERC negative so PE is unlikely. Patient denies chest pain concerning for ACS. Patientwas seen and evaluated by Dr. Dolly Rias who performed an ultrasound and no signs of pneumothorax, abscess, fracture or abnormal growths. This is likely muscular in nature and will treat with muscle relaxers and anti-inflammatories. Patient also complained of vaginal spotting on  and off for the past month. Pregnancy test is negative. Low suspicion for ectopic pregnancy. She denies any urinary symptoms. Urine shows moderate leukocytes and few bacteria with squamous epithelials. Given the patient not had any symptoms we'll culture and not treat at this time. Pelvic exam was unremarkable without any cervical motion tenderness, adnexal tenderness, vaginal discharge. GC and chlamydia probes are pending. Wet prep shows WBCs but no other findings. Patient could have mild cervicitis with her intermittent spotting. We'll wait for the cultures to determine if treatment is necessary will not treat at this time. I'll encourage patient to follow back up with her primary care doctor and have given her referral to OB/GYN. Patient does not have any abdominal pain or pelvic pain concerning for enter abdominal or pelvic abnormalities. Have given patient strict return precautions.  Dr. Dolly Rias evaluated patient and agrees with the above plan. All questions were answered prior to discharge. She is hemodynamically stable with normal vital signs in no acute distress on discharge.  Fi  nal Clinical Impressions(s) / ED Diagnoses   Final diagnoses:  Acute pain of right shoulder  Vaginal spotting    New Prescriptions Discharge Medication List as of 09/01/2016  5:26 PM    START taking these medications   Details  lidocaine (LIDODERM) 5 % Place 1 patch onto the skin daily. Remove & Discard patch within 12 hours or as directed by MD, Starting Thu 09/01/2016, Print    methocarbamol (ROBAXIN) 500 MG tablet Take 1 tablet (500 mg total) by mouth 2 (two) times daily., Starting Thu 09/01/2016, Print    naproxen (NAPROSYN) 375 MG tablet Take 1 tablet (375 mg total) by mouth 2 (two) times daily., Starting Thu 09/01/2016, Print      I personally performed the services described in this documentation, which was scribed in my presence. The recorded information has been reviewed and is accurate.     Doristine Devoid, PA-C 09/06/16 1137    Merrily Pew, MD 09/07/16 [id number]

## 2016-09-01 NOTE — Discharge Instructions (Signed)
Your chest x-ray were normal. This could be musculoskeletal pain. Take the naproxen as needed for pain. Use the Robaxin for muscle relaxation. Make sure you do not take any Advil, Motrin, ibuprofen, Aleve with the naproxen. Use lidocaine patches to the affected area as prescribed. Do not see any vaginal bleeding on your exam. I have sent a urine culture. If it comes back for possible infection will be notified and given antibiotics. Have given you a referral to the OB/GYN. I would encourage her to follow-up to make an appointment with them to establish care and for further investigation of your bleeding. Have also given you a referral to the orthopedist if your symptoms persist. Return to the ED if he develops any worsening pain, worsening swelling, worsening bleeding, fever, abdominal pain or for any other reason.

## 2016-09-01 NOTE — ED Triage Notes (Signed)
Patient complains of back and neck pain for greater than 3 months. Denies trauma. States that the pain is worse to posterior right shoulder and pain with movement. NAD

## 2016-09-02 LAB — GC/CHLAMYDIA PROBE AMP (~~LOC~~) NOT AT ARMC
CHLAMYDIA, DNA PROBE: NEGATIVE
NEISSERIA GONORRHEA: NEGATIVE

## 2016-09-03 LAB — URINE CULTURE: Culture: >=100000 — AB

## 2016-09-04 ENCOUNTER — Telehealth: Payer: Self-pay

## 2016-09-04 NOTE — Telephone Encounter (Signed)
No treatment needed for UC 3/29.18 per Willis Modena Pharm D

## 2016-10-11 ENCOUNTER — Emergency Department (HOSPITAL_COMMUNITY): Payer: Self-pay

## 2016-10-11 ENCOUNTER — Emergency Department (HOSPITAL_COMMUNITY)
Admission: EM | Admit: 2016-10-11 | Discharge: 2016-10-11 | Disposition: A | Discharge status: Home / Self Care | Payer: Self-pay | Attending: Emergency Medicine | Admitting: Emergency Medicine

## 2016-10-11 ENCOUNTER — Encounter (HOSPITAL_COMMUNITY): Payer: Self-pay | Admitting: Emergency Medicine

## 2016-10-11 DIAGNOSIS — J189 Pneumonia, unspecified organism: Secondary | ICD-10-CM

## 2016-10-11 DIAGNOSIS — J181 Lobar pneumonia, unspecified organism: Secondary | ICD-10-CM | POA: Insufficient documentation

## 2016-10-11 DIAGNOSIS — F17210 Nicotine dependence, cigarettes, uncomplicated: Secondary | ICD-10-CM | POA: Insufficient documentation

## 2016-10-11 LAB — URINALYSIS, ROUTINE W REFLEX MICROSCOPIC
BILIRUBIN URINE: NEGATIVE
GLUCOSE, UA: NEGATIVE mg/dL
HGB URINE DIPSTICK: NEGATIVE
Ketones, ur: 5 mg/dL — AB
NITRITE: NEGATIVE
PROTEIN: NEGATIVE mg/dL
SPECIFIC GRAVITY, URINE: 1.02 (ref 1.005–1.030)
pH: 5 (ref 5.0–8.0)

## 2016-10-11 LAB — I-STAT CG4 LACTIC ACID, ED: Lactic Acid, Venous: 0.78 mmol/L (ref 0.5–1.9)

## 2016-10-11 LAB — CBC WITH DIFFERENTIAL/PLATELET
BASOS ABS: 0 10*3/uL (ref 0.0–0.1)
BASOS PCT: 0 %
EOS ABS: 0.1 10*3/uL (ref 0.0–0.7)
Eosinophils Relative: 1 %
HEMATOCRIT: 39.9 % (ref 36.0–46.0)
HEMOGLOBIN: 13.1 g/dL (ref 12.0–15.0)
Lymphocytes Relative: 26 %
Lymphs Abs: 1.8 10*3/uL (ref 0.7–4.0)
MCH: 28.9 pg (ref 26.0–34.0)
MCHC: 32.8 g/dL (ref 30.0–36.0)
MCV: 88.1 fL (ref 78.0–100.0)
MONOS PCT: 8 %
Monocytes Absolute: 0.6 10*3/uL (ref 0.1–1.0)
NEUTROS ABS: 4.7 10*3/uL (ref 1.7–7.7)
NEUTROS PCT: 65 %
Platelets: 264 10*3/uL (ref 150–400)
RBC: 4.53 MIL/uL (ref 3.87–5.11)
RDW: 12.4 % (ref 11.5–15.5)
WBC: 7.1 10*3/uL (ref 4.0–10.5)

## 2016-10-11 LAB — COMPREHENSIVE METABOLIC PANEL
ALK PHOS: 40 U/L (ref 38–126)
ALT: 12 U/L — AB (ref 14–54)
AST: 18 U/L (ref 15–41)
Albumin: 4.2 g/dL (ref 3.5–5.0)
Anion gap: 8 (ref 5–15)
BUN: 7 mg/dL (ref 6–20)
CHLORIDE: 106 mmol/L (ref 101–111)
CO2: 24 mmol/L (ref 22–32)
CREATININE: 0.87 mg/dL (ref 0.44–1.00)
Calcium: 9.6 mg/dL (ref 8.9–10.3)
Glucose, Bld: 93 mg/dL (ref 65–99)
Potassium: 3.7 mmol/L (ref 3.5–5.1)
Sodium: 138 mmol/L (ref 135–145)
TOTAL PROTEIN: 7 g/dL (ref 6.5–8.1)
Total Bilirubin: 0.6 mg/dL (ref 0.3–1.2)

## 2016-10-11 MED ORDER — AZITHROMYCIN 250 MG PO TABS: ORAL_TABLET | ORAL | 0 refills | Status: DC

## 2016-10-11 NOTE — ED Provider Notes (Signed)
[facility] DEPT Provider Note   CSN: [id number] Arrival date & time: 10/11/16  1259     History   Chief Complaint Chief Complaint  Patient presents with  . Chest Pain  . Abdominal Pain    HPI Natalie Thornton is a 31 y.o. female.  Patient does report having a history of chronic abdominal pain as well as chest pain. Has had an extensive workup. EGD and colonoscopy has been performed. Currently being treated for H. pylori. She also had an outpatient MRI of her abdomen which showed evidence of liver lesions, benign in nature, but no acute pathology. She is now presenting with increasing cough over the last 1 week and shortness of breath. Endorses subjective fevers and chills. No change in her chest pain or  or abdominal pain. No other infectious symptoms.   The history is provided by the patient.  Illness  This is a new problem. The current episode started more than 1 week ago. The problem occurs constantly. The problem has not changed since onset.Associated symptoms include chest pain (chronic), abdominal pain (chronic) and shortness of breath. She has tried nothing for the symptoms.    Past Medical History:  Diagnosis Date  . Anxiety   . Arthritis   . DDD (degenerative disc disease)   . Degenerative disc disease, cervical   . Disc disease, degenerative, cervical   . Pilonidal cyst     Patient Active Problem List   Diagnosis Date Noted  . Pilonidal sinus 07/05/2011    Past Surgical History:  Procedure Laterality Date  . COLONOSCOPY    . NO PAST SURGERIES      OB History    No data available       Home Medications    Prior to Admission medications   Medication Sig Start Date End Date Taking? Authorizing Provider  azithromycin (ZITHROMAX Z-PAK) 250 MG tablet Take 500 mg day 1. Take 250 mg day 2-5. 10/11/16   Maryan Puls, MD  bismuth-metronidazole-tetracycline [facility]) [phone] MG capsule Take 3 capsules by mouth 4 (four) times daily -  before meals and  at bedtime.    [provider]  dicyclomine (BENTYL) 20 MG tablet Take 1 by mouth every 4-6 hours as needed for cramping 08/24/16   Irene Shipper, MD  lidocaine (LIDODERM) 5 % Place 1 patch onto the skin daily. Remove & Discard patch within 12 hours or as directed by MD 09/01/16   Doristine Devoid, PA-C  methocarbamol (ROBAXIN) 500 MG tablet Take 1 tablet (500 mg total) by mouth 2 (two) times daily. 09/01/16   Doristine Devoid, PA-C  naproxen (NAPROSYN) 375 MG tablet Take 1 tablet (375 mg total) by mouth 2 (two) times daily. 09/01/16   Doristine Devoid, PA-C    Family History Family History  Problem Relation Age of Onset  . Cancer Maternal Grandmother     breast  . AAA (abdominal aortic aneurysm) Maternal Grandmother   . Head & neck cancer Paternal Grandmother     Back cancer  . Cirrhosis Maternal Uncle   . Colon cancer Neg Hx   . Esophageal cancer Neg Hx   . Rectal cancer Neg Hx   . Stomach cancer Neg Hx     Social History Social History  Substance Use Topics  . Smoking status: Current Every Day Smoker    Types: Cigarettes    Last attempt to quit: 09/06/2015  . Smokeless tobacco: Never Used  . Alcohol use Yes  Comment: occasionally     Allergies   Patient has no known allergies.   Review of Systems Review of Systems  Constitutional: Positive for chills and fever (subjective).  Respiratory: Positive for cough and shortness of breath.   Cardiovascular: Positive for chest pain (chronic).  Gastrointestinal: Positive for abdominal pain (chronic). Negative for blood in stool, diarrhea, nausea and vomiting.  Genitourinary: Negative for dysuria.  All other systems reviewed and are negative.    Physical Exam Updated Vital Signs BP 132/70   Pulse 77   Temp (S) 100.2 F (37.9 C) (Oral)   Resp 17   Ht 4\' 11"  (1.499 m)   Wt 68 kg   LMP 10/02/2016   SpO2 100%   BMI 30.30 kg/m   Physical Exam  Constitutional: She appears well-developed and  well-nourished. No distress.  HENT:  Head: Normocephalic and atraumatic.  Mouth/Throat: Mucous membranes are dry.  Eyes: Conjunctivae are normal.  Neck: Neck supple.  Cardiovascular: Normal rate and regular rhythm.   No murmur heard. Pulmonary/Chest: Effort normal and breath sounds normal. No respiratory distress. She has no wheezes. She has no rhonchi.  Abdominal: Soft. There is no tenderness.  Musculoskeletal: She exhibits no edema.       Right lower leg: She exhibits no swelling and no edema.       Left lower leg: She exhibits no swelling and no edema.  Neurological: She is alert.  Skin: Skin is warm and dry.  Psychiatric: She has a normal mood and affect.  Nursing note and vitals reviewed.  ED Treatments / Results  Labs (all labs ordered are listed, but only abnormal results are displayed) Labs Reviewed  COMPREHENSIVE METABOLIC PANEL - Abnormal; Notable for the following:       Result Value   ALT 12 (*)    All other components within normal limits  URINALYSIS, ROUTINE W REFLEX MICROSCOPIC - Abnormal; Notable for the following:    Ketones, ur 5 (*)    Leukocytes, UA TRACE (*)    Bacteria, UA RARE (*)    Squamous Epithelial / LPF 0-5 (*)    All other components within normal limits  CBC WITH DIFFERENTIAL/PLATELET  I-STAT CG4 LACTIC ACID, ED    EKG  EKG Interpretation None       Radiology Dg Chest 2 View  Result Date: 10/11/2016 CLINICAL DATA:  Shortness of breath and chest pain EXAM: CHEST  2 VIEW COMPARISON:  September 01, 2016 FINDINGS: There is a 1 cm nodular opacity either in or overlying the periphery of the left upper lobe near the apex seen only on the frontal view. This finding was not present on recent prior chest radiograph. Lungs elsewhere clear. Heart size and pulmonary vascular normal. No adenopathy. No pneumothorax. No bone lesions. IMPRESSION: Opacity in the periphery of the left upper lobe near the apex measuring 1 cm, not present on recent prior study.  Advise apical lordotic chest radiograph to further assess. Lungs elsewhere clear. Cardiac silhouette within normal limits. Electronically Signed   By: Lowella Grip III M.D.   On: 10/11/2016 14:30    Procedures Procedures (including critical care time)  Medications Ordered in ED Medications - No data to display   Initial Impression / Assessment and Plan / ED Course  I have reviewed the triage vital signs and the nursing notes.  Pertinent labs & imaging results that were available during my care of the patient were reviewed by me and considered in my medical decision making (see chart  for details).  Clinical Course as of Oct 12 44  Tue Oct 11, 2016  1700 Bilirubin Urine: NEGATIVE [SO]    Clinical Course User Index [SO] Maryan Puls, MD    Patient is well-appearing here. No acute distress. Vital signs normal and stable. Chest x-ray with evidence of pneumonia. We'll start the patient on appropriate antibiotics. She has normal vital signs and is ambulating throughout the emergency department without issue. Appropriate for outpatient management. EKG without ischemic changes or interval abnormalities. Description of symptoms not consistent with acute pathology such as ACS, dissection, embolism. Labs otherwise are reassuring. No lactic acidosis. We'll have the patient follow up with her primary care doctor in the next few days for reevaluation.  Final Clinical Impressions(s) / ED Diagnoses   Final diagnoses:  Community acquired pneumonia of left upper lobe of lung ([place])    New Prescriptions Discharge Medication List as of 10/11/2016  5:16 PM    START taking these medications   Details  azithromycin (ZITHROMAX Z-PAK) 250 MG tablet Take 500 mg day 1. Take 250 mg day 2-5., Print         Maryan Puls, MD 10/12/16 2224    Dorie Rank, MD 10/14/16 [phone]

## 2016-10-11 NOTE — ED Notes (Signed)
Papers and medications reviewed with patient and she verbalizes understanding

## 2016-10-11 NOTE — ED Triage Notes (Signed)
Pt c/o chest pain, abd pain as ongoing issues-- but started feeling worse last night-- has been running a fever-- took excedrin this am, Ibuprofen last night for fever.

## 2016-10-14 ENCOUNTER — Encounter (HOSPITAL_COMMUNITY): Payer: Self-pay | Admitting: Emergency Medicine

## 2016-10-14 ENCOUNTER — Emergency Department (HOSPITAL_COMMUNITY)
Admission: EM | Admit: 2016-10-14 | Discharge: 2016-10-14 | Disposition: A | Discharge status: Home / Self Care | Payer: Self-pay | Attending: Emergency Medicine | Admitting: Emergency Medicine

## 2016-10-14 DIAGNOSIS — Z79899 Other long term (current) drug therapy: Secondary | ICD-10-CM | POA: Insufficient documentation

## 2016-10-14 DIAGNOSIS — F17210 Nicotine dependence, cigarettes, uncomplicated: Secondary | ICD-10-CM | POA: Insufficient documentation

## 2016-10-14 DIAGNOSIS — L247 Irritant contact dermatitis due to plants, except food: Secondary | ICD-10-CM | POA: Insufficient documentation

## 2016-10-14 MED ORDER — DIPHENHYDRAMINE HCL 25 MG PO TABS: 25.0000 mg | ORAL_TABLET | Freq: Four times a day (QID) | ORAL | 0 refills | Status: DC

## 2016-10-14 MED ORDER — HYDROCORTISONE 2.5 % EX LOTN: TOPICAL_LOTION | Freq: Two times a day (BID) | CUTANEOUS | 0 refills | Status: DC

## 2016-10-14 NOTE — ED Triage Notes (Signed)
Pt reports itchy, red rash to both arms, chest and back.

## 2016-10-14 NOTE — ED Notes (Signed)
Pt is in stable condition upon d/c and ambulates from ED. 

## 2016-10-14 NOTE — Discharge Instructions (Signed)
Medications: Hydrocortisone, Benadryl  Treatment: Apply hydrocortisone lotion twice daily. Take Benadryl every 6 hours as needed for itching. You can also find an over-the-counter medication called Tecnu that is very helpful for poison ivy. I recommended washing your dog as well as her sheets to prevent any further spread of the poison ivy oils causing irritation to your skin.  Follow-up: Please return to emergency department if you develop any new or worsening symptoms.

## 2016-10-14 NOTE — ED Provider Notes (Signed)
[facility] DEPT Provider Note   CSN: [id number] Arrival date & time: 10/14/16  6283  By signing my name below, I, Evelene Croon, attest that this documentation has been prepared under the direction and in the presence of Eliezer Mccoy, PA-C. Electronically Signed: Evelene Croon, Scribe. 10/14/2016. 9:26 AM.  History   Chief Complaint Chief Complaint  Patient presents with  . Rash    The history is provided by the patient. No language interpreter was used.    HPI Comments:  Natalie Thornton is a 31 y.o. female who presents to the Emergency Department complaining of  a rash to her R wrist and left forearm x 1 week. She states it has worsened in the last 2 days; notes a few spots on her left side of upper chest. The rash is occasionally itchy and more so at night. She has applied hydrocortisone cream without relief. She has not recently spent time in the woods or in a new living environment. No rash noted to her BLE. No one at home with similar rash. Patient does have a dog that goes outside. No new soaps or detergents. Pt has no other acute complaints or associated symptoms at this time.    Past Medical History:  Diagnosis Date  . Anxiety   . Arthritis   . DDD (degenerative disc disease)   . Degenerative disc disease, cervical   . Disc disease, degenerative, cervical   . Pilonidal cyst     Patient Active Problem List   Diagnosis Date Noted  . Pilonidal sinus 07/05/2011    Past Surgical History:  Procedure Laterality Date  . COLONOSCOPY    . NO PAST SURGERIES      OB History    No data available       Home Medications    Prior to Admission medications   Medication Sig Start Date End Date Taking? Authorizing Provider  azithromycin (ZITHROMAX Z-PAK) 250 MG tablet Take 500 mg day 1. Take 250 mg day 2-5. 10/11/16   Maryan Puls, MD  bismuth-metronidazole-tetracycline [facility]) [phone] MG capsule Take 3 capsules by mouth 4 (four) times daily -  before meals and  at bedtime.    [provider]  dicyclomine (BENTYL) 20 MG tablet Take 1 by mouth every 4-6 hours as needed for cramping 08/24/16   Irene Shipper, MD  diphenhydrAMINE (BENADRYL) 25 MG tablet Take 1 tablet (25 mg total) by mouth every 6 (six) hours. 10/14/16   Berkeley Veldman, Bea Graff, PA-C  hydrocortisone 2.5 % lotion Apply topically 2 (two) times daily. 10/14/16   Nafeesah Lapaglia, Bea Graff, PA-C  lidocaine (LIDODERM) 5 % Place 1 patch onto the skin daily. Remove & Discard patch within 12 hours or as directed by MD 09/01/16   Doristine Devoid, PA-C  methocarbamol (ROBAXIN) 500 MG tablet Take 1 tablet (500 mg total) by mouth 2 (two) times daily. 09/01/16   Doristine Devoid, PA-C  naproxen (NAPROSYN) 375 MG tablet Take 1 tablet (375 mg total) by mouth 2 (two) times daily. 09/01/16   Doristine Devoid, PA-C    Family History Family History  Problem Relation Age of Onset  . Cancer Maternal Grandmother        breast  . AAA (abdominal aortic aneurysm) Maternal Grandmother   . Head & neck cancer Paternal Grandmother        Back cancer  . Cirrhosis Maternal Uncle   . Colon cancer Neg Hx   . Esophageal cancer Neg Hx   . Rectal  cancer Neg Hx   . Stomach cancer Neg Hx     Social History Social History  Substance Use Topics  . Smoking status: Current Every Day Smoker    Types: Cigarettes    Last attempt to quit: 09/06/2015  . Smokeless tobacco: Never Used  . Alcohol use Yes     Comment: occasionally     Allergies   Patient has no known allergies.   Review of Systems Review of Systems  Constitutional: Negative for chills and fever.  Skin: Positive for rash.     Physical Exam Updated Vital Signs BP 129/78   Pulse 82   Temp 98.3 F (36.8 C)   Resp 12   LMP 10/02/2016   SpO2 100%   Physical Exam  Constitutional: She is oriented to person, place, and time. She appears well-developed and well-nourished. No distress.  HENT:  Head: Normocephalic and atraumatic.  Mouth/Throat:  Oropharynx is clear and moist. No oropharyngeal exudate.  Eyes: Conjunctivae are normal. Pupils are equal, round, and reactive to light. Right eye exhibits no discharge. Left eye exhibits no discharge. No scleral icterus.  Neck: Normal range of motion. Neck supple. No thyromegaly present.  Cardiovascular: Normal rate, regular rhythm, normal heart sounds and intact distal pulses.  Exam reveals no gallop and no friction rub.   No murmur heard. Pulmonary/Chest: Effort normal and breath sounds normal. No stridor. No respiratory distress. She has no wheezes. She has no rales.  Abdominal: Soft. Bowel sounds are normal. She exhibits no distension. There is no tenderness. There is no rebound and no guarding.  Musculoskeletal: She exhibits no edema.  Lymphadenopathy:    She has no cervical adenopathy.  Neurological: She is alert and oriented to person, place, and time. Coordination normal.  Skin: Skin is warm and dry. Rash noted. She is not diaphoretic. No pallor.  Vesicular, linear rash to right anterior wrist, with a few vesicles on the left forearm and chest; spares palms and soles, no tracking in the webspaces; nontender  Psychiatric: She has a normal mood and affect.  Nursing note and vitals reviewed.        ED Treatments / Results  DIAGNOSTIC STUDIES:  Oxygen Saturation is 100% on RA, normal by my interpretation.    COORDINATION OF CARE:  9:246 AM Discussed treatment plan with pt at bedside and pt agreed to plan.  Labs (all labs ordered are listed, but only abnormal results are displayed) Labs Reviewed - No data to display  EKG  EKG Interpretation None       Radiology No results found.  Procedures Procedures (including critical care time)  Medications Ordered in ED Medications - No data to display   Initial Impression / Assessment and Plan / ED Course  I have reviewed the triage vital signs and the nursing notes.  Pertinent labs & imaging results that were  available during my care of the patient were reviewed by me and considered in my medical decision making (see chart for details).       Patient with probable poison ivy contact dermatitis considering patient has a dog that goes outside and no other family members exposed. Less likely scabies. Will treat with Hydrocortisone, Benadryl, over-the-counter Tecnu.  No signs of secondary infection.  Return precautions discussed. Pt is safe for discharge at this time. I discussed patient case with Dr. Billy Fischer who guided the patient's management and agrees with plan.    Final Clinical Impressions(s) / ED Diagnoses   Final diagnoses:  Irritant contact  dermatitis due to plants, except food    New Prescriptions Discharge Medication List as of 10/14/2016  9:52 AM    START taking these medications   Details  diphenhydrAMINE (BENADRYL) 25 MG tablet Take 1 tablet (25 mg total) by mouth every 6 (six) hours., Starting Fri 10/14/2016, Print    hydrocortisone 2.5 % lotion Apply topically 2 (two) times daily., Starting Fri 10/14/2016, Print       I personally performed the services described in this documentation, which was scribed in my presence. The recorded information has been reviewed and is accurate.     Frederica Kuster, PA-C 10/14/16 1018    Gareth Morgan, MD 10/14/16 2249

## 2016-10-19 ENCOUNTER — Encounter: Payer: Self-pay | Admitting: Gynecology

## 2016-10-20 ENCOUNTER — Ambulatory Visit (INDEPENDENT_AMBULATORY_CARE_PROVIDER_SITE_OTHER): Payer: Self-pay | Admitting: Internal Medicine

## 2016-10-20 ENCOUNTER — Encounter: Payer: Self-pay | Admitting: Internal Medicine

## 2016-10-20 ENCOUNTER — Ambulatory Visit (INDEPENDENT_AMBULATORY_CARE_PROVIDER_SITE_OTHER)
Admission: RE | Admit: 2016-10-20 | Discharge: 2016-10-20 | Disposition: A | Discharge status: Home / Self Care | Payer: Self-pay | Source: Ambulatory Visit | Attending: Internal Medicine | Admitting: Internal Medicine

## 2016-10-20 VITALS — BP 132/86 | HR 92 | Ht 59.0 in | Wt 156.0 lb

## 2016-10-20 DIAGNOSIS — L259 Unspecified contact dermatitis, unspecified cause: Secondary | ICD-10-CM

## 2016-10-20 DIAGNOSIS — R911 Solitary pulmonary nodule: Secondary | ICD-10-CM

## 2016-10-20 DIAGNOSIS — R0600 Dyspnea, unspecified: Secondary | ICD-10-CM

## 2016-10-20 DIAGNOSIS — R0609 Other forms of dyspnea: Secondary | ICD-10-CM

## 2016-10-20 DIAGNOSIS — F17210 Nicotine dependence, cigarettes, uncomplicated: Secondary | ICD-10-CM

## 2016-10-20 MED ORDER — IOPAMIDOL (ISOVUE-370) INJECTION 76%
80.0000 mL | Freq: Once | INTRAVENOUS | Status: AC | PRN
  Administered 2016-10-20: 80 mL via INTRAVENOUS

## 2016-10-20 NOTE — Assessment & Plan Note (Addendum)
See cxr 10/11/16 > CT chest 10/20/2016 >>>   Discussed in detail all the  indications, usual  risks and alternatives  relative to the benefits with patient who agrees to proceed with w/u as outlined and f/u here prn   Total time devoted to counseling  > 50 % of initial 60 min office visit:  review case with pt/ discussion of options/alternatives/ personally creating written customized instructions  in presence of pt  then going over those specific  Instructions directly with the pt including how to use all of the meds but in particular covering each new medication in detail and the difference between the maintenance= "automatic" meds and the prns using an action plan format for the latter (If this problem/symptom => do that organization reading Left to right).  Please see AVS from this visit for a full list of these instructions which I personally wrote for this pt and  are unique to this visit.

## 2016-10-20 NOTE — Assessment & Plan Note (Addendum)
>   3 min discussion  Assoc with chronic cough noted and this may in fact be the cause of her cp (MSCP from coughing)   I reviewed the Fletcher curve with the patient that basically indicates  if you quit smoking when your best day FEV1 is still well preserved (as is clearly  the case here)  it is highly unlikely you will progress to severe disease and informed the patient there was  no medication on the market that has proven to alter the curve/ its downward trajectory  or the likelihood of progression of their disease(unlike other chronic medical conditions such as atheroclerosis where we do think we can change the natural hx with risk reducing meds)    Therefore stopping smoking and maintaining abstinence is the most important aspect of care, not choice of inhalers or for that matter, doctors.

## 2016-10-20 NOTE — Progress Notes (Signed)
Subjective:    Patient ID: Natalie Thornton, female    DOB: [birth year],     MRN: [id number]  HPI  29 yobf active smoker healthy and child and teenager with new ant cp and cough starting Feb 2018  Then acutely ill x 10/09/16 sob, , fever dx as CAP > cough and fever improved but sob only a little better with ? SPN L apexso self referred.  to pulmonary clinic 10/20/2016     10/20/2016 1st [facility] office visit/ Natalie Thornton   Chief Complaint  Patient presents with  . Pulmonary Consult    lymph nodes feels swollen in her neck, chest pain, had pneumonia and found a nodule on her left upper lob, SOB   cp R ant chest since feb 2018 rad to L ant chest x April worse supine  Worse with deep breath or cough   Which is worse at hs min mucoid mucus Sob = MMRC1 = can walk nl pace, flat grade, can't hurry or go uphills or steps s sob     No obvious day to day or daytime variability or assoc excess/ purulent sputum or mucus plugs or hemoptysis or cp or chest tightness, subjective wheeze or overt sinus or hb symptoms. No unusual exp hx or h/o childhood pna/ asthma or knowledge of premature birth.  Sleeping ok without nocturnal  or early am exacerbation  of respiratory  c/o's or need for noct saba. Also denies any obvious fluctuation of symptoms with weather or environmental changes or other aggravating or alleviating factors except as outlined above   Current Medications, Allergies, Complete Past Medical History, Past Surgical History, Family History, and Social History were reviewed in [employer] record.      Review of Systems  Constitutional: Positive for unexpected weight change. Negative for fever.  HENT: Negative for congestion, dental problem, ear pain, nosebleeds, postnasal drip, rhinorrhea, sinus pressure, sneezing, sore throat and trouble swallowing.   Eyes: Negative for redness and itching.  Respiratory: Positive for cough and shortness of breath. Negative for chest  tightness and wheezing.   Cardiovascular: Negative for palpitations and leg swelling.  Gastrointestinal: Negative for nausea and vomiting.  Genitourinary: Negative for dysuria.  Musculoskeletal: Negative for joint swelling.  Skin: Positive for rash.  Neurological: Positive for headaches.  Hematological: Does not bruise/bleed easily.  Psychiatric/Behavioral: Negative for dysphoric mood. The patient is not nervous/anxious.        Objective:   Physical Exam  amb bf nad  Wt Readings from Last 3 Encounters:  10/20/16 156 lb (70.8 kg)  10/11/16 150 lb (68 kg)  08/12/16 164 lb (74.4 kg)    Vital signs reviewed - Note on arrival 02 sats  99% on RA      HEENT: nl dentition, turbinates bilaterally, and oropharynx. Nl external ear canals without cough reflex   NECK :  without JVD/Nodes/TM/ nl carotid upstrokes bilaterally   LUNGS: no acc muscle use,  Nl contour chest which is clear to A and P bilaterally without cough on insp or exp maneuvers   CV:  RRR  no s3 or murmur or increase in P2, and no edema   ABD:  soft and nontender with nl inspiratory excursion in the supine position. No bruits or organomegaly appreciated, bowel sounds nl  MS:  Nl gait/ ext warm without deformities, calf tenderness, cyanosis or clubbing No obvious joint restrictions   SKIN: warm and dry with classic linear erythematous nodules typical for contact dermatitis  NEURO:  alert, approp, nl sensorium with  no motor or cerebellar deficits apparent.     I personally reviewed images and agree with radiology impression as follows:  CXR:   10/11/16 Opacity in the periphery of the left upper lobe near the apex measuring 1 cm, not present on recent prior study. Advise apical lordotic chest radiograph to further assess. Lungs elsewhere clear. Cardiac silhouette within normal limits.       Assessment & Plan:

## 2016-10-20 NOTE — Assessment & Plan Note (Signed)
Spirometry 10/20/2016  wnl with nl curvature  - CTa chest 10/20/2016 >>>   There is no explanation for her sob and since she's also had unplained cp with new ? Nodule on cxr reasonable to do CTa to complete the w/u but with nl spirometry there is no need for any rx other than clean air at this point

## 2016-10-20 NOTE — Patient Instructions (Addendum)
Please see patient coordinator before you leave today  to schedule CTa chest  And I will call you with results   The key is to stop smoking completely before smoking completely stops you!    No pulmonary follow up needed unless indicated by CT chest

## 2016-10-20 NOTE — Assessment & Plan Note (Signed)
Classic appearance where sratched R volar forearm > rec HC 1% cream otc/ f/u PCP prn

## 2016-10-21 NOTE — Progress Notes (Signed)
Spoke with pt and notified of results per Dr. Wert. Pt verbalized understanding and denied any questions. 

## 2016-10-31 ENCOUNTER — Encounter: Payer: Self-pay | Admitting: Physician Assistant

## 2016-10-31 ENCOUNTER — Encounter (HOSPITAL_COMMUNITY): Payer: Self-pay | Admitting: *Deleted

## 2016-10-31 ENCOUNTER — Emergency Department (HOSPITAL_COMMUNITY): Payer: Self-pay

## 2016-10-31 ENCOUNTER — Emergency Department (HOSPITAL_COMMUNITY)
Admission: EM | Admit: 2016-10-31 | Discharge: 2016-10-31 | Disposition: A | Discharge status: Home / Self Care | Payer: Self-pay | Attending: Emergency Medicine | Admitting: Emergency Medicine

## 2016-10-31 DIAGNOSIS — F17210 Nicotine dependence, cigarettes, uncomplicated: Secondary | ICD-10-CM | POA: Insufficient documentation

## 2016-10-31 DIAGNOSIS — R0602 Shortness of breath: Secondary | ICD-10-CM | POA: Insufficient documentation

## 2016-10-31 DIAGNOSIS — Z79899 Other long term (current) drug therapy: Secondary | ICD-10-CM | POA: Insufficient documentation

## 2016-10-31 MED ORDER — IPRATROPIUM-ALBUTEROL 0.5-2.5 (3) MG/3ML IN SOLN
3.0000 mL | Freq: Once | RESPIRATORY_TRACT | Status: AC
  Administered 2016-10-31: 3 mL via RESPIRATORY_TRACT
  Filled 2016-10-31: qty 3

## 2016-10-31 MED ORDER — ALBUTEROL SULFATE HFA 108 (90 BASE) MCG/ACT IN AERS
1.0000 | INHALATION_SPRAY | Freq: Four times a day (QID) | RESPIRATORY_TRACT | Status: DC | PRN
  Administered 2016-10-31: 2 via RESPIRATORY_TRACT
  Filled 2016-10-31: qty 6.7

## 2016-10-31 MED ORDER — PREDNISONE 20 MG PO TABS
60.0000 mg | ORAL_TABLET | Freq: Once | ORAL | Status: AC
  Administered 2016-10-31: 60 mg via ORAL
  Filled 2016-10-31: qty 3

## 2016-10-31 MED ORDER — PREDNISONE 20 MG PO TABS: 40.0000 mg | ORAL_TABLET | Freq: Every day | ORAL | 0 refills | Status: DC

## 2016-10-31 NOTE — ED Provider Notes (Signed)
[facility] DEPT Provider Note   CSN: [id number] Arrival date & time: 10/31/16  1344     History   Chief Complaint Chief Complaint  Patient presents with  . Shortness of Breath    HPI Natalie Thornton is a 31 y.o. female who presents with SOB. PMH significant for current tobacco use and recent diagnosis of PNA. She was seen on 5/8 for chest pain and SOB. CXR showed opacity in the left upper lobe. She was sent home with a Z-pack and followed up with pulmonology. She still had SOB at that time which was attributed to resolving PNA. Spirometry and CT of chest were done which were normal. She states that she is constantly SOB but sometimes it is severe like last night when she felt like she was going to pass out. She feels like her neck is tight and swollen. She has associated cough which is mildly productive. No fever, URI symptoms, neck pain, chest pain, wheezing. She smokes ~ 3 black and milds a day. She finished her antibiotic.  HPI  Past Medical History:  Diagnosis Date  . Anxiety   . Arthritis   . DDD (degenerative disc disease)   . Degenerative disc disease, cervical   . Disc disease, degenerative, cervical   . Pilonidal cyst     Patient Active Problem List   Diagnosis Date Noted  . Dyspnea on exertion 10/20/2016  . Cigarette smoker 10/20/2016  . Solitary pulmonary nodule 10/20/2016  . Acute contact dermatitis 10/20/2016  . Pilonidal sinus 07/05/2011    Past Surgical History:  Procedure Laterality Date  . COLONOSCOPY    . NO PAST SURGERIES      OB History    No data available       Home Medications    Prior to Admission medications   Medication Sig Start Date End Date Taking? Authorizing Provider  bismuth-metronidazole-tetracycline [facility] [id number] MG capsule Take 3 capsules by mouth 4 (four) times daily -  before meals and at bedtime.   Yes [provider]  dicyclomine (BENTYL) 20 MG tablet Take 1 by mouth every 4-6 hours as needed for  cramping 08/24/16  Yes Irene Shipper, MD  diphenhydrAMINE (BENADRYL) 25 MG tablet Take 1 tablet (25 mg total) by mouth every 6 (six) hours. 10/14/16  Yes Law, Bea Graff, PA-C  hydrocortisone 2.5 % lotion Apply topically 2 (two) times daily. 10/14/16  Yes Law, Anon Raices, PA-C  azithromycin (ZITHROMAX Z-PAK) 250 MG tablet Take 500 mg day 1. Take 250 mg day 2-5. Patient not taking: Reported on 10/31/2016 10/11/16   Maryan Puls, MD    Family History Family History  Problem Relation Age of Onset  . Cancer Maternal Grandmother        breast  . AAA (abdominal aortic aneurysm) Maternal Grandmother   . Head & neck cancer Paternal Grandmother        Back cancer  . Cirrhosis Maternal Uncle   . Colon cancer Neg Hx   . Esophageal cancer Neg Hx   . Rectal cancer Neg Hx   . Stomach cancer Neg Hx     Social History Social History  Substance Use Topics  . Smoking status: Current Every Day Smoker    Packs/day: 1.00    Years: 10.00    Types: Cigars, Cigarettes  . Smokeless tobacco: Never Used  . Alcohol use Yes     Comment: occasionally     Allergies   Patient has no known allergies.   Review of  Systems Review of Systems  Constitutional: Negative for chills and fever.  HENT: Negative for congestion, ear pain, rhinorrhea and sore throat.   Respiratory: Positive for cough, chest tightness and shortness of breath. Negative for wheezing.   Cardiovascular: Negative for chest pain.  Neurological: Positive for light-headedness. Negative for syncope.  All other systems reviewed and are negative.    Physical Exam Updated Vital Signs BP 114/67   Pulse 60   Temp 98.7 F (37.1 C) (Oral)   Resp 18   Ht 4\' 11"  (1.499 m)   Wt 69.9 kg (154 lb)   LMP 10/15/2016   SpO2 97%   BMI 31.10 kg/m   Physical Exam  Constitutional: She is oriented to person, place, and time. She appears well-developed and well-nourished. No distress.  HENT:  Head: Normocephalic and atraumatic.  Right Ear:  Hearing, tympanic membrane, external ear and ear canal normal.  Left Ear: Hearing, tympanic membrane, external ear and ear canal normal.  Nose: Nose normal.  Mouth/Throat: Uvula is midline, oropharynx is clear and moist and mucous membranes are normal.  Eyes: Conjunctivae are normal. Pupils are equal, round, and reactive to light. Right eye exhibits no discharge. Left eye exhibits no discharge. No scleral icterus.  Neck: Normal range of motion.  Cardiovascular: Normal rate and regular rhythm.  Exam reveals no gallop and no friction rub.   No murmur heard. Pulmonary/Chest: Effort normal and breath sounds normal. No respiratory distress. She has no wheezes. She has no rales. She exhibits no tenderness.  Abdominal: She exhibits no distension.  Neurological: She is alert and oriented to person, place, and time.  Skin: Skin is warm and dry.  Psychiatric: She has a normal mood and affect. Her behavior is normal.  Nursing note and vitals reviewed.    ED Treatments / Results  Labs (all labs ordered are listed, but only abnormal results are displayed) Labs Reviewed - No data to display  EKG  EKG Interpretation None       Radiology Dg Chest 2 View  Result Date: 10/31/2016 CLINICAL DATA:  31 year old female with a history of pneumonia EXAM: CHEST  2 VIEW COMPARISON:  CT 10/20/2016, chest x-ray 10/11/2016, 07/14/2015 FINDINGS: The heart size and mediastinal contours are within normal limits. Both lungs are clear. The visualized skeletal structures are unremarkable. IMPRESSION: No radiographic evidence of acute cardiopulmonary disease. Electronically Signed   By: Corrie Mckusick D.O.   On: 10/31/2016 14:44    Procedures Procedures (including critical care time)  Medications Ordered in ED Medications  albuterol (PROVENTIL HFA;VENTOLIN HFA) 108 (90 Base) MCG/ACT inhaler 1-2 puff (2 puffs Inhalation Given 10/31/16 1840)  ipratropium-albuterol (DUONEB) 0.5-2.5 (3) MG/3ML nebulizer solution 3 mL  (3 mLs Nebulization Given 10/31/16 1750)  predniSONE (DELTASONE) tablet 60 mg (60 mg Oral Given 10/31/16 1748)     Initial Impression / Assessment and Plan / ED Course  I have reviewed the triage vital signs and the nursing notes.  Pertinent labs & imaging results that were available during my care of the patient were reviewed by me and considered in my medical decision making (see chart for details).  31 year old female with SOB. Unclear etiology. She appears comfortable. Lungs are CTA. She states she feels more difficulty breathing around her neck but she has no stridor and is talking in full sentences. Will treat symptomatically with duoneb and steroid.   On recheck, she reports some improvement. Will d/c with albuterol and steroid burst. Return precautions given.  Final Clinical Impressions(s) /  ED Diagnoses   Final diagnoses:  Shortness of breath    New Prescriptions New Prescriptions   No medications on file     Iris Pert 10/31/16 1901    Julianne Rice, MD 11/08/16 Laureen Abrahams

## 2016-10-31 NOTE — Discharge Instructions (Signed)
Use inhaler as needed for shortness of breath Take steroid for the next 4 days Return for worsening symptoms

## 2016-10-31 NOTE — ED Triage Notes (Addendum)
Pt reports SOB onset yesterday, pt reports being diagnosed with pneumonia 10/11/16, pt reports taking all of her antibiotics, pt reports shallow breathing  & inability to move air, pt speaks in full sentences, no use of accessory muscles in triage, NAD, A&O x4, reports CP only with cough

## 2016-10-31 NOTE — ED Notes (Signed)
Pt states "I just feel like I can't get a good breath" resp easy and unlabored.

## 2016-11-01 ENCOUNTER — Ambulatory Visit: Payer: Self-pay | Admitting: Physician Assistant

## 2016-11-10 ENCOUNTER — Institutional Professional Consult (permissible substitution): Payer: Self-pay | Admitting: Pulmonary Disease

## 2016-11-14 ENCOUNTER — Emergency Department (HOSPITAL_COMMUNITY)
Admission: EM | Admit: 2016-11-14 | Discharge: 2016-11-14 | Disposition: A | Discharge status: Home / Self Care | Payer: Self-pay | Attending: Emergency Medicine | Admitting: Emergency Medicine

## 2016-11-14 ENCOUNTER — Emergency Department (HOSPITAL_COMMUNITY): Payer: Self-pay

## 2016-11-14 ENCOUNTER — Encounter (HOSPITAL_COMMUNITY): Payer: Self-pay | Admitting: *Deleted

## 2016-11-14 DIAGNOSIS — S39012A Strain of muscle, fascia and tendon of lower back, initial encounter: Secondary | ICD-10-CM | POA: Insufficient documentation

## 2016-11-14 DIAGNOSIS — Y9289 Other specified places as the place of occurrence of the external cause: Secondary | ICD-10-CM | POA: Insufficient documentation

## 2016-11-14 DIAGNOSIS — F17210 Nicotine dependence, cigarettes, uncomplicated: Secondary | ICD-10-CM | POA: Insufficient documentation

## 2016-11-14 DIAGNOSIS — Y990 Civilian activity done for income or pay: Secondary | ICD-10-CM | POA: Insufficient documentation

## 2016-11-14 DIAGNOSIS — R52 Pain, unspecified: Secondary | ICD-10-CM

## 2016-11-14 DIAGNOSIS — Y9389 Activity, other specified: Secondary | ICD-10-CM | POA: Insufficient documentation

## 2016-11-14 DIAGNOSIS — W19XXXA Unspecified fall, initial encounter: Secondary | ICD-10-CM | POA: Insufficient documentation

## 2016-11-14 LAB — RAPID STREP SCREEN (MED CTR MEBANE ONLY): Streptococcus, Group A Screen (Direct): NEGATIVE

## 2016-11-14 MED ORDER — NAPROXEN 250 MG PO TABS
500.0000 mg | ORAL_TABLET | Freq: Once | ORAL | Status: AC
  Administered 2016-11-14: 500 mg via ORAL
  Filled 2016-11-14: qty 2

## 2016-11-14 MED ORDER — CYCLOBENZAPRINE HCL 10 MG PO TABS: 10.0000 mg | ORAL_TABLET | Freq: Two times a day (BID) | ORAL | 0 refills | Status: DC | PRN

## 2016-11-14 MED ORDER — ETODOLAC 300 MG PO CAPS: 300.0000 mg | ORAL_CAPSULE | Freq: Three times a day (TID) | ORAL | 0 refills | Status: DC

## 2016-11-14 NOTE — Discharge Instructions (Signed)
Consider seeing a chiropractor or physical therapist, take the medications as needed for pain   [facility]  Physical therapist in [place], [place] Address: [address], [place], [place] [address] Hours: Open  Closes 6:30PM Phone: [phone]

## 2016-11-14 NOTE — ED Notes (Signed)
Called in lobby  no answer 

## 2016-11-14 NOTE — ED Notes (Signed)
Patient transported to X-ray 

## 2016-11-14 NOTE — ED Notes (Signed)
Patient able to ambulate independently  

## 2016-11-14 NOTE — ED Triage Notes (Signed)
Pt is here with mid lower back pain and states it has been going on for years.  No incontinence.  Pt states pain with movement and reports dry throat. PT reports fever.  Pt reports sob and cough, not consistent. Pt states she was diagnosed with pneumonia and then came again and go inhaler

## 2016-11-14 NOTE — ED Provider Notes (Signed)
[facility] DEPT Provider Note   CSN: [id number] Arrival date & time: 11/14/16  1015  By signing my name below, I, Collene Leyden, attest that this documentation has been prepared under the direction and in the presence of Dorie Rank, MD. Electronically Signed: Collene Leyden, Scribe. 11/14/16. 1:27 PM.  History   Chief Complaint Chief Complaint  Patient presents with  . Shortness of Breath  . Back Pain  . Fever    HPI Comments: Natalie Thornton is a 31 y.o. female with a history of degenerative disc disease, who presents to the Emergency Department complaining of choric back pain, which acutely worsened several days ago. Patient states she has been seen several times for her back pain, but the pain is unbearable today. Patient reports a recent fall at work, but denies any injury or trauma. Patient reports associated radiation down to the buttock region. No modifying factors indicated. Patient states her pain is worse with change of positions and movement. Patient is ambulatory in the emergency department. Patient denies any fever, chills, urinary/bladder incontinence, dysuria, flank pain, trouble urinating, hematuria, numbness, or weakness.   According to the triage note the patient was complaining of a persistent sore throat, fever, and shortness of breath. There was no mention of a sore throat, fever, or shortness of breath during evaluation in the room.   The history is provided by the patient. No language interpreter was used.    Past Medical History:  Diagnosis Date  . Anxiety   . Arthritis   . DDD (degenerative disc disease)   . Degenerative disc disease, cervical   . Disc disease, degenerative, cervical   . Pilonidal cyst     Patient Active Problem List   Diagnosis Date Noted  . Dyspnea on exertion 10/20/2016  . Cigarette smoker 10/20/2016  . Solitary pulmonary nodule 10/20/2016  . Acute contact dermatitis 10/20/2016  . Pilonidal sinus 07/05/2011    Past Surgical  History:  Procedure Laterality Date  . COLONOSCOPY    . NO PAST SURGERIES      OB History    No data available       Home Medications    Prior to Admission medications   Medication Sig Start Date End Date Taking? Authorizing Provider  azithromycin (ZITHROMAX Z-PAK) 250 MG tablet Take 500 mg day 1. Take 250 mg day 2-5. Patient not taking: Reported on 10/31/2016 10/11/16   Maryan Puls, MD  bismuth-metronidazole-tetracycline [facility]) [phone] MG capsule Take 3 capsules by mouth 4 (four) times daily -  before meals and at bedtime.    [provider]  cyclobenzaprine (FLEXERIL) 10 MG tablet Take 1 tablet (10 mg total) by mouth 2 (two) times daily as needed for muscle spasms. 11/14/16   Dorie Rank, MD  dicyclomine (BENTYL) 20 MG tablet Take 1 by mouth every 4-6 hours as needed for cramping 08/24/16   Irene Shipper, MD  diphenhydrAMINE (BENADRYL) 25 MG tablet Take 1 tablet (25 mg total) by mouth every 6 (six) hours. 10/14/16   Law, Bea Graff, PA-C  etodolac (LODINE) 300 MG capsule Take 1 capsule (300 mg total) by mouth every 8 (eight) hours. 11/14/16   Dorie Rank, MD  hydrocortisone 2.5 % lotion Apply topically 2 (two) times daily. 10/14/16   Law, Bea Graff, PA-C  predniSONE (DELTASONE) 20 MG tablet Take 2 tablets (40 mg total) by mouth daily. 10/31/16   Recardo Evangelist, PA-C    Family History Family History  Problem Relation Age of Onset  .  Cancer Maternal Grandmother        breast  . AAA (abdominal aortic aneurysm) Maternal Grandmother   . Head & neck cancer Paternal Grandmother        Back cancer  . Cirrhosis Maternal Uncle   . Colon cancer Neg Hx   . Esophageal cancer Neg Hx   . Rectal cancer Neg Hx   . Stomach cancer Neg Hx     Social History Social History  Substance Use Topics  . Smoking status: Current Every Day Smoker    Packs/day: 1.00    Years: 10.00    Types: Cigars, Cigarettes  . Smokeless tobacco: Never Used  . Alcohol use Yes     Comment:  occasionally     Allergies   Patient has no known allergies.   Review of Systems Review of Systems  Constitutional: Negative for chills and fever.  Genitourinary: Negative for difficulty urinating, dysuria, flank pain, frequency, hematuria and vaginal discharge.  Musculoskeletal: Positive for back pain. Negative for gait problem.  All other systems reviewed and are negative.    Physical Exam Updated Vital Signs BP 132/68 (BP Location: Right Arm)   Pulse 76   Temp 98.3 F (36.8 C) (Oral)   Resp 16   LMP 10/15/2016   SpO2 100%   Physical Exam  Constitutional: She appears well-developed and well-nourished. No distress.  HENT:  Head: Normocephalic and atraumatic.  Right Ear: External ear normal.  Left Ear: External ear normal.  Mouth/Throat: Oropharynx is clear and moist. No oropharyngeal exudate.  Eyes: Conjunctivae are normal. Right eye exhibits no discharge. Left eye exhibits no discharge. No scleral icterus.  Neck: Neck supple. No tracheal deviation present.  Cardiovascular: Normal rate, regular rhythm and intact distal pulses.   Pulmonary/Chest: Effort normal and breath sounds normal. No stridor. No respiratory distress. She has no wheezes. She has no rales.  Abdominal: Soft. Bowel sounds are normal. She exhibits no distension. There is no tenderness. There is no rebound and no guarding.  Musculoskeletal: Normal range of motion. She exhibits tenderness. She exhibits no edema or deformity.  TTP to the paraspinal muscles to the lumbar region.   Neurological: She is alert. She has normal strength. No cranial nerve deficit (no facial droop, extraocular movements intact, no slurred speech) or sensory deficit. She exhibits normal muscle tone. She displays no seizure activity. Coordination normal.  Normal sensation bilateral lower extremities. 5/5 strength. Plantar flexion and dorsal extension normal bilaterally.   Skin: Skin is warm and dry. No rash noted.  Psychiatric: She has  a normal mood and affect.  Nursing note and vitals reviewed.    ED Treatments / Results  DIAGNOSTIC STUDIES: Oxygen Saturation is 99% on RA, normal by my interpretation.    COORDINATION OF CARE: 1:25 PM Discussed treatment plan with pt at bedside and pt agreed to plan, which includes imaging of the lower back.   Labs (all labs ordered are listed, but only abnormal results are displayed) Labs Reviewed  RAPID STREP SCREEN (NOT AT [facility])  CULTURE, GROUP A STREP [facility])    EKG  EKG Interpretation  Date/Time:  Monday November 14 2016 10:37:54 EDT Ventricular Rate:  84 PR Interval:  154 QRS Duration: 76 QT Interval:  378 QTC Calculation: 446 R Axis:   46 Text Interpretation:  Normal sinus rhythm Septal infarct , age undetermined Abnormal ECG No significant change since last tracing Confirmed by Dorie Rank [phone]) on 11/14/2016 12:19:44 PM       Radiology Dg Chest  2 View  Result Date: 11/14/2016 CLINICAL DATA:  Patient with sore throat and chest pain. EXAM: CHEST  2 VIEW COMPARISON:  Chest radiograph 10/31/2016. FINDINGS: Normal cardiac and mediastinal contours. No consolidative pulmonary opacities. No pleural effusion or pneumothorax. Regional skeleton is unremarkable. IMPRESSION: No acute cardiopulmonary process. Electronically Signed   By: Lovey Newcomer M.D.   On: 11/14/2016 11:15   Dg Thoracic Spine W/swimmers  Result Date: 11/14/2016 CLINICAL DATA:  Pain between shoulder blades. EXAM: THORACIC SPINE - 3 VIEWS COMPARISON:  CXR 11/14/2016, chest CT 10/20/2016. FINDINGS: There is no evidence of thoracic spine fracture. No primary bone lesions. Alignment is normal. Thirteen rib-bearing thoracic vertebrae, the lowest will be considered L1. There is mild disc space narrowing from T3 through T8. IMPRESSION: 1. Mild disc space narrowing T3 through T8. 2. Thirteen rib-bearing vertebrae, the lowest labeled L1. 3. No acute fracture nor bone destruction. Electronically Signed   By: Ashley Royalty M.D.    On: 11/14/2016 15:10   Dg Lumbar Spine Complete  Result Date: 11/14/2016 CLINICAL DATA:  Worsening back pain without known injury. EXAM: LUMBAR SPINE - COMPLETE 4+ VIEW COMPARISON:  MRI of the lumbar spine report from 12/09/2013 FINDINGS: Transitional lumbosacral anatomy. Using previous reported labeling, the last fully opened disc space is labeled L5-S1 with L1 demonstrating small ribs bilaterally. No lumbar vertebral fracture, spondylolysis nor spondylolisthesis. Slight disc space narrowing at L5-S1 with discogenic sclerosis of the L5 inferior endplate. IMPRESSION: 1. Lumbosacral transitional vertebra with four non rib-bearing lumbar vertebrae, the lowest square vertebral body labeled L5. 2. Degenerative disc space narrowing and discogenic sclerosis across the L5-S1 interspace. 3. No acute fracture nor bone destruction. No pars defects identified. Electronically Signed   By: Ashley Royalty M.D.   On: 11/14/2016 15:03    Procedures Procedures (including critical care time)  Medications Ordered in ED Medications  naproxen (NAPROSYN) tablet 500 mg (500 mg Oral Given 11/14/16 1547)     Initial Impression / Assessment and Plan / ED Course  I have reviewed the triage vital signs and the nursing notes.  Pertinent labs & imaging results that were available during my care of the patient were reviewed by me and considered in my medical decision making (see chart for details).   Pt presented to the ED with a primary complaint of low back pain.  Incidentally mentioned sore throat cough at triage but did not mention that when I asked her what brought her into the ED.  No signs of acute infection.  Breathing easily.  Doubt serious etiology.  Back pain appears to be musculoskeletal.  No neuro findings.  Doubt sciatica. Discussed outpatient follow up, consider PT or chiropractor.   Final Clinical Impressions(s) / ED Diagnoses   Final diagnoses:  Strain of lumbar region, initial encounter    New  Prescriptions Discharge Medication List as of 11/14/2016  3:31 PM    START taking these medications   Details  cyclobenzaprine (FLEXERIL) 10 MG tablet Take 1 tablet (10 mg total) by mouth 2 (two) times daily as needed for muscle spasms., Starting Mon 11/14/2016, Print    etodolac (LODINE) 300 MG capsule Take 1 capsule (300 mg total) by mouth every 8 (eight) hours., Starting Mon 11/14/2016, Print       I personally performed the services described in this documentation, which was scribed in my presence.  The recorded information has been reviewed and is accurate.    Dorie Rank, MD 11/15/16 1520

## 2016-11-16 LAB — CULTURE, GROUP A STREP (THRC)

## 2016-12-01 ENCOUNTER — Encounter (HOSPITAL_COMMUNITY): Payer: Self-pay

## 2016-12-01 ENCOUNTER — Emergency Department (HOSPITAL_COMMUNITY): Payer: Self-pay

## 2016-12-01 ENCOUNTER — Emergency Department (HOSPITAL_COMMUNITY)
Admission: EM | Admit: 2016-12-01 | Discharge: 2016-12-01 | Disposition: A | Discharge status: Home / Self Care | Payer: Self-pay | Attending: Emergency Medicine | Admitting: Emergency Medicine

## 2016-12-01 DIAGNOSIS — F419 Anxiety disorder, unspecified: Secondary | ICD-10-CM | POA: Insufficient documentation

## 2016-12-01 DIAGNOSIS — F17210 Nicotine dependence, cigarettes, uncomplicated: Secondary | ICD-10-CM | POA: Insufficient documentation

## 2016-12-01 DIAGNOSIS — Z79899 Other long term (current) drug therapy: Secondary | ICD-10-CM | POA: Insufficient documentation

## 2016-12-01 DIAGNOSIS — R0789 Other chest pain: Secondary | ICD-10-CM | POA: Insufficient documentation

## 2016-12-01 LAB — BASIC METABOLIC PANEL
ANION GAP: 6 (ref 5–15)
BUN: 8 mg/dL (ref 6–20)
CALCIUM: 9.4 mg/dL (ref 8.9–10.3)
CHLORIDE: 105 mmol/L (ref 101–111)
CO2: 26 mmol/L (ref 22–32)
Creatinine, Ser: 0.97 mg/dL (ref 0.44–1.00)
GFR calc non Af Amer: >60 mL/min (ref >60)
GLUCOSE: 117 mg/dL — AB (ref 65–99)
Potassium: 3.8 mmol/L (ref 3.5–5.1)
Sodium: 137 mmol/L (ref 135–145)

## 2016-12-01 LAB — CBC WITH DIFFERENTIAL/PLATELET
Basophils Absolute: 0 10*3/uL (ref 0.0–0.1)
Basophils Relative: 0 %
EOS ABS: 0.1 10*3/uL (ref 0.0–0.7)
Eosinophils Relative: 1 %
HEMATOCRIT: 40.9 % (ref 36.0–46.0)
HEMOGLOBIN: 13.5 g/dL (ref 12.0–15.0)
LYMPHS ABS: 2.7 10*3/uL (ref 0.7–4.0)
LYMPHS PCT: 36 %
MCH: 29.4 pg (ref 26.0–34.0)
MCHC: 33 g/dL (ref 30.0–36.0)
MCV: 89.1 fL (ref 78.0–100.0)
MONOS PCT: 6 %
Monocytes Absolute: 0.5 10*3/uL (ref 0.1–1.0)
NEUTROS PCT: 57 %
Neutro Abs: 4.3 10*3/uL (ref 1.7–7.7)
Platelets: 258 10*3/uL (ref 150–400)
RBC: 4.59 MIL/uL (ref 3.87–5.11)
RDW: 13 % (ref 11.5–15.5)
WBC: 7.6 10*3/uL (ref 4.0–10.5)

## 2016-12-01 LAB — TROPONIN I: Troponin I: <0.03 ng/mL (ref <0.03)

## 2016-12-01 NOTE — ED Notes (Signed)
Patient walked out before lab results were back. PA aware.

## 2016-12-01 NOTE — ED Provider Notes (Signed)
[facility] DEPT Provider Note   CSN: [id number] Arrival date & time: 12/01/16  1427  By signing my name below, I, Marcello Moores, attest that this documentation has been prepared under the direction and in the presence of Cincere Zorn, PA-C. Electronically Signed: Marcello Moores, ED Scribe. 12/01/16. 4:27 PM.  History   Chief Complaint Chief Complaint  Patient presents with  . chest wall pain/cough   The history is provided by the patient. No language interpreter was used.   HPI Comments: Natalie Thornton is a 31 y.o. female who presents to the Emergency Department complaining of central partially productive chest pain with associated cough that began worsened this morning. The pt also has noted nausea, vomiting, constipation, abdominal pain and temperature of Tmax 99.4 (that began a week ago). Inspiration, movement, and cough exacerbates her pain. She states that she was given an inhaler when she was last seen in the ED. She also notes left "achey" armpit and flank pain that radiates down her left arm that is unlike recent symptoms, and worsened with palpation. She has tried ibuprofen and muscle relaxers that provide inadequate relief. No positive sick contacts. The pt denies a h/x of DVT and PE, history of cancer. She denies recent surgeries or abdominal surgeries. The pt is on the Depo shot for birth control. She denies travel for long periods of time. She also denies appetite change, diaphoresis, visual disturbance, dysuria, hematuria, and vaginal discharge.  Past Medical History:  Diagnosis Date  . Anxiety   . Arthritis   . DDD (degenerative disc disease)   . Degenerative disc disease, cervical   . Disc disease, degenerative, cervical   . Pilonidal cyst     Patient Active Problem List   Diagnosis Date Noted  . Dyspnea on exertion 10/20/2016  . Cigarette smoker 10/20/2016  . Solitary pulmonary nodule 10/20/2016  . Acute contact dermatitis 10/20/2016  . Pilonidal sinus  07/05/2011    Past Surgical History:  Procedure Laterality Date  . COLONOSCOPY    . NO PAST SURGERIES      OB History    No data available       Home Medications    Prior to Admission medications   Medication Sig Start Date End Date Taking? Authorizing Provider  azithromycin (ZITHROMAX Z-PAK) 250 MG tablet Take 500 mg day 1. Take 250 mg day 2-5. Patient not taking: Reported on 10/31/2016 10/11/16   Maryan Puls, MD  bismuth-metronidazole-tetracycline [facility]) [phone] MG capsule Take 3 capsules by mouth 4 (four) times daily -  before meals and at bedtime.    [provider]  cyclobenzaprine (FLEXERIL) 10 MG tablet Take 1 tablet (10 mg total) by mouth 2 (two) times daily as needed for muscle spasms. 11/14/16   Dorie Rank, MD  dicyclomine (BENTYL) 20 MG tablet Take 1 by mouth every 4-6 hours as needed for cramping 08/24/16   Irene Shipper, MD  diphenhydrAMINE (BENADRYL) 25 MG tablet Take 1 tablet (25 mg total) by mouth every 6 (six) hours. 10/14/16   Law, Bea Graff, PA-C  etodolac (LODINE) 300 MG capsule Take 1 capsule (300 mg total) by mouth every 8 (eight) hours. 11/14/16   Dorie Rank, MD  hydrocortisone 2.5 % lotion Apply topically 2 (two) times daily. 10/14/16   Law, Bea Graff, PA-C  predniSONE (DELTASONE) 20 MG tablet Take 2 tablets (40 mg total) by mouth daily. 10/31/16   Recardo Evangelist, PA-C    Family History Family History  Problem Relation Age of Onset  .  Cancer Maternal Grandmother        breast  . AAA (abdominal aortic aneurysm) Maternal Grandmother   . Head & neck cancer Paternal Grandmother        Back cancer  . Cirrhosis Maternal Uncle   . Colon cancer Neg Hx   . Esophageal cancer Neg Hx   . Rectal cancer Neg Hx   . Stomach cancer Neg Hx     Social History Social History  Substance Use Topics  . Smoking status: Current Every Day Smoker    Packs/day: 1.00    Years: 10.00    Types: Cigars, Cigarettes  . Smokeless tobacco: Never Used  .  Alcohol use Yes     Comment: occasionally     Allergies   Patient has no known allergies.   Review of Systems Review of Systems  Constitutional: Positive for fever (low-grade). Negative for appetite change and diaphoresis.  Eyes: Negative for visual disturbance.  Gastrointestinal: Positive for abdominal pain, constipation, nausea and vomiting.  Genitourinary: Negative for dysuria, hematuria and vaginal discharge.  Musculoskeletal: Positive for arthralgias and myalgias.  Neurological: Positive for headaches.  All other systems reviewed and are negative.    Physical Exam Updated Vital Signs BP 116/83   Pulse 78   Temp 99 F (37.2 C) (Oral)   Resp 18   SpO2 100%   Physical Exam  Constitutional: She appears well-developed and well-nourished. No distress.  HENT:  Head: Normocephalic and atraumatic.  Nose: Nose normal.  Eyes: Conjunctivae and EOM are normal. Left eye exhibits no discharge. No scleral icterus.  Neck: Normal range of motion. Neck supple.  Cardiovascular: Normal rate, regular rhythm, normal heart sounds and intact distal pulses.  Exam reveals no gallop and no friction rub.   No murmur heard. Pulses:      Dorsalis pedis pulses are 2+ on the right side, and 2+ on the left side.  2+ DP pulses.   Pulmonary/Chest: Effort normal and breath sounds normal. No respiratory distress. She has no wheezes. She has no rales.  Abdominal: Soft. Bowel sounds are normal. She exhibits no distension. There is no tenderness. There is no guarding.  Musculoskeletal: Normal range of motion. She exhibits no edema.  Tenderness below the left armpit and chest. No palpable abscess or area of induration. No temperature or color change noted.  No leg swelling. No calf tenderness. Negative Homan's sign. No temperature change. No color change.  Neurological: She is alert. She exhibits normal muscle tone. Coordination normal.  Skin: Skin is warm and dry. No rash noted.  Psychiatric: She has a  normal mood and affect.  Nursing note and vitals reviewed.    ED Treatments / Results   DIAGNOSTIC STUDIES: Oxygen Saturation is 100% on RA, normal by my interpretation.   COORDINATION OF CARE: 4:25 PM-Discussed next steps with pt. Pt verbalized understanding and is agreeable with the plan.   Labs (all labs ordered are listed, but only abnormal results are displayed) Labs Reviewed  BASIC METABOLIC PANEL - Abnormal; Notable for the following:       Result Value   Glucose, Bld 117 (*)    All other components within normal limits  TROPONIN I  CBC WITH DIFFERENTIAL/PLATELET    EKG  EKG Interpretation None       Radiology Dg Chest 2 View  Result Date: 12/01/2016 CLINICAL DATA:  Mid chest pain for 1 month, smoking history EXAM: CHEST  2 VIEW COMPARISON:  Chest x-ray of 11/14/2016 FINDINGS: No active infiltrate  or effusion is seen. Mediastinal and hilar contours are unremarkable. The heart is within normal limits in size. No bony abnormality is seen. IMPRESSION: No active cardiopulmonary disease. Electronically Signed   By: Ivar Drape M.D.   On: 12/01/2016 15:27    Procedures Procedures (including critical care time)  Medications Ordered in ED Medications - No data to display   Initial Impression / Assessment and Plan / ED Course  I have reviewed the triage vital signs and the nursing notes.  Pertinent labs & imaging results that were available during my care of the patient were reviewed by me and considered in my medical decision making (see chart for details).     Patient presents to ED for chest pain and cough that began 2 days ago. She reports increase in pain with movement and coughing. X-ray was obtained and was negative for active cardiopulmonary disease. Patient is afebrile at this time. Patient is on Depo but denies any use of estrogen OCPs. She denies any previous history of DVT or PE, shortness of breath, hemoptysis, history of cancer, history of recent  surgery, recent prolonged immobilization. She is satting at 100% on room air. She is not tachycardic or tachypnea. Her chest pain is reproducible with palpation of the left rib area and mid sternum. I have low suspicion for PE based on her physical exam findings and history. Although she is on birth control, it is a progesterone birth control, and not estrogen. EKG showed no evidence of ST elevation or other arrhythmia. CBC, BMP and troponin returned as unremarkable. Patient did leave AMA before lab testing returned and before I could speak with her  Final Clinical Impressions(s) / ED Diagnoses   Final diagnoses:  Chest wall pain    New Prescriptions Discharge Medication List as of 12/01/2016  6:34 PM     I personally performed the services described in this documentation, which was scribed in my presence. The recorded information has been reviewed and is accurate.     Delia Heady, PA-C 12/01/16 2056    Gareth Morgan, MD 12/02/16 1339

## 2016-12-01 NOTE — ED Triage Notes (Signed)
Patient complains of chest wall pain and cough x 2 days. Reports her pain is with inspiration and cough that she describes as partially productive. Low grade fever with same, NAD

## 2016-12-01 NOTE — ED Notes (Signed)
Patient to xray.

## 2016-12-05 ENCOUNTER — Emergency Department (HOSPITAL_COMMUNITY)
Admission: EM | Admit: 2016-12-05 | Discharge: 2016-12-05 | Disposition: A | Discharge status: Home / Self Care | Payer: Self-pay | Attending: Emergency Medicine | Admitting: Emergency Medicine

## 2016-12-05 ENCOUNTER — Encounter (HOSPITAL_COMMUNITY): Payer: Self-pay | Admitting: Emergency Medicine

## 2016-12-05 ENCOUNTER — Ambulatory Visit: Payer: Self-pay | Admitting: Physician Assistant

## 2016-12-05 DIAGNOSIS — F17210 Nicotine dependence, cigarettes, uncomplicated: Secondary | ICD-10-CM | POA: Insufficient documentation

## 2016-12-05 DIAGNOSIS — T148XXA Other injury of unspecified body region, initial encounter: Secondary | ICD-10-CM

## 2016-12-05 DIAGNOSIS — Y999 Unspecified external cause status: Secondary | ICD-10-CM | POA: Insufficient documentation

## 2016-12-05 DIAGNOSIS — M62830 Muscle spasm of back: Secondary | ICD-10-CM

## 2016-12-05 DIAGNOSIS — Y939 Activity, unspecified: Secondary | ICD-10-CM | POA: Insufficient documentation

## 2016-12-05 DIAGNOSIS — F17290 Nicotine dependence, other tobacco product, uncomplicated: Secondary | ICD-10-CM | POA: Insufficient documentation

## 2016-12-05 DIAGNOSIS — Z79899 Other long term (current) drug therapy: Secondary | ICD-10-CM | POA: Insufficient documentation

## 2016-12-05 DIAGNOSIS — Y929 Unspecified place or not applicable: Secondary | ICD-10-CM | POA: Insufficient documentation

## 2016-12-05 DIAGNOSIS — X509XXA Other and unspecified overexertion or strenuous movements or postures, initial encounter: Secondary | ICD-10-CM | POA: Insufficient documentation

## 2016-12-05 DIAGNOSIS — S39012A Strain of muscle, fascia and tendon of lower back, initial encounter: Secondary | ICD-10-CM | POA: Insufficient documentation

## 2016-12-05 MED ORDER — DICLOFENAC SODIUM 50 MG PO TBEC: 50.0000 mg | DELAYED_RELEASE_TABLET | Freq: Two times a day (BID) | ORAL | 0 refills | Status: DC

## 2016-12-05 MED ORDER — KETOROLAC TROMETHAMINE 60 MG/2ML IM SOLN
30.0000 mg | Freq: Once | INTRAMUSCULAR | Status: AC
  Administered 2016-12-05: 30 mg via INTRAMUSCULAR
  Filled 2016-12-05: qty 2

## 2016-12-05 MED ORDER — CYCLOBENZAPRINE HCL 10 MG PO TABS
10.0000 mg | ORAL_TABLET | Freq: Once | ORAL | Status: AC
  Administered 2016-12-05: 10 mg via ORAL
  Filled 2016-12-05: qty 1

## 2016-12-05 MED ORDER — METHOCARBAMOL 500 MG PO TABS: 500.0000 mg | ORAL_TABLET | Freq: Two times a day (BID) | ORAL | 0 refills | Status: DC

## 2016-12-05 NOTE — Progress Notes (Deleted)
Natalie Thornton is a 31 y.o. 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   Acute Concerns:   Chronic Issues:   Health Maintenance: Immunizations -- *** Colonoscopy -- *** Mammogram -- *** PAP -- *** Bone Density -- *** PSA -- *** Diet -- *** Caffeine intake -- *** Sleep habits -- *** Exercise -- *** Weight --    Mood -- ***  No flowsheet data found.  No flowsheet data found.   Other providers/specialists:   Past Medical History:  Diagnosis Date  . Anxiety   . Arthritis   . DDD (degenerative disc disease)   . Degenerative disc disease, cervical   . Disc disease, degenerative, cervical   . Pilonidal cyst      Social History   Social History  . Marital status: Married    Spouse name: N/A  . Number of children: 2  . Years of education: N/A   Occupational History  . Not on file.   Social History Main Topics  . Smoking status: Current Every Day Smoker    Packs/day: 1.00    Years: 10.00    Types: Cigars, Cigarettes  . Smokeless tobacco: Never Used  . Alcohol use Yes     Comment: occasionally  . Drug use: No  . Sexual activity: Not on file   Other Topics Concern  . Not on file   Social History Narrative  . No narrative on file    Past Surgical History:  Procedure Laterality Date  . COLONOSCOPY    . NO PAST SURGERIES      Family History  Problem Relation Age of Onset  . Cancer Maternal Grandmother        breast  . AAA (abdominal aortic aneurysm) Maternal Grandmother   . Head & neck cancer Paternal Grandmother        Back cancer  . Cirrhosis Maternal Uncle   . Colon cancer Neg Hx   . Esophageal cancer Neg Hx   . Rectal cancer Neg Hx   . Stomach cancer Neg Hx     No Known Allergies   Current Medications:   Current Outpatient Prescriptions:  .  azithromycin (ZITHROMAX Z-PAK) 250 MG tablet, Take 500 mg day 1. Take  250 mg day 2-5. (Patient not taking: Reported on 10/31/2016), Disp: 6 tablet, Rfl: 0 .  bismuth-metronidazole-tetracycline (PYLERA) 140-125-125 MG capsule, Take 3 capsules by mouth 4 (four) times daily -  before meals and at bedtime., Disp: , Rfl:  .  cyclobenzaprine (FLEXERIL) 10 MG tablet, Take 1 tablet (10 mg total) by mouth 2 (two) times daily as needed for muscle spasms., Disp: 20 tablet, Rfl: 0 .  dicyclomine (BENTYL) 20 MG tablet, Take 1 by mouth every 4-6 hours as needed for cramping, Disp: 60 tablet, Rfl: 2 .  diphenhydrAMINE (BENADRYL) 25 MG tablet, Take 1 tablet (25 mg total) by mouth every 6 (six) hours., Disp: 20 tablet, Rfl: 0 .  etodolac (LODINE) 300 MG capsule, Take 1 capsule (300 mg total) by mouth every 8 (eight) hours., Disp: 21 capsule, Rfl: 0 .  hydrocortisone 2.5 % lotion, Apply topically 2 (two) times daily., Disp: 59 mL, Rfl: 0 .  predniSONE (DELTASONE) 20 MG tablet, Take 2 tablets (40 mg total) by mouth daily., Disp: 8 tablet, Rfl: 0  Current Facility-Administered Medications:  .  0.9 %  sodium chloride infusion, 500 mL,  Intravenous, Continuous, Irene Shipper, MD   Review of Systems:   ROS  Vitals:   There were no vitals filed for this visit.   There is no height or weight on file to calculate BMI.  Physical Exam:   Physical Exam  Assessment and Plan:    There are no diagnoses linked to this encounter.  . Reviewed expectations re: course of current medical issues. . Discussed self-management of symptoms. . Outlined signs and symptoms indicating need for more acute intervention. . Patient verbalized understanding and all questions were answered. . See orders for this visit as documented in the electronic medical record. . Patient received an After-Visit Summary.  CMA or LPN served as scribe during this visit. History, Physical, and Plan performed by medical provider. Documentation and orders reviewed and attested to.  Inda Coke, PA-C

## 2016-12-05 NOTE — ED Notes (Signed)
Gave pt ice for back

## 2016-12-05 NOTE — ED Provider Notes (Signed)
[facility] DEPT Provider Note   CSN: [id number] Arrival date & time: 12/05/16  1658   By signing my name below, I, Eunice Blase, attest that this documentation has been prepared under the direction and in the presence of Debroah Baller, NP. Electronically signed, Eunice Blase, ED Scribe. 12/05/16. 6:38 PM.   History   Chief Complaint Chief Complaint  Patient presents with  . Back Pain   The history is provided by the patient and medical records. No language interpreter was used.    Natalie Thornton is a 31 y.o. female with h/o DDD and arthritis presenting to the Emergency Department concerning acute L mid back pain onset earlier today. Pt thinks she pulled a muscle picking up her niece; she states she heard a pop prior to onset of presenting pain. She describes "20"/10 constant ache worse with palpation, lifting and certain movements. Pt alleges she took an unknown muscle relaxant that "had a V on it" and ibuprofen ~12-1 PM today with no mention of quality of relief. Pt also states she has applied heat to the affected areas of her back without mention of quality of relief. She states her current pain is different from pain she experiences chronically. No N/V, abdominal pain, urinary frequency, urgency or dysuria. No other complaints at this time.   Past Medical History:  Diagnosis Date  . Anxiety   . Arthritis   . DDD (degenerative disc disease)   . Degenerative disc disease, cervical   . Disc disease, degenerative, cervical   . Pilonidal cyst     Patient Active Problem List   Diagnosis Date Noted  . Dyspnea on exertion 10/20/2016  . Cigarette smoker 10/20/2016  . Solitary pulmonary nodule 10/20/2016  . Acute contact dermatitis 10/20/2016  . Pilonidal sinus 07/05/2011    Past Surgical History:  Procedure Laterality Date  . COLONOSCOPY    . NO PAST SURGERIES      OB History    No data available       Home Medications    Prior to Admission medications     Medication Sig Start Date End Date Taking? Authorizing Provider  azithromycin (ZITHROMAX Z-PAK) 250 MG tablet Take 500 mg day 1. Take 250 mg day 2-5. Patient not taking: Reported on 10/31/2016 10/11/16   Maryan Puls, MD  bismuth-metronidazole-tetracycline [facility]) [phone] MG capsule Take 3 capsules by mouth 4 (four) times daily -  before meals and at bedtime.    [provider]  diclofenac (VOLTAREN) 50 MG EC tablet Take 1 tablet (50 mg total) by mouth 2 (two) times daily. 12/05/16   Ashley Murrain, NP  dicyclomine (BENTYL) 20 MG tablet Take 1 by mouth every 4-6 hours as needed for cramping 08/24/16   Irene Shipper, MD  diphenhydrAMINE (BENADRYL) 25 MG tablet Take 1 tablet (25 mg total) by mouth every 6 (six) hours. 10/14/16   Law, Bea Graff, PA-C  etodolac (LODINE) 300 MG capsule Take 1 capsule (300 mg total) by mouth every 8 (eight) hours. 11/14/16   Dorie Rank, MD  hydrocortisone 2.5 % lotion Apply topically 2 (two) times daily. 10/14/16   Law, Bea Graff, PA-C  methocarbamol (ROBAXIN) 500 MG tablet Take 1 tablet (500 mg total) by mouth 2 (two) times daily. 12/05/16   Ashley Murrain, NP  predniSONE (DELTASONE) 20 MG tablet Take 2 tablets (40 mg total) by mouth daily. 10/31/16   Recardo Evangelist, PA-C    Family History Family History  Problem Relation Age of Onset  .  Cancer Maternal Grandmother        breast  . AAA (abdominal aortic aneurysm) Maternal Grandmother   . Head & neck cancer Paternal Grandmother        Back cancer  . Cirrhosis Maternal Uncle   . Colon cancer Neg Hx   . Esophageal cancer Neg Hx   . Rectal cancer Neg Hx   . Stomach cancer Neg Hx     Social History Social History  Substance Use Topics  . Smoking status: Current Every Day Smoker    Packs/day: 1.00    Years: 10.00    Types: Cigars, Cigarettes  . Smokeless tobacco: Never Used  . Alcohol use Yes     Comment: occasionally     Allergies   Patient has no known allergies.   Review of  Systems Review of Systems  HENT: Negative.   Respiratory: Negative for shortness of breath.   Gastrointestinal: Negative for abdominal pain, nausea and vomiting.  Genitourinary: Negative for difficulty urinating, dysuria, frequency, hematuria and urgency.  Musculoskeletal: Positive for back pain.  Skin: Negative for wound.  Neurological: Negative for weakness and numbness.  Psychiatric/Behavioral: The patient is not nervous/anxious.      Physical Exam Updated Vital Signs BP 117/76 (BP Location: Left Arm)   Pulse 70   Temp 98.6 F (37 C) (Oral)   Resp 18   Ht 4\' 11"  (1.499 m)   Wt 162 lb (73.5 kg)   LMP 12/03/2016 (Exact Date)   SpO2 100%   BMI 32.72 kg/m   Physical Exam  Constitutional: She is oriented to person, place, and time. She appears well-developed and well-nourished. No distress.  HENT:  Head: Normocephalic and atraumatic.  Eyes: EOM are normal.  Neck: Normal range of motion. Neck supple.  Cardiovascular: Normal rate and regular rhythm.   Pulmonary/Chest: Effort normal and breath sounds normal.  Abdominal: Soft. Bowel sounds are normal. There is no tenderness.  Musculoskeletal: She exhibits tenderness.       Lumbar back: She exhibits tenderness and spasm. She exhibits normal pulse. Decreased range of motion: due to pain.  No C, T or L spine tenderness. Tenderness with palpation to Left thoracic area. Grips are equal. Radial pulses 2+ bilaterally.  Neurological: She is alert and oriented to person, place, and time. She has normal strength. No cranial nerve deficit or sensory deficit. Gait normal.  Reflex Scores:      Bicep reflexes are 2+ on the right side and 2+ on the left side.      Brachioradialis reflexes are 2+ on the right side and 2+ on the left side.      Patellar reflexes are 2+ on the right side and 2+ on the left side. Reflexes symmetrical and NL. Ambulatory with steady gait. No foot drag.  Skin: Skin is warm and dry.  Psychiatric: She has a normal  mood and affect. Her behavior is normal.  Nursing note and vitals reviewed.    ED Treatments / Results  DIAGNOSTIC STUDIES: Oxygen Saturation is 100% on RA, NL by my interpretation.    COORDINATION OF CARE: 6:17 PM-Discussed next steps with pt. Pt verbalized understanding and is agreeable with the plan. Will order medication for pain.   Labs (all labs ordered are listed, but only abnormal results are displayed) Labs Reviewed - No data to display  Radiology No results found.  Procedures Procedures (including critical care time)  Medications Ordered in ED Medications  cyclobenzaprine (FLEXERIL) tablet 10 mg (10 mg Oral Given 12/05/16  1823)  ketorolac (TORADOL) injection 30 mg (30 mg Intramuscular Given 12/05/16 1824)     Initial Impression / Assessment and Plan / ED Course  I have reviewed the triage vital signs and the nursing notes. Patient with back pain.  No neurological deficits and normal neuro exam.  Patient is ambulatory.  No loss of bowel or bladder control.  No concern for cauda equina.  No fever, night sweats, weight loss, h/o cancer, IVDA, no recent procedure to back. No urinary symptoms suggestive of UTI.  Supportive care and return precaution discussed. Appears safe for discharge at this time. Follow up as indicated in discharge paperwork.    9:16 PM  Patient reports some relief to pain with flexeril and ice. Will prepare pt for d/c.   Final Clinical Impressions(s) / ED Diagnoses   Final diagnoses:  Muscle spasm of back  Muscle strain    New Prescriptions Discharge Medication List as of 12/05/2016  7:06 PM    START taking these medications   Details  diclofenac (VOLTAREN) 50 MG EC tablet Take 1 tablet (50 mg total) by mouth 2 (two) times daily., Starting Mon 12/05/2016, Print    methocarbamol (ROBAXIN) 500 MG tablet Take 1 tablet (500 mg total) by mouth 2 (two) times daily., Starting Mon 12/05/2016, Print      I personally performed the services described in  this documentation, which was scribed in my presence. The recorded information has been reviewed and is accurate.    Debroah Baller [place], [place] 12/05/16 2119    Lacretia Leigh, MD 12/07/16 [phone]

## 2016-12-05 NOTE — ED Notes (Signed)
Pt states her pain is no better but she is getting sleepy and wants to leave. Notified PA

## 2016-12-05 NOTE — ED Triage Notes (Signed)
Pt st's she was picking up her niece this am and felt a pop in her back.  Pt c/o pain in left mid back

## 2016-12-13 ENCOUNTER — Ambulatory Visit: Payer: Self-pay | Attending: Internal Medicine | Admitting: Physician Assistant

## 2016-12-13 ENCOUNTER — Encounter: Payer: Self-pay | Admitting: *Deleted

## 2016-12-13 VITALS — BP 124/80 | HR 72 | Temp 98.6°F | Resp 16 | Wt 153.4 lb

## 2016-12-13 DIAGNOSIS — M546 Pain in thoracic spine: Secondary | ICD-10-CM | POA: Insufficient documentation

## 2016-12-13 DIAGNOSIS — M5030 Other cervical disc degeneration, unspecified cervical region: Secondary | ICD-10-CM | POA: Insufficient documentation

## 2016-12-13 MED ORDER — DICLOFENAC SODIUM 50 MG PO TBEC: 50.0000 mg | DELAYED_RELEASE_TABLET | Freq: Two times a day (BID) | ORAL | 0 refills | Status: DC

## 2016-12-13 MED ORDER — METHOCARBAMOL 500 MG PO TABS: 500.0000 mg | ORAL_TABLET | Freq: Two times a day (BID) | ORAL | 0 refills | Status: DC

## 2016-12-13 MED FILL — METHOCARBAMOL 500 MG TABLET: 500 | 10 days supply | Qty: 20 | Fill #0

## 2016-12-13 MED FILL — DICLOFENAC SOD EC 50 MG TAB: 50 | 7 days supply | Qty: 15 | Fill #0

## 2016-12-13 NOTE — Progress Notes (Signed)
Natalie Thornton  DEY:[id number]  DJS:[id number]  DOB - August 08, 1985  Chief Complaint  Patient presents with  . Follow-up       Subjective:   Natalie Thornton is a 31 y.o. female here today for establishment of care. She has a past medical history of degenerative disc disease only. She's been relatively healthy. She's had 8 emergency department visits in 2018 alone. Her most recent one was on 12/05/2016. She has a disabled niece that she was trying to lift and felt a pop in the middle aspect of her back. Is more to the left side. Feels like she pulled a muscle. No labs or imaging studies were done in the emergency department. She was given a shot. She also was sent home with anti-inflammatories and muscle relaxers. She did not have the mild to get either of these medications field. She still suffering from the same discomfort.  ROS: GEN: denies fever or chills, denies change in weight Skin: denies lesions or rashes HEENT: denies headache, earache, epistaxis, sore throat, or neck pain LUNGS: denies SHOB, dyspnea, PND, orthopnea CV: denies CP or palpitations ABD: denies abd pain, N or V EXT: + muscle spasms or swelling; no pain in lower ext, no weakness NEURO: denies numbness or tingling, denies sz, stroke or TIA  ALLERGIES: No Known Allergies  PAST MEDICAL HISTORY: Past Medical History:  Diagnosis Date  . Anxiety   . Arthritis   . DDD (degenerative disc disease)   . Degenerative disc disease, cervical   . Disc disease, degenerative, cervical   . Pilonidal cyst     PAST SURGICAL HISTORY: Past Surgical History:  Procedure Laterality Date  . COLONOSCOPY    . NO PAST SURGERIES      MEDICATIONS AT HOME: Prior to Admission medications   Medication Sig Start Date End Date Taking? Authorizing Provider  azithromycin (ZITHROMAX Z-PAK) 250 MG tablet Take 500 mg day 1. Take 250 mg day 2-5. Patient not taking: Reported on 10/31/2016 10/11/16   Maryan Puls, MD    bismuth-metronidazole-tetracycline [facility]) [phone] MG capsule Take 3 capsules by mouth 4 (four) times daily -  before meals and at bedtime.    [provider]  diclofenac (VOLTAREN) 50 MG EC tablet Take 1 tablet (50 mg total) by mouth 2 (two) times daily. 12/13/16   Brayton Caves, PA-C  dicyclomine (BENTYL) 20 MG tablet Take 1 by mouth every 4-6 hours as needed for cramping Patient not taking: Reported on 12/13/2016 08/24/16   Irene Shipper, MD  diphenhydrAMINE (BENADRYL) 25 MG tablet Take 1 tablet (25 mg total) by mouth every 6 (six) hours. Patient not taking: Reported on 12/13/2016 10/14/16   Frederica Kuster, PA-C  etodolac (LODINE) 300 MG capsule Take 1 capsule (300 mg total) by mouth every 8 (eight) hours. Patient not taking: Reported on 12/13/2016 11/14/16   Dorie Rank, MD  hydrocortisone 2.5 % lotion Apply topically 2 (two) times daily. Patient not taking: Reported on 12/13/2016 10/14/16   Frederica Kuster, PA-C  methocarbamol (ROBAXIN) 500 MG tablet Take 1 tablet (500 mg total) by mouth 2 (two) times daily. 12/13/16   Brayton Caves, PA-C  predniSONE (DELTASONE) 20 MG tablet Take 2 tablets (40 mg total) by mouth daily. Patient not taking: Reported on 12/13/2016 10/31/16   Recardo Evangelist, PA-C    Family History  Problem Relation Age of Onset  . Cancer Maternal Grandmother        breast  . AAA (abdominal aortic aneurysm) Maternal  Grandmother   . Head & neck cancer Paternal Grandmother        Back cancer  . Cirrhosis Maternal Uncle   . Colon cancer Neg Hx   . Esophageal cancer Neg Hx   . Rectal cancer Neg Hx   . Stomach cancer Neg Hx    Social-unmarried, works at [employer]  Objective:   Vitals:   12/13/16 1208  BP: 124/80  Pulse: 72  Resp: 16  Temp: 98.6 F (37 C)  TempSrc: Oral  SpO2: 98%  Weight: 153 lb 6.4 oz (69.6 kg)    Exam General appearance : Awake, alert, not in any distress. Speech Clear. Not toxic looking HEENT: Atraumatic and Normocephalic,  pupils equally reactive to light and accomodation Abdomen: Bowel sounds present, Non tender and not distended with no guarding, rigidity or rebound. Extremities: B/L Lower Ext shows no edema, both legs are warm to touch; can reproduce the thoracic muscular back pain Neurology: Awake alert, and oriented X 3, CN II-XII intact, Non focal   Assessment & Plan  1. Mid back pain-sounds MSK  -use our pharmacy for NSAIDS/muscle relaxer  -moist heat  -possible job change ([employer])   Return in about 4 weeks (around 01/10/2017).routine health maintenance.  The patient was given clear instructions to go to ER or return to medical center if symptoms don't improve, worsen or new problems develop. The patient verbalized understanding. The patient was told to call to get lab results if they haven't heard anything in the next week.   This note has been created with Surveyor, quantity. Any transcriptional errors are unintentional.    Zettie Pho, PA-C [facility] [place], [place]   12/13/2016, 12:20 PM

## 2016-12-14 ENCOUNTER — Telehealth: Payer: Self-pay | Admitting: Internal Medicine

## 2016-12-14 DIAGNOSIS — M545 Low back pain, unspecified: Secondary | ICD-10-CM

## 2016-12-14 NOTE — Telephone Encounter (Signed)
Pt called to request a referral "somewhere" to have her back looked at stating that she is in excruciating pain. I advised pt that her new PCP will most likely need to see her before she submits a referral but pt wants to request a referral. Please f/u.

## 2017-01-06 ENCOUNTER — Encounter: Payer: Self-pay | Admitting: Internal Medicine

## 2017-01-06 ENCOUNTER — Ambulatory Visit: Payer: Self-pay | Attending: Internal Medicine | Admitting: Internal Medicine

## 2017-01-06 ENCOUNTER — Other Ambulatory Visit (HOSPITAL_COMMUNITY)
Admission: RE | Admit: 2017-01-06 | Discharge: 2017-01-06 | Disposition: A | Discharge status: Home / Self Care | Payer: Medicaid Other | Source: Ambulatory Visit | Attending: Internal Medicine | Admitting: Internal Medicine

## 2017-01-06 VITALS — BP 118/78 | HR 98 | Temp 98.9°F | Resp 16 | Wt 150.2 lb

## 2017-01-06 DIAGNOSIS — Z01419 Encounter for gynecological examination (general) (routine) without abnormal findings: Secondary | ICD-10-CM | POA: Insufficient documentation

## 2017-01-06 DIAGNOSIS — F419 Anxiety disorder, unspecified: Secondary | ICD-10-CM | POA: Insufficient documentation

## 2017-01-06 DIAGNOSIS — Z124 Encounter for screening for malignant neoplasm of cervix: Secondary | ICD-10-CM

## 2017-01-06 DIAGNOSIS — Z23 Encounter for immunization: Secondary | ICD-10-CM

## 2017-01-06 DIAGNOSIS — F17290 Nicotine dependence, other tobacco product, uncomplicated: Secondary | ICD-10-CM | POA: Insufficient documentation

## 2017-01-06 DIAGNOSIS — F32A Depression, unspecified: Secondary | ICD-10-CM | POA: Insufficient documentation

## 2017-01-06 DIAGNOSIS — G8929 Other chronic pain: Secondary | ICD-10-CM | POA: Insufficient documentation

## 2017-01-06 DIAGNOSIS — M549 Dorsalgia, unspecified: Secondary | ICD-10-CM | POA: Insufficient documentation

## 2017-01-06 DIAGNOSIS — F329 Major depressive disorder, single episode, unspecified: Secondary | ICD-10-CM | POA: Insufficient documentation

## 2017-01-06 DIAGNOSIS — Z131 Encounter for screening for diabetes mellitus: Secondary | ICD-10-CM | POA: Insufficient documentation

## 2017-01-06 DIAGNOSIS — Z720 Tobacco use: Secondary | ICD-10-CM | POA: Insufficient documentation

## 2017-01-06 LAB — GLUCOSE, POCT (MANUAL RESULT ENTRY): POC GLUCOSE: 5.1 mg/dL — AB (ref 70–99)

## 2017-01-06 MED ORDER — DULOXETINE HCL 20 MG PO CPEP: 20.0000 mg | ORAL_CAPSULE | Freq: Every day | ORAL | 3 refills | Status: DC

## 2017-01-06 MED ORDER — DICLOFENAC SODIUM 1 % TD GEL: 2.0000 g | Freq: Four times a day (QID) | TRANSDERMAL | 2 refills | Status: DC

## 2017-01-06 NOTE — Progress Notes (Signed)
Patient ID: Natalie Thornton, female    DOB: 1985/08/25  MRN: [id number]  CC: Establish Care and Gynecologic Exam   Subjective: Natalie Thornton is a 31 y.o. female who presents for f/u visit. Her concerns today include:  Hx of anxiety, DD and frequent ER visits.  Saw our PA recently for mid back pain post lifting.   Here for well women exam: G0P0 -does not recall when last PAP -no vaginal dischg or itching Sexually active with females only On Depo x 1 mth - for heavy periods and cramps -wants to be checked for all cancers  Soc: 5 cigars a day.  Smoked since age 59. Quit for 57mths earlier this yr. Not ready to quit again Drinks occasionally. Some days she would drink Vodka for several days No street drugs Works at [employer] and GAD7 positive.  Was on Zoloft in past which helped.  Not interested in talking about any current issues or being placed back on med  Chronic Back Pain:  Has DDD at L5-S1 and mild disc space narrowing at T3-T8 Was getting epidural shots in the past which would help for several days Worse with movement. "Like something pinching back there." Then I have to stop for a minute then start again.  Working, putting on clothes all cause pain.  Use to be just lower back but whole entire back  No numbness in legs -tried various antii-nflam and muscle relaxants Patient Active Problem List   Diagnosis Date Noted  . Diabetes mellitus screening 01/06/2017  . Dyspnea on exertion 10/20/2016  . Cigarette smoker 10/20/2016  . Solitary pulmonary nodule 10/20/2016  . Acute contact dermatitis 10/20/2016  . Pilonidal sinus 07/05/2011     Current Outpatient Prescriptions on File Prior to Visit  Medication Sig Dispense Refill  . bismuth-metronidazole-tetracycline (PYLERA) 140-125-125 MG capsule Take 3 capsules by mouth 4 (four) times daily -  before meals and at bedtime.    . diclofenac (VOLTAREN) 50 MG EC tablet Take 1 tablet (50 mg total) by mouth 2 (two) times  daily. 15 tablet 0  . methocarbamol (ROBAXIN) 500 MG tablet Take 1 tablet (500 mg total) by mouth 2 (two) times daily. 20 tablet 0   Current Facility-Administered Medications on File Prior to Visit  Medication Dose Route Frequency Provider Last Rate Last Dose  . 0.9 %  sodium chloride infusion  500 mL Intravenous Continuous Irene Shipper, MD        No Known Allergies  Social History   Social History  . Marital status: Married    Spouse name: N/A  . Number of children: 2  . Years of education: N/A   Occupational History  . Not on file.   Social History Main Topics  . Smoking status: Current Every Day Smoker    Packs/day: 5.00    Years: 10.00    Types: Cigars  . Smokeless tobacco: Never Used  . Alcohol use Yes     Comment: occasionally  . Drug use: No  . Sexual activity: Not on file   Other Topics Concern  . Not on file   Social History Narrative  . No narrative on file    Family History  Problem Relation Age of Onset  . Cancer Maternal Grandmother        breast  . AAA (abdominal aortic aneurysm) Maternal Grandmother   . Leukemia Maternal Grandmother   . Head & neck cancer Paternal Grandmother        Back  cancer  . Lymphoma Paternal Grandmother   . Cirrhosis Maternal Uncle   . Breast cancer Other   . Colon cancer Neg Hx   . Esophageal cancer Neg Hx   . Rectal cancer Neg Hx   . Stomach cancer Neg Hx     Past Surgical History:  Procedure Laterality Date  . COLONOSCOPY    . NO PAST SURGERIES      ROS: Review of Systems As above  PHYSICAL EXAM: BP 118/78   Pulse 98   Temp 98.9 F (37.2 C) (Oral)   Resp 16   Wt 150 lb 3.2 oz (68.1 kg)   SpO2 98%   BMI 30.34 kg/m   BP 118/78 Physical Exam General appearance - alert, well appearing, and in no distress Mental status - alert, oriented to person, place, and time, normal mood, behavior, speech, dress, motor activity, and thought processes Eyes - pupils equal and reactive, extraocular eye movements  intact Ears - bilateral TM's and external ear canals normal Nose - normal and patent, no erythema, discharge or polyps Mouth - mucous membranes moist, pharynx normal without lesions Neck - supple, no significant adenopathy Lymphatics - no palpable lymphadenopathy, no hepatosplenomegaly Chest - clear to auscultation, no wheezes, rales or rhonchi, symmetric air entry Heart - normal rate, regular rhythm, normal S1, S2, no murmurs, rubs, clicks or gallops Abdomen - soft, nontender, nondistended, no masses or organomegaly Breasts - breasts appear normal, no suspicious masses, no skin or nipple changes or axillary nodes Pelvic - Natalie Thornton present: normal external genitalia, vulva, vagina, cervix, uterus and adnexa Neurological - cranial nerves II through XII intact, motor and sensory grossly normal bilaterally Musculoskeletal -no significant abnormal spinal curvature Extremities - no LE edema  Depression screen PHQ 2/9 01/06/2017  Decreased Interest 3  Down, Depressed, Hopeless 1  PHQ - 2 Score 4  Altered sleeping 3  Tired, decreased energy 3  Change in appetite 3  Feeling bad or failure about yourself  0  Trouble concentrating 0  Moving slowly or fidgety/restless 0  Suicidal thoughts 0  PHQ-9 Score 13   GAD 7 : Generalized Anxiety Score 01/06/2017 12/13/2016  Nervous, Anxious, on Edge 0 0  Control/stop worrying 0 1  Worry too much - different things 1 2  Trouble relaxing 3 3  Restless 0 0  Easily annoyed or irritable 3 3  Afraid - awful might happen 1 0  Total GAD 7 Score 8 9   Results for orders placed or performed in visit on 01/06/17  POCT glucose (manual entry)  Result Value Ref Range   POC Glucose 5.1 (A) 70 - 99 mg/dl     ASSESSMENT AND PLAN: 1. Pap smear for cervical cancer screening - Cytology - PAP Declined STD screen  2. Diabetes mellitus screening -neg screen - POCT glucose (manual entry)  3. Tobacco abuse Patient advised to quit smoking. Discussed health  risks associated with smoking including lung and other types of cancers, chronic lung diseases and CV risks.. Pt not ready to give trail of quitting.   4. Anxiety and depression -pt not wanting to take about any issues today However, discussed that we can use med called Cymbalta to help with chronic pain and any under laying depression. Pt willing to give it a try  5. Need for Tdap vaccination - Tdap vaccine greater than or equal to 7yo IM  6. Chronic back pain, unspecified back location, unspecified back pain laterality -see #4 above -Also recommended Voltaren gel Printed  info given on back exercises to help with chronic pain Get [facility] discount so we can refer to P.T in future as needed   Patient was given the opportunity to ask questions.  Patient verbalized understanding of the plan and was able to repeat key elements of the plan.   Orders Placed This Encounter  Procedures  . Tdap vaccine greater than or equal to 7yo IM  . POCT glucose (manual entry)     Requested Prescriptions   Signed Prescriptions Disp Refills  . DULoxetine (CYMBALTA) 20 MG capsule 30 capsule 3    Sig: Take 1 capsule (20 mg total) by mouth daily.  . diclofenac sodium (VOLTAREN) 1 % GEL 100 g 2    Sig: Apply 2 g topically 4 (four) times daily.    No Follow-up on file.  Karle Plumber, MD, FACP

## 2017-01-06 NOTE — Patient Instructions (Addendum)
Back Exercises If you have pain in your back, do these exercises 2-3 times each day or as told by your doctor. When the pain goes away, do the exercises once each day, but repeat the steps more times for each exercise (do more repetitions). If you do not have pain in your back, do these exercises once each day or as told by your doctor. Exercises Single Knee to Chest  Do these steps 3-5 times in a row for each leg: 1. Lie on your back on a firm bed or the floor with your legs stretched out. 2. Bring one knee to your chest. 3. Hold your knee to your chest by grabbing your knee or thigh. 4. Pull on your knee until you feel a gentle stretch in your lower back. 5. Keep doing the stretch for 10-30 seconds. 6. Slowly let go of your leg and straighten it.  Pelvic Tilt  Do these steps 5-10 times in a row: 1. Lie on your back on a firm bed or the floor with your legs stretched out. 2. Bend your knees so they point up to the ceiling. Your feet should be flat on the floor. 3. Tighten your lower belly (abdomen) muscles to press your lower back against the floor. This will make your tailbone point up to the ceiling instead of pointing down to your feet or the floor. 4. Stay in this position for 5-10 seconds while you gently tighten your muscles and breathe evenly.  Cat-Cow  Do these steps until your lower back bends more easily: 1. Get on your hands and knees on a firm surface. Keep your hands under your shoulders, and keep your knees under your hips. You may put padding under your knees. 2. Let your head hang down, and make your tailbone point down to the floor so your lower back is round like the back of a cat. 3. Stay in this position for 5 seconds. 4. Slowly lift your head and make your tailbone point up to the ceiling so your back hangs low (sags) like the back of a cow. 5. Stay in this position for 5 seconds.  Press-Ups  Do these steps 5-10 times in a row: 1. Lie on your belly  (face-down) on the floor. 2. Place your hands near your head, about shoulder-width apart. 3. While you keep your back relaxed and keep your hips on the floor, slowly straighten your arms to raise the top half of your body and lift your shoulders. Do not use your back muscles. To make yourself more comfortable, you may change where you place your hands. 4. Stay in this position for 5 seconds. 5. Slowly return to lying flat on the floor.  Bridges  Do these steps 10 times in a row: 1. Lie on your back on a firm surface. 2. Bend your knees so they point up to the ceiling. Your feet should be flat on the floor. 3. Tighten your butt muscles and lift your butt off of the floor until your waist is almost as high as your knees. If you do not feel the muscles working in your butt and the back of your thighs, slide your feet 1-2 inches farther away from your butt. 4. Stay in this position for 3-5 seconds. 5. Slowly lower your butt to the floor, and let your butt muscles relax.  If this exercise is too easy, try doing it with your arms crossed over your chest. Belly Crunches  Do these steps 5-10 times  in a row: 1. Lie on your back on a firm bed or the floor with your legs stretched out. 2. Bend your knees so they point up to the ceiling. Your feet should be flat on the floor. 3. Cross your arms over your chest. 4. Tip your chin a little bit toward your chest but do not bend your neck. 5. Tighten your belly muscles and slowly raise your chest just enough to lift your shoulder blades a tiny bit off of the floor. 6. Slowly lower your chest and your head to the floor.  Back Lifts Do these steps 5-10 times in a row: 1. Lie on your belly (face-down) with your arms at your sides, and rest your forehead on the floor. 2. Tighten the muscles in your legs and your butt. 3. Slowly lift your chest off of the floor while you keep your hips on the floor. Keep the back of your head in line with the curve in your  back. Look at the floor while you do this. 4. Stay in this position for 3-5 seconds. 5. Slowly lower your chest and your face to the floor.  Contact a doctor if:  Your back pain gets a lot worse when you do an exercise.  Your back pain does not lessen 2 hours after you exercise. If you have any of these problems, stop doing the exercises. Do not do them again unless your doctor says it is okay. Get help right away if:  You have sudden, very bad back pain. If this happens, stop doing the exercises. Do not do them again unless your doctor says it is okay. This information is not intended to replace advice given to you by your health care provider. Make sure you discuss any questions you have with your health care provider. Document Released: 06/25/2010 Document Revised: 10/29/2015 Document Reviewed: 07/17/2014 Elsevier Interactive Patient Education  2018 [place] (Tetanus and Diphtheria): What You Need to Know 1. Why get vaccinated? Tetanus  and diphtheria are very serious diseases. They are rare in the [place] today, but people who do become infected often have severe complications. Td vaccine is used to protect adolescents and adults from both of these diseases. Both tetanus and diphtheria are infections caused by bacteria. Diphtheria spreads from person to person through coughing or sneezing. Tetanus-causing bacteria enter the body through cuts, scratches, or wounds. TETANUS (lockjaw) causes painful muscle tightening and stiffness, usually all over the body.  It can lead to tightening of muscles in the head and neck so you can't open your mouth, swallow, or sometimes even breathe. Tetanus kills about 1 out of every 10 people who are infected even after receiving the best medical care.  DIPHTHERIA can cause a thick coating to form in the back of the throat.  It can lead to breathing problems, paralysis, heart failure, and death.  Before vaccines, as many as  200,000 cases of diphtheria and hundreds of cases of tetanus were reported in the [place] each year. Since vaccination began, reports of cases for both diseases have dropped by about 99%. 2. Td vaccine Td vaccine can protect adolescents and adults from tetanus and diphtheria. Td is usually given as a booster dose every 10 years but it can also be given earlier after a severe and dirty wound or burn. Another vaccine, called Tdap, which protects against pertussis in addition to tetanus and diphtheria, is sometimes recommended instead of Td vaccine. Your doctor or the person giving  you the vaccine can give you more information. Td may safely be given at the same time as other vaccines. 3. Some people should not get this vaccine  A person who has ever had a life-threatening allergic reaction after a previous dose of any tetanus or diphtheria containing vaccine, OR has a severe allergy to any part of this vaccine, should not get Td vaccine. Tell the person giving the vaccine about any severe allergies.  Talk to your doctor if you: ? had severe pain or swelling after any vaccine containing diphtheria or tetanus, ? ever had a condition called Guillain Barre Syndrome (GBS), ? aren't feeling well on the day the shot is scheduled. 4. What are the risks from Td vaccine? With any medicine, including vaccines, there is a chance of side effects. These are usually mild and go away on their own. Serious reactions are also possible but are rare. Most people who get Td vaccine do not have any problems with it. Mild problems following Td vaccine: (Did not interfere with activities)  Pain where the shot was given (about 8 people in 10)  Redness or swelling where the shot was given (about 1 person in 4)  Mild fever (rare)  Headache (about 1 person in 4)  Tiredness (about 1 person in 4)  Moderate problems following Td vaccine: (Interfered with activities, but did not require medical  attention)  Fever over 102F (rare)  Severe problems following Td vaccine: (Unable to perform usual activities; required medical attention)  Swelling, severe pain, bleeding and/or redness in the arm where the shot was given (rare).  Problems that could happen after any vaccine:  People sometimes faint after a medical procedure, including vaccination. Sitting or lying down for about 15 minutes can help prevent fainting, and injuries caused by a fall. Tell your doctor if you feel dizzy, or have vision changes or ringing in the ears.  Some people get severe pain in the shoulder and have difficulty moving the arm where a shot was given. This happens very rarely.  Any medication can cause a severe allergic reaction. Such reactions from a vaccine are very rare, estimated at fewer than 1 in a million doses, and would happen within a few minutes to a few hours after the vaccination. As with any medicine, there is a very remote chance of a vaccine causing a serious injury or death. The safety of vaccines is always being monitored. For more information, visit: http://www.aguilar.org/ 5. What if there is a serious reaction? What should I look for? Look for anything that concerns you, such as signs of a severe allergic reaction, very high fever, or unusual behavior. Signs of a severe allergic reaction can include hives, swelling of the face and throat, difficulty breathing, a fast heartbeat, dizziness, and weakness. These would usually start a few minutes to a few hours after the vaccination. What should I do?  If you think it is a severe allergic reaction or other emergency that can't wait, call 9-1-1 or get the person to the nearest hospital. Otherwise, call your doctor.  Afterward, the reaction should be reported to the Vaccine Adverse Event Reporting System (VAERS). Your doctor might file this report, or you can do it yourself through the VAERS web site at www.vaers.SamedayNews.es, or by calling  (480) 513-5368. ? VAERS does not give medical advice. 6. The National Vaccine Injury Compensation Program The National Vaccine Injury Compensation Program (VICP) is a federal program that was created to compensate people who may have been injured  by certain vaccines. Persons who believe they may have been injured by a vaccine can learn about the program and about filing a claim by calling 848-740-6827 or visiting the [place] website at GoldCloset.com.ee. There is a time limit to file a claim for compensation. 7. How can I learn more?  Ask your doctor. He or she can give you the vaccine package insert or suggest other sources of information.  Call your local or state health department.  Contact the Centers for Disease Control and Prevention (CDC): ? Call 201-789-7261 (1-800-CDC-INFO) ? Visit CDC's website at http://hunter.com/ CDC Td Vaccine VIS (09/15/15) This information is not intended to replace advice given to you by your health care provider. Make sure you discuss any questions you have with your health care provider. Document Released: 03/20/2006 Document Revised: 02/11/2016 Document Reviewed: 02/11/2016 Elsevier Interactive Patient Education  2017 [employer].

## 2017-01-06 NOTE — Telephone Encounter (Signed)
See note

## 2017-01-09 LAB — CYTOLOGY - PAP: DIAGNOSIS: NEGATIVE

## 2017-01-13 ENCOUNTER — Emergency Department (HOSPITAL_COMMUNITY)
Admission: EM | Admit: 2017-01-13 | Discharge: 2017-01-13 | Discharge status: Against Medical Advice | Payer: Medicaid Other | Attending: Emergency Medicine | Admitting: Emergency Medicine

## 2017-01-13 ENCOUNTER — Ambulatory Visit: Payer: Self-pay

## 2017-01-13 ENCOUNTER — Encounter (HOSPITAL_COMMUNITY): Payer: Self-pay | Admitting: Emergency Medicine

## 2017-01-13 DIAGNOSIS — R0789 Other chest pain: Secondary | ICD-10-CM | POA: Insufficient documentation

## 2017-01-13 DIAGNOSIS — R0781 Pleurodynia: Secondary | ICD-10-CM

## 2017-01-13 DIAGNOSIS — F17290 Nicotine dependence, other tobacco product, uncomplicated: Secondary | ICD-10-CM | POA: Insufficient documentation

## 2017-01-13 NOTE — ED Provider Notes (Signed)
[facility] DEPT Provider Note   CSN: [id number] Arrival date & time: 01/13/17  1608  By signing my name below, I, Reola Mosher, attest that this documentation has been prepared under the direction and in the presence of Shawn Joy, PA-C.  Electronically Signed: Reola Mosher, ED Scribe. 01/13/17. 6:54 PM.  History   Chief Complaint Chief Complaint  Patient presents with  . Chest Pain   The history is provided by the patient. No language interpreter was used.   HPI Comments: Natalie Thornton is a 31 y.o. female with a h/o DDD, arthritis, who presents to the Emergency Department complaining of intermittent episodes of bilateral ribcage pain beginning three weeks ago. Pt notes that she has had chest pain at baseline for the past several months which is has been intermittent; however, three weeks ago she has had accompanying bilateral throbbing ribcage pain. No known injury to the area. She has not noticed any precipitating factors to either points of pain. She also notes a mild cough with this as well which will worsen her chest pain. Pt has been taking Ibuprofen and muscle relaxants which will temporarily relieve her pain. She has previously been evaluated in the ED for her pain, most recently on 06/28 with benign imaging and laboratory studies, including negative troponin. Dx'd w/ chest wall pain at that time. Pt is a current, everyday smoker at ~6 mini cigars a day. She denies breast pain or discharge, abdominal pain, neck pain, back pain, fever, shortness of breath, or any other associated symptoms.     Past Medical History:  Diagnosis Date  . Anxiety   . Arthritis   . DDD (degenerative disc disease)   . Degenerative disc disease, cervical   . Disc disease, degenerative, cervical   . Pilonidal cyst    Patient Active Problem List   Diagnosis Date Noted  . Diabetes mellitus screening 01/06/2017  . Tobacco abuse 01/06/2017  . Anxiety and depression 01/06/2017  .  Dyspnea on exertion 10/20/2016  . Cigarette smoker 10/20/2016  . Solitary pulmonary nodule 10/20/2016  . Acute contact dermatitis 10/20/2016  . Pilonidal sinus 07/05/2011   Past Surgical History:  Procedure Laterality Date  . COLONOSCOPY    . NO PAST SURGERIES      OB History    No data available       Home Medications    Prior to Admission medications   Medication Sig Start Date End Date Taking? Authorizing Provider  bismuth-metronidazole-tetracycline [facility]) [phone] MG capsule Take 3 capsules by mouth 4 (four) times daily -  before meals and at bedtime.    [provider]  diclofenac (VOLTAREN) 50 MG EC tablet Take 1 tablet (50 mg total) by mouth 2 (two) times daily. 12/13/16   Brayton Caves, PA-C  diclofenac sodium (VOLTAREN) 1 % GEL Apply 2 g topically 4 (four) times daily. 01/06/17   Ladell Pier, MD  DULoxetine (CYMBALTA) 20 MG capsule Take 1 capsule (20 mg total) by mouth daily. 01/06/17   Ladell Pier, MD  methocarbamol (ROBAXIN) 500 MG tablet Take 1 tablet (500 mg total) by mouth 2 (two) times daily. 12/13/16   Brayton Caves, PA-C   Family History Family History  Problem Relation Age of Onset  . Cancer Maternal Grandmother        breast  . AAA (abdominal aortic aneurysm) Maternal Grandmother   . Leukemia Maternal Grandmother   . Head & neck cancer Paternal Grandmother  Back cancer  . Lymphoma Paternal Grandmother   . Cirrhosis Maternal Uncle   . Breast cancer Other   . Colon cancer Neg Hx   . Esophageal cancer Neg Hx   . Rectal cancer Neg Hx   . Stomach cancer Neg Hx    Social History Social History  Substance Use Topics  . Smoking status: Current Every Day Smoker    Packs/day: 5.00    Years: 10.00    Types: Cigars  . Smokeless tobacco: Never Used  . Alcohol use Yes     Comment: occasionally   Allergies   Patient has no known allergies.  Review of Systems Review of Systems  Constitutional: Negative for fever.    Respiratory: Positive for cough.   Cardiovascular: Positive for chest pain.  Gastrointestinal: Negative for abdominal pain.  Musculoskeletal: Negative for back pain and neck pain.   Physical Exam Updated Vital Signs BP 126/72 (BP Location: Right Arm)   Pulse 79   Temp 98.3 F (36.8 C) (Oral)   Resp 14   Ht 4\' 11"  (1.499 m)   Wt 68 kg (150 lb)   LMP 01/12/2017 (Exact Date)   SpO2 99%   BMI 30.30 kg/m   Physical Exam  Constitutional: She appears well-developed and well-nourished. No distress.  HENT:  Head: Normocephalic and atraumatic.  Eyes: Conjunctivae are normal.  Neck: Neck supple.  Cardiovascular: Normal rate and regular rhythm.   Pulmonary/Chest: Effort normal.  No increased work of breathing noted. Speaks in full sentences without difficulty. She is noted to be talking and laughing without hesitation in the hallway prior to evaluation.   Neurological: She is alert.  Skin: Skin is warm and dry. She is not diaphoretic.  Psychiatric: She has a normal mood and affect. Her behavior is normal.  Nursing note and vitals reviewed.  ED Treatments / Results  DIAGNOSTIC STUDIES: Oxygen Saturation is 99% on RA, normal by my interpretation.   COORDINATION OF CARE: 6:54 PM-Discussed next steps with pt. Pt verbalized understanding and is agreeable with the plan.   Labs (all labs ordered are listed, but only abnormal results are displayed) Labs Reviewed - No data to display  EKG  EKG Interpretation None      Radiology No results found.  Procedures Procedures   Medications Ordered in ED Medications - No data to display  Initial Impression / Assessment and Plan / ED Course  I have reviewed the triage vital signs and the nursing notes.  Pertinent labs & imaging results that were available during my care of the patient were reviewed by me and considered in my medical decision making (see chart for details).     Patient presents with complaint of chest and rib  pain. Patient refused hands-on assessment, despite explanation. States, "If you're telling me that everything is okay, then I'm ready to go." It was explained to the patient that her vital signs and EKG look good, but I would like to at least do a physical exam and further testing. Patient states, "I don't want to do anything else. I'm not really all that worried about it to justify all of that. I'm going to go." Patient was encouraged to return should she change her mind. Patient left AMA and walked out without further comment and without waiting for official discharge.  Vitals:   01/13/17 1707 01/13/17 1711 01/13/17 1820  BP: 128/82  126/72  Pulse: 92  79  Resp: 14  14  Temp: 98.4 F (36.9 C)  98.3  F (36.8 C)  TempSrc: Oral  Oral  SpO2: 99%  99%  Weight:  68 kg (150 lb)   Height:  4\' 11"  (1.499 m)      Final Clinical Impressions(s) / ED Diagnoses   Final diagnoses:  Rib pain   New Prescriptions Discharge Medication List as of 01/13/2017  7:09 PM     I personally performed the services described in this documentation, which was scribed in my presence. The recorded information has been reviewed and is accurate.   Layla Maw 01/13/17 2111    Nat Christen, MD 01/14/17 2213

## 2017-01-13 NOTE — ED Triage Notes (Signed)
Patient presents with c/o bilateral rib pain, across chest area. Denies any known injury

## 2017-03-01 ENCOUNTER — Encounter: Payer: Self-pay | Admitting: Physician Assistant

## 2017-03-02 ENCOUNTER — Emergency Department (HOSPITAL_COMMUNITY)
Admission: EM | Admit: 2017-03-02 | Discharge: 2017-03-02 | Disposition: A | Discharge status: Home / Self Care | Payer: Self-pay

## 2017-03-02 ENCOUNTER — Encounter (HOSPITAL_COMMUNITY): Payer: Self-pay | Admitting: Emergency Medicine

## 2017-03-02 NOTE — ED Notes (Signed)
Pt no longer in waiting room 

## 2017-03-02 NOTE — ED Notes (Signed)
Third call in lobby. No response.

## 2017-03-02 NOTE — ED Triage Notes (Signed)
Pt states she has been having a sore throat/cough today. Pt started vomiting and has had burning since in her throat since the vomiting. Pt also complains of rib pain from coughing and vomiting.

## 2017-03-02 NOTE — ED Notes (Signed)
Called Pt for vitals update in lobby. No response.

## 2017-03-02 NOTE — ED Notes (Signed)
Second call in lobby. No answer

## 2017-03-06 ENCOUNTER — Encounter: Payer: Self-pay | Admitting: Internal Medicine

## 2017-03-06 ENCOUNTER — Ambulatory Visit: Payer: Self-pay | Attending: Internal Medicine | Admitting: Internal Medicine

## 2017-03-06 VITALS — BP 133/82 | HR 77 | Temp 99.7°F | Resp 16 | Wt 148.4 lb

## 2017-03-06 DIAGNOSIS — M545 Low back pain: Secondary | ICD-10-CM | POA: Insufficient documentation

## 2017-03-06 DIAGNOSIS — R109 Unspecified abdominal pain: Secondary | ICD-10-CM | POA: Insufficient documentation

## 2017-03-06 DIAGNOSIS — R1012 Left upper quadrant pain: Secondary | ICD-10-CM | POA: Insufficient documentation

## 2017-03-06 DIAGNOSIS — M549 Dorsalgia, unspecified: Secondary | ICD-10-CM

## 2017-03-06 DIAGNOSIS — F329 Major depressive disorder, single episode, unspecified: Secondary | ICD-10-CM | POA: Insufficient documentation

## 2017-03-06 DIAGNOSIS — F419 Anxiety disorder, unspecified: Secondary | ICD-10-CM | POA: Insufficient documentation

## 2017-03-06 DIAGNOSIS — F17290 Nicotine dependence, other tobacco product, uncomplicated: Secondary | ICD-10-CM | POA: Insufficient documentation

## 2017-03-06 DIAGNOSIS — G8929 Other chronic pain: Secondary | ICD-10-CM | POA: Insufficient documentation

## 2017-03-06 DIAGNOSIS — F1020 Alcohol dependence, uncomplicated: Secondary | ICD-10-CM | POA: Insufficient documentation

## 2017-03-06 DIAGNOSIS — F17210 Nicotine dependence, cigarettes, uncomplicated: Secondary | ICD-10-CM | POA: Insufficient documentation

## 2017-03-06 MED ORDER — OMEPRAZOLE 20 MG PO CPDR: 20.0000 mg | DELAYED_RELEASE_CAPSULE | Freq: Every day | ORAL | 3 refills | Status: DC

## 2017-03-06 MED ORDER — GABAPENTIN 100 MG PO CAPS: 200.0000 mg | ORAL_CAPSULE | Freq: Every day | ORAL | 3 refills | Status: DC

## 2017-03-06 MED FILL — ?OMEPRAZOLE DR 20 MG CAPSUL: 20 | 30 days supply | Qty: 30 | Fill #0

## 2017-03-06 MED FILL — GABAPENTIN 100 MG CAPSULE: 100 | 30 days supply | Qty: 60 | Fill #0

## 2017-03-06 NOTE — Progress Notes (Signed)
Patient ID: Natalie Thornton, female    DOB: 1985/10/08  MRN: [id number]  CC: Abdominal Pain   Subjective: Natalie Thornton is a 31 y.o. female who presents for UC visit. Female friend is with her. 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 C/o abd pain x 1 yr -gets achy feeling in mid abd and LUQ every day.  -only eggs upset stomach. She likes spicy foods. "I get full fast." Wgh fluctuates.  +N/V "just out the blue." Vomited 2 x in a day last wk.  -+ constipation. Has to take a suppository daily for BMs. No problems swallowing -"I drink a lot, sometimes I go over board." Can drink a pint of liquor a day 5 days a wk. "I drink to numb it up and sometimes I just drink. I can quit if I decide to quit."  Had normal colonoscopy 08/2016. Test + for H.pylori earlier this yr and was treated.  Had MRI of abd last yr that revealed: IMPRESSION: Multiple small benign hepatic lesions, including focal nodular hyperplasia, hemangioma, and cysts. No evidence of malignancy. No acute findings.   2. Back pain: Cymbalta made her sick to stomach. -uses Tylenol and Ibuprofen every now and then.  -not sleeping well due to pain and use ETOH to "numb the pain." Patient Active Problem List   Diagnosis Date Noted  . Diabetes mellitus screening 01/06/2017  . Tobacco abuse 01/06/2017  . Anxiety and depression 01/06/2017  . Dyspnea on exertion 10/20/2016  . Cigarette smoker 10/20/2016  . Solitary pulmonary nodule 10/20/2016  . Acute contact dermatitis 10/20/2016  . Pilonidal sinus 07/05/2011     Current Outpatient Prescriptions on File Prior to Visit  Medication Sig Dispense Refill  . bismuth-metronidazole-tetracycline (PYLERA) 140-125-125 MG capsule Take 3 capsules by mouth 4 (four) times daily -  before meals and at bedtime.    . diclofenac sodium (VOLTAREN) 1 % GEL Apply 2 g topically 4 (four) times daily. 100 g 2  .  methocarbamol (ROBAXIN) 500 MG tablet Take 1 tablet (500 mg total) by mouth 2 (two) times daily. 20 tablet 0   No current facility-administered medications on file prior to visit.     No Known Allergies  Social History   Social History  . Marital status: Married    Spouse name: N/A  . Number of children: 2  . Years of education: N/A   Occupational History  . Not on file.   Social History Main Topics  . Smoking status: Current Every Day Smoker    Packs/day: 5.00    Years: 10.00    Types: Cigars  . Smokeless tobacco: Never Used  . Alcohol use Yes     Comment: occasionally  . Drug use: No  . Sexual activity: Not on file   Other Topics Concern  . Not on file   Social History Narrative  . No narrative on file    Family History  Problem Relation Age of Onset  . Cancer Maternal Grandmother        breast  . AAA (abdominal aortic aneurysm) Maternal Grandmother   . Leukemia Maternal Grandmother   . Head & neck cancer Paternal Grandmother        Back cancer  . Lymphoma Paternal Grandmother   . Cirrhosis Maternal Uncle   . Breast cancer Other   . Colon cancer Neg Hx   . Esophageal cancer Neg Hx   .  Rectal cancer Neg Hx   . Stomach cancer Neg Hx     Past Surgical History:  Procedure Laterality Date  . COLONOSCOPY    . NO PAST SURGERIES      ROS: Review of Systems negative except as stated above   PHYSICAL EXAM: BP 133/82   Pulse 77   Temp 99.7 F (37.6 C) (Oral)   Resp 16   Wt 148 lb 6.4 oz (67.3 kg)   SpO2 99%   BMI 29.97 kg/m   Physical Exam  General appearance - alert, well appearing, and in no distress Mental status - alert, oriented to person, place, and time, normal mood, behavior, speech, dress, motor activity, and thought processes Abdomen - soft, nontender, nondistended, no masses or organomegaly   ASSESSMENT AND PLAN: 1. Abdominal pain, unspecified abdominal location Advise patient to cut back on the heavy drinking as this can cause liver  issues in the future and can also cause gastritis. Will start her on omeprazole and check H. pylori breath test. - Hepatic Function Panel - Lipase - omeprazole (PRILOSEC) 20 MG capsule; Take 1 capsule (20 mg total) by mouth daily.  Dispense: 30 capsule; Refill: 3 - H. pylori breath test  2. Alcohol use disorder, moderate, dependence ([place]) Patient does not feel she has a problem with alcohol but she is drinking in excess. Her female friend who is with her today agrees with this. -Patient is willing to cut back significantly. She declined saying LCSW or considering going to [place] meetings  3. Chronic back pain, unspecified back location, unspecified back pain laterality Stop Cymbalta. Will try her with gabapentin - gabapentin (NEURONTIN) 100 MG capsule; Take 2 capsules (200 mg total) by mouth at bedtime.  Dispense: 60 capsule; Refill: 3  Patient was given the opportunity to ask questions.  Patient verbalized understanding of the plan and was able to repeat key elements of the plan.   Orders Placed This Encounter  Procedures  . Hepatic Function Panel  . Lipase  . H. pylori breath test     Requested Prescriptions   Signed Prescriptions Disp Refills  . omeprazole (PRILOSEC) 20 MG capsule 30 capsule 3    Sig: Take 1 capsule (20 mg total) by mouth daily.  Marland Kitchen gabapentin (NEURONTIN) 100 MG capsule 60 capsule 3    Sig: Take 2 capsules (200 mg total) by mouth at bedtime.    Return if symptoms worsen or fail to improve.  Karle Plumber, MD, FACP

## 2017-03-06 NOTE — Patient Instructions (Signed)
Cut back on amount that you drink.   Start Gabapentin at bed time to help with back pain and sleep.

## 2017-03-07 LAB — HEPATIC FUNCTION PANEL
ALBUMIN: 4.5 g/dL (ref 3.5–5.5)
ALT: 9 IU/L (ref 0–32)
AST: 19 IU/L (ref 0–40)
Alkaline Phosphatase: 41 IU/L (ref 39–117)
BILIRUBIN TOTAL: 0.2 mg/dL (ref 0.0–1.2)
Bilirubin, Direct: 0.08 mg/dL (ref 0.00–0.40)
Total Protein: 6.8 g/dL (ref 6.0–8.5)

## 2017-03-07 LAB — LIPASE: Lipase: 29 U/L (ref 14–72)

## 2017-03-08 LAB — H. PYLORI BREATH TEST: H pylori Breath Test: POSITIVE — AB

## 2017-03-10 ENCOUNTER — Other Ambulatory Visit: Payer: Self-pay | Admitting: Internal Medicine

## 2017-03-10 MED ORDER — OMEPRAZOLE 20 MG PO CPDR: 20.0000 mg | DELAYED_RELEASE_CAPSULE | Freq: Two times a day (BID) | ORAL | 0 refills | Status: DC

## 2017-03-10 MED ORDER — AMOXICILLIN 500 MG PO CAPS: 1000.0000 mg | ORAL_CAPSULE | Freq: Two times a day (BID) | ORAL | 0 refills | Status: AC

## 2017-03-10 MED ORDER — CLARITHROMYCIN 500 MG PO TABS: 500.0000 mg | ORAL_TABLET | Freq: Two times a day (BID) | ORAL | 0 refills | Status: DC

## 2017-03-10 NOTE — Progress Notes (Signed)
biaxin

## 2017-03-14 ENCOUNTER — Other Ambulatory Visit: Payer: Self-pay

## 2017-03-14 ENCOUNTER — Encounter: Payer: Self-pay | Admitting: Physician Assistant

## 2017-03-14 ENCOUNTER — Ambulatory Visit (INDEPENDENT_AMBULATORY_CARE_PROVIDER_SITE_OTHER): Payer: Self-pay | Admitting: Physician Assistant

## 2017-03-14 ENCOUNTER — Telehealth: Payer: Self-pay | Admitting: Physician Assistant

## 2017-03-14 VITALS — BP 100/70 | HR 80 | Ht 59.0 in | Wt 144.0 lb

## 2017-03-14 DIAGNOSIS — R1013 Epigastric pain: Secondary | ICD-10-CM

## 2017-03-14 DIAGNOSIS — Z791 Long term (current) use of non-steroidal anti-inflammatories (NSAID): Secondary | ICD-10-CM

## 2017-03-14 DIAGNOSIS — R1012 Left upper quadrant pain: Secondary | ICD-10-CM

## 2017-03-14 DIAGNOSIS — G8929 Other chronic pain: Secondary | ICD-10-CM

## 2017-03-14 DIAGNOSIS — R110 Nausea: Secondary | ICD-10-CM

## 2017-03-14 DIAGNOSIS — R1011 Right upper quadrant pain: Secondary | ICD-10-CM

## 2017-03-14 DIAGNOSIS — K5909 Other constipation: Secondary | ICD-10-CM

## 2017-03-14 MED ORDER — PANTOPRAZOLE SODIUM 40 MG PO TBEC: 40.0000 mg | DELAYED_RELEASE_TABLET | Freq: Every day | ORAL | 2 refills | Status: DC

## 2017-03-14 MED ORDER — POLYETHYLENE GLYCOL 3350 17 GM/SCOOP PO POWD: ORAL | 3 refills | Status: DC

## 2017-03-14 MED ORDER — POLYETHYLENE GLYCOL 3350 17 GM/SCOOP PO POWD: 1.0000 | Freq: Every day | ORAL | 3 refills | Status: DC

## 2017-03-14 NOTE — Progress Notes (Signed)
Assessment and plans reviewed  

## 2017-03-14 NOTE — Telephone Encounter (Signed)
The pharmacist asked me to resend the prescription for the polyethylene Glycol, which I did.

## 2017-03-14 NOTE — Patient Instructions (Addendum)
Please go to the basement level to our lab for a stool test. We have sent the following medications to your pharmacy for you to pick up at your convenience: [place]. 1. Pantoprazole sodium 40 mg. 2. Generic Miralax- Take 17 grams in 8 oz of water daily.     Stop Aleve, Advil, Ibuprofen.  Stop Alcohol.   You have been scheduled for an endoscopy. Please follow written instructions given to you at your visit today. If you use inhalers (even only as needed), please bring them with you on the day of your procedure. Your physician has requested that you go to www.startemmi.com and enter the access code given to you at your visit today. This web site gives a general overview about your procedure. However, you should still follow specific instructions given to you by our office regarding your preparation for the procedure.

## 2017-03-14 NOTE — Progress Notes (Signed)
Subjective:    Patient ID: Natalie Thornton, female    DOB: 1986-05-03, 31 y.o.   MRN: [id number]  HPI Natalie Thornton is a pleasant 31 year old African-American female, known to Dr. Henrene Pastor and myself. She was last seen here in February 2018 with complaints of upper abdominal pain. She was H. pylori positive at that time and treated. She also has known focal nodular hyperplasia on MRI dated December 2017. Patient also with history of chronic constipation, anxiety and previous history of EtOH abuse.  She is referred back today by Dr. Marlou Starks after being evaluated for complaints of rather generalized abdominal pain. Patient says that she's been having ongoing problems with mid and upper abdominal pain since she was seen in our office last. She was given a trial of Amitiza for constipation but says it caused abdominal pain so she stopped it. She does admit that she's been drinking alcohol again, usually about 3 bottles per week of Bacardi.  She has also been using Aleve on a regular basis 3-4 tablets per day for abdominal pain. She continues to complain of constipation, uses suppositories on an as-needed basis. Patient states abdominal pain is present every day but comes and goes throughout the day. She describes it as a bloated and achy.  Sometimes she feels worse after eating but not always She has had some nausea and an occasional episode of vomiting.   Weight overall stable. Patient had colonoscopy done in March 2018 per Dr. Henrene Pastor which was a normal exam. MRI of the abdomen in December 2017 negative with the exception of the 1.4 cm area of [place] and one small hepatic hemangioma.  Review of Systems Pertinent positive and negative review of systems were noted in the above HPI section.  All other review of systems was otherwise negative.  Outpatient Encounter Prescriptions as of 03/14/2017  Medication Sig  . amoxicillin (AMOXIL) 500 MG capsule Take 2 capsules (1,000 mg total) by mouth 2 (two) times daily.  .  clarithromycin (BIAXIN) 500 MG tablet Take 1 tablet (500 mg total) by mouth 2 (two) times daily.  Marland Kitchen gabapentin (NEURONTIN) 100 MG capsule Take 2 capsules (200 mg total) by mouth at bedtime.  Marland Kitchen omeprazole (PRILOSEC) 20 MG capsule Take 20 mg by mouth daily.  . [DISCONTINUED] omeprazole (PRILOSEC) 20 MG capsule Take 1 capsule (20 mg total) by mouth 2 (two) times daily before a meal. (Patient taking differently: Take 20 mg by mouth daily. )  . pantoprazole (PROTONIX) 40 MG tablet Take 1 tablet (40 mg total) by mouth daily.  . polyethylene glycol powder (GLYCOLAX/MIRALAX) powder Take by mouth, 17 grams in 8 oz of water daily .  . [DISCONTINUED] bismuth-metronidazole-tetracycline (PYLERA) 140-125-125 MG capsule Take 3 capsules by mouth 4 (four) times daily -  before meals and at bedtime.  . [DISCONTINUED] diclofenac sodium (VOLTAREN) 1 % GEL Apply 2 g topically 4 (four) times daily.  . [DISCONTINUED] methocarbamol (ROBAXIN) 500 MG tablet Take 1 tablet (500 mg total) by mouth 2 (two) times daily.  . [DISCONTINUED] omeprazole (PRILOSEC) 20 MG capsule Take 1 capsule (20 mg total) by mouth daily.  . [DISCONTINUED] polyethylene glycol powder (GLYCOLAX/MIRALAX) powder Take 255 g by mouth daily.   No facility-administered encounter medications on file as of 03/14/2017.    No Known Allergies Patient Active Problem List   Diagnosis Date Noted  . Diabetes mellitus screening 01/06/2017  . Tobacco abuse 01/06/2017  . Anxiety and depression 01/06/2017  . Dyspnea on exertion 10/20/2016  . Cigarette smoker  10/20/2016  . Solitary pulmonary nodule 10/20/2016  . Acute contact dermatitis 10/20/2016  . 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 Marital status: Married    Spouse name: N/A  . Number of children: 2  . Years of education: N/A   Occupational History  . [employer]     Social History Main Topics  . Smoking status: Current Every Day Smoker    Packs/day: 5.00    Years:  10.00    Types: Cigars  . Smokeless tobacco: Never Used     Comment: tobacco info given 03/14/17  . Alcohol use Yes     Comment: occasionally  . Drug use: No  . Sexual activity: Not on file   Other Topics Concern  . Not on file   Social History Narrative  . No narrative on file    Natalie Thornton's family history includes AAA (abdominal aortic aneurysm) in her maternal grandmother; Breast cancer in her maternal grandmother and other; Cirrhosis in her maternal uncle; Head & neck cancer in her paternal grandmother; Leukemia in her maternal grandmother; Lymphoma in her paternal grandmother.      Objective:    Vitals:   03/14/17 1015  BP: 100/70  Pulse: 80    Physical Exam   well-developed young African-American female in no acute distress, Height 4 foot 11, weight 144, BMI 29.0. HEENT; nontraumatic normocephalic EOMI PERRLA sclera anicteric, Cardiovascular; regular rate and rhythm with S1-S2 no murmur or gallop, Pulmonary; clear bilaterally, Abdomen soft, she has mild tenderness in the epigastrium there is no guarding or rebound no palpable mass or hepatosplenomegaly, bowel sounds are present, Rectal ;exam not done, Extremities ;no clubbing cyanosis or edema skin warm and dry total tattoos, Neuropsych; mood and affect appropriate       Assessment & Plan:   #68 31 year old female with multiple month history of mid and upper abdominal pain, present daily but intermittent. Negative colonoscopy March 2018. Patient has been using NSAIDs on a daily basis and also drinking alcohol daily-suspect acute/chronic gastropathy gastritis or peptic ulcer disease Patient was H. pylori positive and treated February 2018, she recently did a breath test that was documented as positive # 2 chronic constipation #3 anxiety #4 FNH . 1.4 cm area on MRI December 2017  Plan; Patient will be scheduled for upper endoscopy with Dr. Henrene Pastor. Procedure discussed in detail with patient including risk and benefits and  she is agreeable to proceed and She was advised to stop all NSAID use Advised to stop EtOH Start Protonix 40 mg by mouth every morning Start MiraLAX 17 g in 8 ounces of water every day Check H. pylori stool antigen and treat if positive (patient was treated February 2018)  Natalie Thornton Genia Harold PA-C 03/14/2017   Cc: Ladell Pier, MD

## 2017-03-15 ENCOUNTER — Other Ambulatory Visit: Payer: Self-pay

## 2017-03-15 ENCOUNTER — Ambulatory Visit (AMBULATORY_SURGERY_CENTER): Payer: Self-pay | Admitting: Internal Medicine

## 2017-03-15 ENCOUNTER — Telehealth: Payer: Self-pay

## 2017-03-15 ENCOUNTER — Encounter: Payer: Self-pay | Admitting: Internal Medicine

## 2017-03-15 ENCOUNTER — Encounter: Payer: Self-pay | Admitting: Physician Assistant

## 2017-03-15 VITALS — BP 136/77 | HR 69 | Temp 97.1°F | Resp 13 | Ht 59.0 in | Wt 144.0 lb

## 2017-03-15 DIAGNOSIS — R1011 Right upper quadrant pain: Secondary | ICD-10-CM

## 2017-03-15 DIAGNOSIS — R1012 Left upper quadrant pain: Secondary | ICD-10-CM

## 2017-03-15 DIAGNOSIS — R1013 Epigastric pain: Secondary | ICD-10-CM

## 2017-03-15 DIAGNOSIS — K5909 Other constipation: Secondary | ICD-10-CM

## 2017-03-15 DIAGNOSIS — K222 Esophageal obstruction: Secondary | ICD-10-CM

## 2017-03-15 DIAGNOSIS — G8929 Other chronic pain: Secondary | ICD-10-CM

## 2017-03-15 DIAGNOSIS — R110 Nausea: Secondary | ICD-10-CM

## 2017-03-15 MED ORDER — SODIUM CHLORIDE 0.9 % IV SOLN: 500.0000 mL | INTRAVENOUS | Status: DC

## 2017-03-15 NOTE — Progress Notes (Signed)
To PACU, VSS. Report to [email or web address] 

## 2017-03-15 NOTE — Patient Instructions (Signed)
YOU HAD AN ENDOSCOPIC PROCEDURE TODAY AT [facility]:   Refer to the procedure report that was given to you for any specific questions about what was found during the examination.  If the procedure report does not answer your questions, please call your gastroenterologist to clarify.  If you requested that your care partner not be given the details of your procedure findings, then the procedure report has been included in a sealed envelope for you to review at your convenience later.  YOU SHOULD EXPECT: Some feelings of bloating in the abdomen. Passage of more gas than usual.  Walking can help get rid of the air that was put into your GI tract during the procedure and reduce the bloating. If you had a lower endoscopy (such as a colonoscopy or flexible sigmoidoscopy) you may notice spotting of blood in your stool or on the toilet paper. If you underwent a bowel prep for your procedure, you may not have a normal bowel movement for a few days.  Please Note:  You might notice some irritation and congestion in your nose or some drainage.  This is from the oxygen used during your procedure.  There is no need for concern and it should clear up in a day or so.  SYMPTOMS TO REPORT IMMEDIATELY:   Following lower endoscopy (colonoscopy or flexible sigmoidoscopy):  Excessive amounts of blood in the stool  Significant tenderness or worsening of abdominal pains  Swelling of the abdomen that is new, acute  Fever of 100F or higher   Following upper endoscopy (EGD)  Vomiting of blood or coffee ground material  New chest pain or pain under the shoulder blades  Painful or persistently difficult swallowing  New shortness of breath  Fever of 100F or higher  Black, tarry-looking stools  For urgent or emergent issues, a gastroenterologist can be reached at any hour by calling [phone].   DIET:  We do recommend a small meal at first, but then you may proceed to your regular diet.  Drink  plenty of fluids but you should avoid alcoholic beverages for 24 hours.  ACTIVITY:  You should plan to take it easy for the rest of today and you should NOT DRIVE or use heavy machinery until tomorrow (because of the sedation medicines used during the test).    FOLLOW UP: Our staff will call the number listed on your records the next business day following your procedure to check on you and address any questions or concerns that you may have regarding the information given to you following your procedure. If we do not reach you, we will leave a message.  However, if you are feeling well and you are not experiencing any problems, there is no need to return our call.  We will assume that you have returned to your regular daily activities without incident.  If any biopsies were taken you will be contacted by phone or by letter within the next 1-3 weeks.  Please call us at [phone] if you have not heard about the biopsies in 3 weeks.    SIGNATURES/CONFIDENTIALITY: You and/or your care partner have signed paperwork which will be entered into your electronic medical record.  These signatures attest to the fact that that the information above on your After Visit Summary has been reviewed and is understood.  Full responsibility of the confidentiality of this discharge information lies with you and/or your care-partner.   Avoid alcohol and anti flammatories for the time being.  Start Pantoprazole 40mg . Daily as ordered yesterday.  See Amy or Dr. Henrene Pastor in 4 weeks.

## 2017-03-15 NOTE — Op Note (Signed)
[facility] Patient Name: Natalie Thornton Procedure Date: 03/15/2017 4:24 PM MRN: [id number] Endoscopist: Docia Chuck. Henrene Pastor , MD Age: 31 Referring MD:  Date of Birth: [birth year] Gender: Female Account #: [id number] Procedure:                Upper GI endoscopy with biopsies Indications:              Epigastric abdominal pain Medicines:                Monitored Anesthesia Care Procedure:                Pre-Anesthesia Assessment:                           - Prior to the procedure, a History and Physical                            was performed, and patient medications and                            allergies were reviewed. The patient's tolerance of                            previous anesthesia was also reviewed. The risks                            and benefits of the procedure and the sedation                            options and risks were discussed with the patient.                            All questions were answered, and informed consent                            was obtained. Prior Anticoagulants: The patient has                            taken no previous anticoagulant or antiplatelet                            agents. ASA Grade Assessment: II - A patient with                            mild systemic disease. After reviewing the risks                            and benefits, the patient was deemed in                            satisfactory condition to undergo the procedure.                           After obtaining informed consent, the endoscope was  passed under direct vision. Throughout the                            procedure, the patient's blood pressure, pulse, and                            oxygen saturations were monitored continuously. The                            Model GIF-[id number]) scope was introduced                            through the mouth, and advanced to the second part                            of duodenum.  The upper GI endoscopy was                            accomplished without difficulty. The patient                            tolerated the procedure well. Scope In: Scope Out: Findings:                 A mild Schatzki ring (acquired) was found at the                            gastroesophageal junction.                           The esophagus was normal.                           The stomach was normal, save small hiatal hernia.                            Biopsies were taken with a cold forceps for                            Helicobacter pylori testing using CLOtest.                           The examined duodenum was normal.                           The cardia and gastric fundus were normal on                            retroflexion. Complications:            No immediate complications. Estimated Blood Loss:     Estimated blood loss: none. Impression:               - Mild Schatzki ring.                           - Normal esophagus.                           -  Normal stomach. Biopsied.                           - Normal examined duodenum. Recommendation:           1. Avoid alcohol and anti-inflammatories for the                            time being                           2. Begin pantoprazole 40 mg daily, which was                            prescribed yesterday                           3. Use MiraLAX as previously recommended for                            chronic constipation and bloating                           4. Follow-up biopsy                           5. Office follow-up with Amy or Dr. Henrene Pastor in about                            4 weeks. Docia Chuck. Henrene Pastor, MD 03/15/2017 4:50:45 PM This report has been signed electronically.

## 2017-03-15 NOTE — Progress Notes (Signed)
Called to room to assist during endoscopic procedure.  Patient ID and intended procedure confirmed with present staff. Received instructions for my participation in the procedure from the performing physician.  

## 2017-03-16 ENCOUNTER — Telehealth: Payer: Self-pay | Admitting: *Deleted

## 2017-03-16 NOTE — Telephone Encounter (Signed)
No answer, message left for the patient. 

## 2017-03-17 LAB — H. PYLORI ANTIGEN, STOOL: H PYLORI AG STL: POSITIVE — AB

## 2017-03-17 LAB — HELICOBACTER PYLORI SCREEN-BIOPSY: UREASE: POSITIVE — AB

## 2017-03-20 ENCOUNTER — Other Ambulatory Visit: Payer: Self-pay

## 2017-03-20 ENCOUNTER — Encounter: Payer: Self-pay | Admitting: Physician Assistant

## 2017-03-20 ENCOUNTER — Telehealth: Payer: Self-pay

## 2017-03-20 ENCOUNTER — Telehealth: Payer: Self-pay | Admitting: Internal Medicine

## 2017-03-20 ENCOUNTER — Telehealth: Payer: Self-pay | Admitting: Physician Assistant

## 2017-03-20 NOTE — Telephone Encounter (Signed)
See result note.  

## 2017-03-20 NOTE — Telephone Encounter (Signed)
-----   Message from Alfredia Ferguson, PA-C sent at 03/20/2017  1:55 PM EDT ----- Please call pt and let her know the Hpylori stool Ag is positive so she still has hpylori - not sure if she took all of the antibiotics last winter when treated - make sure she know she has to complete the whole course- please send a Prevpak , or if insurance wont cover that then amoxiciilin 500mg  tabs, 2 po bid x 14 days and clarithromycin 500 mg po bid x 14 days , and whichever PPI she is already on x 14 days. She is on a PPI - so if doing prevpak , have her stopp other PPI while on Prevpak, then resume after finishes abx

## 2017-03-20 NOTE — Telephone Encounter (Signed)
Spoke with the patient. Discussed in detail the need for treatment due to her positive H pylori tests. She has also sent patient messages which have been addressed.  The patient has been instructed to call her pharmacy at [facility]. There had been Amox, Biaxin and Omeprazole sent in by her provider at her PCP on 03/10/17 for a positive H-Pylori breath test which she had not picked up. These prescriptions were re-shelved today. She also has Pantoprazole and Miralax on hold. Pharmacist will cancel the Pantoprazole prescriptions. She asks that the patient call and let the pharmacy know she will pick up the Amoxicillin, Biaxin and Omeprazole before they will fill them again. Otherwise they will be placed on hold. I have left a voicemail for Natalie Thornton and I have sent her an email with this information.

## 2017-03-20 NOTE — Telephone Encounter (Signed)
Left a message to call back.

## 2017-03-24 NOTE — Telephone Encounter (Signed)
I entered this in error, no contact was made to this patient from myself.

## 2017-04-07 ENCOUNTER — Encounter: Payer: Self-pay | Admitting: Nurse Practitioner

## 2017-04-07 ENCOUNTER — Ambulatory Visit: Payer: Self-pay | Admitting: Physician Assistant

## 2017-04-14 ENCOUNTER — Other Ambulatory Visit: Payer: Self-pay

## 2017-04-14 ENCOUNTER — Encounter (HOSPITAL_COMMUNITY): Payer: Self-pay

## 2017-04-14 ENCOUNTER — Emergency Department (HOSPITAL_COMMUNITY)
Admission: EM | Admit: 2017-04-14 | Discharge: 2017-04-15 | Disposition: A | Discharge status: Home / Self Care | Payer: Self-pay | Attending: Emergency Medicine | Admitting: Emergency Medicine

## 2017-04-14 ENCOUNTER — Emergency Department (HOSPITAL_COMMUNITY)
Admission: EM | Admit: 2017-04-14 | Discharge: 2017-04-14 | Disposition: A | Discharge status: Home / Self Care | Payer: Self-pay | Attending: Emergency Medicine | Admitting: Emergency Medicine

## 2017-04-14 DIAGNOSIS — Z5321 Procedure and treatment not carried out due to patient leaving prior to being seen by health care provider: Secondary | ICD-10-CM | POA: Insufficient documentation

## 2017-04-14 DIAGNOSIS — F17210 Nicotine dependence, cigarettes, uncomplicated: Secondary | ICD-10-CM | POA: Insufficient documentation

## 2017-04-14 DIAGNOSIS — Z79899 Other long term (current) drug therapy: Secondary | ICD-10-CM | POA: Insufficient documentation

## 2017-04-14 DIAGNOSIS — R1084 Generalized abdominal pain: Secondary | ICD-10-CM | POA: Insufficient documentation

## 2017-04-14 DIAGNOSIS — R109 Unspecified abdominal pain: Secondary | ICD-10-CM | POA: Insufficient documentation

## 2017-04-14 HISTORY — DX: Helicobacter pylori (H. pylori) as the cause of diseases classified elsewhere: B96.81

## 2017-04-14 HISTORY — DX: Helicobacter pylori (H. pylori) as the cause of diseases classified elsewhere: K27.9

## 2017-04-14 LAB — COMPREHENSIVE METABOLIC PANEL
ALT: 11 U/L — AB (ref 14–54)
AST: 21 U/L (ref 15–41)
Albumin: 3.8 g/dL (ref 3.5–5.0)
Alkaline Phosphatase: 39 U/L (ref 38–126)
Anion gap: 5 (ref 5–15)
BILIRUBIN TOTAL: 0.5 mg/dL (ref 0.3–1.2)
BUN: 6 mg/dL (ref 6–20)
CHLORIDE: 105 mmol/L (ref 101–111)
CO2: 28 mmol/L (ref 22–32)
CREATININE: 1.05 mg/dL — AB (ref 0.44–1.00)
Calcium: 9 mg/dL (ref 8.9–10.3)
Glucose, Bld: 92 mg/dL (ref 65–99)
POTASSIUM: 3.8 mmol/L (ref 3.5–5.1)
SODIUM: 138 mmol/L (ref 135–145)
TOTAL PROTEIN: 6.3 g/dL — AB (ref 6.5–8.1)

## 2017-04-14 LAB — URINALYSIS, ROUTINE W REFLEX MICROSCOPIC
Bilirubin Urine: NEGATIVE
GLUCOSE, UA: NEGATIVE mg/dL
Hgb urine dipstick: NEGATIVE
KETONES UR: NEGATIVE mg/dL
LEUKOCYTES UA: NEGATIVE
NITRITE: NEGATIVE
PROTEIN: NEGATIVE mg/dL
Specific Gravity, Urine: 1.009 (ref 1.005–1.030)
pH: 8 (ref 5.0–8.0)

## 2017-04-14 LAB — CBC
HCT: 39.7 % (ref 36.0–46.0)
Hemoglobin: 13.2 g/dL (ref 12.0–15.0)
MCH: 30.8 pg (ref 26.0–34.0)
MCHC: 33.2 g/dL (ref 30.0–36.0)
MCV: 92.8 fL (ref 78.0–100.0)
PLATELETS: 254 10*3/uL (ref 150–400)
RBC: 4.28 MIL/uL (ref 3.87–5.11)
RDW: 12.8 % (ref 11.5–15.5)
WBC: 5.7 10*3/uL (ref 4.0–10.5)

## 2017-04-14 LAB — LIPASE, BLOOD: LIPASE: 37 U/L (ref 11–51)

## 2017-04-14 LAB — I-STAT BETA HCG BLOOD, ED (MC, WL, AP ONLY)

## 2017-04-14 NOTE — ED Triage Notes (Signed)
Pt endorses abd pain that has been going on for months with intermittent n/v/d. Pt was diagnosed with h. Pylori in august and has not been able to get back in to see her GI Dr because she is out. VSS.

## 2017-04-14 NOTE — ED Triage Notes (Signed)
Pt was here earlier but left due to the wait time. Pt endorses abd pain for several months with intermittent n/v/d, pt diagnosed in august with h. Pylori and not been able to get back to see her GI dr. VSS.

## 2017-04-14 NOTE — Care Management (Signed)
ED CM noted patient to have had 7 ED visits in the past 6 months for similar complaints.  Patient is followed at the [facility] and was diagnoses with H Pylori. Prescriptions for Amox, Biaxin and Omeprazole were sent to [facility]. Patient was informed VM, email that medications are awaiting pick up, but has not picked them up according to nurses telephone note on 10/15.

## 2017-04-14 NOTE — ED Notes (Signed)
Patient did not answer when called x 3

## 2017-04-15 ENCOUNTER — Encounter: Payer: Self-pay | Admitting: Nurse Practitioner

## 2017-04-15 ENCOUNTER — Encounter: Payer: Self-pay | Admitting: Internal Medicine

## 2017-04-15 ENCOUNTER — Encounter: Payer: Self-pay | Admitting: Physician Assistant

## 2017-04-15 ENCOUNTER — Emergency Department (HOSPITAL_COMMUNITY): Payer: Self-pay

## 2017-04-15 ENCOUNTER — Encounter (HOSPITAL_COMMUNITY): Payer: Self-pay

## 2017-04-15 MED ORDER — IOPAMIDOL (ISOVUE-300) INJECTION 61%
INTRAVENOUS | Status: AC
  Filled 2017-04-15: qty 30

## 2017-04-15 MED ORDER — TRAMADOL HCL 50 MG PO TABS: 50.0000 mg | ORAL_TABLET | Freq: Four times a day (QID) | ORAL | 0 refills | Status: DC | PRN

## 2017-04-15 MED ORDER — IOPAMIDOL (ISOVUE-300) INJECTION 61%
INTRAVENOUS | Status: AC
  Administered 2017-04-15: 100 mL via INTRAVENOUS
  Filled 2017-04-15: qty 100

## 2017-04-15 MED ORDER — SODIUM CHLORIDE 0.9 % IV BOLUS (SEPSIS)
1000.0000 mL | Freq: Once | INTRAVENOUS | Status: AC
  Administered 2017-04-15: 1000 mL via INTRAVENOUS

## 2017-04-15 MED ORDER — TRAMADOL HCL 50 MG PO TABS
50.0000 mg | ORAL_TABLET | Freq: Once | ORAL | Status: AC
  Administered 2017-04-15: 50 mg via ORAL
  Filled 2017-04-15: qty 1

## 2017-04-15 MED ORDER — OMEPRAZOLE 20 MG PO CPDR: 20.0000 mg | DELAYED_RELEASE_CAPSULE | Freq: Two times a day (BID) | ORAL | 0 refills | Status: DC

## 2017-04-15 MED ORDER — ONDANSETRON HCL 4 MG/2ML IJ SOLN
4.0000 mg | Freq: Once | INTRAMUSCULAR | Status: AC
  Administered 2017-04-15: 4 mg via INTRAVENOUS
  Filled 2017-04-15: qty 2

## 2017-04-15 MED ORDER — MORPHINE SULFATE (PF) 4 MG/ML IV SOLN
4.0000 mg | Freq: Once | INTRAVENOUS | Status: AC
  Administered 2017-04-15: 4 mg via INTRAVENOUS
  Filled 2017-04-15: qty 1

## 2017-04-15 NOTE — ED Provider Notes (Signed)
[facility] EMERGENCY DEPARTMENT Provider Note   CSN: [id number] Arrival date & time: 04/14/17  2213     History   Chief Complaint Chief Complaint  Patient presents with  . Abdominal Pain    HPI Natalie Thornton is a 31 y.o. female.  Patient is a 31 year old female with past medical history of ulcers related to H. pylori diagnosed by endoscopy.  She presents today for evaluation of worsening abdominal pain.  This is apparently been ongoing for many months, however has significantly worsened over the past week.  She denies any fevers or chills.  She denies any vomiting or diarrhea.  Her last menstrual period was last month and normal.  She denies the possibility of pregnancy.   The history is provided by the patient.  Abdominal Pain   This is a chronic problem. Episode onset: Several months ago. The problem occurs constantly. The problem has been gradually worsening. The pain is associated with eating. The pain is located in the generalized abdominal region. The quality of the pain is cramping. The pain is moderate. Pertinent negatives include fever, melena, constipation and dysuria. The symptoms are aggravated by eating, activity and certain positions. Nothing relieves the symptoms. Her past medical history is significant for PUD.    Past Medical History:  Diagnosis Date  . Anxiety   . Arthritis   . DDD (degenerative disc disease)   . Degenerative disc disease, cervical   . Disc disease, degenerative, cervical   . H pylori ulcer   . Pilonidal cyst     Patient Active Problem List   Diagnosis Date Noted  . Diabetes mellitus screening 01/06/2017  . Tobacco abuse 01/06/2017  . Anxiety and depression 01/06/2017  . Dyspnea on exertion 10/20/2016  . Cigarette smoker 10/20/2016  . Solitary pulmonary nodule 10/20/2016  . Acute contact dermatitis 10/20/2016  . Pilonidal sinus 07/05/2011    Past Surgical History:  Procedure Laterality Date  . COLONOSCOPY        OB History    No data available       Home Medications    Prior to Admission medications   Medication Sig Start Date End Date Taking? Authorizing Provider  clarithromycin (BIAXIN) 500 MG tablet Take 1 tablet (500 mg total) by mouth 2 (two) times daily. Patient not taking: Reported on 03/15/2017 03/10/17   Ladell Pier, MD  gabapentin (NEURONTIN) 100 MG capsule Take 2 capsules (200 mg total) by mouth at bedtime. 03/06/17   Ladell Pier, MD  omeprazole (PRILOSEC) 20 MG capsule Take 20 mg by mouth daily.    [provider]  polyethylene glycol powder (GLYCOLAX/MIRALAX) powder Take by mouth, 17 grams in 8 oz of water daily . 03/14/17   Esterwood, Amy S, PA-C    Family History Family History  Problem Relation Age of Onset  . AAA (abdominal aortic aneurysm) Maternal Grandmother   . Leukemia Maternal Grandmother   . Breast cancer Maternal Grandmother   . Head & neck cancer Paternal Grandmother        Back cancer  . Lymphoma Paternal Grandmother   . Cirrhosis Maternal Uncle   . Breast cancer Other   . Colon cancer Neg Hx   . Esophageal cancer Neg Hx   . Rectal cancer Neg Hx   . Stomach cancer Neg Hx     Social History Social History   Tobacco Use  . Smoking status: Current Every Day Smoker    Packs/day: 5.00    Years:  10.00    Pack years: 50.00    Types: Cigars  . Smokeless tobacco: Never Used  . Tobacco comment: tobacco info given 03/14/17  Substance Use Topics  . Alcohol use: Yes    Comment: occasionally  . Drug use: No     Allergies   Patient has no known allergies.   Review of Systems Review of Systems  Constitutional: Negative for fever.  Gastrointestinal: Positive for abdominal pain. Negative for constipation and melena.  Genitourinary: Negative for dysuria.  All other systems reviewed and are negative.    Physical Exam Updated Vital Signs BP (!) 127/91 (BP Location: Right Arm)   Pulse 73   Temp 98.7 F (37.1 C) (Oral)   Resp  16   Ht 4\' 11"  (1.499 m)   Wt 65.8 kg (145 lb)   SpO2 100%   BMI 29.29 kg/m   Physical Exam  Constitutional: She is oriented to person, place, and time. She appears well-developed and well-nourished. No distress.  HENT:  Head: Normocephalic and atraumatic.  Neck: Normal range of motion. Neck supple.  Cardiovascular: Normal rate and regular rhythm. Exam reveals no gallop and no friction rub.  No murmur heard. Pulmonary/Chest: Effort normal and breath sounds normal. No respiratory distress. She has no wheezes.  Abdominal: Soft. Bowel sounds are normal. She exhibits no distension. There is generalized tenderness. There is no rigidity.  There is generalized abdominal tenderness.  There is no rebound or guarding.  Musculoskeletal: Normal range of motion.  Neurological: She is alert and oriented to person, place, and time.  Skin: Skin is warm and dry. She is not diaphoretic.  Nursing note and vitals reviewed.    ED Treatments / Results  Labs (all labs ordered are listed, but only abnormal results are displayed) Labs Reviewed - No data to display  EKG  EKG Interpretation None       Radiology No results found.  Procedures Procedures (including critical care time)  Medications Ordered in ED Medications  sodium chloride 0.9 % bolus 1,000 mL (not administered)  morphine 4 MG/ML injection 4 mg (not administered)  ondansetron (ZOFRAN) injection 4 mg (not administered)     Initial Impression / Assessment and Plan / ED Course  I have reviewed the triage vital signs and the nursing notes.  Pertinent labs & imaging results that were available during my care of the patient were reviewed by me and considered in my medical decision making (see chart for details).  Patient with generalized abdominal pain that has been ongoing for some time, however worse over the past several days.  She has been told she has H. pylori by her gastroenterologist.  Her laboratory studies are reassuring  and CT scan shows no acute intra-abdominal pathology.  I am uncertain as to the exact etiology of her symptoms, however they seem to be getting worse after she stopped her stomach medications.  I will prescribe Prilosec, tramadol, and advised her to follow-up with her gastroenterologist in the near future.  Final Clinical Impressions(s) / ED Diagnoses   Final diagnoses:  None    ED Discharge Orders    None       Veryl Speak, MD 04/15/17 [phone]

## 2017-04-15 NOTE — ED Notes (Signed)
Pt verbalized understanding of d/c instructions and has no further questions. Pt is stable, A&Ox4, VSS.  

## 2017-04-15 NOTE — Discharge Instructions (Signed)
Omeprazole as prescribed.  Tramadol as prescribed as needed for pain.  Follow-up with your gastroenterologist if you are not improving in the next 2-3 days.

## 2017-04-19 ENCOUNTER — Encounter: Payer: Self-pay | Admitting: Nurse Practitioner

## 2017-04-19 ENCOUNTER — Ambulatory Visit (INDEPENDENT_AMBULATORY_CARE_PROVIDER_SITE_OTHER): Payer: Self-pay | Admitting: Nurse Practitioner

## 2017-04-19 ENCOUNTER — Encounter (HOSPITAL_COMMUNITY): Payer: Self-pay | Admitting: Emergency Medicine

## 2017-04-19 VITALS — BP 110/82 | HR 73 | Ht 59.0 in | Wt 146.8 lb

## 2017-04-19 DIAGNOSIS — G8929 Other chronic pain: Secondary | ICD-10-CM

## 2017-04-19 DIAGNOSIS — R197 Diarrhea, unspecified: Secondary | ICD-10-CM

## 2017-04-19 DIAGNOSIS — R109 Unspecified abdominal pain: Secondary | ICD-10-CM

## 2017-04-19 DIAGNOSIS — Z5321 Procedure and treatment not carried out due to patient leaving prior to being seen by health care provider: Secondary | ICD-10-CM | POA: Insufficient documentation

## 2017-04-19 DIAGNOSIS — R112 Nausea with vomiting, unspecified: Secondary | ICD-10-CM

## 2017-04-19 LAB — COMPREHENSIVE METABOLIC PANEL
ALK PHOS: 37 U/L — AB (ref 38–126)
ALT: 11 U/L — ABNORMAL LOW (ref 14–54)
ANION GAP: 8 (ref 5–15)
AST: 19 U/L (ref 15–41)
Albumin: 3.9 g/dL (ref 3.5–5.0)
BUN: 5 mg/dL — ABNORMAL LOW (ref 6–20)
CALCIUM: 9.3 mg/dL (ref 8.9–10.3)
CO2: 24 mmol/L (ref 22–32)
Chloride: 104 mmol/L (ref 101–111)
Creatinine, Ser: 0.85 mg/dL (ref 0.44–1.00)
GFR calc Af Amer: >60 mL/min (ref >60)
GFR calc non Af Amer: >60 mL/min (ref >60)
GLUCOSE: 84 mg/dL (ref 65–99)
POTASSIUM: 3.5 mmol/L (ref 3.5–5.1)
Sodium: 136 mmol/L (ref 135–145)
Total Bilirubin: 0.6 mg/dL (ref 0.3–1.2)
Total Protein: 6.6 g/dL (ref 6.5–8.1)

## 2017-04-19 LAB — URINALYSIS, ROUTINE W REFLEX MICROSCOPIC
BILIRUBIN URINE: NEGATIVE
Glucose, UA: NEGATIVE mg/dL
HGB URINE DIPSTICK: NEGATIVE
Ketones, ur: 5 mg/dL — AB
Leukocytes, UA: NEGATIVE
Nitrite: NEGATIVE
PH: 7 (ref 5.0–8.0)
Protein, ur: NEGATIVE mg/dL
SPECIFIC GRAVITY, URINE: 1.011 (ref 1.005–1.030)

## 2017-04-19 LAB — LIPASE, BLOOD: LIPASE: 31 U/L (ref 11–51)

## 2017-04-19 LAB — CBC
HEMATOCRIT: 40.2 % (ref 36.0–46.0)
HEMOGLOBIN: 13.4 g/dL (ref 12.0–15.0)
MCH: 30.7 pg (ref 26.0–34.0)
MCHC: 33.3 g/dL (ref 30.0–36.0)
MCV: 92 fL (ref 78.0–100.0)
Platelets: 242 10*3/uL (ref 150–400)
RBC: 4.37 MIL/uL (ref 3.87–5.11)
RDW: 12.4 % (ref 11.5–15.5)
WBC: 6.6 10*3/uL (ref 4.0–10.5)

## 2017-04-19 LAB — I-STAT BETA HCG BLOOD, ED (MC, WL, AP ONLY): I-stat hCG, quantitative: <5 m[IU]/mL (ref <5)

## 2017-04-19 MED ORDER — DICYCLOMINE HCL 10 MG PO CAPS: 10.0000 mg | ORAL_CAPSULE | Freq: Three times a day (TID) | ORAL | 0 refills | Status: DC

## 2017-04-19 NOTE — Progress Notes (Signed)
     Chief Complaint:  Abdominal pain   HPI: Patient is a 31 year old female known to Dr. Henrene Thornton.  She has a history of chronic constipation, anxiety, history of alcohol abuse, and chronic abdominal pain. She has seen Natalie Thornton, P.A in out office three times since January of this year. She has been tried on PPI and various agents for constipation. She tested positive for H.pylori and was treated with pylera.  For ongoing issues she ultimately underwent EGD and colonoscopy. EGD was remarkable only for a small hiatal hernia and a Schatzki's ring.but Urease was positive for H. Pylori so she was treated a second time.  Colonoscopy with TI evaluation was normal.   Natalie Thornton was seen in the ED on the ninth of this month for evaluation of worsening abdominal pain.  Chemistry profile unrevealing.  Her CBC was normal.  Pregnancy test negative.  CT scan of the abdomen and pelvis negative for any acute abnormalities but did reveal some hypodense liver lesions, not compatible with cysts.   Natalie Thornton is back with complaints of generalized abdominal pain and frequent vomiting. Pain is intermittent, unrelated to eating. Pain is achy with ocassional stabbing.  and She continues to drink ETOH despite previous recommendations to stop. She is also having diarrhea over the last several days. No fevers. No weight loss, weight is actually up.  She inquires about another MRI to check on liver lesions.   Past Medical History:  Diagnosis Date  . Anxiety   . Arthritis   . DDD (degenerative disc disease)   . Degenerative disc disease, cervical   . Disc disease, degenerative, cervical   . H pylori ulcer   . Pilonidal cyst     Patient's surgical history, family medical history, social history, medications and allergies were all reviewed in Epic    Physical Exam: BP 110/82   Pulse 73   Ht 4\' 11"  (1.499 m)   Wt 146 lb 12.8 oz (66.6 kg)   BMI 29.65 kg/m   GENERAL:  Well developed black female in NAD PSYCH:  :Pleasant, cooperative, normal affect EENT:  conjunctiva pink, mucous membranes moist, neck supple without masses CARDIAC:  RRR, no murmur heard, no peripheral edema PULM: Normal respiratory effort, lungs CTA bilaterally, no wheezing ABDOMEN:  Nondistended, soft, nontender. No obvious masses, no hepatomegaly,  normal bowel sounds SKIN:  turgor, no lesions seen Musculoskeletal:  Normal muscle tone, normal strength NEURO: Alert and oriented x 3, no focal neurologic deficits   ASSESSMENT and PLAN:  Pleasant 31 year old female with ETOH abuse /chronic abdominal pain, nausea / vomiting. Numerous ED visit and also seen by [place] three times this year. Extensive workup including EGD / colonoscopy, labs, urinalysis, and advanced abdominal imaging have been unrevealing. Currently having diarrhea, a new problem.  -will check stool for c-diff -She wants another MRI for evaluation of liver lesions. She had an MRI less than a year ago, no need for a repeat right now. Findings included multiple small benign hepatic lesions, including focal nodular hyperplasia, hemangioma, and cysts. No evidence of malignancy.  -I don't have an explanation for her chronic nausea, vomiting and abdominal pain. Labs, imaging and stable weight are all reassuring.  -Trial of Bentyl   Natalie Thornton , NP 04/19/2017, 3:32 PM

## 2017-04-19 NOTE — ED Triage Notes (Signed)
Pt presents to ED for lower mid abdominal pain "for some time now".  Pt seen on Friday for the same.  No major results from visit, according to patient.  Patient states "I just don't understand why it's always hurting".  Pt states pain is all day, most days.

## 2017-04-19 NOTE — ED Notes (Addendum)
Pt called twice. No response. 

## 2017-04-19 NOTE — Patient Instructions (Addendum)
If you are age 30 or older, your body mass index should be between 23-30. Your Body mass index is 29.65 kg/m. If this is out of the aforementioned range listed, please consider follow up with your Primary Care Provider.  If you are age 16 or younger, your body mass index should be between 19-25. Your Body mass index is 29.65 kg/m. If this is out of the aformentioned range listed, please consider follow up with your Primary Care Provider.   We have sent the following medications to your pharmacy for you to pick up at your convenience: Dicyclomine 10 mg  Your physician has requested that you go to the basement for the following lab work before leaving today: C Diff   Follow up with Dr. Henrene Pastor on June 19, 2017 at 1:45 pm.  Thank you for choosing me and [facility].   Tye Savoy, NP

## 2017-04-19 NOTE — ED Notes (Signed)
Pt states she was given a prescription for Tramadol for her pain, and "I take it every time I hurt", but states medication is not helping.

## 2017-04-20 ENCOUNTER — Encounter: Payer: Self-pay | Admitting: Internal Medicine

## 2017-04-20 ENCOUNTER — Emergency Department (HOSPITAL_COMMUNITY)
Admission: EM | Admit: 2017-04-20 | Discharge: 2017-04-20 | Disposition: A | Discharge status: Home / Self Care | Payer: Medicaid Other | Attending: Emergency Medicine | Admitting: Emergency Medicine

## 2017-04-20 ENCOUNTER — Encounter (HOSPITAL_COMMUNITY): Payer: Self-pay | Admitting: *Deleted

## 2017-04-20 ENCOUNTER — Other Ambulatory Visit: Payer: Self-pay

## 2017-04-20 DIAGNOSIS — R109 Unspecified abdominal pain: Secondary | ICD-10-CM

## 2017-04-20 DIAGNOSIS — F17290 Nicotine dependence, other tobacco product, uncomplicated: Secondary | ICD-10-CM | POA: Insufficient documentation

## 2017-04-20 MED ORDER — OMEPRAZOLE 20 MG PO CPDR: 20.0000 mg | DELAYED_RELEASE_CAPSULE | Freq: Every day | ORAL | 0 refills | Status: DC

## 2017-04-20 MED ORDER — LACTINEX PO CHEW: 1.0000 | CHEWABLE_TABLET | Freq: Three times a day (TID) | ORAL | 0 refills | Status: AC

## 2017-04-20 MED ORDER — SUCRALFATE 1 G PO TABS: 1.0000 g | ORAL_TABLET | Freq: Three times a day (TID) | ORAL | 0 refills | Status: DC

## 2017-04-20 NOTE — ED Triage Notes (Signed)
Pt in c/o generalized abdominal pain with n/v for the last several months- seen for same in the past and has not really followed up, unsure of cause of symptoms, no changes today, has MD appointment in January for same

## 2017-04-20 NOTE — ED Triage Notes (Signed)
No response in lobby for reassessment

## 2017-04-20 NOTE — ED Notes (Signed)
Pt verbalized understanding of d/c instructions and prescriptions. Pt also verbalized understanding of follow up. VSS. Pt ambulatory to lobby with steady gait and all belongings

## 2017-04-20 NOTE — ED Provider Notes (Signed)
[facility]PART[facility] Provider Note   CSN: [id number] Arrival date & time: 04/20/17  1647     History   Chief Complaint Chief Complaint  Patient presents with  . Abdominal Pain    HPI Natalie Thornton is a 31 y.o. female.  31 year old female with past medical history including anxiety, H. Pylori, generative disc disease who presents with abdominal pain.  The patient has had several months of intermittent generalized abdominal pain associated with nausea and vomiting.  She reports alternating diarrhea and constipation.  She has been seen by gastroenterology in the past, endoscopy 1 month ago showed H. pylori without peptic ulcers and she was started on the appropriate medications.  She reports that her symptoms have continued.  She was evaluated in the ED on 11/9, CT was negative for acute process.  She saw gastroenterology in the clinic yesterday and states that she was not pleased with her evaluation so she left.  She checked into the ED yesterday but left before being seen.  She reports that her symptoms are the same as usual.  She denies any urinary symptoms.  She does note heavy alcohol use.  No NSAID use.  She currently takes gabapentin and tramadol, recently finished omeprazole course, without relief of symptoms.   The history is provided by the patient.  Abdominal Pain      Past Medical History:  Diagnosis Date  . Anxiety   . Arthritis   . DDD (degenerative disc disease)   . Degenerative disc disease, cervical   . Disc disease, degenerative, cervical   . H pylori ulcer   . Pilonidal cyst     Patient Active Problem List   Diagnosis Date Noted  . Diabetes mellitus screening 01/06/2017  . Tobacco abuse 01/06/2017  . Anxiety and depression 01/06/2017  . Dyspnea on exertion 10/20/2016  . Cigarette smoker 10/20/2016  . Solitary pulmonary nodule 10/20/2016  . Acute contact dermatitis 10/20/2016  . Pilonidal sinus 07/05/2011    Past  Surgical History:  Procedure Laterality Date  . COLONOSCOPY      OB History    No data available       Home Medications    Prior to Admission medications   Medication Sig Start Date End Date Taking? Authorizing Provider  gabapentin (NEURONTIN) 100 MG capsule Take 2 capsules (200 mg total) by mouth at bedtime. Patient taking differently: Take 200 mg daily as needed by mouth (pain).  03/06/17  Yes Ladell Pier, MD  traMADol (ULTRAM) 50 MG tablet Take 1 tablet (50 mg total) every 6 (six) hours as needed by mouth. 04/15/17  Yes Delo, Nathaneil Canary, MD  dicyclomine (BENTYL) 10 MG capsule Take 1 capsule (10 mg total) 3 (three) times daily before meals by mouth. Patient not taking: Reported on 04/20/2017 04/19/17   Willia Craze, NP  lactobacillus acidophilus & bulgar (LACTINEX) chewable tablet Chew 1 tablet 3 (three) times daily with meals for 7 days by mouth. 04/20/17 04/27/17  Gearld Kerstein, Wenda Overland, MD  omeprazole (PRILOSEC) 20 MG capsule Take 1 capsule (20 mg total) daily by mouth. 04/20/17   Narelle Schoening, Wenda Overland, MD  polyethylene glycol powder (GLYCOLAX/MIRALAX) powder Take by mouth, 17 grams in 8 oz of water daily . Patient not taking: Reported on 04/20/2017 03/14/17   Esterwood, Amy S, PA-C  sucralfate (CARAFATE) 1 g tablet Take 1 tablet (1 g total) 4 (four) times daily -  with meals and at bedtime for 7 days by mouth. 04/20/17 04/27/17  Joannah Gitlin, Wenda Overland, MD    Family History Family History  Problem Relation Age of Onset  . AAA (abdominal aortic aneurysm) Maternal Grandmother   . Leukemia Maternal Grandmother   . Breast cancer Maternal Grandmother   . Head & neck cancer Paternal Grandmother        Back cancer  . Lymphoma Paternal Grandmother   . Cirrhosis Maternal Uncle   . Breast cancer Other   . Colon cancer Neg Hx   . Esophageal cancer Neg Hx   . Rectal cancer Neg Hx   . Stomach cancer Neg Hx     Social History Social History   Tobacco Use  . Smoking status:  Current Every Day Smoker    Packs/day: 5.00    Years: 10.00    Pack years: 50.00    Types: Cigars  . Smokeless tobacco: Never Used  . Tobacco comment: tobacco info given 03/14/17  Substance Use Topics  . Alcohol use: Yes    Comment: occasionally  . Drug use: No     Allergies   Patient has no known allergies.   Review of Systems Review of Systems  Gastrointestinal: Positive for abdominal pain.   All other systems reviewed and are negative except that which was mentioned in HPI   Physical Exam Updated Vital Signs BP 122/84   Pulse 71   Temp 98.9 F (37.2 C) (Oral)   Resp 14   SpO2 100%   Physical Exam  Constitutional: She is oriented to person, place, and time. She appears well-developed and well-nourished. No distress.  HENT:  Head: Normocephalic and atraumatic.  Mouth/Throat: Oropharynx is clear and moist.  Moist mucous membranes  Eyes: Conjunctivae are normal. Pupils are equal, round, and reactive to light.  Neck: Neck supple.  Cardiovascular: Normal rate, regular rhythm and normal heart sounds.  No murmur heard. Pulmonary/Chest: Effort normal and breath sounds normal.  Abdominal: Soft. Bowel sounds are normal. She exhibits no distension. There is no tenderness.  Musculoskeletal: She exhibits no edema.  Neurological: She is alert and oriented to person, place, and time.  Fluent speech  Skin: Skin is warm and dry.  Psychiatric: Judgment normal.  Depressed mood, flat affect, occasionally tearful  Nursing note and vitals reviewed.    ED Treatments / Results  Labs (all labs ordered are listed, but only abnormal results are displayed) Labs Reviewed - No data to display  EKG  EKG Interpretation None       Radiology No results found.  Procedures Procedures (including critical care time)  Medications Ordered in ED Medications - No data to display   Initial Impression / Assessment and Plan / ED Course  I have reviewed the triage vital signs and  the nursing notes.      PT w/ ongoing abd pain associated w/ N/V for months, has had CT and endoscopy previously for the same symptoms.  At [facility] gastroenterology yesterday.  It appears that they have prescribed her Bentyl but she has not started this medication yet.  Her vital signs are normal here and she has no focal abdominal tenderness during my examination.  I reviewed her lab work from yesterday and it was unremarkable including normal LFTs and lipase, normal CBC, no evidence of dehydration, pregnancy test negative.  Given the chronicity of her symptoms without any change recently, I do not feel she would benefit from any repeat lab work or imaging at this time.  I will restart her omeprazole and add Carafate and lactobacillus.  Have  extensively counseled the patient on the importance of alcohol cessation and educated her on low acid diet in the event that her symptoms are due to gastritis related to alcohol use.  Encouraged her to continue to follow with gastroenterology in the clinic.  Patient voiced understanding and was discharged in satisfactory condition.  Final Clinical Impressions(s) / ED Diagnoses   Final diagnoses:  Abdominal pain, unspecified abdominal location    ED Discharge Orders        Ordered    omeprazole (PRILOSEC) 20 MG capsule  Daily     04/20/17 1941    sucralfate (CARAFATE) 1 g tablet  3 times daily with meals & bedtime     04/20/17 1941    lactobacillus acidophilus & bulgar (LACTINEX) chewable tablet  3 times daily with meals     04/20/17 1941       Ashlan Dignan, Wenda Overland, MD 04/20/17 2000

## 2017-04-20 NOTE — Telephone Encounter (Signed)
Patient concern

## 2017-04-20 NOTE — ED Triage Notes (Signed)
Pt was here last night but LWBS, lab work obtained at that time

## 2017-04-20 NOTE — Telephone Encounter (Signed)
Patient request

## 2017-04-22 ENCOUNTER — Encounter: Payer: Self-pay | Admitting: Nurse Practitioner

## 2017-04-23 NOTE — Progress Notes (Signed)
Initial assessment and plans reviewed 

## 2017-05-02 ENCOUNTER — Encounter: Payer: Self-pay | Admitting: Physician Assistant

## 2017-05-02 ENCOUNTER — Other Ambulatory Visit (INDEPENDENT_AMBULATORY_CARE_PROVIDER_SITE_OTHER): Payer: Self-pay

## 2017-05-02 ENCOUNTER — Ambulatory Visit (INDEPENDENT_AMBULATORY_CARE_PROVIDER_SITE_OTHER): Payer: Medicaid Other | Admitting: Physician Assistant

## 2017-05-02 VITALS — BP 118/80 | HR 80 | Wt 147.0 lb

## 2017-05-02 DIAGNOSIS — G8929 Other chronic pain: Secondary | ICD-10-CM

## 2017-05-02 DIAGNOSIS — R109 Unspecified abdominal pain: Secondary | ICD-10-CM

## 2017-05-02 DIAGNOSIS — K589 Irritable bowel syndrome without diarrhea: Secondary | ICD-10-CM

## 2017-05-02 DIAGNOSIS — R112 Nausea with vomiting, unspecified: Secondary | ICD-10-CM

## 2017-05-02 LAB — C-REACTIVE PROTEIN: CRP: 0.1 mg/dL — AB (ref 0.5–20.0)

## 2017-05-02 MED ORDER — HYOSCYAMINE SULFATE 0.125 MG SL SUBL: SUBLINGUAL_TABLET | SUBLINGUAL | 6 refills | Status: DC

## 2017-05-02 MED ORDER — PANTOPRAZOLE SODIUM 40 MG PO TBEC: 40.0000 mg | DELAYED_RELEASE_TABLET | Freq: Every day | ORAL | 6 refills | Status: DC

## 2017-05-02 NOTE — Progress Notes (Signed)
Noted. She should be referred back to her PCP for treatment of chronic functional issues

## 2017-05-02 NOTE — Progress Notes (Signed)
Subjective:    Patient ID: Natalie Thornton, female    DOB: 20-May-1986, 31 y.o.   MRN: [id number]  HPI Natalie Thornton is a 31 year old African-American female, known to Dr. Henrene Pastor, who has been seen in our office on several occasions this past year. She has history of anxiety, degenerative disc disease and persistent complaints of abdominal pain. She was last seen in the office by Wallace Going NP on 04/19/2017, then went to the emergency room on 04/20/2017 because of worsening abdominal pain. She had labs done at that time which were unremarkable and no further imaging was done. At the time of visit with Wallace Going she was started on Bentyl but says she was unable to continue to take that because it caused increase in abdominal pain. She is not currently taking any medications. She says she has had ongoing problems with her abdomen hurting for at least the past year, does have periods of time where it bothers her more and says this week she's not hurting as much. She says she has pain in her mid abdomen that extends around into the sides. She also describes cramping with bowel movements, frequent headaches fatigue, and today has complaints of pain along her ribs bilaterally over the past couple of weeks. She's not had any known injury straining exercise etc. Bowel movements are currently normal no melena or hematochezia. Appetite has been okay weight has been stable. She has had nausea and vomiting in the past but is not complaining of that over the past couple of weeks. Exline She does have history of H. pylori and has been treated twice. She had EGD in October 2018 finding of small hiatal hernia, Schatzki's ring and urease was positive says retreated at that time. Colonoscopy in March 2018 was normal. CT of the abdomen and pelvis had been done with ER visit on 04/15/2017 unremarkable with the exception of multiple small hypodensities in the liver consistent with cysts versus small hemangiomas. These have  been evaluated in the past with MRI in December 2017 showing multiple small benign hepatic lesions consistent with focal nodular hyperplasia.  Patient does have history of excessive EtOH use, as was discussed with her recent ER visit, we did not specifically discuss alcohol use today. She denies any significant problems with anxiety or depression and does not feel that this is contributing to her abdominal pain. She does admit that her pain is affecting her life adversely and affecting her marriage. She says she always feels sick and its becoming a problem. On further questioning, she does feel better generally with some food on her stomach but says shortly after eating she'll have a gnawing empty discomfort that recurs.  Review of Systems Pertinent positive and negative review of systems were noted in the above HPI section.  All other review of systems was otherwise negative.  Outpatient Encounter Medications as of 05/02/2017  Medication Sig  . gabapentin (NEURONTIN) 100 MG capsule Take 2 capsules (200 mg total) by mouth at bedtime. (Patient taking differently: Take 200 mg daily as needed by mouth (pain). )  . hyoscyamine (LEVSIN SL) 0.125 MG SL tablet Take one tablet by mouth three times a day  . pantoprazole (PROTONIX) 40 MG tablet Take 1 tablet (40 mg total) by mouth daily.  . polyethylene glycol powder (GLYCOLAX/MIRALAX) powder Take by mouth, 17 grams in 8 oz of water daily . (Patient not taking: Reported on 04/20/2017)  . sucralfate (CARAFATE) 1 g tablet Take 1 tablet (1 g total) 4 (four)  times daily -  with meals and at bedtime for 7 days by mouth.  . traMADol (ULTRAM) 50 MG tablet Take 1 tablet (50 mg total) every 6 (six) hours as needed by mouth.  . [DISCONTINUED] dicyclomine (BENTYL) 10 MG capsule Take 1 capsule (10 mg total) 3 (three) times daily before meals by mouth. (Patient not taking: Reported on 04/20/2017)  . [DISCONTINUED] omeprazole (PRILOSEC) 20 MG capsule Take 1 capsule (20 mg  total) daily by mouth.   No facility-administered encounter medications on file as of 05/02/2017.    No Known Allergies Patient Active Problem List   Diagnosis Date Noted  . Diabetes mellitus screening 01/06/2017  . Tobacco abuse 01/06/2017  . Anxiety and depression 01/06/2017  . Dyspnea on exertion 10/20/2016  . Cigarette smoker 10/20/2016  . Solitary pulmonary nodule 10/20/2016  . Acute contact dermatitis 10/20/2016  . Pilonidal sinus 07/05/2011   Social History   Socioeconomic History  . Marital status: Married    Spouse name: Not on file  . Number of children: 2  . Years of education: Not on file  . Highest education level: Not on file  Social Needs  . Financial resource strain: Not on file  . Food insecurity - worry: Not on file  . Food insecurity - inability: Not on file  . Transportation needs - medical: Not on file  . Transportation needs - non-medical: Not on file  Occupational History  . Occupation: [employer]   Tobacco Use  . Smoking status: Current Every Day Smoker    Packs/day: 5.00    Years: 10.00    Pack years: 50.00    Types: Cigars  . Smokeless tobacco: Never Used  . Tobacco comment: tobacco info given 03/14/17  Substance and Sexual Activity  . Alcohol use: Yes    Comment: occasionally  . Drug use: No  . Sexual activity: Not on file  Other Topics Concern  . Not on file  Social History Narrative  . Not on file    Ms. Martone's family history includes AAA (abdominal aortic aneurysm) in her maternal grandmother; Breast cancer in her maternal grandmother and other; Cirrhosis in her maternal uncle; Head & neck cancer in her paternal grandmother; Leukemia in her maternal grandmother; Lymphoma in her paternal grandmother.      Objective:    Vitals:   05/02/17 1501  BP: 118/80  Pulse: 80    Physical Exam; well-developed young African-American female in no acute distress, blood pressure 118/80, pulse 80, weight 147, BMI 29.6. HEENT nontraumatic  normocephalic EOMI PERRLA sclera anicteric, Cardiovascular regular rate and rhythm with S1-S2 no murmur rub or gallop, Pulmonary clear bilaterally, Abdomen soft, no focal abdominal tenderness, no guarding or rebound no palpable mass or hepatosplenomegaly bowel sounds are present, Rectal exam not done, Extremities no clubbing cyanosis or edema skin warm and dry, Neuropsych mood and affect appropriate , though affect is flat       Assessment & Plan:   #43 31 year old African-American female with chronic complaints of abdominal pain and periodic nausea and vomiting. Patient has had a fairly extensive GI workup which is been unrevealing as to cause for her pain which is felt to be functional in nature consistent with IBS. Fairly recent upper endoscopy, colonoscopy CT scan prior ultrasounds and MRI.  #2; hepatic focal nodular hyperplasia #3 history of anxiety/depression patient denies currently #4 history of excessive EtOH  Plan; Start Protonix 40 mg by mouth every morning before meals breakfast Patient did not tolerate Bentyl, we will  try Levsin tablets 1 by mouth 3 times a day. Patient is asked to try both of these medications for 3-4 weeks and if no significant improvement in her symptoms she is asked to call back and at that time would consider giving her a trial of either amitriptyline or Remeron. Patient is advised that if meds are helpful that she may take these for maintenance. Sedimentation rate, CRP and ANA today   Amy S Esterwood PA-C 05/02/2017   Cc: Ladell Pier, MD

## 2017-05-02 NOTE — Patient Instructions (Signed)
Your physician has requested that you go to the basement for lab work before leaving today.  Stop taking omeprazole and omeprazole.   We have sent the following medications to your pharmacy for you to pick up at your convenience: Protonix and Levsin.   Decrease your alcohol use and Avoid all NSAID's such as aspirin, diclofenac, ibuprofen.  Please call back in 3-4 weeks if not better, ask for Amy's nurse.  Normal BMI (Body Mass Index- based on height and weight) is between 19 and 25. Your BMI today is Body mass index is 29.69 kg/m. Marland Kitchen Please consider follow up  regarding your BMI with your Primary Care Provider.

## 2017-05-03 LAB — ANA: ANA: NEGATIVE

## 2017-05-16 ENCOUNTER — Encounter: Payer: Self-pay | Admitting: Physician Assistant

## 2017-05-17 ENCOUNTER — Other Ambulatory Visit: Payer: Self-pay

## 2017-05-17 DIAGNOSIS — R112 Nausea with vomiting, unspecified: Secondary | ICD-10-CM

## 2017-05-17 DIAGNOSIS — R1011 Right upper quadrant pain: Secondary | ICD-10-CM

## 2017-05-17 NOTE — Telephone Encounter (Signed)
Patient notified of the appointment for CCK Hida on 05/19/17 at 9:30 am at [facility] radiology. Arrive at 9:15 am NPO after midnight.

## 2017-05-19 ENCOUNTER — Ambulatory Visit (HOSPITAL_COMMUNITY)
Admission: RE | Admit: 2017-05-19 | Discharge: 2017-05-19 | Disposition: A | Discharge status: Home / Self Care | Payer: Medicaid Other | Source: Ambulatory Visit | Attending: Physician Assistant | Admitting: Physician Assistant

## 2017-05-19 DIAGNOSIS — R112 Nausea with vomiting, unspecified: Secondary | ICD-10-CM | POA: Insufficient documentation

## 2017-05-19 DIAGNOSIS — R1011 Right upper quadrant pain: Secondary | ICD-10-CM | POA: Insufficient documentation

## 2017-05-19 MED ORDER — TECHNETIUM TC 99M MEBROFENIN IV KIT
5.1600 | PACK | Freq: Once | INTRAVENOUS | Status: AC | PRN
  Administered 2017-05-19: 5.16 via INTRAVENOUS

## 2017-05-22 ENCOUNTER — Encounter: Payer: Self-pay | Admitting: Physician Assistant

## 2017-05-22 ENCOUNTER — Telehealth: Payer: Self-pay | Admitting: Physician Assistant

## 2017-05-23 ENCOUNTER — Encounter: Payer: Self-pay | Admitting: Diagnostic Radiology

## 2017-05-23 ENCOUNTER — Encounter: Payer: Self-pay | Admitting: Physician Assistant

## 2017-05-23 ENCOUNTER — Encounter (HOSPITAL_COMMUNITY): Payer: Self-pay | Admitting: Emergency Medicine

## 2017-05-23 ENCOUNTER — Telehealth: Payer: Self-pay | Admitting: Physician Assistant

## 2017-05-23 DIAGNOSIS — Z5321 Procedure and treatment not carried out due to patient leaving prior to being seen by health care provider: Secondary | ICD-10-CM | POA: Insufficient documentation

## 2017-05-23 DIAGNOSIS — R109 Unspecified abdominal pain: Secondary | ICD-10-CM | POA: Insufficient documentation

## 2017-05-23 LAB — COMPREHENSIVE METABOLIC PANEL
ALT: 10 U/L — ABNORMAL LOW (ref 14–54)
AST: 18 U/L (ref 15–41)
Albumin: 3.6 g/dL (ref 3.5–5.0)
Alkaline Phosphatase: 35 U/L — ABNORMAL LOW (ref 38–126)
Anion gap: 8 (ref 5–15)
BUN: 6 mg/dL (ref 6–20)
CHLORIDE: 107 mmol/L (ref 101–111)
CO2: 24 mmol/L (ref 22–32)
Calcium: 9.2 mg/dL (ref 8.9–10.3)
Creatinine, Ser: 0.85 mg/dL (ref 0.44–1.00)
Glucose, Bld: 113 mg/dL — ABNORMAL HIGH (ref 65–99)
Potassium: 3.1 mmol/L — ABNORMAL LOW (ref 3.5–5.1)
Sodium: 139 mmol/L (ref 135–145)
Total Bilirubin: 0.4 mg/dL (ref 0.3–1.2)
Total Protein: 6.3 g/dL — ABNORMAL LOW (ref 6.5–8.1)

## 2017-05-23 LAB — URINALYSIS, ROUTINE W REFLEX MICROSCOPIC
BACTERIA UA: NONE SEEN
BILIRUBIN URINE: NEGATIVE
Glucose, UA: NEGATIVE mg/dL
KETONES UR: NEGATIVE mg/dL
LEUKOCYTES UA: NEGATIVE
NITRITE: NEGATIVE
PH: 5 (ref 5.0–8.0)
Protein, ur: NEGATIVE mg/dL
Specific Gravity, Urine: 1.026 (ref 1.005–1.030)

## 2017-05-23 LAB — I-STAT BETA HCG BLOOD, ED (MC, WL, AP ONLY): I-stat hCG, quantitative: <5 m[IU]/mL (ref <5)

## 2017-05-23 LAB — CBC
HEMATOCRIT: 38.4 % (ref 36.0–46.0)
Hemoglobin: 13 g/dL (ref 12.0–15.0)
MCH: 30.7 pg (ref 26.0–34.0)
MCHC: 33.9 g/dL (ref 30.0–36.0)
MCV: 90.8 fL (ref 78.0–100.0)
PLATELETS: 247 10*3/uL (ref 150–400)
RBC: 4.23 MIL/uL (ref 3.87–5.11)
RDW: 12.4 % (ref 11.5–15.5)
WBC: 5.3 10*3/uL (ref 4.0–10.5)

## 2017-05-23 LAB — LIPASE, BLOOD: LIPASE: 32 U/L (ref 11–51)

## 2017-05-23 NOTE — Telephone Encounter (Signed)
I am waiting for the provider to tell me about the results. If she were to call again please let her know. Sorry for the delay.

## 2017-05-23 NOTE — ED Triage Notes (Signed)
Pt reports LLQ pain X few days, believes she has an ovarian cyst. Denies V/D, vag bleeding. States pain is sharp, constant, 10/10. Pt in NAD.

## 2017-05-23 NOTE — Telephone Encounter (Signed)
Natalie Thornton results are in Natalie Thornton. She has a low ejection fraction of her gall bladder.

## 2017-05-24 ENCOUNTER — Other Ambulatory Visit: Payer: Self-pay

## 2017-05-24 ENCOUNTER — Emergency Department (HOSPITAL_COMMUNITY)
Admission: EM | Admit: 2017-05-24 | Discharge: 2017-05-24 | Discharge status: Against Medical Advice | Payer: Medicaid Other | Source: Home / Self Care

## 2017-05-24 ENCOUNTER — Emergency Department (HOSPITAL_COMMUNITY)
Admission: EM | Admit: 2017-05-24 | Discharge: 2017-05-24 | Disposition: A | Discharge status: Home / Self Care | Payer: Medicaid Other | Attending: Emergency Medicine | Admitting: Emergency Medicine

## 2017-05-24 NOTE — ED Notes (Signed)
No answer at 19:41 when called for vitals.

## 2017-05-24 NOTE — ED Notes (Signed)
Please move OTF.

## 2017-05-24 NOTE — ED Triage Notes (Signed)
Pt was at work and got nauseated with lightheadedness. She has been told she had a cyst.  Pt also complaining of right chest cyst.  LMP one week ago

## 2017-05-24 NOTE — ED Notes (Signed)
Pt called for treatment room, no response 

## 2017-05-24 NOTE — ED Notes (Signed)
No response in waiting area for reassessment

## 2017-05-25 ENCOUNTER — Other Ambulatory Visit: Payer: Self-pay

## 2017-05-25 ENCOUNTER — Encounter (HOSPITAL_COMMUNITY): Payer: Self-pay

## 2017-05-25 ENCOUNTER — Emergency Department (HOSPITAL_COMMUNITY)
Admission: EM | Admit: 2017-05-25 | Discharge: 2017-05-25 | Disposition: A | Discharge status: Home / Self Care | Payer: Medicaid Other | Attending: Emergency Medicine | Admitting: Emergency Medicine

## 2017-05-25 DIAGNOSIS — R1010 Upper abdominal pain, unspecified: Secondary | ICD-10-CM

## 2017-05-25 DIAGNOSIS — R102 Pelvic and perineal pain: Secondary | ICD-10-CM | POA: Insufficient documentation

## 2017-05-25 DIAGNOSIS — Z5321 Procedure and treatment not carried out due to patient leaving prior to being seen by health care provider: Secondary | ICD-10-CM | POA: Insufficient documentation

## 2017-05-25 LAB — URINALYSIS, ROUTINE W REFLEX MICROSCOPIC
Glucose, UA: NEGATIVE mg/dL
Hgb urine dipstick: NEGATIVE
KETONES UR: 5 mg/dL — AB
LEUKOCYTES UA: NEGATIVE
NITRITE: NEGATIVE
PROTEIN: NEGATIVE mg/dL
Specific Gravity, Urine: 1.024 (ref 1.005–1.030)
pH: 6 (ref 5.0–8.0)

## 2017-05-25 LAB — CBC
HEMATOCRIT: 39.1 % (ref 36.0–46.0)
Hemoglobin: 13.1 g/dL (ref 12.0–15.0)
MCH: 30.7 pg (ref 26.0–34.0)
MCHC: 33.5 g/dL (ref 30.0–36.0)
MCV: 91.6 fL (ref 78.0–100.0)
PLATELETS: 251 10*3/uL (ref 150–400)
RBC: 4.27 MIL/uL (ref 3.87–5.11)
RDW: 12.8 % (ref 11.5–15.5)
WBC: 5.2 10*3/uL (ref 4.0–10.5)

## 2017-05-25 LAB — COMPREHENSIVE METABOLIC PANEL
ALBUMIN: 3.9 g/dL (ref 3.5–5.0)
ALT: 10 U/L — AB (ref 14–54)
AST: 17 U/L (ref 15–41)
Alkaline Phosphatase: 39 U/L (ref 38–126)
Anion gap: 6 (ref 5–15)
BUN: 6 mg/dL (ref 6–20)
CHLORIDE: 106 mmol/L (ref 101–111)
CO2: 26 mmol/L (ref 22–32)
CREATININE: 0.87 mg/dL (ref 0.44–1.00)
Calcium: 9.2 mg/dL (ref 8.9–10.3)
GFR calc Af Amer: >60 mL/min (ref >60)
GFR calc non Af Amer: >60 mL/min (ref >60)
GLUCOSE: 95 mg/dL (ref 65–99)
POTASSIUM: 3.7 mmol/L (ref 3.5–5.1)
SODIUM: 138 mmol/L (ref 135–145)
Total Bilirubin: 0.5 mg/dL (ref 0.3–1.2)
Total Protein: 6.5 g/dL (ref 6.5–8.1)

## 2017-05-25 LAB — I-STAT BETA HCG BLOOD, ED (MC, WL, AP ONLY): I-stat hCG, quantitative: <5 m[IU]/mL (ref <5)

## 2017-05-25 NOTE — ED Notes (Signed)
Pt given gown and blanket and requested to change into same.

## 2017-05-25 NOTE — ED Triage Notes (Signed)
Per Pt, pT is coming from home with complaints of left lower pelvic pain that started a couple weeks ago. Denies any vaginal bleeding or discharge. Known ovarian cyst. Reports nausea and some urinary frequency, but denies burning with urination.

## 2017-05-25 NOTE — ED Notes (Signed)
Pt walks out and states to [place] RN she has family emergency and leaves ED.

## 2017-06-02 ENCOUNTER — Ambulatory Visit (HOSPITAL_COMMUNITY): Admission: RE | Admit: 2017-06-02 | Payer: Medicaid Other | Source: Ambulatory Visit

## 2017-06-09 ENCOUNTER — Encounter: Payer: Self-pay | Admitting: Internal Medicine

## 2017-06-13 ENCOUNTER — Ambulatory Visit (HOSPITAL_COMMUNITY): Payer: Medicaid Other

## 2017-06-15 ENCOUNTER — Encounter (HOSPITAL_COMMUNITY): Payer: Self-pay | Admitting: *Deleted

## 2017-06-15 ENCOUNTER — Emergency Department (HOSPITAL_COMMUNITY)
Admission: EM | Admit: 2017-06-15 | Discharge: 2017-06-15 | Disposition: A | Discharge status: Home / Self Care | Payer: Medicaid Other | Attending: Emergency Medicine | Admitting: Emergency Medicine

## 2017-06-15 DIAGNOSIS — R109 Unspecified abdominal pain: Secondary | ICD-10-CM | POA: Insufficient documentation

## 2017-06-15 DIAGNOSIS — Z5321 Procedure and treatment not carried out due to patient leaving prior to being seen by health care provider: Secondary | ICD-10-CM | POA: Insufficient documentation

## 2017-06-15 LAB — I-STAT BETA HCG BLOOD, ED (MC, WL, AP ONLY): I-stat hCG, quantitative: <5 m[IU]/mL (ref <5)

## 2017-06-15 LAB — COMPREHENSIVE METABOLIC PANEL
ALK PHOS: 31 U/L — AB (ref 38–126)
ALT: 10 U/L — AB (ref 14–54)
AST: 18 U/L (ref 15–41)
Albumin: 3.6 g/dL (ref 3.5–5.0)
Anion gap: 6 (ref 5–15)
BUN: 5 mg/dL — AB (ref 6–20)
CHLORIDE: 106 mmol/L (ref 101–111)
CO2: 25 mmol/L (ref 22–32)
CREATININE: 0.84 mg/dL (ref 0.44–1.00)
Calcium: 9 mg/dL (ref 8.9–10.3)
GFR calc Af Amer: >60 mL/min (ref >60)
Glucose, Bld: 112 mg/dL — ABNORMAL HIGH (ref 65–99)
Potassium: 4 mmol/L (ref 3.5–5.1)
Sodium: 137 mmol/L (ref 135–145)
Total Bilirubin: 0.6 mg/dL (ref 0.3–1.2)
Total Protein: 5.9 g/dL — ABNORMAL LOW (ref 6.5–8.1)

## 2017-06-15 LAB — URINALYSIS, ROUTINE W REFLEX MICROSCOPIC
Bilirubin Urine: NEGATIVE
Glucose, UA: NEGATIVE mg/dL
Hgb urine dipstick: NEGATIVE
Ketones, ur: NEGATIVE mg/dL
LEUKOCYTES UA: NEGATIVE
Nitrite: NEGATIVE
PH: 7 (ref 5.0–8.0)
Protein, ur: 30 mg/dL — AB
SPECIFIC GRAVITY, URINE: 1.021 (ref 1.005–1.030)

## 2017-06-15 LAB — CBC
HCT: 40.9 % (ref 36.0–46.0)
Hemoglobin: 13.2 g/dL (ref 12.0–15.0)
MCH: 30.2 pg (ref 26.0–34.0)
MCHC: 32.3 g/dL (ref 30.0–36.0)
MCV: 93.6 fL (ref 78.0–100.0)
PLATELETS: 229 10*3/uL (ref 150–400)
RBC: 4.37 MIL/uL (ref 3.87–5.11)
RDW: 12.4 % (ref 11.5–15.5)
WBC: 5.2 10*3/uL (ref 4.0–10.5)

## 2017-06-15 LAB — LIPASE, BLOOD: LIPASE: 27 U/L (ref 11–51)

## 2017-06-15 NOTE — ED Triage Notes (Signed)
To ED for eval of lower pelvic pain for past few weeks. States she has been in ED for same but unable to stay due to wait time. States she has ovarian cysts on right side but this pain is on left side. Pain is constant - standing makes pain a little better. No pain with bowel movements or urination. Denies vaginal discharge.

## 2017-06-15 NOTE — ED Provider Notes (Cosign Needed)
[facility] EMERGENCY DEPARTMENT Provider Note   CSN: [id number] Arrival date & time: 06/15/17  4259     History   Chief Complaint Chief Complaint  Patient presents with  . Abdominal Pain    HPI Natalie Thornton is a 32 y.o. female.  HPI    32 year old female presents today with complaints of abdominal pain.  Patient reports left-sided suprapubic abdominal pain for the last 4 weeks.  She notes the pain radiates down the front of her left leg.  She denies any injury.  She notes symptoms are improved with standing.  She denies any fever, nausea or vomiting, denies any upper abdominal pain.  She describes it as pressure pain.  Patient does have history of right ovarian cyst and notes this feels different.  Patient reports urinary frequency 2 days ago.  Patient reports she is sexually active with one female partner.  Normal bowel movements.     Past Medical History:  Diagnosis Date  . Anxiety   . Arthritis   . DDD (degenerative disc disease)   . Degenerative disc disease, cervical   . Disc disease, degenerative, cervical   . H pylori ulcer   . Pilonidal cyst     Patient Active Problem List   Diagnosis Date Noted  . Diabetes mellitus screening 01/06/2017  . Tobacco abuse 01/06/2017  . Anxiety and depression 01/06/2017  . Dyspnea on exertion 10/20/2016  . Cigarette smoker 10/20/2016  . Solitary pulmonary nodule 10/20/2016  . Acute contact dermatitis 10/20/2016  . Pilonidal sinus 07/05/2011    Past Surgical History:  Procedure Laterality Date  . COLONOSCOPY      OB History    No data available       Home Medications    Prior to Admission medications   Medication Sig Start Date End Date Taking? Authorizing Provider  gabapentin (NEURONTIN) 100 MG capsule Take 2 capsules (200 mg total) by mouth at bedtime. Patient not taking: Reported on 05/24/2017 03/06/17   Ladell Pier, MD  hyoscyamine (LEVSIN SL) 0.125 MG SL tablet Take one tablet by  mouth three times a day Patient not taking: Reported on 05/24/2017 05/02/17   Esterwood, Amy S, PA-C  pantoprazole (PROTONIX) 40 MG tablet Take 1 tablet (40 mg total) by mouth daily. Patient not taking: Reported on 05/24/2017 05/02/17   Esterwood, Amy S, PA-C  polyethylene glycol powder (GLYCOLAX/MIRALAX) powder Take by mouth, 17 grams in 8 oz of water daily . Patient not taking: Reported on 05/24/2017 03/14/17   Esterwood, Amy S, PA-C  sucralfate (CARAFATE) 1 g tablet Take 1 tablet (1 g total) 4 (four) times daily -  with meals and at bedtime for 7 days by mouth. Patient not taking: Reported on 05/24/2017 04/20/17 04/27/17  Little, Wenda Overland, MD  traMADol (ULTRAM) 50 MG tablet Take 1 tablet (50 mg total) every 6 (six) hours as needed by mouth. Patient not taking: Reported on 05/25/2017 04/15/17   Veryl Speak, MD    Family History Family History  Problem Relation Age of Onset  . AAA (abdominal aortic aneurysm) Maternal Grandmother   . Leukemia Maternal Grandmother   . Breast cancer Maternal Grandmother   . Head & neck cancer Paternal Grandmother        Back cancer  . Lymphoma Paternal Grandmother   . Cirrhosis Maternal Uncle   . Breast cancer Other   . Colon cancer Neg Hx   . Esophageal cancer Neg Hx   . Rectal cancer Neg Hx   .  Stomach cancer Neg Hx     Social History Social History   Tobacco Use  . Smoking status: Current Every Day Smoker    Packs/day: 5.00    Years: 10.00    Pack years: 50.00    Types: Cigars  . Smokeless tobacco: Never Used  . Tobacco comment: tobacco info given 03/14/17  Substance Use Topics  . Alcohol use: Yes    Comment: occasionally  . Drug use: No     Allergies   Patient has no known allergies.   Review of Systems Review of Systems  All other systems reviewed and are negative.   Physical Exam Updated Vital Signs BP (!) 81/12   Pulse 86   SpO2 99%   Physical Exam  Constitutional: She is oriented to person, place, and time.  She appears well-developed and well-nourished.  HENT:  Head: Normocephalic and atraumatic.  Eyes: Conjunctivae are normal. Pupils are equal, round, and reactive to light. Right eye exhibits no discharge. Left eye exhibits no discharge. No scleral icterus.  Neck: Normal range of motion. No JVD present. No tracheal deviation present.  Cardiovascular: Normal rate and regular rhythm.  Pulmonary/Chest: Effort normal and breath sounds normal. No stridor. No respiratory distress. She has no wheezes. She has no rales.  Abdominal: Soft. She exhibits no distension and no mass. There is tenderness. There is no rebound and no guarding. No hernia.  Minor suprapubic tenderness, no masses, no hernia, no redness or warmth  Neurological: She is alert and oriented to person, place, and time. Coordination normal.  Psychiatric: She has a normal mood and affect. Her behavior is normal. Judgment and thought content normal.  Nursing note and vitals reviewed.    ED Treatments / Results  Labs (all labs ordered are listed, but only abnormal results are displayed) Labs Reviewed  COMPREHENSIVE METABOLIC PANEL - Abnormal; Notable for the following components:      Result Value   Glucose, Bld 112 (*)    BUN 5 (*)    Total Protein 5.9 (*)    ALT 10 (*)    Alkaline Phosphatase 31 (*)    All other components within normal limits  URINALYSIS, ROUTINE W REFLEX MICROSCOPIC - Abnormal; Notable for the following components:   Protein, ur 30 (*)    Bacteria, UA RARE (*)    Squamous Epithelial / LPF 0-5 (*)    All other components within normal limits  LIPASE, BLOOD  CBC  I-STAT BETA HCG BLOOD, ED ([facility], [facility], [facility] ONLY)  GC/CHLAMYDIA PROBE AMP ([place]) NOT AT [facility]    EKG  EKG Interpretation None       Radiology No results found.  Procedures Procedures (including critical care time)  Medications Ordered in ED Medications - No data to display   Initial Impression / Assessment and Plan / ED Course  I  have reviewed the triage vital signs and the nursing notes.  Pertinent labs & imaging results that were available during my care of the patient were reviewed by me and considered in my medical decision making (see chart for details).      Final Clinical Impressions(s) / ED Diagnoses   Final diagnoses:  Abdominal pain, unspecified abdominal location    32 year old female presents today with lower abdominal and pelvic pain.  After initial evaluation I recommended proceeding with pelvic exam and ongoing management.  Prior to returning to the room patient left without telling me.  I do have lower suspicion for any acute pathology in this  patient but would have like to continue her exam.  ED Discharge Orders    None       Okey Regal, Hershal Coria 06/15/17 2122

## 2017-06-15 NOTE — ED Notes (Addendum)
Pt reports family emergency, states has to leave ASAP.  Pt reports unable to wait to consult with Merry Proud, [place].

## 2017-06-18 ENCOUNTER — Encounter: Payer: Self-pay | Admitting: Internal Medicine

## 2017-06-19 ENCOUNTER — Encounter: Payer: Self-pay | Admitting: Internal Medicine

## 2017-06-19 ENCOUNTER — Ambulatory Visit (INDEPENDENT_AMBULATORY_CARE_PROVIDER_SITE_OTHER): Payer: Self-pay | Admitting: Internal Medicine

## 2017-06-19 VITALS — BP 102/70 | HR 61 | Ht 59.0 in | Wt 147.0 lb

## 2017-06-19 DIAGNOSIS — M549 Dorsalgia, unspecified: Secondary | ICD-10-CM

## 2017-06-19 DIAGNOSIS — R102 Pelvic and perineal pain: Secondary | ICD-10-CM

## 2017-06-19 DIAGNOSIS — R109 Unspecified abdominal pain: Secondary | ICD-10-CM

## 2017-06-19 DIAGNOSIS — R112 Nausea with vomiting, unspecified: Secondary | ICD-10-CM

## 2017-06-19 NOTE — Telephone Encounter (Signed)
Imaging request through [facility], [place]

## 2017-06-19 NOTE — Patient Instructions (Signed)
Make sure to keep your appointment for your Gastric Emptying Scan tomorrow

## 2017-06-20 ENCOUNTER — Encounter (HOSPITAL_COMMUNITY)
Admission: RE | Admit: 2017-06-20 | Discharge: 2017-06-20 | Disposition: A | Discharge status: Home / Self Care | Payer: Medicaid Other | Source: Ambulatory Visit | Attending: Physician Assistant | Admitting: Physician Assistant

## 2017-06-20 DIAGNOSIS — R1010 Upper abdominal pain, unspecified: Secondary | ICD-10-CM | POA: Insufficient documentation

## 2017-06-21 ENCOUNTER — Encounter: Payer: Self-pay | Admitting: Internal Medicine

## 2017-06-21 NOTE — Progress Notes (Signed)
HISTORY OF PRESENT ILLNESS:  Natalie Thornton is a 32 y.o. female, [employer] employee, with anxiety, arthritis, and degenerative disc disease who schedules this office appointment for evaluation of chronic abdominal pain. She has been evaluated on multiple occasions for the same with extensive workup including colonoscopy, upper endoscopy, CT imaging, MRI imaging, HIDA scan, and multiple laboratories. Workup has been entirely negative or normal except for the presence of Helicobacter pylori with a normal-appearing stomach which was positive. Subsequently treated. She is accompanied today by her friend. She is frustrated with her ongoing complaints of pain in once to understand the root cause. She also has back pain and wears a back brace. Complains now of new pelvic pain area has not seen her GYN since. She tells me that her bowel habits are normal. Previous problems with constipation. She denies marijuana. She does use alcohol. She does state that a muscle relaxer for her back seems to help her abdominal complaints. In terms of her pelvic pain symptoms are worse with sitting and lying and improved with standing. She denies problems with stress, anxiety, depression  REVIEW OF SYSTEMS:  All non-GI ROS negative except for back pain, pelvic pain  Past Medical History:  Diagnosis Date  . Anxiety   . Arthritis   . DDD (degenerative disc disease)   . Degenerative disc disease, cervical   . Disc disease, degenerative, cervical   . H pylori ulcer   . Pilonidal cyst     Past Surgical History:  Procedure Laterality Date  . COLONOSCOPY      Social History Natalie Thornton  reports that she has been smoking cigars.  She has a 50.00 pack-year smoking history. she has never used smokeless tobacco. She reports that she drinks alcohol. She reports that she does not use drugs.  family history includes AAA (abdominal aortic aneurysm) in her maternal grandmother; Breast cancer in her maternal grandmother  and other; Cirrhosis in her maternal uncle; Head & neck cancer in her paternal grandmother; Leukemia in her maternal grandmother; Lymphoma in her paternal grandmother.  No Known Allergies     PHYSICAL EXAMINATION: Vital signs: BP 102/70   Pulse 61   Ht 4\' 11"  (1.499 m)   Wt 147 lb (66.7 kg)   BMI 29.69 kg/m   Constitutional: generally well-appearing, no acute distress Psychiatric: alert and oriented x3, cooperative. Somewhat depressed-appearing Eyes: extraocular movements intact, anicteric, conjunctiva pink Mouth: oral pharynx moist, no lesions Neck: supple no lymphadenopathy Cardiovascular: heart regular rate and rhythm, no murmur Lungs: clear to auscultation bilaterally Abdomen: soft, nontender, nondistended, no obvious ascites, no peritoneal signs, normal bowel sounds, no organomegaly Rectal: Omitted Extremities: no lower extremity edema bilaterally Skin: . Tattoos. no additional lesions on visible extremities Neuro: No focal deficits. Cranial nerves intact  ASSESSMENT:  #1. Chronic functional abdominal pain. #2. Back pain. Question playing a role #3. New pelvic pain. Should be evaluated #4. Suspect depression, anxiety   PLAN:  #1. Discussion on chronic functional abdominal pain. Literature on the topic also provided for the patient's review #2. Advised to talk to her PCP regarding further assessment of her back and possible especially referral #3. Advised to see her GYN regarding pelvic pain #4. Discussed that her PCP may want to prescribe antidepressant therapy such as SSRI as this can be helpful with mood disorder as well as chronic functional abdominal complaints. I will forward a copy of this note to Dr. Wynetta Emery in this regard #5. No specific GI follow-up arranged. GI follow-up as  needed  25 minutes spent face-to-face with the patient. Greater than 50% a time use for counseling regarding her chronic functional abdominal pain other complaints with careful review  recommendations as outlined  A copy of this consultation note has been sent to Dr. Wynetta Emery

## 2017-06-22 ENCOUNTER — Inpatient Hospital Stay (HOSPITAL_COMMUNITY)
Admission: AD | Admit: 2017-06-22 | Discharge: 2017-06-22 | Disposition: A | Discharge status: Home / Self Care | Payer: Medicaid Other | Source: Ambulatory Visit | Attending: Obstetrics and Gynecology | Admitting: Obstetrics and Gynecology

## 2017-06-22 ENCOUNTER — Other Ambulatory Visit: Payer: Self-pay

## 2017-06-22 ENCOUNTER — Other Ambulatory Visit: Payer: Self-pay | Admitting: Certified Nurse Midwife

## 2017-06-22 DIAGNOSIS — N644 Mastodynia: Secondary | ICD-10-CM

## 2017-06-22 DIAGNOSIS — F17210 Nicotine dependence, cigarettes, uncomplicated: Secondary | ICD-10-CM | POA: Insufficient documentation

## 2017-06-22 LAB — URINALYSIS, ROUTINE W REFLEX MICROSCOPIC
BILIRUBIN URINE: NEGATIVE
Glucose, UA: NEGATIVE mg/dL
Hgb urine dipstick: NEGATIVE
Ketones, ur: NEGATIVE mg/dL
Nitrite: NEGATIVE
PH: 5 (ref 5.0–8.0)
Protein, ur: NEGATIVE mg/dL
SPECIFIC GRAVITY, URINE: 1.021 (ref 1.005–1.030)

## 2017-06-22 LAB — POCT PREGNANCY, URINE: Preg Test, Ur: NEGATIVE

## 2017-06-22 NOTE — Progress Notes (Signed)
See MAU provider note for details.

## 2017-06-22 NOTE — Discharge Instructions (Signed)

## 2017-06-22 NOTE — MAU Provider Note (Signed)
History     CSN: [id number]  Arrival date and time: 06/22/17 1404   First Provider Initiated Contact with Patient 06/22/17 1550      Chief Complaint  Patient presents with  . Breast Pain   32 y.o. Non-pregnant female here with left breast pain. Pain started 2 weeks ago. She hasn't used anything for it. Her girlfriend felt a lump. Unsure of skin color changes, hasn't looked at it. No nipple discharge.    Past Medical History:  Diagnosis Date  . Anxiety   . Arthritis   . DDD (degenerative disc disease)   . Degenerative disc disease, cervical   . Disc disease, degenerative, cervical   . H pylori ulcer   . Pilonidal cyst     Past Surgical History:  Procedure Laterality Date  . COLONOSCOPY      Family History  Problem Relation Age of Onset  . AAA (abdominal aortic aneurysm) Maternal Grandmother   . Leukemia Maternal Grandmother   . Breast cancer Maternal Grandmother   . Head & neck cancer Paternal Grandmother        Back cancer  . Lymphoma Paternal Grandmother   . Cirrhosis Maternal Uncle   . Breast cancer Other   . Colon cancer Neg Hx   . Esophageal cancer Neg Hx   . Rectal cancer Neg Hx   . Stomach cancer Neg Hx     Social History   Tobacco Use  . Smoking status: Current Every Day Smoker    Packs/day: 5.00    Years: 10.00    Pack years: 50.00    Types: Cigars  . Smokeless tobacco: Never Used  . Tobacco comment: tobacco info given 03/14/17  Substance Use Topics  . Alcohol use: Yes    Comment: occasionally  . Drug use: No    Allergies: No Known Allergies  No medications prior to admission.    Review of Systems  Constitutional: Negative.   Skin: Negative.    Physical Exam   Blood pressure 112/69, pulse 91, temperature 98.3 F (36.8 C), temperature source Oral, resp. rate 16, weight 64.9 kg (143 lb), last menstrual period 05/26/2017, SpO2 99 %.  Physical Exam  Nursing note and vitals reviewed. Constitutional: She is oriented to person, place,  and time. She appears well-developed and well-nourished. No distress.  HENT:  Head: Normocephalic.  Neck: Normal range of motion.  Respiratory: Effort normal. No respiratory distress. Right breast exhibits no inverted nipple, no mass, no nipple discharge, no skin change and no tenderness. Left breast exhibits mass (52mm round, firm, mobile mass at 10 o'clock > ?fibrocystic changes) and tenderness. Left breast exhibits no inverted nipple, no nipple discharge and no skin change. Breasts are symmetrical.  Musculoskeletal: Normal range of motion.  Neurological: She is alert and oriented to person, place, and time.  Skin: Skin is warm and dry.  Psychiatric: She has a normal mood and affect.   Results for orders placed or performed during the hospital encounter of 06/22/17 (from the past 24 hour(s))  Urinalysis, Routine w reflex microscopic     Status: Abnormal   Collection Time: 06/22/17  2:42 PM  Result Value Ref Range   Color, Urine YELLOW YELLOW   APPearance HAZY (A) CLEAR   Specific Gravity, Urine 1.021 1.005 - 1.030   pH 5.0 5.0 - 8.0   Glucose, UA NEGATIVE NEGATIVE mg/dL   Hgb urine dipstick NEGATIVE NEGATIVE   Bilirubin Urine NEGATIVE NEGATIVE   Ketones, ur NEGATIVE NEGATIVE mg/dL   Protein,  ur NEGATIVE NEGATIVE mg/dL   Nitrite NEGATIVE NEGATIVE   Leukocytes, UA LARGE (A) NEGATIVE   RBC / HPF 0-5 0 - 5 RBC/hpf   WBC, UA 6-30 0 - 5 WBC/hpf   Bacteria, UA RARE (A) NONE SEEN   Squamous Epithelial / LPF 6-30 (A) NONE SEEN   Mucus PRESENT   Pregnancy, urine POC     Status: None   Collection Time: 06/22/17  2:53 PM  Result Value Ref Range   Preg Test, Ur NEGATIVE NEGATIVE   MAU Course  Procedures  MDM Labs ordered and reviewed. Tiny left breast mass, ? fibrocystic changes. Tenderness could be physiologic, recommend Vitamin E. Will order outpt imaging. Stable for discharge home.  Assessment and Plan   1. Breast pain    Discharge home Follow up at the breast center for imaging  in 1 week Follow up at [facility] prn Vitamin E daily  Allergies as of 06/22/2017   No Known Allergies     Medication List    You have not been prescribed any medications.    Julianne Handler, CNM 06/22/2017, 3:58 PM

## 2017-06-22 NOTE — MAU Note (Signed)
Been feeling some pain in her left breast.  Started about 2 wks ago.  Feels a little hard, tender to touch.

## 2017-06-26 ENCOUNTER — Encounter: Payer: Self-pay | Admitting: Internal Medicine

## 2017-06-26 ENCOUNTER — Ambulatory Visit: Payer: Self-pay | Attending: Internal Medicine | Admitting: Internal Medicine

## 2017-06-26 VITALS — BP 118/85 | HR 72 | Temp 98.6°F | Resp 16 | Ht 59.0 in | Wt 147.0 lb

## 2017-06-26 DIAGNOSIS — R102 Pelvic and perineal pain: Secondary | ICD-10-CM | POA: Insufficient documentation

## 2017-06-26 DIAGNOSIS — R0602 Shortness of breath: Secondary | ICD-10-CM | POA: Insufficient documentation

## 2017-06-26 DIAGNOSIS — G8929 Other chronic pain: Secondary | ICD-10-CM | POA: Insufficient documentation

## 2017-06-26 DIAGNOSIS — R1030 Lower abdominal pain, unspecified: Secondary | ICD-10-CM | POA: Insufficient documentation

## 2017-06-26 DIAGNOSIS — N926 Irregular menstruation, unspecified: Secondary | ICD-10-CM | POA: Insufficient documentation

## 2017-06-26 DIAGNOSIS — R1032 Left lower quadrant pain: Secondary | ICD-10-CM

## 2017-06-26 DIAGNOSIS — G43909 Migraine, unspecified, not intractable, without status migrainosus: Secondary | ICD-10-CM | POA: Insufficient documentation

## 2017-06-26 DIAGNOSIS — F419 Anxiety disorder, unspecified: Secondary | ICD-10-CM | POA: Insufficient documentation

## 2017-06-26 DIAGNOSIS — K30 Functional dyspepsia: Secondary | ICD-10-CM

## 2017-06-26 DIAGNOSIS — R1013 Epigastric pain: Secondary | ICD-10-CM | POA: Insufficient documentation

## 2017-06-26 DIAGNOSIS — M5136 Other intervertebral disc degeneration, lumbar region: Secondary | ICD-10-CM | POA: Insufficient documentation

## 2017-06-26 DIAGNOSIS — L259 Unspecified contact dermatitis, unspecified cause: Secondary | ICD-10-CM | POA: Insufficient documentation

## 2017-06-26 DIAGNOSIS — R911 Solitary pulmonary nodule: Secondary | ICD-10-CM | POA: Insufficient documentation

## 2017-06-26 DIAGNOSIS — M5134 Other intervertebral disc degeneration, thoracic region: Secondary | ICD-10-CM | POA: Insufficient documentation

## 2017-06-26 DIAGNOSIS — F17290 Nicotine dependence, other tobacco product, uncomplicated: Secondary | ICD-10-CM | POA: Insufficient documentation

## 2017-06-26 DIAGNOSIS — M549 Dorsalgia, unspecified: Secondary | ICD-10-CM | POA: Insufficient documentation

## 2017-06-26 MED ORDER — CYCLOBENZAPRINE HCL 10 MG PO TABS: 10.0000 mg | ORAL_TABLET | Freq: Two times a day (BID) | ORAL | 0 refills | Status: DC | PRN

## 2017-06-26 MED ORDER — MEDROXYPROGESTERONE ACETATE 10 MG PO TABS: ORAL_TABLET | ORAL | 5 refills | Status: DC

## 2017-06-26 MED ORDER — SERTRALINE HCL 50 MG PO TABS: ORAL_TABLET | ORAL | 3 refills | Status: DC

## 2017-06-26 NOTE — Patient Instructions (Signed)
Start Zoloft as discussed to help decrease stomach symptoms. Use the Flexeril as need for the pain in the groin.  It can cause drowsiness.

## 2017-06-26 NOTE — Progress Notes (Signed)
Patient ID: Natalie Thornton, female    DOB: 31-Mar-1986  MRN: [id number]  CC: Back Pain and Pelvic Pain   Subjective: Natalie Thornton is a 32 y.o. female who presents for f/u visit Her concerns today include:  Pt with hx of anxiety/depression, LBP with DDD in lumbar and thoracic spine, tob dep, dyspepsia  C/o pelvic pain x 3-4 wks Located and points to LT groin area -better with standing. Worse with sitting and laying down.  No initiating factor -no vaginal dischg or itching.  Sexually active with female partner -menses irregular. Comes Q2-3 mths x 1-2 yrs; use to last 3 days but now last up to 2 wks. Last menses was around the 20th last mth.  Has bad back pain and migraines with cycles CT abd/pelvis done 04/2017 did not reveal any fibroids in the uterus  2.  C/o feeling a little short of breath  over past 30 mins.  She thinks she may be coming down with pneumonia.  No recent long distance travel.  She is not on any hormones -no cough or fever.  3.  Saw GI Dr. Henrene Pastor, in follow-up recently for chronic abdominal pain.  Pt has had completed workup including EGD.  Treated for H. pylori several times last year.  Dr. Henrene Pastor think she has a component of functional abdominal pain and recommends trial of SSRI. patient is agreeable to doing so Patient Active Problem List   Diagnosis Date Noted  . Diabetes mellitus screening 01/06/2017  . Tobacco abuse 01/06/2017  . Anxiety and depression 01/06/2017  . Dyspnea on exertion 10/20/2016  . Cigarette smoker 10/20/2016  . Solitary pulmonary nodule 10/20/2016  . Acute contact dermatitis 10/20/2016  . Pilonidal sinus 07/05/2011     Current Outpatient Medications on File Prior to Visit  Medication Sig Dispense Refill  . methocarbamol (ROBAXIN) 500 MG tablet Take 500 mg by mouth once.     No current facility-administered medications on file prior to visit.     No Known Allergies  Social History   Socioeconomic History  . Marital status:  Married    Spouse name: Not on file  . Number of children: 2  . Years of education: Not on file  . Highest education level: Not on file  Social Needs  . Financial resource strain: Not on file  . Food insecurity - worry: Not on file  . Food insecurity - inability: Not on file  . Transportation needs - medical: Not on file  . Transportation needs - non-medical: Not on file  Occupational History  . Occupation: [employer]   Tobacco Use  . Smoking status: Current Every Day Smoker    Packs/day: 5.00    Years: 10.00    Pack years: 50.00    Types: Cigars  . Smokeless tobacco: Never Used  . Tobacco comment: tobacco info given 03/14/17  Substance and Sexual Activity  . Alcohol use: Yes    Comment: occasionally  . Drug use: No  . Sexual activity: Not on file  Other Topics Concern  . Not on file  Social History Narrative  . Not on file    Family History  Problem Relation Age of Onset  . AAA (abdominal aortic aneurysm) Maternal Grandmother   . Leukemia Maternal Grandmother   . Breast cancer Maternal Grandmother   . Head & neck cancer Paternal Grandmother        Back cancer  . Lymphoma Paternal Grandmother   . Cirrhosis Maternal Uncle   . Breast  cancer Other   . Colon cancer Neg Hx   . Esophageal cancer Neg Hx   . Rectal cancer Neg Hx   . Stomach cancer Neg Hx     Past Surgical History:  Procedure Laterality Date  . COLONOSCOPY      ROS: Review of Systems Negative except as above PHYSICAL EXAM: BP 118/85   Pulse 72   Temp 98.6 F (37 C) (Oral)   Resp 16   Ht 4\' 11"  (1.499 m)   Wt 147 lb (66.7 kg)   LMP 05/26/2017   SpO2 99%   BMI 29.69 kg/m   Physical Exam General appearance - alert, well appearing, and in no distress Mental status - alert, oriented to person, place, and time, normal mood, behavior, speech, dress, motor activity, and thought processes Chest - clear to auscultation, no wheezes, rales or rhonchi, symmetric air entry Heart - normal rate,  regular rhythm, normal S1, S2, no murmurs, rubs, clicks or gallops Abdomen -normal bowel sounds.  Soft and nontender. Groin: Mild tenderness on palpation of the left groin area.  Discomfort with passive range of motion of the left hip especially with flexion.  No inguinal hernia noted.  No inguinal lymphadenopathy.    ASSESSMENT AND PLAN: 1. Groin pain, chronic, left Most likely a pulled muscle. Recommend Flexeril as needed.  Warm compresses. - cyclobenzaprine (FLEXERIL) 10 MG tablet; Take 1 tablet (10 mg total) by mouth 2 (two) times daily as needed for muscle spasms.  Dispense: 20 tablet; Refill: 0  2. Irregular menses Likely due to anovulatory cycle. We will check some hormone levels today. Put her on Provera once a day for a week once a month or once every 2 months to allow shedding of the uterine - medroxyPROGESTERone (PROVERA) 10 MG tablet; 1 tab daily the first week of every two months  Dispense: 7 tablet; Refill: 5 - TSH - Follicle stimulating hormone - Luteinizing hormone - Prolactin  3. Functional dyspepsia - sertraline (ZOLOFT) 50 MG tablet; 1/2 tab daily x 2 wks then 1 tab daily  Dispense: 30 tablet; Refill: 3  Patient was given the opportunity to ask questions.  Patient verbalized understanding of the plan and was able to repeat key elements of the plan.   Orders Placed This Encounter  Procedures  . TSH  . Follicle stimulating hormone  . Luteinizing hormone  . Prolactin     Requested Prescriptions   Signed Prescriptions Disp Refills  . sertraline (ZOLOFT) 50 MG tablet 30 tablet 3    Sig: 1/2 tab daily x 2 wks then 1 tab daily  . cyclobenzaprine (FLEXERIL) 10 MG tablet 20 tablet 0    Sig: Take 1 tablet (10 mg total) by mouth 2 (two) times daily as needed for muscle spasms.  . medroxyPROGESTERone (PROVERA) 10 MG tablet 7 tablet 5    Sig: 1 tab daily the first week of every two months    No Follow-up on file.  Karle Plumber, MD, FACP

## 2017-06-26 NOTE — Progress Notes (Signed)
Pt is requesting a chest xray because pt is having a hard time catching her breath  Pt states the gabapentin that was prescribed was helping her sleep but not helping her with the pain  Pt states she is taking muscle relaxer that she got from her mom  Pt states she use to have a chiropractor

## 2017-06-27 LAB — TSH: TSH: 0.762 u[IU]/mL (ref 0.450–4.500)

## 2017-06-27 LAB — FOLLICLE STIMULATING HORMONE: FSH: 2.4 m[IU]/mL

## 2017-06-27 LAB — LUTEINIZING HORMONE: LH: 1.7 m[IU]/mL

## 2017-06-27 LAB — PROLACTIN: PROLACTIN: 13 ng/mL (ref 4.8–23.3)

## 2017-06-29 ENCOUNTER — Encounter (HOSPITAL_COMMUNITY)
Admission: RE | Admit: 2017-06-29 | Discharge: 2017-06-29 | Disposition: A | Discharge status: Home / Self Care | Payer: Medicaid Other | Source: Ambulatory Visit | Attending: Physician Assistant | Admitting: Physician Assistant

## 2017-06-29 DIAGNOSIS — R1010 Upper abdominal pain, unspecified: Secondary | ICD-10-CM | POA: Insufficient documentation

## 2017-06-29 MED ORDER — TECHNETIUM TC 99M SULFUR COLLOID
2.1000 | Freq: Once | INTRAVENOUS | Status: AC | PRN
  Administered 2017-06-29: 2.1 via INTRAVENOUS

## 2017-07-04 ENCOUNTER — Ambulatory Visit
Admission: RE | Admit: 2017-07-04 | Discharge: 2017-07-04 | Disposition: A | Discharge status: Home / Self Care | Payer: Medicaid Other | Source: Ambulatory Visit | Attending: Certified Nurse Midwife | Admitting: Certified Nurse Midwife

## 2017-07-04 ENCOUNTER — Telehealth: Payer: Self-pay | Admitting: Physician Assistant

## 2017-07-04 DIAGNOSIS — N644 Mastodynia: Secondary | ICD-10-CM

## 2017-07-04 NOTE — Telephone Encounter (Signed)
Advised of the normal GES.

## 2017-07-08 ENCOUNTER — Emergency Department (HOSPITAL_COMMUNITY): Payer: Self-pay

## 2017-07-08 ENCOUNTER — Other Ambulatory Visit: Payer: Self-pay

## 2017-07-08 ENCOUNTER — Emergency Department (HOSPITAL_COMMUNITY)
Admission: EM | Admit: 2017-07-08 | Discharge: 2017-07-09 | Disposition: A | Discharge status: Home / Self Care | Payer: Self-pay | Attending: Emergency Medicine | Admitting: Emergency Medicine

## 2017-07-08 ENCOUNTER — Encounter (HOSPITAL_COMMUNITY): Payer: Self-pay | Admitting: Emergency Medicine

## 2017-07-08 DIAGNOSIS — S61411A Laceration without foreign body of right hand, initial encounter: Secondary | ICD-10-CM | POA: Insufficient documentation

## 2017-07-08 DIAGNOSIS — Y999 Unspecified external cause status: Secondary | ICD-10-CM | POA: Insufficient documentation

## 2017-07-08 DIAGNOSIS — Z23 Encounter for immunization: Secondary | ICD-10-CM | POA: Insufficient documentation

## 2017-07-08 DIAGNOSIS — Y929 Unspecified place or not applicable: Secondary | ICD-10-CM | POA: Insufficient documentation

## 2017-07-08 DIAGNOSIS — W268XXA Contact with other sharp object(s), not elsewhere classified, initial encounter: Secondary | ICD-10-CM | POA: Insufficient documentation

## 2017-07-08 DIAGNOSIS — Y93G9 Activity, other involving cooking and grilling: Secondary | ICD-10-CM | POA: Insufficient documentation

## 2017-07-08 DIAGNOSIS — F17210 Nicotine dependence, cigarettes, uncomplicated: Secondary | ICD-10-CM | POA: Insufficient documentation

## 2017-07-08 MED ORDER — TETANUS-DIPHTH-ACELL PERTUSSIS 5-2.5-18.5 LF-MCG/0.5 IM SUSP
0.5000 mL | Freq: Once | INTRAMUSCULAR | Status: AC
  Administered 2017-07-09: 0.5 mL via INTRAMUSCULAR
  Filled 2017-07-08: qty 0.5

## 2017-07-08 MED ORDER — OXYCODONE-ACETAMINOPHEN 5-325 MG PO TABS
1.0000 | ORAL_TABLET | Freq: Once | ORAL | Status: AC
  Administered 2017-07-08: 1 via ORAL
  Filled 2017-07-08: qty 1

## 2017-07-08 MED ORDER — ACETAMINOPHEN 500 MG PO TABS: 1000.0000 mg | ORAL_TABLET | Freq: Once | ORAL | Status: DC

## 2017-07-08 MED ORDER — LIDOCAINE HCL 2 % IJ SOLN
10.0000 mL | Freq: Once | INTRAMUSCULAR | Status: AC
  Administered 2017-07-09: 200 mg via INTRADERMAL
  Filled 2017-07-08: qty 20

## 2017-07-08 MED ORDER — IBUPROFEN 800 MG PO TABS
800.0000 mg | ORAL_TABLET | Freq: Once | ORAL | Status: AC
  Administered 2017-07-08: 800 mg via ORAL
  Filled 2017-07-08: qty 1

## 2017-07-08 NOTE — ED Triage Notes (Signed)
Pt c/o laceration to R thumb after attempting to open a can @ 2100. Pt c/o of excruciating pain

## 2017-07-08 NOTE — ED Provider Notes (Signed)
[facility] EMERGENCY DEPARTMENT Provider Note   CSN: [id number] Arrival date & time: 07/08/17  2202     History   Chief Complaint Chief Complaint  Patient presents with  . Laceration    HPI Natalie Thornton is a 32 y.o. female with history of anxiety, arthritis, and degenerative disc disease presents today for evaluation of acute onset of laceration to the webspace between the right thumb and second digit.  She states that she was attempting to open a can of pickle jalapenos when the sharp edge of the can sliced her skin.  Bleeding is controlled.  She is not on blood thinners.  Tetanus is not up-to-date.  She endorses sharp stinging pain which worsens with palpation and movement.  She endorses numbness and tingling of the thumb.  She states she washed out the wound but has not tried any medicines for her pain.  No fevers or chills.  The history is provided by the patient.    Past Medical History:  Diagnosis Date  . Anxiety   . Arthritis   . DDD (degenerative disc disease)   . Degenerative disc disease, cervical   . Disc disease, degenerative, cervical   . H pylori ulcer   . Pilonidal cyst     Patient Active Problem List   Diagnosis Date Noted  . Diabetes mellitus screening 01/06/2017  . Tobacco abuse 01/06/2017  . Anxiety and depression 01/06/2017  . Dyspnea on exertion 10/20/2016  . Cigarette smoker 10/20/2016  . Solitary pulmonary nodule 10/20/2016  . Acute contact dermatitis 10/20/2016  . Pilonidal sinus 07/05/2011    Past Surgical History:  Procedure Laterality Date  . COLONOSCOPY      OB History    No data available       Home Medications    Prior to Admission medications   Medication Sig Start Date End Date Taking? Authorizing Provider  cephALEXin (KEFLEX) 500 MG capsule Take 1 capsule (500 mg total) by mouth 4 (four) times daily for 5 days. 07/09/17 07/14/17  Rodell Perna A, PA-C  cyclobenzaprine (FLEXERIL) 10 MG tablet Take 1 tablet  (10 mg total) by mouth 2 (two) times daily as needed for muscle spasms. 06/26/17   Ladell Pier, MD  medroxyPROGESTERone (PROVERA) 10 MG tablet 1 tab daily the first week of every two months 06/26/17   Ladell Pier, MD  methocarbamol (ROBAXIN) 500 MG tablet Take 500 mg by mouth once.    [provider]  sertraline (ZOLOFT) 50 MG tablet 1/2 tab daily x 2 wks then 1 tab daily 06/26/17   Ladell Pier, MD    Family History Family History  Problem Relation Age of Onset  . AAA (abdominal aortic aneurysm) Maternal Grandmother   . Leukemia Maternal Grandmother   . Breast cancer Maternal Grandmother   . Head & neck cancer Paternal Grandmother        Back cancer  . Lymphoma Paternal Grandmother   . Cirrhosis Maternal Uncle   . Breast cancer Other   . Colon cancer Neg Hx   . Esophageal cancer Neg Hx   . Rectal cancer Neg Hx   . Stomach cancer Neg Hx     Social History Social History   Tobacco Use  . Smoking status: Current Every Day Smoker    Packs/day: 5.00    Years: 10.00    Pack years: 50.00    Types: Cigars  . Smokeless tobacco: Never Used  . Tobacco comment: tobacco info given  03/14/17  Substance Use Topics  . Alcohol use: Yes    Comment: occasionally  . Drug use: No     Allergies   Patient has no known allergies.   Review of Systems Review of Systems  Constitutional: Negative for chills and fever.  Skin: Positive for wound.  Neurological: Positive for numbness. Negative for syncope and weakness.     Physical Exam Updated Vital Signs BP 121/82   Pulse 81   Temp 98.7 F (37.1 C) (Oral)   Resp 16   Ht 4\' 11"  (1.499 m)   Wt 66.2 kg (146 lb)   LMP 05/27/2017   SpO2 100%   BMI 29.49 kg/m   Physical Exam  Constitutional: She appears well-developed and well-nourished. No distress.  HENT:  Head: Normocephalic and atraumatic.  Eyes: Conjunctivae are normal. Right eye exhibits no discharge. Left eye exhibits no discharge.  Neck: No JVD  present. No tracheal deviation present.  Cardiovascular: Normal rate and intact distal pulses.  2+ radial pulses bilaterally  Pulmonary/Chest: Effort normal.  Abdominal: She exhibits no distension.  Musculoskeletal: Normal range of motion. She exhibits no edema.  Normal range of motion of the digits, 5/5 strength of digits with flexion and extension against resistance.  No swelling, erythema, deformity, crepitus.  Tenderness to palpation overlying laceration as described in the skin section.  No snuffbox tenderness.  Neurological: She is alert. No sensory deficit.  Sensation intact to soft touch of bilateral hands.  Good grip strength bilaterally  Skin: Skin is warm and dry. No erythema.  3 cm linear laceration in the webspace between the right thumb and first digit.  Bleeding is controlled.  Tender to palpation.  Psychiatric: She has a normal mood and affect. Her behavior is normal.  Nursing note and vitals reviewed.    ED Treatments / Results  Labs (all labs ordered are listed, but only abnormal results are displayed) Labs Reviewed - No data to display  EKG  EKG Interpretation None       Radiology Dg Finger Thumb Right  Result Date: 07/08/2017 CLINICAL DATA:  Right thumb laceration. EXAM: RIGHT THUMB 2+V COMPARISON:  None. FINDINGS: Small bone fragment is seen along volar aspect of proximal base of first distal phalanx consistent with fracture of indeterminate age. There is no evidence of arthropathy or other focal bone abnormality. No radiopaque foreign body is noted. Soft tissue gas is seen adjacent to first metacarpophalangeal joint consistent with laceration or injury. IMPRESSION: Small bone fragment seen along volar aspect of proximal base of first distal phalanx consistent with fracture of indeterminate age. Electronically Signed   By: Marijo Conception, M.D.   On: 07/08/2017 23:11    Procedures .Marland KitchenLaceration Repair Date/Time: 07/09/2017 12:27 AM Performed by: Renita Papa,  PA-C Authorized by: Renita Papa, PA-C   Consent:    Consent obtained:  Verbal   Consent given by:  Patient   Risks discussed:  Infection, pain, poor cosmetic result and poor wound healing Anesthesia (see MAR for exact dosages):    Anesthesia method:  Local infiltration   Local anesthetic:  Lidocaine 2% w/o epi Laceration details:    Location:  Hand   Hand location: webspace between first and second digits.   Length (cm):  3   Depth (mm):  3 Repair type:    Repair type:  Simple Pre-procedure details:    Preparation:  Patient was prepped and draped in usual sterile fashion and imaging obtained to evaluate for foreign bodies Exploration:  Hemostasis achieved with:  Direct pressure   Wound exploration: wound explored through full range of motion and entire depth of wound probed and visualized     Wound extent: areolar tissue violated     Wound extent: no nerve damage noted and no underlying fracture noted     Contaminated: no   Treatment:    Area cleansed with:  Saline and Betadine   Amount of cleaning:  Extensive   Irrigation solution:  Sterile saline   Irrigation method:  Pressure wash   Visualized foreign bodies/material removed: no   Skin repair:    Repair method:  Sutures   Suture size:  4-0   Suture material:  Prolene   Suture technique:  Simple interrupted   Number of sutures:  5 Approximation:    Approximation:  Close   Vermilion border: well-aligned   Post-procedure details:    Dressing:  Non-adherent dressing and splint for protection   Patient tolerance of procedure:  Tolerated well, no immediate complications   (including critical care time)  Medications Ordered in ED Medications  cephALEXin (KEFLEX) capsule 500 mg (not administered)  oxyCODONE-acetaminophen (PERCOCET/ROXICET) 5-325 MG per tablet 1 tablet (1 tablet Oral Given 07/08/17 2311)  ibuprofen (ADVIL,MOTRIN) tablet 800 mg (800 mg Oral Given 07/08/17 2342)  lidocaine (XYLOCAINE) 2 % (with pres)  injection 200 mg (200 mg Intradermal Given 07/09/17 0008)  Tdap (BOOSTRIX) injection 0.5 mL (0.5 mLs Intramuscular Given 07/09/17 0011)     Initial Impression / Assessment and Plan / ED Course  I have reviewed the triage vital signs and the nursing notes.  Pertinent labs & imaging results that were available during my care of the patient were reviewed by me and considered in my medical decision making (see chart for details).     Patient with laceration to the webspace between the first and second digits of the right hand.  Afebrile, vital signs are stable.  She is neurovascularly intact.  Good grip strength.  Radiographs show a small bone fragment seen along the volar aspect of the proximal base of the first distal phalanx consistent with a fracture of indeterminate age, however patient has no tenderness to palpation overlying this area.  I doubt acute fracture.  Pressure irrigation performed. Wound explored and base of wound visualized in a bloodless field without evidence of foreign body.  Laceration occurred < 8 hours prior to repair which was well tolerated.  Tdap updated.  Pt has no comorbidities to effect normal wound healing. Pt discharged with antibiotics.  Discussed suture home care with patient and answered questions. Pt to follow-up for wound check and suture removal in 7 days; she is to return to the ED sooner for signs of infection. Pt is hemodynamically stable with no complaints prior to dc.  Patient and patient's family verbalized understanding of and agreement with plan and patient is stable for discharge at this time.    Final Clinical Impressions(s) / ED Diagnoses   Final diagnoses:  Laceration of right hand without foreign body, initial encounter    ED Discharge Orders        Ordered    cephALEXin (KEFLEX) 500 MG capsule  4 times daily     07/09/17 0026       Renita Papa, PA-C 07/09/17 0032    Valarie Merino, MD 07/09/17 [phone]

## 2017-07-08 NOTE — ED Notes (Signed)
Patient transported to X-ray 

## 2017-07-09 MED ORDER — CEPHALEXIN 250 MG PO CAPS
500.0000 mg | ORAL_CAPSULE | Freq: Once | ORAL | Status: AC
  Administered 2017-07-09: 500 mg via ORAL
  Filled 2017-07-09: qty 2

## 2017-07-09 MED ORDER — CEPHALEXIN 500 MG PO CAPS: 500.0000 mg | ORAL_CAPSULE | Freq: Four times a day (QID) | ORAL | 0 refills | Status: AC

## 2017-07-09 NOTE — ED Notes (Signed)
ED Provider at bedside. 

## 2017-07-09 NOTE — Discharge Instructions (Signed)
1. Medications: Alternate 600 mg of ibuprofen and [phone] mg of Tylenol every 3 hours as needed for pain. Do not exceed 4000 mg of Tylenol daily. Please take all of your antibiotics until finished!   You may develop abdominal discomfort or diarrhea from the antibiotic.  You may help offset this with probiotics which you can buy or get in yogurt. Do not eat  or take the probiotics until 2 hours after your antibiotic.   2. Treatment: ice for swelling, keep wound clean with warm soap and water and keep bandage dry, do not submerge in water for 24 hours 3. Follow Up: Please return in 7-8 days to have your stitches/staples removed or sooner if you have concerns. Return to the emergency department for increased redness, drainage of pus from the wound   WOUND CARE  Keep area clean and dry for 24 hours. Do not remove bandage, if applied.  After 24 hours, remove bandage and wash wound gently with mild soap and warm water. Reapply a new bandage after cleaning wound, if directed.   Continue daily cleansing with soap and water until stitches/staples are removed.  Do not apply any ointments or creams to the wound while stitches/staples are in place, as this may cause delayed healing. Return if you experience any of the following signs of infection: Swelling, redness, pus drainage, streaking, fever >101.0 F  Return if you experience excessive bleeding that does not stop after 15-20 minutes of constant, firm pressure.

## 2017-07-17 ENCOUNTER — Emergency Department (HOSPITAL_COMMUNITY)
Admission: EM | Admit: 2017-07-17 | Discharge: 2017-07-17 | Disposition: A | Discharge status: Home / Self Care | Payer: Medicaid Other | Attending: Emergency Medicine | Admitting: Emergency Medicine

## 2017-07-17 ENCOUNTER — Encounter (HOSPITAL_COMMUNITY): Payer: Self-pay

## 2017-07-17 ENCOUNTER — Other Ambulatory Visit: Payer: Self-pay

## 2017-07-17 DIAGNOSIS — Z79899 Other long term (current) drug therapy: Secondary | ICD-10-CM | POA: Insufficient documentation

## 2017-07-17 DIAGNOSIS — F17210 Nicotine dependence, cigarettes, uncomplicated: Secondary | ICD-10-CM | POA: Insufficient documentation

## 2017-07-17 DIAGNOSIS — Z4802 Encounter for removal of sutures: Secondary | ICD-10-CM | POA: Insufficient documentation

## 2017-07-17 NOTE — ED Notes (Signed)
Pt in room

## 2017-07-17 NOTE — ED Notes (Signed)
Pt refusing to have [place] PA remove stitches or be seen by Merry Proud, will move to another open room when available.

## 2017-07-17 NOTE — ED Notes (Signed)
ED Provider at bedside. 

## 2017-07-17 NOTE — Discharge Instructions (Signed)
You may continue to use Tylenol and ibuprofen as needed for pain. Follow-up with your primary care doctor if it still continues to bother you in a week. Return to the emergency room if you develop fevers, chills, pus draining from the cut, or any new or worsening symptoms.

## 2017-07-17 NOTE — ED Notes (Signed)
Suture removal kit at bedside 

## 2017-07-17 NOTE — ED Triage Notes (Signed)
Per Pt, Pt is coming from work to have her stitches removed.

## 2017-07-17 NOTE — ED Provider Notes (Signed)
[facility] EMERGENCY DEPARTMENT Provider Note   CSN: [id number] Arrival date & time: 07/17/17  1142     History   Chief Complaint Chief Complaint  Patient presents with  . Suture / Staple Removal    HPI Natalie Thornton is a 32 y.o. female presenting for suture removal.  Patient states she is presenting for his sutures to be removed.  She denies drainage from the cut.  She reports it is still tender, but she has been hitting it while at work.  One stitch fell out the day after it was placed, but the other 4 are present.  She denies fevers, chills, nausea, or vomiting.   HPI  Past Medical History:  Diagnosis Date  . Anxiety   . Arthritis   . DDD (degenerative disc disease)   . Degenerative disc disease, cervical   . Disc disease, degenerative, cervical   . H pylori ulcer   . Pilonidal cyst     Patient Active Problem List   Diagnosis Date Noted  . Diabetes mellitus screening 01/06/2017  . Tobacco abuse 01/06/2017  . Anxiety and depression 01/06/2017  . Dyspnea on exertion 10/20/2016  . Cigarette smoker 10/20/2016  . Solitary pulmonary nodule 10/20/2016  . Acute contact dermatitis 10/20/2016  . Pilonidal sinus 07/05/2011    Past Surgical History:  Procedure Laterality Date  . COLONOSCOPY      OB History    No data available       Home Medications    Prior to Admission medications   Medication Sig Start Date End Date Taking? Authorizing Provider  cyclobenzaprine (FLEXERIL) 10 MG tablet Take 1 tablet (10 mg total) by mouth 2 (two) times daily as needed for muscle spasms. 06/26/17   Ladell Pier, MD  medroxyPROGESTERone (PROVERA) 10 MG tablet 1 tab daily the first week of every two months 06/26/17   Ladell Pier, MD  methocarbamol (ROBAXIN) 500 MG tablet Take 500 mg by mouth once.    [provider]  sertraline (ZOLOFT) 50 MG tablet 1/2 tab daily x 2 wks then 1 tab daily 06/26/17   Ladell Pier, MD    Family  History Family History  Problem Relation Age of Onset  . AAA (abdominal aortic aneurysm) Maternal Grandmother   . Leukemia Maternal Grandmother   . Breast cancer Maternal Grandmother   . Head & neck cancer Paternal Grandmother        Back cancer  . Lymphoma Paternal Grandmother   . Cirrhosis Maternal Uncle   . Breast cancer Other   . Colon cancer Neg Hx   . Esophageal cancer Neg Hx   . Rectal cancer Neg Hx   . Stomach cancer Neg Hx     Social History Social History   Tobacco Use  . Smoking status: Current Every Day Smoker    Packs/day: 5.00    Years: 10.00    Pack years: 50.00    Types: Cigars  . Smokeless tobacco: Never Used  . Tobacco comment: tobacco info given 03/14/17  Substance Use Topics  . Alcohol use: Yes    Comment: occasionally  . Drug use: No     Allergies   Patient has no known allergies.   Review of Systems Review of Systems  Constitutional: Negative for chills and fever.  Skin: Positive for wound.  Hematological: Does not bruise/bleed easily.     Physical Exam Updated Vital Signs BP 118/74 (BP Location: Left Arm)   Pulse 74  Temp 98.8 F (37.1 C) (Oral)   Resp 12   Ht 4\' 11"  (1.499 m)   Wt 66.2 kg (146 lb)   LMP 07/13/2017   SpO2 96%   BMI 29.49 kg/m   Physical Exam  Constitutional: She is oriented to person, place, and time. She appears well-developed and well-nourished. No distress.  HENT:  Head: Normocephalic and atraumatic.  Eyes: EOM are normal.  Neck: Normal range of motion.  Pulmonary/Chest: Effort normal.  Abdominal: She exhibits no distension.  Musculoskeletal: Normal range of motion.  Neurological: She is alert and oriented to person, place, and time.  Skin: Skin is warm. No rash noted.  Well-healing laceration at the base of the right thumb with 4 sutures visible.  No drainage or surrounding erythema.  No significant swelling.  Full active range of motion of the thumb.  Sensation intact.  Radial pulses equal  bilaterally.  Psychiatric: She has a normal mood and affect.  Nursing note and vitals reviewed.    ED Treatments / Results  Labs (all labs ordered are listed, but only abnormal results are displayed) Labs Reviewed - No data to display  EKG  EKG Interpretation None       Radiology No results found.  Procedures .Suture Removal Date/Time: 07/17/2017 1:30 PM Performed by: Franchot Heidelberg, PA-C Authorized by: Franchot Heidelberg, PA-C   Consent:    Consent obtained:  Verbal   Consent given by:  Patient   Risks discussed:  Pain, bleeding and wound separation   Alternatives discussed:  No treatment Location:    Location:  Upper extremity   Upper extremity location:  Hand   Hand location:  R thumb Procedure details:    Wound appearance:  No signs of infection, good wound healing and clean   Number of sutures removed:  4 Post-procedure details:    Post-removal:  No dressing applied   Patient tolerance of procedure:  Tolerated well, no immediate complications   (including critical care time)  Medications Ordered in ED Medications - No data to display   Initial Impression / Assessment and Plan / ED Course  I have reviewed the triage vital signs and the nursing notes.  Pertinent labs & imaging results that were available during my care of the patient were reviewed by me and considered in my medical decision making (see chart for details).     Patient presenting for evaluation of suture removal.  Physical exam reassuring, she is afebrile not tachycardic.  No signs of cellulitis.  Doubt infection.  Wound appears to be healing well.  Patient concerned about pain, I offered lidocaine infiltration, but patient does not want this.  Sutures successfully removed, and wound remained together.  Discussed follow-up with primary care as needed.  At this time, patient appears safe for discharge.  Return precautions given.  Patient states he understands and agrees to plan.   Final  Clinical Impressions(s) / ED Diagnoses   Final diagnoses:  Visit for suture removal    ED Discharge Orders    None       Franchot Heidelberg, PA-C 07/17/17 1334    Jola Schmidt, MD 07/17/17 1656

## 2017-07-17 NOTE — ED Provider Notes (Signed)
Patient placed in Quick Look pathway, seen and evaluated   Chief Complaint: suture removal  HPI:   32 year old female presents today for suture removal.  She had 5 sutures placed in the interdigital space of the right hand.  Patient notes ongoing discomfort  ROS: Hand pain (one)  Physical Exam:   Gen: No distress  Neuro: Awake and Alert  Skin: Warm    Focused Exam: Stitches interdigital space right hand  32 year old female presents today for suture removal.  Patient would not let me attempt suture removal as she reports it hurts, she has no signs of redness swelling infection.  I offered to anesthetize the area with lidocaine, she refused this, I offered her Tylenol ibuprofen she refused this and requested pain medication.  Patient again question my judgment on using narcotics, I do not feel this is appropriate, again offered anesthesia.  Patient reports that she did not want to be seen by me.  Patient will be placed in the waiting room until a bed is available for her to be seen by another provider.   Initiation of care has begun. The patient has been counseled on the process, plan, and necessity for staying for the completion/evaluation, and the remainder of the medical screening examination    Francee Gentile 07/17/17 [place], Kevin, MD 07/17/17 [phone]

## 2017-07-24 ENCOUNTER — Encounter: Payer: Self-pay | Admitting: Internal Medicine

## 2017-07-31 ENCOUNTER — Other Ambulatory Visit: Payer: Self-pay

## 2017-07-31 ENCOUNTER — Encounter (HOSPITAL_COMMUNITY): Payer: Self-pay

## 2017-07-31 ENCOUNTER — Emergency Department (HOSPITAL_COMMUNITY)
Admission: EM | Admit: 2017-07-31 | Discharge: 2017-07-31 | Disposition: A | Discharge status: Home / Self Care | Payer: Medicaid Other | Attending: Emergency Medicine | Admitting: Emergency Medicine

## 2017-07-31 DIAGNOSIS — M545 Low back pain: Secondary | ICD-10-CM | POA: Insufficient documentation

## 2017-07-31 DIAGNOSIS — Z5321 Procedure and treatment not carried out due to patient leaving prior to being seen by health care provider: Secondary | ICD-10-CM | POA: Insufficient documentation

## 2017-07-31 NOTE — ED Triage Notes (Signed)
Pt presents with complaints of lower back pain ongoing for "awhile". Pt also states that she has a lump on her upper back that you cant see, only feel. And another lump on the right middle part of her back.  Ambulatory.

## 2017-07-31 NOTE — ED Notes (Signed)
Pt called for room, no answer.

## 2017-08-01 ENCOUNTER — Emergency Department (HOSPITAL_COMMUNITY)
Admission: EM | Admit: 2017-08-01 | Discharge: 2017-08-01 | Disposition: A | Discharge status: Home / Self Care | Payer: Medicaid Other | Attending: Emergency Medicine | Admitting: Emergency Medicine

## 2017-08-01 ENCOUNTER — Other Ambulatory Visit: Payer: Self-pay

## 2017-08-01 ENCOUNTER — Emergency Department (HOSPITAL_COMMUNITY): Payer: Medicaid Other

## 2017-08-01 ENCOUNTER — Encounter (HOSPITAL_COMMUNITY): Payer: Self-pay | Admitting: Emergency Medicine

## 2017-08-01 DIAGNOSIS — Z79899 Other long term (current) drug therapy: Secondary | ICD-10-CM | POA: Insufficient documentation

## 2017-08-01 DIAGNOSIS — F17290 Nicotine dependence, other tobacco product, uncomplicated: Secondary | ICD-10-CM | POA: Insufficient documentation

## 2017-08-01 DIAGNOSIS — F419 Anxiety disorder, unspecified: Secondary | ICD-10-CM | POA: Insufficient documentation

## 2017-08-01 DIAGNOSIS — F329 Major depressive disorder, single episode, unspecified: Secondary | ICD-10-CM | POA: Insufficient documentation

## 2017-08-01 DIAGNOSIS — D179 Benign lipomatous neoplasm, unspecified: Secondary | ICD-10-CM

## 2017-08-01 DIAGNOSIS — M7918 Myalgia, other site: Secondary | ICD-10-CM

## 2017-08-01 NOTE — Discharge Instructions (Signed)
Please read attached information. If you experience any new or worsening signs or symptoms please return to the emergency room for evaluation. Please follow-up with your primary care provider or specialist as discussed.  °

## 2017-08-01 NOTE — ED Triage Notes (Addendum)
Pt reports soft lump over R scapula area; RN palpated and soft lump felt. She was here yesterday. Upper back pain just starting but states lower back been hurting for while. Hurts with movement, walking, and especially worse with bending over. She states feels like locks up and has to wait till that feeling is gone to move again. Has productive cough on and off. Nonsmoker.

## 2017-08-01 NOTE — ED Notes (Signed)
VSS. Pt ambulatory to lobby with steady gait. Pt verbalized understanding of all d/c instructions.

## 2017-08-01 NOTE — ED Provider Notes (Signed)
[facility] EMERGENCY DEPARTMENT Provider Note   CSN: [id number] Arrival date & time: 08/01/17  1224   History   Chief Complaint Chief Complaint  Patient presents with  . Back Pain    HPI Natalie Thornton is a 32 y.o. female.  HPI   32 year old female presents today with complaints of mass on her right ribs.  She notes is been there for 3 days nontender no redness, discharge or warmth to touch.  No history of the same.  Patient also reports that she has had some pain in her upper thoracic back worse with movement, has no pain at the time of my evaluation, no other complaints.  Past Medical History:  Diagnosis Date  . Anxiety   . Arthritis   . DDD (degenerative disc disease)   . Degenerative disc disease, cervical   . Disc disease, degenerative, cervical   . H pylori ulcer   . Pilonidal cyst     Patient Active Problem List   Diagnosis Date Noted  . Diabetes mellitus screening 01/06/2017  . Tobacco abuse 01/06/2017  . Anxiety and depression 01/06/2017  . Dyspnea on exertion 10/20/2016  . Cigarette smoker 10/20/2016  . Solitary pulmonary nodule 10/20/2016  . Acute contact dermatitis 10/20/2016  . Pilonidal sinus 07/05/2011    Past Surgical History:  Procedure Laterality Date  . COLONOSCOPY      OB History    No data available       Home Medications    Prior to Admission medications   Medication Sig Start Date End Date Taking? Authorizing Provider  cyclobenzaprine (FLEXERIL) 10 MG tablet Take 1 tablet (10 mg total) by mouth 2 (two) times daily as needed for muscle spasms. 06/26/17   Ladell Pier, MD  medroxyPROGESTERone (PROVERA) 10 MG tablet 1 tab daily the first week of every two months 06/26/17   Ladell Pier, MD  methocarbamol (ROBAXIN) 500 MG tablet Take 500 mg by mouth once.    [provider]  sertraline (ZOLOFT) 50 MG tablet 1/2 tab daily x 2 wks then 1 tab daily 06/26/17   Ladell Pier, MD    Family  History Family History  Problem Relation Age of Onset  . AAA (abdominal aortic aneurysm) Maternal Grandmother   . Leukemia Maternal Grandmother   . Breast cancer Maternal Grandmother   . Head & neck cancer Paternal Grandmother        Back cancer  . Lymphoma Paternal Grandmother   . Cirrhosis Maternal Uncle   . Breast cancer Other   . Colon cancer Neg Hx   . Esophageal cancer Neg Hx   . Rectal cancer Neg Hx   . Stomach cancer Neg Hx     Social History Social History   Tobacco Use  . Smoking status: Current Every Day Smoker    Packs/day: 5.00    Years: 10.00    Pack years: 50.00    Types: Cigars  . Smokeless tobacco: Never Used  . Tobacco comment: tobacco info given 03/14/17  Substance Use Topics  . Alcohol use: Yes    Comment: occasionally  . Drug use: No     Allergies   Patient has no known allergies.   Review of Systems Review of Systems  All other systems reviewed and are negative.    Physical Exam Updated Vital Signs BP 115/62 (BP Location: Left Arm)   Pulse 75   Temp 98.6 F (37 C) (Oral)   Resp 16   LMP  07/13/2017   SpO2 100%   Physical Exam  Constitutional: She is oriented to person, place, and time. She appears well-developed and well-nourished.  HENT:  Head: Normocephalic and atraumatic.  Eyes: Conjunctivae are normal. Pupils are equal, round, and reactive to light. Right eye exhibits no discharge. Left eye exhibits no discharge. No scleral icterus.  Neck: Normal range of motion. No JVD present. No tracheal deviation present.  Pulmonary/Chest: Effort normal. No stridor.  Musculoskeletal:  No CT or L-spine tenderness palpation-3 cm area of soft firm swelling on the right lateral posterior ribs no redness, no fluctuance, no tenderness to palpation, mobile  Neurological: She is alert and oriented to person, place, and time. Coordination normal.  Psychiatric: She has a normal mood and affect. Her behavior is normal. Judgment and thought content  normal.  Nursing note and vitals reviewed.    ED Treatments / Results  Labs (all labs ordered are listed, but only abnormal results are displayed) Labs Reviewed - No data to display  EKG  EKG Interpretation None       Radiology Dg Chest 2 View  Result Date: 08/01/2017 CLINICAL DATA:  Productive cough intermittently. Upper back pain. Former smoker. History of diabetes EXAM: CHEST  2 VIEW COMPARISON:  Chest x-ray of December 01, 2016 FINDINGS: The lungs are adequately inflated and clear. The heart and pulmonary vascularity are normal. The mediastinum is normal in width. There is no pleural effusion. The trachea is midline. The visualized portions of the thoracic spine and ribs are unremarkable. IMPRESSION: There is no active cardiopulmonary disease. Electronically Signed   By: David  Martinique M.D.   On: 08/01/2017 16:05   Dg Thoracic Spine 2 View  Result Date: 08/01/2017 CLINICAL DATA:  Back pain. EXAM: THORACIC SPINE 2 VIEWS COMPARISON:  Chest CT 10/20/2016 FINDINGS: Thirteen rib-bearing vertebrae. There is mild anterior endplate spurring at Q0-3. Generalized narrow appearance of the discs. No evidence of fracture, endplate erosion, or bone lesion. IMPRESSION: No acute finding or change from 2018. Electronically Signed   By: [name] M.D.   On: 08/01/2017 16:07   Dg Lumbar Spine Complete  Result Date: 08/01/2017 CLINICAL DATA:  Palpable soft tissue lump over the right scapula, some back pain EXAM: LUMBAR SPINE - COMPLETE 4+ VIEW COMPARISON:  CT abdomen pelvis of 04/15/2017 FINDINGS: As noted on prior CT the abdomen pelvis there are 4 non rib-bearing lumbar vertebra. There is degenerative disc disease at L4-sacral interspace with loss of disc space and sclerosis with spurring. The remainder of intervertebral disc spaces appear normal. No compression deformity is seen. The SI joints appear corticated. IMPRESSION: 1. The lumbar vertebrae are normal alignment. 2. Four non rib-bearing lumbar  vertebra with degenerative disc disease at L4/sacrum. Electronically Signed   By: Ivar Drape M.D.   On: 08/01/2017 16:08    Procedures Procedures (including critical care time)  Medications Ordered in ED Medications - No data to display   Initial Impression / Assessment and Plan / ED Course  I have reviewed the triage vital signs and the nursing notes.  Pertinent labs & imaging results that were available during my care of the patient were reviewed by me and considered in my medical decision making (see chart for details).     Final Clinical Impressions(s) / ED Diagnoses   Final diagnoses:  Lipoma, unspecified site  Musculoskeletal pain   Labs:   Imaging: DG chest 2 view, DG thoracic, DG lumbar(image ordered prior to my evaluation)   Consults:  Therapeutics:  Discharge Meds:   Assessment/Plan: Patient's presentation is most consistent with lipoma.  Bedside ultrasound performed here no signs of fluid collection or abscess.  Plain films without acute bony abnormality.  Patient reports some upper thoracic pain, none presently, no neurological deficits.  I encouraged patient to follow-up as an outpatient with primary care for reevaluation, she is given strict return precautions, she verbalized understanding and agreement to today's plan.      ED Discharge Orders    None       Okey Regal, Hershal Coria 08/01/17 1814    Fredia Sorrow, MD 08/02/17 [phone]

## 2017-08-01 NOTE — ED Notes (Signed)
Pt given ginger ale.

## 2017-08-03 ENCOUNTER — Encounter: Payer: Self-pay | Admitting: Internal Medicine

## 2017-08-03 DIAGNOSIS — G8929 Other chronic pain: Secondary | ICD-10-CM

## 2017-08-03 DIAGNOSIS — M545 Low back pain: Principal | ICD-10-CM

## 2017-08-07 ENCOUNTER — Encounter: Payer: Self-pay | Admitting: Internal Medicine

## 2017-08-07 NOTE — Telephone Encounter (Signed)
Patient request

## 2017-08-14 ENCOUNTER — Encounter: Payer: Self-pay | Admitting: Physician Assistant

## 2017-08-15 ENCOUNTER — Emergency Department (HOSPITAL_COMMUNITY)
Admission: EM | Admit: 2017-08-15 | Discharge: 2017-08-15 | Disposition: A | Discharge status: Home / Self Care | Payer: Medicaid Other | Attending: Emergency Medicine | Admitting: Emergency Medicine

## 2017-08-15 ENCOUNTER — Encounter (HOSPITAL_COMMUNITY): Payer: Self-pay | Admitting: Emergency Medicine

## 2017-08-15 ENCOUNTER — Other Ambulatory Visit: Payer: Self-pay

## 2017-08-15 ENCOUNTER — Encounter: Payer: Self-pay | Admitting: Internal Medicine

## 2017-08-15 DIAGNOSIS — R590 Localized enlarged lymph nodes: Secondary | ICD-10-CM | POA: Insufficient documentation

## 2017-08-15 DIAGNOSIS — Z79899 Other long term (current) drug therapy: Secondary | ICD-10-CM | POA: Insufficient documentation

## 2017-08-15 DIAGNOSIS — E119 Type 2 diabetes mellitus without complications: Secondary | ICD-10-CM | POA: Insufficient documentation

## 2017-08-15 DIAGNOSIS — F17210 Nicotine dependence, cigarettes, uncomplicated: Secondary | ICD-10-CM | POA: Insufficient documentation

## 2017-08-15 NOTE — Discharge Instructions (Signed)
Your evaluation today is reassuring, lump in your groin is likely an enlarged lymph node.  You are not experiencing any infectious symptoms right now this will likely go away on its own.  If you develop fevers or chills, urinary symptoms such as burning with urination or urinary frequency or he develop any vaginal discharge or pelvic discomfort please follow-up with your primary care doctor for reevaluation.  If you develop redness, warmth, pain or drainage from the area follow-up as well.  Get help right away if: You notice fluid leaking from the area of the enlarged lymph node. You have severe pain in any area of your body. You have chest pain. You have shortness of breath.

## 2017-08-15 NOTE — ED Provider Notes (Signed)
[facility] EMERGENCY DEPARTMENT Provider Note   CSN: [id number] Arrival date & time: 08/15/17  1550     History   Chief Complaint Chief Complaint  Patient presents with  . lump in groin    HPI Natalie Thornton is a 32 y.o. female.  Natalie Thornton is a 32 y.o. Female history of anxiety, arthritis, degenerative disc disease, presents to the ED for evaluation lump in her right groin.  She reports she noted this about 2 days ago when standing up and palpating the area.  She denies pain in this area.  No redness, warmth or swelling, no drainage from the area.  She denies any fevers or chills, has not noticed any lumps elsewhere.  Denies any history of abscess or diabetes.  Denies any dysuria, frequency, hematuria, vaginal discharge pelvic pain or discomfort.  She reports she is sexually active but only with women and does not think she has been exposed to an STD recently.      Past Medical History:  Diagnosis Date  . Anxiety   . Arthritis   . DDD (degenerative disc disease)   . Degenerative disc disease, cervical   . Disc disease, degenerative, cervical   . H pylori ulcer   . Pilonidal cyst     Patient Active Problem List   Diagnosis Date Noted  . Diabetes mellitus screening 01/06/2017  . Tobacco abuse 01/06/2017  . Anxiety and depression 01/06/2017  . Dyspnea on exertion 10/20/2016  . Cigarette smoker 10/20/2016  . Solitary pulmonary nodule 10/20/2016  . Acute contact dermatitis 10/20/2016  . Pilonidal sinus 07/05/2011    Past Surgical History:  Procedure Laterality Date  . COLONOSCOPY      OB History    No data available       Home Medications    Prior to Admission medications   Medication Sig Start Date End Date Taking? Authorizing Provider  cyclobenzaprine (FLEXERIL) 10 MG tablet Take 1 tablet (10 mg total) by mouth 2 (two) times daily as needed for muscle spasms. 06/26/17   Ladell Pier, MD  medroxyPROGESTERone (PROVERA) 10 MG  tablet 1 tab daily the first week of every two months 06/26/17   Ladell Pier, MD  methocarbamol (ROBAXIN) 500 MG tablet Take 500 mg by mouth once.    [provider]  sertraline (ZOLOFT) 50 MG tablet 1/2 tab daily x 2 wks then 1 tab daily 06/26/17   Ladell Pier, MD    Family History Family History  Problem Relation Age of Onset  . AAA (abdominal aortic aneurysm) Maternal Grandmother   . Leukemia Maternal Grandmother   . Breast cancer Maternal Grandmother   . Head & neck cancer Paternal Grandmother        Back cancer  . Lymphoma Paternal Grandmother   . Cirrhosis Maternal Uncle   . Breast cancer Other   . Colon cancer Neg Hx   . Esophageal cancer Neg Hx   . Rectal cancer Neg Hx   . Stomach cancer Neg Hx     Social History Social History   Tobacco Use  . Smoking status: Current Every Day Smoker    Packs/day: 5.00    Years: 10.00    Pack years: 50.00    Types: Cigars  . Smokeless tobacco: Never Used  . Tobacco comment: tobacco info given 03/14/17  Substance Use Topics  . Alcohol use: Yes    Comment: occasionally  . Drug use: No     Allergies  Patient has no known allergies.   Review of Systems Review of Systems  Constitutional: Negative for chills and fever.  Gastrointestinal: Negative for abdominal pain, diarrhea, nausea and vomiting.  Genitourinary: Negative for dysuria, frequency, hematuria, menstrual problem, pelvic pain, vaginal bleeding, vaginal discharge and vaginal pain.  Musculoskeletal: Negative for arthralgias and myalgias.  Skin:       Lump in groin     Physical Exam Updated Vital Signs BP 128/85 (BP Location: Right Arm)   Pulse 78   Temp 98.2 F (36.8 C) (Oral)   Resp 14   LMP 07/29/2017   SpO2 98%   Physical Exam  Constitutional: She appears well-developed and well-nourished. No distress.  HENT:  Head: Normocephalic and atraumatic.  Mouth/Throat: Oropharynx is clear and moist.  Eyes: Right eye exhibits no discharge.  Left eye exhibits no discharge.  Neck: Neck supple.  Cardiovascular: Normal rate, regular rhythm and normal heart sounds.  Pulmonary/Chest: Effort normal and breath sounds normal. No respiratory distress.  Abdominal: Soft. Bowel sounds are normal. She exhibits no distension. There is no tenderness.  Genitourinary:  Genitourinary Comments: Palpable enlarged lymph node in the right inguinal region, no surrounding erythema, induration, warmth or fluctuance to suggest abscess, no other external genital lesions noted, no left-sided lymphadenopathy  Lymphadenopathy:    She has no cervical adenopathy. Inguinal adenopathy noted on the right side.  Neurological: She is alert. Coordination normal.  Skin: Skin is warm and dry. She is not diaphoretic.  No axillary lymphadenopathy on exam  Psychiatric: She has a normal mood and affect. Her behavior is normal.  Nursing note and vitals reviewed.    ED Treatments / Results  Labs (all labs ordered are listed, but only abnormal results are displayed) Labs Reviewed - No data to display  EKG  EKG Interpretation None       Radiology No results found.   EMERGENCY DEPARTMENT US SOFT TISSUE INTERPRETATION "Study: Limited Soft Tissue Ultrasound"  INDICATIONS: Lump in groin Multiple views of the body part were obtained in real-time with a multi-frequency linear probe  PERFORMED BY: Myself IMAGES ARCHIVED?: Yes SIDE:Right  BODY PART:Pelvic wall INTERPRETATION:  No abcess noted, No cellulitis noted and Enalrged lymphnode     Procedures Procedures (including critical care time)  Medications Ordered in ED Medications - No data to display   Initial Impression / Assessment and Plan / ED Course  I have reviewed the triage vital signs and the nursing notes.  Pertinent labs & imaging results that were available during my care of the patient were reviewed by me and considered in my medical decision making (see chart for details).  Patient  presents to the ED for evaluation of "lump in groin", exam consistent with enlarged inguinal lymph node on the right side, no left-sided inguinal lymphadenopathy noted.  Lymph node is soft and easily movable, patient is not experiencing any infectious symptoms at this time no urinary symptoms vaginal discharge, abdominal pain, fevers or chills, no lymphadenopathy noted elsewhere.  Bedside ultrasound confirms presence of enlarged lymph node, no evidence of abscess.  Do not feel that this warrants further workup at this time without any symptoms, will have patient follow-up closely with her primary doctor.  Discussed return precautions.  Patient expresses understanding and is in agreement with plan.  Final Clinical Impressions(s) / ED Diagnoses   Final diagnoses:  Inguinal lymphadenopathy    ED Discharge Orders    None       Jacqlyn Larsen, [place] 08/16/17 0205  Tegeler, Gwenyth Allegra, MD 08/16/17 [phone]

## 2017-08-15 NOTE — ED Triage Notes (Signed)
Pt here with c/o lump in right groin-- painless-- no urinary symptoms, no vaginal discharge

## 2017-08-26 ENCOUNTER — Emergency Department (HOSPITAL_COMMUNITY)
Admission: EM | Admit: 2017-08-26 | Discharge: 2017-08-26 | Disposition: A | Discharge status: Home / Self Care | Payer: Medicaid Other | Attending: Emergency Medicine | Admitting: Emergency Medicine

## 2017-08-26 ENCOUNTER — Encounter: Payer: Self-pay | Admitting: Internal Medicine

## 2017-08-26 ENCOUNTER — Other Ambulatory Visit: Payer: Self-pay

## 2017-08-26 ENCOUNTER — Encounter (HOSPITAL_COMMUNITY): Payer: Self-pay | Admitting: Emergency Medicine

## 2017-08-26 DIAGNOSIS — F17290 Nicotine dependence, other tobacco product, uncomplicated: Secondary | ICD-10-CM | POA: Insufficient documentation

## 2017-08-26 DIAGNOSIS — Z79899 Other long term (current) drug therapy: Secondary | ICD-10-CM | POA: Insufficient documentation

## 2017-08-26 DIAGNOSIS — L0501 Pilonidal cyst with abscess: Secondary | ICD-10-CM

## 2017-08-26 LAB — CBC
HCT: 40 % (ref 36.0–46.0)
HEMOGLOBIN: 13.3 g/dL (ref 12.0–15.0)
MCH: 30.8 pg (ref 26.0–34.0)
MCHC: 33.3 g/dL (ref 30.0–36.0)
MCV: 92.6 fL (ref 78.0–100.0)
PLATELETS: 239 10*3/uL (ref 150–400)
RBC: 4.32 MIL/uL (ref 3.87–5.11)
RDW: 12.2 % (ref 11.5–15.5)
WBC: 4 10*3/uL (ref 4.0–10.5)

## 2017-08-26 LAB — URINALYSIS, ROUTINE W REFLEX MICROSCOPIC
Bilirubin Urine: NEGATIVE
Glucose, UA: NEGATIVE mg/dL
Ketones, ur: NEGATIVE mg/dL
Leukocytes, UA: NEGATIVE
Nitrite: NEGATIVE
PROTEIN: NEGATIVE mg/dL
SPECIFIC GRAVITY, URINE: 1.01 (ref 1.005–1.030)
pH: 7 (ref 5.0–8.0)

## 2017-08-26 LAB — WET PREP, GENITAL
Sperm: NONE SEEN
Trich, Wet Prep: NONE SEEN
Yeast Wet Prep HPF POC: NONE SEEN

## 2017-08-26 LAB — COMPREHENSIVE METABOLIC PANEL
ALT: 9 U/L — AB (ref 14–54)
AST: 18 U/L (ref 15–41)
Albumin: 3.9 g/dL (ref 3.5–5.0)
Alkaline Phosphatase: 36 U/L — ABNORMAL LOW (ref 38–126)
Anion gap: 7 (ref 5–15)
BUN: 6 mg/dL (ref 6–20)
CHLORIDE: 104 mmol/L (ref 101–111)
CO2: 25 mmol/L (ref 22–32)
CREATININE: 0.83 mg/dL (ref 0.44–1.00)
Calcium: 9.1 mg/dL (ref 8.9–10.3)
GFR calc Af Amer: >60 mL/min (ref >60)
GFR calc non Af Amer: >60 mL/min (ref >60)
Glucose, Bld: 91 mg/dL (ref 65–99)
Potassium: 4 mmol/L (ref 3.5–5.1)
SODIUM: 136 mmol/L (ref 135–145)
Total Bilirubin: 0.8 mg/dL (ref 0.3–1.2)
Total Protein: 7 g/dL (ref 6.5–8.1)

## 2017-08-26 LAB — RAPID HIV SCREEN (HIV 1/2 AB+AG)
HIV 1/2 ANTIBODIES: NONREACTIVE
HIV-1 P24 ANTIGEN - HIV24: NONREACTIVE

## 2017-08-26 LAB — PREGNANCY, URINE: Preg Test, Ur: NEGATIVE

## 2017-08-26 MED ORDER — LIDOCAINE-EPINEPHRINE (PF) 2 %-1:200000 IJ SOLN
10.0000 mL | Freq: Once | INTRAMUSCULAR | Status: AC
  Administered 2017-08-26: 10 mL via INTRADERMAL
  Filled 2017-08-26: qty 20

## 2017-08-26 MED ORDER — IBUPROFEN 400 MG PO TABS
600.0000 mg | ORAL_TABLET | Freq: Once | ORAL | Status: AC
  Administered 2017-08-26: 600 mg via ORAL
  Filled 2017-08-26: qty 1

## 2017-08-26 NOTE — ED Notes (Signed)
ED Provider at bedside. 

## 2017-08-26 NOTE — ED Provider Notes (Signed)
[facility] EMERGENCY DEPARTMENT Provider Note   CSN: [id number] Arrival date & time: 08/26/17  9833     History   Chief Complaint Chief Complaint  Patient presents with  . Abscess  . Groin Pain  . Headache    HPI Natalie Thornton is a 32 y.o. female.  HPI  Natalie Thornton is a 32yo female with a history of anxiety, arthritis, degenerative disc disease who presents to the emergency department for evaluation of multiple complaints.  Patient reports that she has had pain in the gluteal cleft for the past week now.  Reports yesterday evening she noticed drainage of green pus from the buttocks area.  She reports that she has had a pilonidal abscess in the past which needed I&D in the emergency department.  She denies ever going to general surgery for follow-up.  Patient states she had a fever of 99.0 F at home.  She reports the pain in her buttocks area is 10/10 in severity and worsened when she sits down.  She does not have any pain when standing or walking.  Denies painful bowel movements.  Next, patient reports that she has left groin pain for the past month now.  States that pain comes and goes without known trigger.  Movement does not worsen the pain.  She has not noticed any lumps or bumps in this area.  Pain does not radiate into the abdomen or to the leg.  She states that she is nontender in this area and does not have pain right now but it occasionally occurs.  She reports that she has increased urinary frequency, denies dysuria, abdominal pain, flank pain, nausea/vomiting, diarrhea.  She states that she is sexually active with one female partner who is her wife.  Her wife has had sex with men, and she is asking to be tested for STDs today as well.  Finally, patient states that she wants to be checked for abscess in her left axilla.  Thinks that she might of felt a bump in this area this morning.  Past Medical History:  Diagnosis Date  . Anxiety   . Arthritis   .  DDD (degenerative disc disease)   . Degenerative disc disease, cervical   . Disc disease, degenerative, cervical   . H pylori ulcer   . Pilonidal cyst     Patient Active Problem List   Diagnosis Date Noted  . Diabetes mellitus screening 01/06/2017  . Tobacco abuse 01/06/2017  . Anxiety and depression 01/06/2017  . Dyspnea on exertion 10/20/2016  . Cigarette smoker 10/20/2016  . Solitary pulmonary nodule 10/20/2016  . Acute contact dermatitis 10/20/2016  . Pilonidal sinus 07/05/2011    Past Surgical History:  Procedure Laterality Date  . COLONOSCOPY       OB History   None      Home Medications    Prior to Admission medications   Medication Sig Start Date End Date Taking? Authorizing Provider  cyclobenzaprine (FLEXERIL) 10 MG tablet Take 1 tablet (10 mg total) by mouth 2 (two) times daily as needed for muscle spasms. 06/26/17   Ladell Pier, MD  medroxyPROGESTERone (PROVERA) 10 MG tablet 1 tab daily the first week of every two months 06/26/17   Ladell Pier, MD  methocarbamol (ROBAXIN) 500 MG tablet Take 500 mg by mouth once.    [provider]  sertraline (ZOLOFT) 50 MG tablet 1/2 tab daily x 2 wks then 1 tab daily 06/26/17   Ladell Pier,  MD    Family History Family History  Problem Relation Age of Onset  . AAA (abdominal aortic aneurysm) Maternal Grandmother   . Leukemia Maternal Grandmother   . Breast cancer Maternal Grandmother   . Head & neck cancer Paternal Grandmother        Back cancer  . Lymphoma Paternal Grandmother   . Cirrhosis Maternal Uncle   . Breast cancer Other   . Colon cancer Neg Hx   . Esophageal cancer Neg Hx   . Rectal cancer Neg Hx   . Stomach cancer Neg Hx     Social History Social History   Tobacco Use  . Smoking status: Current Every Day Smoker    Packs/day: 5.00    Years: 10.00    Pack years: 50.00    Types: Cigars  . Smokeless tobacco: Never Used  . Tobacco comment: tobacco info given 03/14/17    Substance Use Topics  . Alcohol use: Yes    Comment: occasionally  . Drug use: No     Allergies   Patient has no known allergies.   Review of Systems Review of Systems  Constitutional: Positive for fever (99.58F at home). Negative for chills.  Gastrointestinal: Negative for abdominal pain, diarrhea, nausea and vomiting.  Genitourinary: Positive for frequency. Negative for difficulty urinating, dysuria, hematuria, pelvic pain and vaginal discharge.  Musculoskeletal: Positive for myalgias (left groin pain). Negative for gait problem and joint swelling.  Skin: Positive for wound (drainiage over gluteal cleft). Negative for color change and rash.  Psychiatric/Behavioral: Negative for agitation.     Physical Exam Updated Vital Signs BP 138/89 (BP Location: Right Arm)   Pulse 98   Temp 98.8 F (37.1 C) (Oral)   Resp 16   Ht 4\' 11"  (1.499 m)   Wt 66.7 kg (147 lb)   LMP 07/29/2017   SpO2 100%   BMI 29.69 kg/m   Physical Exam  Constitutional: She is oriented to person, place, and time. She appears well-developed and well-nourished. No distress.  HENT:  Head: Normocephalic and atraumatic.  Eyes: Right eye exhibits no discharge. Left eye exhibits no discharge.  Neck: Normal range of motion. Neck supple.  Pulmonary/Chest: Effort normal. No respiratory distress.  Abdominal: Soft. Bowel sounds are normal. There is no tenderness. There is no guarding.  Genitourinary:  Genitourinary Comments: No groin tenderness. No inguinal lymphadenopathy in the horizontal or vertical group. No rash, erythema, warmth.   Gluteal cleft with open sinus tract draining small drop of green purulence. No erythema, warmth or induration surrounding. No area of fluctuance.   Chaperone present for exam. No discharge. No CMT. No adnexal masses, tenderness, or fullness.  Scant amount of blood within vaginal vault.   Musculoskeletal:  Right axilla without adenopathy or mass. No erythema, induration or area  of fluctuance. No tenderness to palpation.   Neurological: She is alert and oriented to person, place, and time. Coordination normal.  Skin: Skin is warm and dry. Capillary refill takes less than 2 seconds. She is not diaphoretic.  Psychiatric: She has a normal mood and affect. Her behavior is normal.  Nursing note and vitals reviewed.    ED Treatments / Results  Labs (all labs ordered are listed, but only abnormal results are displayed) Labs Reviewed  WET PREP, GENITAL - Abnormal; Notable for the following components:      Result Value   Clue Cells Wet Prep HPF POC FEW (*)    WBC, Wet Prep HPF POC FEW (*)  All other components within normal limits  COMPREHENSIVE METABOLIC PANEL - Abnormal; Notable for the following components:   ALT 9 (*)    Alkaline Phosphatase 36 (*)    All other components within normal limits  URINALYSIS, ROUTINE W REFLEX MICROSCOPIC - Abnormal; Notable for the following components:   Color, Urine STRAW (*)    Hgb urine dipstick MODERATE (*)    Bacteria, UA RARE (*)    Squamous Epithelial / LPF 0-5 (*)    All other components within normal limits  CBC  PREGNANCY, URINE  RAPID HIV SCREEN (HIV 1/2 AB+AG)  RPR  GC/CHLAMYDIA PROBE AMP ([place]) NOT AT [facility]    EKG None  Radiology No results found.  Procedures .Marland KitchenIncision and Drainage Date/Time: 08/26/2017 3:19 PM Performed by: Glyn Ade, PA-C Authorized by: Glyn Ade, PA-C   Consent:    Consent obtained:  Verbal   Consent given by:  Patient   Risks discussed:  Bleeding, incomplete drainage, infection and pain   Alternatives discussed:  No treatment Location:    Type:  Pilonidal cyst   Size:  .5cm   Location:  Anogenital   Anogenital location:  Gluteal cleft Pre-procedure details:    Skin preparation:  Betadine Anesthesia (see MAR for exact dosages):    Anesthesia method:  Local infiltration   Local anesthetic:  Lidocaine 2% WITH epi Procedure type:    Complexity:   Simple Procedure details:    Incision types:  Single straight   Scalpel blade:  11   Drainage:  Purulent   Drainage amount:  Scant   Wound treatment:  Wound left open   Packing materials:  None Post-procedure details:    Patient tolerance of procedure:  Tolerated well, no immediate complications   (including critical care time)  EMERGENCY DEPARTMENT US SOFT TISSUE INTERPRETATION "Study: Limited Soft Tissue Ultrasound"  INDICATIONS: Pain Multiple views of the body part were obtained in real-time with a multi-frequency linear probe  PERFORMED BY: Myself IMAGES ARCHIVED?: Yes SIDE:Midline BODY PART:gluteal cleft INTERPRETATION:  small pilonidal abscess present  Medications Ordered in ED Medications  ibuprofen (ADVIL,MOTRIN) tablet 600 mg (600 mg Oral Given 08/26/17 1151)  lidocaine-EPINEPHrine (XYLOCAINE W/EPI) 2 %-1:200000 (PF) injection 10 mL (10 mLs Intradermal Given 08/26/17 1336)     Initial Impression / Assessment and Plan / ED Course  I have reviewed the triage vital signs and the nursing notes.  Pertinent labs & imaging results that were available during my care of the patient were reviewed by me and considered in my medical decision making (see chart for details).    Patient presents with multiple complaints  Pilonidal abscess Reports tenderness over gluteal cleft for the past week which slowly began draining yesterday.  On exam. she is afebrile and nontoxic-appearing.  Gluteal cleft reveals small sinus tract draining a drop of green purulence.  No surrounding erythema, warmth or induration.  No area of fluctuance can be palpated.  Bedside ultrasound reveals a small pocket of pus near the sinus tract.  Discussed with patient that warm compresses to encourage drainage would likely be sufficient given pocket is so small on ultrasound and already has drainage tract.  Patient requesting I&D.  I&D performed at the bedside which elicited a small amount of purulence.  No  surrounding cellulitis, patient will not be discharged with antibiotic.  She was given instructions to use warm soaks.  Counseled her on return precautions and patient agrees.  Groin pain Patient reports left-sided groin pain which occurs  intermittently for the past month now. She denies pain at this time.  Cannot elicit pain in the groin area on exam.  No inguinal lymphadenopathy.  No mass or evidence of inguinal hernia.  Patient has no associated abdominal tenderness.  Results reviewed.  CBC and CMP within normal limits.  UA without evidence of infection, there is a moderate amount of hemoglobin on the urine dipstick, although patient is spotting and suspect this is a contaminant.  Urine pregnancy negative.  Do not suspect intra-abdominal etiology.  Have counseled patient to follow-up with her primary doctor regarding this pain if it returns.  STD testing Patient requesting STD testing.  Reports that her female partner has had sex with men.  No complaint of vaginal discharge.  Wet prep reveals few WBCs and few clue cells.  No report of vaginal discharge to suggest bacterial vaginosis, we will not move forward in treating her given your few clue cells.  RPR nonreactive.  Rapid HIV screen negative.  Patient understands that she has GC/chlamydia testing which is pending and she will receive a phone call if it is positive.  She will need to be treated at the health department if it is positive and inform all sexual partners.  Axillary pain Patient states that she wants to be checked for abscess in her axillary region.  On palpation no mass, no axillary lymphadenopathy.  Patient counseled to follow-up with her primary care doctor if she should find a lump in the future and have regular breast screening exams.  Final Clinical Impressions(s) / ED Diagnoses   Final diagnoses:  Pilonidal abscess    ED Discharge Orders    None       Bernarda Caffey 08/27/17 0914    Hayden Rasmussen,  MD 08/28/17 [phone]

## 2017-08-26 NOTE — Discharge Instructions (Signed)
Your blood work was reassuring today.  You have chlamydia, gonorrhea and syphilis testing which is still pending.  If result comes back positive you will receive a phone call.  If positive, please inform all sexual partners and go to health department to be treated.  Please use gentle warm soaks to encourage drainage.  Return to the emergency department if you have any new or concerning symptoms including fever greater than 100.4 F, worsening pain or redness in your gluteus area.

## 2017-08-26 NOTE — ED Notes (Signed)
Declined W/C at D/C and was escorted to lobby by RN. 

## 2017-08-26 NOTE — ED Triage Notes (Signed)
Pt. Stated, I have places and its draining at the top of my butt, also in my left under arm, I have some pain and a little swollen in left groin area. For the last 2 weeks Ive had a headache.

## 2017-08-27 ENCOUNTER — Encounter: Payer: Self-pay | Admitting: Physician Assistant

## 2017-08-27 LAB — RPR: RPR: NONREACTIVE

## 2017-08-28 LAB — GC/CHLAMYDIA PROBE AMP (~~LOC~~) NOT AT ARMC
Chlamydia: NEGATIVE
Neisseria Gonorrhea: NEGATIVE

## 2017-08-28 NOTE — Telephone Encounter (Signed)
Please contact the patient to schedule an appointment. PCP is [facility]OO at this time.

## 2017-08-29 ENCOUNTER — Ambulatory Visit: Payer: Medicaid Other | Admitting: Physician Assistant

## 2017-08-30 ENCOUNTER — Encounter: Payer: Self-pay | Admitting: Family Medicine

## 2017-08-30 ENCOUNTER — Ambulatory Visit: Payer: Self-pay | Attending: Family Medicine | Admitting: Family Medicine

## 2017-08-30 VITALS — BP 129/83 | HR 76 | Temp 98.3°F | Ht 59.0 in | Wt 151.4 lb

## 2017-08-30 DIAGNOSIS — R102 Pelvic and perineal pain: Secondary | ICD-10-CM

## 2017-08-30 DIAGNOSIS — F419 Anxiety disorder, unspecified: Secondary | ICD-10-CM | POA: Insufficient documentation

## 2017-08-30 DIAGNOSIS — M5033 Other cervical disc degeneration, cervicothoracic region: Secondary | ICD-10-CM | POA: Insufficient documentation

## 2017-08-30 DIAGNOSIS — R590 Localized enlarged lymph nodes: Secondary | ICD-10-CM | POA: Insufficient documentation

## 2017-08-30 DIAGNOSIS — B9689 Other specified bacterial agents as the cause of diseases classified elsewhere: Secondary | ICD-10-CM

## 2017-08-30 DIAGNOSIS — N760 Acute vaginitis: Secondary | ICD-10-CM | POA: Insufficient documentation

## 2017-08-30 DIAGNOSIS — Z79899 Other long term (current) drug therapy: Secondary | ICD-10-CM | POA: Insufficient documentation

## 2017-08-30 DIAGNOSIS — J039 Acute tonsillitis, unspecified: Secondary | ICD-10-CM | POA: Insufficient documentation

## 2017-08-30 MED ORDER — IBUPROFEN 600 MG PO TABS: 600.0000 mg | ORAL_TABLET | Freq: Two times a day (BID) | ORAL | 1 refills | Status: DC | PRN

## 2017-08-30 MED ORDER — PREDNISONE 20 MG PO TABS: 20.0000 mg | ORAL_TABLET | Freq: Every day | ORAL | 0 refills | Status: DC

## 2017-08-30 MED ORDER — METRONIDAZOLE 0.75 % VA GEL: 1.0000 | Freq: Every day | VAGINAL | 0 refills | Status: DC

## 2017-08-30 NOTE — Patient Instructions (Signed)

## 2017-08-30 NOTE — ED Notes (Signed)
08/26/2017, Pt. Called and left a voicemail on this RN's phone.  Returned call and message left.

## 2017-08-30 NOTE — Progress Notes (Signed)
Patient neck swells on left side. Patient has a history of lymph nodes.

## 2017-08-30 NOTE — Progress Notes (Signed)
Subjective:  Patient ID: Natalie Thornton, female    DOB: 1986/04/23  Age: 32 y.o. MRN: [id number]  CC: Neck Pain   HPI JAYDAH STAHLE is a 32 year old female patient of Dr. Wynetta Emery with a history of degenerative disc disease who presents today for an acute visit complaining of 2-week history of lymphadenopathy in her groinsas she informs me this was told to her at her ED visit.  She endorses left groin pain which is moderate and has been intermittent. 1 week ago she had an incision and drainage of a pilonidal cyst and reports doing well. Denies vaginal discharge, dysuria. Labs performed in the ED revealed clue cells on wet prep, hematuria in UA. LMP was in 06/2017 and has been irregular coming every 3-4 months. She complains of neck pain worse on the left which feels like a lump in her throat whenever she swallows but denies fever, sinus tenderness, sore throat.  Past Medical History:  Diagnosis Date  . Anxiety   . Arthritis   . DDD (degenerative disc disease)   . Degenerative disc disease, cervical   . Disc disease, degenerative, cervical   . H pylori ulcer   . Pilonidal cyst     Past Surgical History:  Procedure Laterality Date  . COLONOSCOPY      No Known Allergies   Outpatient Medications Prior to Visit  Medication Sig Dispense Refill  . cyclobenzaprine (FLEXERIL) 10 MG tablet Take 1 tablet (10 mg total) by mouth 2 (two) times daily as needed for muscle spasms. (Patient not taking: Reported on 08/26/2017) 20 tablet 0  . medroxyPROGESTERone (PROVERA) 10 MG tablet 1 tab daily the first week of every two months (Patient not taking: Reported on 08/26/2017) 7 tablet 5  . sertraline (ZOLOFT) 50 MG tablet 1/2 tab daily x 2 wks then 1 tab daily (Patient not taking: Reported on 08/26/2017) 30 tablet 3   No facility-administered medications prior to visit.     ROS Review of Systems  Constitutional: Negative for activity change, appetite change and fatigue.  HENT: Negative for  congestion, sinus pressure and sore throat.   Eyes: Negative for visual disturbance.  Respiratory: Negative for cough, chest tightness, shortness of breath and wheezing.   Cardiovascular: Negative for chest pain and palpitations.  Gastrointestinal: Positive for abdominal pain. Negative for abdominal distention and constipation.  Endocrine: Negative for polydipsia.  Genitourinary: Negative for dysuria and frequency.  Musculoskeletal: Positive for neck pain. Negative for arthralgias and back pain.  Skin: Negative for rash.  Neurological: Negative for tremors, light-headedness and numbness.  Hematological: Does not bruise/bleed easily.  Psychiatric/Behavioral: Negative for agitation and behavioral problems.    Objective:  BP 129/83   Pulse 76   Temp 98.3 F (36.8 C) (Oral)   Ht 4\' 11"  (1.499 m)   Wt 151 lb 6.4 oz (68.7 kg)   SpO2 97%   BMI 30.58 kg/m   BP/Weight 08/30/2017 08/26/2017 0/27/7412  Systolic BP 878 676 720  Diastolic BP 83 84 86  Wt. (Lbs) 151.4 147 -  BMI 30.58 29.69 -      Physical Exam  Constitutional: She is oriented to person, place, and time. She appears well-developed and well-nourished. No distress.  HENT:  Head: Normocephalic.  Right Ear: External ear normal.  Left Ear: External ear normal.  Nose: Nose normal.  Mouth/Throat: Oropharynx is clear and moist.  Slightly enlarged tonsils with mild hyperemia  Eyes: Pupils are equal, round, and reactive to light. Conjunctivae and EOM are normal.  Neck: Normal range of motion. No JVD present.  Cardiovascular: Normal rate, regular rhythm, normal heart sounds and intact distal pulses. Exam reveals no gallop.  No murmur heard. Pulmonary/Chest: Effort normal and breath sounds normal. No respiratory distress. She has no wheezes. She has no rales. She exhibits no tenderness.  Abdominal: Soft. Bowel sounds are normal. She exhibits no distension and no mass. There is no tenderness.  L inguinal lymphadenopathy, not  tender <0.5cm   Musculoskeletal: Normal range of motion. She exhibits tenderness (TTP of left pelvic rim). She exhibits no edema.  Neurological: She is alert and oriented to person, place, and time. She has normal reflexes.  Skin: Skin is warm and dry. She is not diaphoretic.  Psychiatric: She has a normal mood and affect.     Assessment & Plan:   1. Tonsillitis No indication for antibiotics at this time - predniSONE (DELTASONE) 20 MG tablet; Take 1 tablet (20 mg total) by mouth daily with breakfast.  Dispense: 5 tablet; Refill: 0  2. Pelvic pain in female She does have pain in the left pelvic rim which could be from inflammation - ibuprofen (ADVIL,MOTRIN) 600 MG tablet; Take 1 tablet (600 mg total) by mouth every 12 (twelve) hours as needed.  Dispense: 60 tablet; Refill: 1  3. Bacterial vaginitis Clue cells present no up from the ED She also has some left inguinal lymphadenopathy 0.5 cm in diameter and she has been reassured regarding this - metroNIDAZOLE (METROGEL VAGINAL) 0.75 % vaginal gel; Place 1 Applicatorful vaginally at bedtime.  Dispense: 70 g; Refill: 0   Meds ordered this encounter  Medications  . predniSONE (DELTASONE) 20 MG tablet    Sig: Take 1 tablet (20 mg total) by mouth daily with breakfast.    Dispense:  5 tablet    Refill:  0  . metroNIDAZOLE (METROGEL VAGINAL) 0.75 % vaginal gel    Sig: Place 1 Applicatorful vaginally at bedtime.    Dispense:  70 g    Refill:  0  . ibuprofen (ADVIL,MOTRIN) 600 MG tablet    Sig: Take 1 tablet (600 mg total) by mouth every 12 (twelve) hours as needed.    Dispense:  60 tablet    Refill:  1    Follow-up: Return in about 1 month (around 09/30/2017) for Follow-up of chronic medical conditions with PCP-Dr. Wynetta Emery.   Charlott Rakes MD

## 2017-09-06 ENCOUNTER — Ambulatory Visit: Payer: Medicaid Other | Admitting: Family Medicine

## 2017-09-19 ENCOUNTER — Encounter: Payer: Self-pay | Admitting: Internal Medicine

## 2017-09-21 ENCOUNTER — Ambulatory Visit: Payer: Self-pay | Attending: Internal Medicine | Admitting: Physician Assistant

## 2017-09-21 VITALS — BP 106/73 | HR 81 | Temp 99.0°F | Resp 16 | Ht 59.0 in | Wt 148.8 lb

## 2017-09-21 DIAGNOSIS — M5030 Other cervical disc degeneration, unspecified cervical region: Secondary | ICD-10-CM | POA: Insufficient documentation

## 2017-09-21 DIAGNOSIS — G4700 Insomnia, unspecified: Secondary | ICD-10-CM | POA: Insufficient documentation

## 2017-09-21 DIAGNOSIS — M545 Low back pain: Secondary | ICD-10-CM | POA: Insufficient documentation

## 2017-09-21 DIAGNOSIS — G8929 Other chronic pain: Secondary | ICD-10-CM | POA: Insufficient documentation

## 2017-09-21 DIAGNOSIS — F419 Anxiety disorder, unspecified: Secondary | ICD-10-CM | POA: Insufficient documentation

## 2017-09-21 MED ORDER — NAPROXEN 500 MG PO TABS: 500.0000 mg | ORAL_TABLET | Freq: Two times a day (BID) | ORAL | 2 refills | Status: DC

## 2017-09-21 MED ORDER — TRAZODONE HCL 50 MG PO TABS: 25.0000 mg | ORAL_TABLET | Freq: Every evening | ORAL | 3 refills | Status: DC | PRN

## 2017-09-21 MED ORDER — METHOCARBAMOL 500 MG PO TABS: 500.0000 mg | ORAL_TABLET | Freq: Three times a day (TID) | ORAL | 0 refills | Status: DC

## 2017-09-21 NOTE — Progress Notes (Signed)
Patient ID: Natalie Thornton, female   DOB: Oct 11, 1985, 32 y.o.   MRN: [id number]   Natalie Thornton, is a 32 y.o. female  WGN:[id number]  HQI:[id number]  DOB - Oct 10, 1985  Subjective:  Chief Complaint and HPI: Natalie Thornton is a 32 y.o. female here today for continued issues with LBP.  Flexeril and ibuprofen provide minimal relief.  This is an ongoing issues.  No new s/sx.  Sometimes she does have shooting pains down both legs.  No weakness.  No problems moving bowels or bladder.  Can't afford to go to a specialist. She has no insurance.  Also, she is having trouble with sleep.  She is having trouble going to sleep and staying asleep.  Not taking zoloft.  She doesn't think she is depressed.  Denies SI/HI.   ROS:   Constitutional:  No f/c, No night sweats, No unexplained weight loss. EENT:  No vision changes, No blurry vision, No hearing changes. No mouth, throat, or ear problems.  Respiratory: No cough, No SOB Cardiac: No CP, no palpitations GI:  No abd pain, No N/V/D. GU: No Urinary s/sx Musculoskeletal: +back pain Neuro: No headache, no dizziness, no motor weakness.  Skin: No rash Endocrine:  No polydipsia. No polyuria.  Psych: Denies SI/HI  No problems updated.  ALLERGIES: No Known Allergies  PAST MEDICAL HISTORY: Past Medical History:  Diagnosis Date  . Anxiety   . Arthritis   . DDD (degenerative disc disease)   . Degenerative disc disease, cervical   . Disc disease, degenerative, cervical   . H pylori ulcer   . Pilonidal cyst     MEDICATIONS AT HOME: Prior to Admission medications   Medication Sig Start Date End Date Taking? Authorizing Provider  medroxyPROGESTERone (PROVERA) 10 MG tablet 1 tab daily the first week of every two months Patient not taking: Reported on 08/26/2017 06/26/17   Ladell Pier, MD  methocarbamol (ROBAXIN) 500 MG tablet Take 1 tablet (500 mg total) by mouth 3 (three) times daily. Prn muscle spasm 09/21/17   Argentina Donovan, PA-C    metroNIDAZOLE (METROGEL VAGINAL) 0.75 % vaginal gel Place 1 Applicatorful vaginally at bedtime. Patient not taking: Reported on 09/21/2017 08/30/17   Charlott Rakes, MD  naproxen (NAPROSYN) 500 MG tablet Take 1 tablet (500 mg total) by mouth 2 (two) times daily with a meal. 09/21/17   Argentina Donovan, PA-C  sertraline (ZOLOFT) 50 MG tablet 1/2 tab daily x 2 wks then 1 tab daily Patient not taking: Reported on 08/26/2017 06/26/17   Ladell Pier, MD  traZODone (DESYREL) 50 MG tablet Take 0.5-1 tablets (25-50 mg total) by mouth at bedtime as needed for sleep. 09/21/17   Argentina Donovan, PA-C     Objective:  EXAM:   Vitals:   09/21/17 1356  BP: 106/73  Pulse: 81  Resp: 16  Temp: 99 F (37.2 C)  TempSrc: Oral  SpO2: 99%  Weight: 148 lb 12.8 oz (67.5 kg)  Height: 4\' 11"  (1.499 m)    General appearance : A&OX3. NAD. Non-toxic-appearing HEENT: Atraumatic and Normocephalic.  PERRLA. EOM intact. Neck: supple, no JVD. No cervical lymphadenopathy. No thyromegaly Chest/Lungs:  Breathing-non-labored, Good air entry bilaterally, breath sounds normal without rales, rhonchi, or wheezing  CVS: S1 S2 regular, no murmurs, gallops, rubs  Extremities: Bilateral Lower Ext shows no edema, both legs are warm to touch with = pulse throughout Back with 90% ROM.  + paraspinus spasm in B lower back.  Neg SLR.  DTR=B Neurology:  CN II-XII grossly intact, Non focal.   Psych:  TP linear. J/I WNL. Normal speech. Appropriate eye contact and flat affect. Mood seems depressed Skin:  No Rash  Data Review No results found for: HGBA1C   Assessment & Plan   1. Chronic low back pain, unspecified back pain laterality, with sciatica presence unspecified No changes or red flags - methocarbamol (ROBAXIN) 500 MG tablet; Take 1 tablet (500 mg total) by mouth 3 (three) times daily. Prn muscle spasm  Dispense: 90 tablet; Refill: 0 - naproxen (NAPROSYN) 500 MG tablet; Take 1 tablet (500 mg total) by mouth 2 (two)  times daily with a meal.  Dispense: 60 tablet; Refill: 2 Check vitamin D level  2. Insomnia, unspecified type - traZODone (DESYREL) 50 MG tablet; Take 0.5-1 tablets (25-50 mg total) by mouth at bedtime as needed for sleep.  Dispense: 30 tablet; Refill: 3  Patient have been counseled extensively about nutrition and exercise  Return in about 1 month (around 10/21/2017) for Dr Wynetta Emery for sleep and back pain.  The patient was given clear instructions to go to ER or return to medical center if symptoms don't improve, worsen or new problems develop. The patient verbalized understanding. The patient was told to call to get lab results if they haven't heard anything in the next week.     Freeman Caldron, PA-C [facility] [place], [place]   09/21/2017, 2:13 PM

## 2017-09-21 NOTE — Progress Notes (Signed)
Pt is unable to sleep due to back pain and chest pain.

## 2017-09-22 LAB — VITAMIN D 25 HYDROXY (VIT D DEFICIENCY, FRACTURES): VIT D 25 HYDROXY: 15.6 ng/mL — AB (ref 30.0–100.0)

## 2017-09-24 ENCOUNTER — Other Ambulatory Visit: Payer: Self-pay | Admitting: Physician Assistant

## 2017-09-24 MED ORDER — VITAMIN D (ERGOCALCIFEROL) 1.25 MG (50000 UNIT) PO CAPS: 50000.0000 [IU] | ORAL_CAPSULE | ORAL | 0 refills | Status: DC

## 2017-09-25 ENCOUNTER — Encounter: Payer: Self-pay | Admitting: Physician Assistant

## 2017-09-26 ENCOUNTER — Telehealth: Payer: Self-pay

## 2017-09-26 NOTE — Telephone Encounter (Signed)
-----   Message from Argentina Donovan, [place] sent at 09/24/2017  2:51 PM EDT ----- Your vitamin D is low.  This can contribute to muscle aches, anxiety, fatigue, and depression.  I have sent a prescription to the pharmacy for you to take once a week.  We will recheck this level in 3-4 months.  Thanks, Freeman Caldron, PA-C

## 2017-09-26 NOTE — Telephone Encounter (Signed)
CMA attempt to call patient to inform lab results. No answer and left a VM for patient to call back.   If patient call back, please inform:  Your vitamin D is low.  This can contribute to muscle aches, anxiety, fatigue, and depression.  I have sent a prescription to the pharmacy for you to take once a week.  We will recheck this level in 3-4 months.  Thanks, Freeman Caldron, PA-C

## 2017-10-11 ENCOUNTER — Emergency Department (HOSPITAL_COMMUNITY)
Admission: EM | Admit: 2017-10-11 | Discharge: 2017-10-12 | Disposition: A | Discharge status: Home / Self Care | Payer: Medicaid Other | Attending: Emergency Medicine | Admitting: Emergency Medicine

## 2017-10-11 ENCOUNTER — Other Ambulatory Visit: Payer: Self-pay

## 2017-10-11 ENCOUNTER — Emergency Department (HOSPITAL_COMMUNITY): Payer: Medicaid Other

## 2017-10-11 ENCOUNTER — Encounter (HOSPITAL_COMMUNITY): Payer: Self-pay | Admitting: Emergency Medicine

## 2017-10-11 DIAGNOSIS — Z5321 Procedure and treatment not carried out due to patient leaving prior to being seen by health care provider: Secondary | ICD-10-CM | POA: Insufficient documentation

## 2017-10-11 DIAGNOSIS — R079 Chest pain, unspecified: Secondary | ICD-10-CM | POA: Insufficient documentation

## 2017-10-11 LAB — BASIC METABOLIC PANEL
ANION GAP: 6 (ref 5–15)
BUN: 11 mg/dL (ref 6–20)
CHLORIDE: 101 mmol/L (ref 101–111)
CO2: 27 mmol/L (ref 22–32)
Calcium: 9.2 mg/dL (ref 8.9–10.3)
Creatinine, Ser: 0.83 mg/dL (ref 0.44–1.00)
GFR calc Af Amer: >60 mL/min (ref >60)
GFR calc non Af Amer: >60 mL/min (ref >60)
GLUCOSE: 91 mg/dL (ref 65–99)
POTASSIUM: 4 mmol/L (ref 3.5–5.1)
Sodium: 134 mmol/L — ABNORMAL LOW (ref 135–145)

## 2017-10-11 LAB — CBC
HEMATOCRIT: 40.7 % (ref 36.0–46.0)
HEMOGLOBIN: 13.5 g/dL (ref 12.0–15.0)
MCH: 31 pg (ref 26.0–34.0)
MCHC: 33.2 g/dL (ref 30.0–36.0)
MCV: 93.3 fL (ref 78.0–100.0)
Platelets: 295 10*3/uL (ref 150–400)
RBC: 4.36 MIL/uL (ref 3.87–5.11)
RDW: 12.6 % (ref 11.5–15.5)
WBC: 5.8 10*3/uL (ref 4.0–10.5)

## 2017-10-11 LAB — I-STAT TROPONIN, ED: Troponin i, poc: 0 ng/mL (ref 0.00–0.08)

## 2017-10-11 LAB — I-STAT BETA HCG BLOOD, ED (MC, WL, AP ONLY): I-stat hCG, quantitative: <5 m[IU]/mL (ref <5)

## 2017-10-11 NOTE — ED Notes (Signed)
No reply for vitals x3. 

## 2017-10-11 NOTE — ED Triage Notes (Signed)
Pt reports R side non-radiating chest pain starting this morning. Pt reports feeling dizziness. Pt denies nausea/vomiting/SHOB. Pt denies cardiac history.

## 2017-10-12 NOTE — ED Notes (Signed)
No reply x3 for vitals 

## 2017-10-28 ENCOUNTER — Emergency Department (HOSPITAL_COMMUNITY)
Admission: EM | Admit: 2017-10-28 | Discharge: 2017-10-29 | Disposition: A | Discharge status: Home / Self Care | Payer: Medicaid Other | Attending: Emergency Medicine | Admitting: Emergency Medicine

## 2017-10-28 ENCOUNTER — Other Ambulatory Visit: Payer: Self-pay

## 2017-10-28 DIAGNOSIS — Z5321 Procedure and treatment not carried out due to patient leaving prior to being seen by health care provider: Secondary | ICD-10-CM | POA: Insufficient documentation

## 2017-10-28 DIAGNOSIS — R51 Headache: Secondary | ICD-10-CM | POA: Insufficient documentation

## 2017-10-28 LAB — URINALYSIS, ROUTINE W REFLEX MICROSCOPIC
Bilirubin Urine: NEGATIVE
Glucose, UA: NEGATIVE mg/dL
HGB URINE DIPSTICK: NEGATIVE
Ketones, ur: NEGATIVE mg/dL
Leukocytes, UA: NEGATIVE
NITRITE: NEGATIVE
Protein, ur: NEGATIVE mg/dL
SPECIFIC GRAVITY, URINE: 1.013 (ref 1.005–1.030)
pH: 7 (ref 5.0–8.0)

## 2017-10-28 LAB — COMPREHENSIVE METABOLIC PANEL
ALK PHOS: 34 U/L — AB (ref 38–126)
ALT: 14 U/L (ref 14–54)
ANION GAP: 8 (ref 5–15)
AST: 21 U/L (ref 15–41)
Albumin: 3.7 g/dL (ref 3.5–5.0)
BUN: 6 mg/dL (ref 6–20)
CALCIUM: 8.9 mg/dL (ref 8.9–10.3)
CHLORIDE: 103 mmol/L (ref 101–111)
CO2: 26 mmol/L (ref 22–32)
Creatinine, Ser: 0.91 mg/dL (ref 0.44–1.00)
GFR calc Af Amer: >60 mL/min (ref >60)
GFR calc non Af Amer: >60 mL/min (ref >60)
Glucose, Bld: 94 mg/dL (ref 65–99)
Potassium: 4.1 mmol/L (ref 3.5–5.1)
SODIUM: 137 mmol/L (ref 135–145)
Total Bilirubin: 0.5 mg/dL (ref 0.3–1.2)
Total Protein: 6.6 g/dL (ref 6.5–8.1)

## 2017-10-28 LAB — CBC
HCT: 41 % (ref 36.0–46.0)
Hemoglobin: 13.5 g/dL (ref 12.0–15.0)
MCH: 30.1 pg (ref 26.0–34.0)
MCHC: 32.9 g/dL (ref 30.0–36.0)
MCV: 91.3 fL (ref 78.0–100.0)
Platelets: 252 10*3/uL (ref 150–400)
RBC: 4.49 MIL/uL (ref 3.87–5.11)
RDW: 11.9 % (ref 11.5–15.5)
WBC: 6.5 10*3/uL (ref 4.0–10.5)

## 2017-10-28 LAB — I-STAT BETA HCG BLOOD, ED (MC, WL, AP ONLY)

## 2017-10-28 LAB — LIPASE, BLOOD: LIPASE: 34 U/L (ref 11–51)

## 2017-10-28 MED ORDER — ONDANSETRON 4 MG PO TBDP
4.0000 mg | ORAL_TABLET | Freq: Once | ORAL | Status: AC | PRN
  Administered 2017-10-28: 4 mg via ORAL
  Filled 2017-10-28: qty 1

## 2017-10-28 NOTE — ED Triage Notes (Signed)
Patient states that she was at [employer], when she began having a HA and vomited. Went home and HA persisted as well as nausea.

## 2017-10-29 ENCOUNTER — Encounter: Payer: Self-pay | Admitting: Internal Medicine

## 2017-10-29 NOTE — ED Notes (Signed)
Writer call for reassess vitals, no response.  

## 2017-10-31 NOTE — ED Notes (Addendum)
Follow up call made  Will call here pcp today for appointment  10/31/17  1013  s Shineka Auble rn

## 2017-11-17 ENCOUNTER — Inpatient Hospital Stay (HOSPITAL_COMMUNITY)
Admission: AD | Admit: 2017-11-17 | Discharge: 2017-11-17 | Discharge status: Against Medical Advice | Payer: Medicaid Other | Source: Ambulatory Visit | Attending: Obstetrics and Gynecology | Admitting: Obstetrics and Gynecology

## 2017-11-17 ENCOUNTER — Other Ambulatory Visit: Payer: Self-pay

## 2017-11-17 DIAGNOSIS — Z5321 Procedure and treatment not carried out due to patient leaving prior to being seen by health care provider: Secondary | ICD-10-CM | POA: Insufficient documentation

## 2017-11-17 LAB — URINALYSIS, ROUTINE W REFLEX MICROSCOPIC
Bilirubin Urine: NEGATIVE
GLUCOSE, UA: NEGATIVE mg/dL
KETONES UR: NEGATIVE mg/dL
LEUKOCYTES UA: NEGATIVE
Nitrite: NEGATIVE
Protein, ur: NEGATIVE mg/dL
Specific Gravity, Urine: 1.011 (ref 1.005–1.030)
pH: 6 (ref 5.0–8.0)

## 2017-11-17 LAB — POCT PREGNANCY, URINE: Preg Test, Ur: NEGATIVE

## 2017-11-17 NOTE — MAU Note (Signed)
Not in lobby x2.

## 2017-11-17 NOTE — MAU Note (Signed)
Not in lobby x1  

## 2017-11-17 NOTE — MAU Note (Signed)
Pt presents with c/o right nipple pain that began 4 days ago.  Denies any discharge.  LMP 10/23/2017, states not pregnant.

## 2017-11-17 NOTE — MAU Note (Signed)
Not in lobby x 3 assume pt eloped

## 2017-12-12 ENCOUNTER — Encounter (HOSPITAL_COMMUNITY): Payer: Self-pay | Admitting: Emergency Medicine

## 2017-12-12 ENCOUNTER — Other Ambulatory Visit: Payer: Self-pay

## 2017-12-12 ENCOUNTER — Emergency Department (HOSPITAL_COMMUNITY)
Admission: EM | Admit: 2017-12-12 | Discharge: 2017-12-12 | Disposition: A | Discharge status: Home / Self Care | Payer: Self-pay | Attending: Emergency Medicine | Admitting: Emergency Medicine

## 2017-12-12 DIAGNOSIS — E119 Type 2 diabetes mellitus without complications: Secondary | ICD-10-CM | POA: Insufficient documentation

## 2017-12-12 DIAGNOSIS — J069 Acute upper respiratory infection, unspecified: Secondary | ICD-10-CM | POA: Insufficient documentation

## 2017-12-12 DIAGNOSIS — B9789 Other viral agents as the cause of diseases classified elsewhere: Secondary | ICD-10-CM

## 2017-12-12 DIAGNOSIS — Z79899 Other long term (current) drug therapy: Secondary | ICD-10-CM | POA: Insufficient documentation

## 2017-12-12 DIAGNOSIS — Z87891 Personal history of nicotine dependence: Secondary | ICD-10-CM | POA: Insufficient documentation

## 2017-12-12 DIAGNOSIS — J029 Acute pharyngitis, unspecified: Secondary | ICD-10-CM | POA: Insufficient documentation

## 2017-12-12 LAB — GROUP A STREP BY PCR: GROUP A STREP BY PCR: NOT DETECTED

## 2017-12-12 MED ORDER — PREDNISONE 50 MG PO TABS: 50.0000 mg | ORAL_TABLET | Freq: Every day | ORAL | 0 refills | Status: DC

## 2017-12-12 MED ORDER — GUAIFENESIN ER 1200 MG PO TB12: 1.0000 | ORAL_TABLET | Freq: Two times a day (BID) | ORAL | 0 refills | Status: DC

## 2017-12-12 MED ORDER — IBUPROFEN 800 MG PO TABS
800.0000 mg | ORAL_TABLET | Freq: Once | ORAL | Status: AC
  Administered 2017-12-12: 800 mg via ORAL
  Filled 2017-12-12: qty 1

## 2017-12-12 MED ORDER — ACETAMINOPHEN-CODEINE 120-12 MG/5ML PO SOLN: 10.0000 mL | ORAL | 0 refills | Status: DC | PRN

## 2017-12-12 NOTE — ED Provider Notes (Signed)
[facility] EMERGENCY DEPARTMENT Provider Note   CSN: [id number] Arrival date & time: 12/12/17  1659     History   Chief Complaint Chief Complaint  Patient presents with  . Sore Throat    HPI Natalie Thornton is a 32 y.o. female.  HPI Patient presents to the emergency department with a 2-week history of cough runny nose sore throat.  The patient states that she has been taking over-the-counter medications with minimal relief of her symptoms.  Patient states that nothing seems to make the condition better or worse.  Patient states that she does not have any ear pain.  Patient denies any fevers, nausea, vomiting, weakness, dizziness, headache, shortness of breath, chest pain or syncope. Past Medical History:  Diagnosis Date  . Anxiety   . Arthritis   . DDD (degenerative disc disease)   . Degenerative disc disease, cervical   . Disc disease, degenerative, cervical   . H pylori ulcer   . Pilonidal cyst     Patient Active Problem List   Diagnosis Date Noted  . Diabetes mellitus screening 01/06/2017  . Tobacco abuse 01/06/2017  . Anxiety and depression 01/06/2017  . Dyspnea on exertion 10/20/2016  . Cigarette smoker 10/20/2016  . Solitary pulmonary nodule 10/20/2016  . Acute contact dermatitis 10/20/2016  . Pilonidal sinus 07/05/2011    Past Surgical History:  Procedure Laterality Date  . COLONOSCOPY       OB History   None      Home Medications    Prior to Admission medications   Medication Sig Start Date End Date Taking? Authorizing Provider  medroxyPROGESTERone (PROVERA) 10 MG tablet 1 tab daily the first week of every two months Patient not taking: Reported on 08/26/2017 06/26/17   Ladell Pier, MD  methocarbamol (ROBAXIN) 500 MG tablet Take 1 tablet (500 mg total) by mouth 3 (three) times daily. Prn muscle spasm 09/21/17   Argentina Donovan, PA-C  metroNIDAZOLE (METROGEL VAGINAL) 0.75 % vaginal gel Place 1 Applicatorful vaginally at  bedtime. Patient not taking: Reported on 09/21/2017 08/30/17   Charlott Rakes, MD  naproxen (NAPROSYN) 500 MG tablet Take 1 tablet (500 mg total) by mouth 2 (two) times daily with a meal. 09/21/17   Argentina Donovan, PA-C  sertraline (ZOLOFT) 50 MG tablet 1/2 tab daily x 2 wks then 1 tab daily Patient not taking: Reported on 08/26/2017 06/26/17   Ladell Pier, MD  traZODone (DESYREL) 50 MG tablet Take 0.5-1 tablets (25-50 mg total) by mouth at bedtime as needed for sleep. 09/21/17   Argentina Donovan, PA-C  Vitamin D, Ergocalciferol, (DRISDOL) 50000 units CAPS capsule Take 1 capsule (50,000 Units total) by mouth every 7 (seven) days. 09/24/17   Argentina Donovan, PA-C    Family History Family History  Problem Relation Age of Onset  . AAA (abdominal aortic aneurysm) Maternal Grandmother   . Leukemia Maternal Grandmother   . Breast cancer Maternal Grandmother   . Head & neck cancer Paternal Grandmother        Back cancer  . Lymphoma Paternal Grandmother   . Cirrhosis Maternal Uncle   . Breast cancer Other   . Colon cancer Neg Hx   . Esophageal cancer Neg Hx   . Rectal cancer Neg Hx   . Stomach cancer Neg Hx     Social History Social History   Tobacco Use  . Smoking status: Former Smoker    Packs/day: 5.00    Years: 10.00  Pack years: 50.00    Types: Cigars  . Smokeless tobacco: Never Used  . Tobacco comment: tobacco info given 03/14/17  Substance Use Topics  . Alcohol use: Not Currently    Comment: occasionally  . Drug use: No     Allergies   Patient has no known allergies.   Review of Systems Review of Systems All other systems negative except as documented in the HPI. All pertinent positives and negatives as reviewed in the HPI.  Physical Exam Updated Vital Signs BP 130/85 (BP Location: Right Arm)   Pulse 91   Temp 99.5 F (37.5 C) (Oral)   Resp 16   Ht 4\' 11"  (1.499 m)   Wt 65.8 kg (145 lb)   LMP 12/04/2017   SpO2 100%   BMI 29.29 kg/m   Physical  Exam  Constitutional: She is oriented to person, place, and time. She appears well-developed and well-nourished. No distress.  HENT:  Head: Normocephalic and atraumatic.  Mouth/Throat: Uvula is midline and mucous membranes are normal. No oral lesions. No uvula swelling. Posterior oropharyngeal edema and posterior oropharyngeal erythema present. No oropharyngeal exudate or tonsillar abscesses. Tonsils are 2+ on the right. Tonsils are 2+ on the left. No tonsillar exudate.  Eyes: Pupils are equal, round, and reactive to light.  Neck: Normal range of motion. Neck supple.  Cardiovascular: Normal rate, regular rhythm and normal heart sounds. Exam reveals no friction rub.  No murmur heard. Pulmonary/Chest: Effort normal and breath sounds normal. No respiratory distress. She has no wheezes.  Neurological: She is alert and oriented to person, place, and time. She exhibits normal muscle tone. Coordination normal.  Skin: Skin is warm and dry. Capillary refill takes less than 2 seconds. No rash noted. No erythema.  Psychiatric: She has a normal mood and affect. Her behavior is normal.  Nursing note and vitals reviewed.    ED Treatments / Results  Labs (all labs ordered are listed, but only abnormal results are displayed) Labs Reviewed  GROUP A STREP BY PCR    EKG None  Radiology No results found.  Procedures Procedures (including critical care time)  Medications Ordered in ED Medications - No data to display   Initial Impression / Assessment and Plan / ED Course  I have reviewed the triage vital signs and the nursing notes.  Pertinent labs & imaging results that were available during my care of the patient were reviewed by me and considered in my medical decision making (see chart for details).    Patient be treated for viral URI with cough.  Told to return here as needed.  Told to increase her fluid intake and rest as much as possible.  Patient agrees with plan and all questions were  answered.  Final Clinical Impressions(s) / ED Diagnoses   Final diagnoses:  None    ED Discharge Orders    None       Dalia Heading, PA-C 12/12/17 [place], Wenda Overland, MD 12/13/17 1247

## 2017-12-12 NOTE — ED Triage Notes (Signed)
Pt reports sore and swollen throat x 2 weeks. Pt reports pain with swallowing. Pt reports dry cough and runny nose.

## 2017-12-12 NOTE — Discharge Instructions (Signed)
Increase your fluid intake and rest as much as possible.  Return here for any worsening in your condition.

## 2017-12-12 NOTE — ED Provider Notes (Signed)
Patient placed in Quick Look pathway, seen and evaluated   Chief Complaint: sore throat  HPI:   Sore throat and neck pain x 2 weeks  ROS: no fever, no SOB (one)  Physical Exam:   Gen: No distress  Neuro: Awake and Alert  Skin: Warm    Focused Exam: throat: uvula midline, no tonsillar enlargement or exudates, no trismus   Initiation of care has begun. The patient has been counseled on the process, plan, and necessity for staying for the completion/evaluation, and the remainder of the medical screening examination    Domenic Moras, Hershal Coria 12/12/17 1714    Noemi Chapel, MD 12/15/17 [phone]

## 2017-12-19 ENCOUNTER — Emergency Department (HOSPITAL_COMMUNITY)
Admission: EM | Admit: 2017-12-19 | Discharge: 2017-12-20 | Disposition: A | Discharge status: Home / Self Care | Payer: Medicaid Other | Source: Home / Self Care | Attending: Emergency Medicine | Admitting: Emergency Medicine

## 2017-12-19 ENCOUNTER — Emergency Department (HOSPITAL_COMMUNITY)
Admission: EM | Admit: 2017-12-19 | Discharge: 2017-12-19 | Disposition: A | Discharge status: Home / Self Care | Payer: Medicaid Other | Attending: Emergency Medicine | Admitting: Emergency Medicine

## 2017-12-19 ENCOUNTER — Emergency Department (HOSPITAL_COMMUNITY): Payer: Medicaid Other

## 2017-12-19 ENCOUNTER — Encounter (HOSPITAL_COMMUNITY): Payer: Self-pay

## 2017-12-19 ENCOUNTER — Encounter (HOSPITAL_COMMUNITY): Payer: Self-pay | Admitting: Emergency Medicine

## 2017-12-19 DIAGNOSIS — R059 Cough, unspecified: Secondary | ICD-10-CM

## 2017-12-19 DIAGNOSIS — Z79899 Other long term (current) drug therapy: Secondary | ICD-10-CM | POA: Insufficient documentation

## 2017-12-19 DIAGNOSIS — Z87891 Personal history of nicotine dependence: Secondary | ICD-10-CM | POA: Insufficient documentation

## 2017-12-19 DIAGNOSIS — R599 Enlarged lymph nodes, unspecified: Secondary | ICD-10-CM | POA: Insufficient documentation

## 2017-12-19 DIAGNOSIS — R05 Cough: Secondary | ICD-10-CM

## 2017-12-19 LAB — BASIC METABOLIC PANEL
Anion gap: 7 (ref 5–15)
BUN: 7 mg/dL (ref 6–20)
CO2: 26 mmol/L (ref 22–32)
Calcium: 9.5 mg/dL (ref 8.9–10.3)
Chloride: 105 mmol/L (ref 98–111)
Creatinine, Ser: 0.9 mg/dL (ref 0.44–1.00)
GFR calc Af Amer: >60 mL/min (ref >60)
GFR calc non Af Amer: >60 mL/min (ref >60)
Glucose, Bld: 95 mg/dL (ref 70–99)
Potassium: 3.8 mmol/L (ref 3.5–5.1)
Sodium: 138 mmol/L (ref 135–145)

## 2017-12-19 LAB — CBC WITH DIFFERENTIAL/PLATELET
Abs Immature Granulocytes: 0 10*3/uL (ref 0.0–0.1)
Basophils Absolute: 0 10*3/uL (ref 0.0–0.1)
Basophils Relative: 1 %
Eosinophils Absolute: 0.1 10*3/uL (ref 0.0–0.7)
Eosinophils Relative: 2 %
HCT: 41.8 % (ref 36.0–46.0)
Hemoglobin: 13.5 g/dL (ref 12.0–15.0)
Immature Granulocytes: 0 %
Lymphocytes Relative: 38 %
Lymphs Abs: 1.5 10*3/uL (ref 0.7–4.0)
MCH: 29.9 pg (ref 26.0–34.0)
MCHC: 32.3 g/dL (ref 30.0–36.0)
MCV: 92.5 fL (ref 78.0–100.0)
Monocytes Absolute: 0.4 10*3/uL (ref 0.1–1.0)
Monocytes Relative: 10 %
Neutro Abs: 2 10*3/uL (ref 1.7–7.7)
Neutrophils Relative %: 49 %
Platelets: 281 10*3/uL (ref 150–400)
RBC: 4.52 MIL/uL (ref 3.87–5.11)
RDW: 12 % (ref 11.5–15.5)
WBC: 4.1 10*3/uL (ref 4.0–10.5)

## 2017-12-19 LAB — I-STAT TROPONIN, ED: Troponin i, poc: 0 ng/mL (ref 0.00–0.08)

## 2017-12-19 LAB — PREGNANCY, URINE: Preg Test, Ur: NEGATIVE

## 2017-12-19 NOTE — ED Triage Notes (Signed)
Pt checked in @ 4:43p, Armstead Peaks PA quick screened pt, labs drawn, chest xray done.  Pt had to leave to go to work but had to leave work d/t symptoms.  Onset 1 month swelling left sided neck pain and swollen lymph nodes, productive cough.  Denies sore throat.

## 2017-12-19 NOTE — ED Triage Notes (Signed)
Pt states she is here for left sided neck pain and swollen lymph nodes, also had swollen lymph nodes to the groin. Pt also has had a productive cough. Denies vaginal symptoms. Seen for same on 7/9. Denies sore throat. 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

## 2017-12-19 NOTE — ED Notes (Signed)
Pt ambulated with steady to the bathroom.

## 2017-12-19 NOTE — ED Provider Notes (Cosign Needed)
Patient placed in Quick Look pathway, seen and evaluated   Chief Complaint: Neck Pain  HPI:   Patient presents with a 1 week history of L sided neck pain, intermittent chest pain, shortness of breath, and dry cough. Her chest pain is sharp. It is not associated with coughing or breathing. Her shortness of breath is worse when working in [employer] she works in. Patient also reports intermittent inguinal lymph node swelling, but none at this time. She denies any abnormal vaginal bleeding or discharge.   ROS: +Neck pain, chest pain, shortness of breath, cough  Physical Exam:   Gen: No distress  Neuro: Awake and Alert  Skin: Warm    Focused Exam: Tenderness to the L side of neck, small palpable, mobile lymph node, lungs CTA, heart normal RR   Initiation of care has begun. The patient has been counseled on the process, plan, and necessity for staying for the completion/evaluation, and the remainder of the medical screening examination    Frederica Kuster, PA-C 12/19/17 1721

## 2017-12-20 LAB — RAPID HIV SCREEN (HIV 1/2 AB+AG)
HIV 1/2 ANTIBODIES: NONREACTIVE
HIV-1 P24 ANTIGEN - HIV24: NONREACTIVE

## 2017-12-20 MED ORDER — IBUPROFEN 800 MG PO TABS: 800.0000 mg | ORAL_TABLET | Freq: Three times a day (TID) | ORAL | 0 refills | Status: DC

## 2017-12-20 MED ORDER — BENZONATATE 100 MG PO CAPS
100.0000 mg | ORAL_CAPSULE | Freq: Once | ORAL | Status: AC
  Administered 2017-12-20: 100 mg via ORAL
  Filled 2017-12-20: qty 1

## 2017-12-20 MED ORDER — IBUPROFEN 800 MG PO TABS
800.0000 mg | ORAL_TABLET | Freq: Once | ORAL | Status: AC
  Administered 2017-12-20: 800 mg via ORAL
  Filled 2017-12-20: qty 1

## 2017-12-20 MED ORDER — BENZONATATE 100 MG PO CAPS: 100.0000 mg | ORAL_CAPSULE | Freq: Three times a day (TID) | ORAL | 0 refills | Status: DC

## 2017-12-20 MED ORDER — ALBUTEROL SULFATE HFA 108 (90 BASE) MCG/ACT IN AERS
2.0000 | INHALATION_SPRAY | Freq: Once | RESPIRATORY_TRACT | Status: AC
  Administered 2017-12-20: 2 via RESPIRATORY_TRACT
  Filled 2017-12-20: qty 6.7

## 2017-12-20 NOTE — ED Provider Notes (Signed)
[facility] EMERGENCY DEPARTMENT Provider Note   CSN: [id number] Arrival date & time: 12/19/17  2110     History   Chief Complaint Chief Complaint  Patient presents with  . Cough  . Neck Pain    HPI Natalie Thornton is a 32 y.o. female.  The history is provided by the patient and medical records.  Cough  Associated symptoms include chest pain and shortness of breath.  Neck Pain   Associated symptoms include chest pain.     32 year old female with history of anxiety, arthritis, degenerative disc disease, presenting to the ED with multiple concerns.  Reports she has had some left-sided neck pain, chest pain, cough, and some intermittent shortness of breath for about a week now.  States she was seen in the ED about 10 days ago and diagnosed with a viral upper respiratory infection.  States she does not really feel any better since that time.  States cough is intermittently productive, but mostly dry.  Reports some occasional shortness of breath and sensation of wheezing, worse when lying flat.  This also seems to exacerbate her coughing.  She denies any fevers or chills.  States she has noticed a "knot" on the left side of her neck.  States she is not having any difficulty swallowing, sensation of sore throat, or sensation of things getting lodged in her throat.  She has not felt the "knot" getting any bigger or smaller.  That she did notice one in the right side of her groin but that has since gone away.  She has not had any abdominal pain, pelvic pain, vaginal discharge, urinary symptoms.  Past Medical History:  Diagnosis Date  . Anxiety   . Arthritis   . DDD (degenerative disc disease)   . Degenerative disc disease, cervical   . Disc disease, degenerative, cervical   . H pylori ulcer   . Pilonidal cyst     Patient Active Problem List   Diagnosis Date Noted  . Diabetes mellitus screening 01/06/2017  . Tobacco abuse 01/06/2017  . Anxiety and depression  01/06/2017  . Dyspnea on exertion 10/20/2016  . Cigarette smoker 10/20/2016  . Solitary pulmonary nodule 10/20/2016  . Acute contact dermatitis 10/20/2016  . Pilonidal sinus 07/05/2011    Past Surgical History:  Procedure Laterality Date  . COLONOSCOPY       OB History   None      Home Medications    Prior to Admission medications   Medication Sig Start Date End Date Taking? Authorizing Provider  acetaminophen-codeine 120-12 MG/5ML solution Take 10 mLs by mouth every 4 (four) hours as needed for moderate pain. Patient not taking: Reported on 12/20/2017 12/12/17   Dalia Heading, PA-C  Guaifenesin 1200 MG TB12 Take 1 tablet (1,200 mg total) by mouth 2 (two) times daily. Patient not taking: Reported on 12/20/2017 12/12/17   Dalia Heading, PA-C  medroxyPROGESTERone (PROVERA) 10 MG tablet 1 tab daily the first week of every two months Patient not taking: Reported on 08/26/2017 06/26/17   Ladell Pier, MD  methocarbamol (ROBAXIN) 500 MG tablet Take 1 tablet (500 mg total) by mouth 3 (three) times daily. Prn muscle spasm Patient not taking: Reported on 12/20/2017 09/21/17   Argentina Donovan, PA-C  metroNIDAZOLE (METROGEL VAGINAL) 0.75 % vaginal gel Place 1 Applicatorful vaginally at bedtime. Patient not taking: Reported on 09/21/2017 08/30/17   Charlott Rakes, MD  naproxen (NAPROSYN) 500 MG tablet Take 1 tablet (500 mg total) by mouth 2 (  two) times daily with a meal. Patient not taking: Reported on 12/20/2017 09/21/17   Argentina Donovan, PA-C  predniSONE (DELTASONE) 50 MG tablet Take 1 tablet (50 mg total) by mouth daily with breakfast. Patient not taking: Reported on 12/20/2017 12/12/17   Dalia Heading, PA-C  sertraline (ZOLOFT) 50 MG tablet 1/2 tab daily x 2 wks then 1 tab daily Patient not taking: Reported on 08/26/2017 06/26/17   Ladell Pier, MD  traZODone (DESYREL) 50 MG tablet Take 0.5-1 tablets (25-50 mg total) by mouth at bedtime as needed for sleep. Patient  not taking: Reported on 12/20/2017 09/21/17   Argentina Donovan, PA-C  Vitamin D, Ergocalciferol, (DRISDOL) 50000 units CAPS capsule Take 1 capsule (50,000 Units total) by mouth every 7 (seven) days. Patient not taking: Reported on 12/20/2017 09/24/17   Argentina Donovan, PA-C    Family History Family History  Problem Relation Age of Onset  . AAA (abdominal aortic aneurysm) Maternal Grandmother   . Leukemia Maternal Grandmother   . Breast cancer Maternal Grandmother   . Head & neck cancer Paternal Grandmother        Back cancer  . Lymphoma Paternal Grandmother   . Cirrhosis Maternal Uncle   . Breast cancer Other   . Colon cancer Neg Hx   . Esophageal cancer Neg Hx   . Rectal cancer Neg Hx   . Stomach cancer Neg Hx     Social History Social History   Tobacco Use  . Smoking status: Former Smoker    Packs/day: 5.00    Years: 10.00    Pack years: 50.00    Types: Cigars  . Smokeless tobacco: Never Used  . Tobacco comment: tobacco info given 03/14/17  Substance Use Topics  . Alcohol use: Not Currently    Comment: occasionally  . Drug use: No     Allergies   Patient has no known allergies.   Review of Systems Review of Systems  Respiratory: Positive for cough and shortness of breath.   Cardiovascular: Positive for chest pain.  Musculoskeletal: Positive for neck pain.  All other systems reviewed and are negative.    Physical Exam Updated Vital Signs BP 128/81 (BP Location: Right Arm)   Pulse 85   Temp 98.4 F (36.9 C) (Oral)   Resp 16   LMP 12/19/2017   SpO2 100%   Physical Exam  Constitutional: She is oriented to person, place, and time. She appears well-developed and well-nourished.  HENT:  Head: Normocephalic and atraumatic.  Right Ear: Tympanic membrane and ear canal normal.  Left Ear: Tympanic membrane and ear canal normal.  Nose: Mucosal edema present.  Mouth/Throat: Uvula is midline, oropharynx is clear and moist and mucous membranes are normal. No  oropharyngeal exudate, posterior oropharyngeal edema, posterior oropharyngeal erythema or tonsillar abscesses.  Mild congestion  Eyes: Pupils are equal, round, and reactive to light. Conjunctivae and EOM are normal.  Neck: Normal range of motion.  Cardiovascular: Normal rate, regular rhythm and normal heart sounds.  Pulmonary/Chest: Effort normal and breath sounds normal. She has no wheezes. She has no rhonchi. She has no rales.  Right side of chest wall is somewhat tender to palpation without noted deformity, lungs are clear without any wheezes or rhonchi, no distress, able to speak in full sentences without difficulty  Abdominal: Soft. Bowel sounds are normal.  Musculoskeletal: Normal range of motion.  Lymphadenopathy:  Left tonsillar lymph node appears enlarged, mildly tender No supraclavicular, axillary, or inguinal lymphadenopathy noted  Neurological: She  is alert and oriented to person, place, and time.  Skin: Skin is warm and dry.  Psychiatric: She has a normal mood and affect.  Nursing note and vitals reviewed.    ED Treatments / Results  Labs (all labs ordered are listed, but only abnormal results are displayed)  Results for orders placed or performed during the hospital encounter of 12/19/17  Rapid HIV screen (HIV 1/2 Ab+Ag)  Result Value Ref Range   HIV-1 P24 Antigen - HIV24 NON REACTIVE NON REACTIVE   HIV 1/2 Antibodies NON REACTIVE NON REACTIVE   Interpretation (HIV Ag Ab)      A non reactive test result means that HIV 1 or HIV 2 antibodies and HIV 1 p24 antigen were not detected in the specimen.   Dg Chest 2 View  Result Date: 12/19/2017 CLINICAL DATA:  Chest pain, shortness of breath and productive cough. EXAM: CHEST - 2 VIEW COMPARISON:  10/11/2017 FINDINGS: The heart size and mediastinal contours are within normal limits. There is no evidence of pulmonary edema, consolidation, pneumothorax, nodule or pleural fluid. The visualized skeletal structures are unremarkable.  IMPRESSION: No active cardiopulmonary disease. Electronically Signed   By: Aletta Edouard M.D.   On: 12/19/2017 18:09    EKG None   Procedures Procedures (including critical care time)  Medications Ordered in ED Medications  benzonatate (TESSALON) capsule 100 mg (100 mg Oral Given 12/20/17 0251)  albuterol (PROVENTIL HFA;VENTOLIN HFA) 108 (90 Base) MCG/ACT inhaler 2 puff (2 puffs Inhalation Given 12/20/17 0251)  ibuprofen (ADVIL,MOTRIN) tablet 800 mg (800 mg Oral Given 12/20/17 0251)     Initial Impression / Assessment and Plan / ED Course  I have reviewed the triage vital signs and the nursing notes.  Pertinent labs & imaging results that were available during my care of the patient were reviewed by me and considered in my medical decision making (see chart for details).  32 year old female presenting to the ED with left-sided neck pain, cough, and intermittent chest pain.  She was actually in the ED earlier and had full work-up but had to leave prior to receiving her results.  She has since returned.  She is afebrile and nontoxic.  Her lungs are clear without any wheezes or rhonchi.  Does have some mild tenderness along the right chest wall without noted deformity or signs of trauma.  Does have some left tonsillar lymphadenopathy but no signs or symptoms concerning for strep pharyngitis on exam.  I reviewed her labs and chest x-ray from earlier, all are reassuring.  Rapid HIV was also sent during this ED visit and is nonreactive.  Her lymphadenopathy is likely reactive as she appears to have a viral upper respiratory infection.  She does not have any other areas of lymphadenopathy on my exam.  Discussed with her that it can take several weeks for lymph nodes to return back to normal size, can use warm compresses at home.  I prescribed her Tessalon, Motrin, and given albuterol inhaler here in the ED to help with symptomatic care.  She will follow-up closely with her primary care doctor.  She  understands to return here for any new or worsening symptoms.  Final Clinical Impressions(s) / ED Diagnoses   Final diagnoses:  Cough  Enlarged lymph node    ED Discharge Orders        Ordered    benzonatate (TESSALON) 100 MG capsule  Every 8 hours     12/20/17 0304    ibuprofen (ADVIL,MOTRIN) 800 MG tablet  3 times daily     12/20/17 0304       Larene Pickett, PA-C 19/50/93 2671    Delora Fuel, MD 24/58/09 [phone]

## 2017-12-20 NOTE — Discharge Instructions (Signed)
Labs and chest x-ray today looked good.  As we discussed, you do have a mildly enlarged lymph node along the left side of your neck.  Try not to mess with this.  You can use warm compresses to help.  It can take several weeks for this to go back to normal. Follow-up with your primary care doctor. Return to the ED for new or worsening symptoms.

## 2017-12-20 NOTE — ED Notes (Signed)
Called pt x3 no answer for vitals re-check

## 2017-12-20 NOTE — ED Notes (Signed)
Called pt for vitals re-check, no answer.

## 2018-01-03 NOTE — Progress Notes (Signed)
Patient ID: Natalie Thornton, female   DOB: [birth year], 32 y.o.   MRN: [id number]    Natalie Thornton, is a 32 y.o. female  GLO:[id number]  [id number]  DOB - 1986-05-30  Subjective:  Chief Complaint and HPI: Natalie Thornton is a 32 y.o. female here today for a follow up visit On and off 2 month h/o L side of neck is swollen and heavy.  Seemed to be precipitated by URI and ST about 6 weeks ago.  No weight loss.  Appetite is ok.  At times the swelling feels as though it is so much that she can't breathe well. That is not happening currently.  No dysphagia.    Seen in ED 12/19/2017. From ED A/P: 32 year old female presenting to the ED with left-sided neck pain, cough, and intermittent chest pain.  She was actually in the ED earlier and had full work-up but had to leave prior to receiving her results.  She has since returned.  She is afebrile and nontoxic.  Her lungs are clear without any wheezes or rhonchi.  Does have some mild tenderness along the right chest wall without noted deformity or signs of trauma.  Does have some left tonsillar lymphadenopathy but no signs or symptoms concerning for strep pharyngitis on exam.  I reviewed her labs and chest x-ray from earlier, all are reassuring.  Rapid HIV was also sent during this ED visit and is nonreactive.  Her lymphadenopathy is likely reactive as she appears to have a viral upper respiratory infection.  She does not have any other areas of lymphadenopathy on my exam.  Discussed with her that it can take several weeks for lymph nodes to return back to normal size, can use warm compresses at home.  I prescribed her Tessalon, Motrin, and given albuterol inhaler here in the ED to help with symptomatic care.  She will follow-up closely with her primary care doctor.  She understands to return here for any new or worsening symptoms.   ED/Hospital notes/labs reviewed.    ROS:   Constitutional:  No f/c, No night sweats, No unexplained weight loss. EENT:  No  vision changes, No blurry vision, No hearing changes. No additional mouth, throat, or ear problems.  Respiratory: No cough, No SOB Cardiac: No CP, no palpitations GI:  No abd pain, No N/V/D. GU: No Urinary s/sx Musculoskeletal: No joint pain Neuro: No headache, no dizziness, no motor weakness.  Skin: No rash Endocrine:  No polydipsia. No polyuria.  Psych: Denies SI/HI  No problems updated.  ALLERGIES: No Known Allergies  PAST MEDICAL HISTORY: Past Medical History:  Diagnosis Date  . Anxiety   . Arthritis   . DDD (degenerative disc disease)   . Degenerative disc disease, cervical   . Disc disease, degenerative, cervical   . H pylori ulcer   . Pilonidal cyst     MEDICATIONS AT HOME: Prior to Admission medications   Medication Sig Start Date End Date Taking? Authorizing Provider  acetaminophen-codeine 120-12 MG/5ML solution Take 10 mLs by mouth every 4 (four) hours as needed for moderate pain. Patient not taking: Reported on 12/20/2017 12/12/17   Dalia Heading, PA-C  benzonatate (TESSALON) 100 MG capsule Take 1 capsule (100 mg total) by mouth every 8 (eight) hours. 12/20/17   Larene Pickett, PA-C  Guaifenesin 1200 MG TB12 Take 1 tablet (1,200 mg total) by mouth 2 (two) times daily. Patient not taking: Reported on 12/20/2017 12/12/17   Dalia Heading, PA-C  ibuprofen (ADVIL,MOTRIN) 800 MG tablet Take  1 tablet (800 mg total) by mouth 3 (three) times daily. 12/20/17   Larene Pickett, PA-C  medroxyPROGESTERone (PROVERA) 10 MG tablet 1 tab daily the first week of every two months Patient not taking: Reported on 08/26/2017 06/26/17   Ladell Pier, MD  methocarbamol (ROBAXIN) 500 MG tablet Take 1 tablet (500 mg total) by mouth 3 (three) times daily. Prn muscle spasm Patient not taking: Reported on 12/20/2017 09/21/17   Argentina Donovan, PA-C  metroNIDAZOLE (METROGEL VAGINAL) 0.75 % vaginal gel Place 1 Applicatorful vaginally at bedtime. Patient not taking: Reported on  09/21/2017 08/30/17   Charlott Rakes, MD  naproxen (NAPROSYN) 500 MG tablet Take 1 tablet (500 mg total) by mouth 2 (two) times daily with a meal. Patient not taking: Reported on 12/20/2017 09/21/17   Argentina Donovan, PA-C  predniSONE (DELTASONE) 50 MG tablet Take 1 tablet (50 mg total) by mouth daily with breakfast. Patient not taking: Reported on 12/20/2017 12/12/17   Dalia Heading, PA-C  sertraline (ZOLOFT) 50 MG tablet 1/2 tab daily x 2 wks then 1 tab daily Patient not taking: Reported on 08/26/2017 06/26/17   Ladell Pier, MD  traZODone (DESYREL) 50 MG tablet Take 0.5-1 tablets (25-50 mg total) by mouth at bedtime as needed for sleep. Patient not taking: Reported on 12/20/2017 09/21/17   Argentina Donovan, PA-C  Vitamin D, Ergocalciferol, (DRISDOL) 50000 units CAPS capsule Take 1 capsule (50,000 Units total) by mouth every 7 (seven) days. Patient not taking: Reported on 12/20/2017 09/24/17   Argentina Donovan, PA-C     Objective:  EXAM:   Vitals:   01/04/18 1540  BP: 108/75  Pulse: (!) 104  Resp: 18  Temp: 98.2 F (36.8 C)  TempSrc: Oral  SpO2: 100%  Weight: 148 lb (67.1 kg)  Height: 4\' 11"  (1.499 m)    General appearance : A&OX3. NAD. Non-toxic-appearing HEENT: Atraumatic and Normocephalic.  PERRLA. EOM intact.  TM clear B. Mouth-MMM, post pharynx WNL w/o erythema, No PND.  Tonsils large but =B and non-erythematous.  Uvula midline.   Neck: supple, no JVD. +L shotty 2 small, freely mobile cervical lymphadenopathy. ?mild thyromegaly Chest/Lungs:  Breathing-non-labored, Good air entry bilaterally, breath sounds normal without rales, rhonchi, or wheezing  CVS: S1 S2 regular, no murmurs, gallops, rubs  Abdomen: Bowel sounds present, Non tender and not distended with no gaurding, rigidity or rebound. Extremities: Bilateral Lower Ext shows no edema, both legs are warm to touch with = pulse throughout Neurology:  CN II-XII grossly intact, Non focal.   Psych:  TP linear. J/I  WNL. Normal speech. Appropriate eye contact and affect.  Skin:  No Rash  Data Review No results found for: HGBA1C   Assessment & Plan   1. ?Thyromegaly - Thyroid Panel With TSH - US Soft Tissue Head/Neck; Future  2. Sore throat-persistent X 6 weeks - Mononucleosis Test, Qual W/ Reflex - Thyroid Panel With TSH - CBC with Differential/Platelet - CMV abs, IgG+IgM (cytomegalovirus)  3. Fatigue, unspecified type - Thyroid Panel With TSH - CBC with Differential/Platelet - CMV abs, IgG+IgM (cytomegalovirus)  4. Lymphadenopathy, cervical - US Soft Tissue Head/Neck; Future  5. Encounter for examination following treatment at hospital Additional testing added today     Patient have been counseled extensively about nutrition and exercise  Return in about 1 month (around 02/04/2018) for Dr johnson-throat/neck swelling.  The patient was given clear instructions to go to ER or return to medical center if symptoms don't improve, worsen or  new problems develop. The patient verbalized understanding. The patient was told to call to get lab results if they haven't heard anything in the next week.     Freeman Caldron, PA-C [facility] and [facility] [place], [place]   01/04/2018, 3:54 PM

## 2018-01-04 ENCOUNTER — Ambulatory Visit: Payer: Self-pay | Attending: Internal Medicine | Admitting: Physician Assistant

## 2018-01-04 VITALS — BP 108/75 | HR 104 | Temp 98.2°F | Resp 18 | Ht 59.0 in | Wt 148.0 lb

## 2018-01-04 DIAGNOSIS — R590 Localized enlarged lymph nodes: Secondary | ICD-10-CM

## 2018-01-04 DIAGNOSIS — R05 Cough: Secondary | ICD-10-CM | POA: Insufficient documentation

## 2018-01-04 DIAGNOSIS — R5383 Other fatigue: Secondary | ICD-10-CM

## 2018-01-04 DIAGNOSIS — J029 Acute pharyngitis, unspecified: Secondary | ICD-10-CM

## 2018-01-04 DIAGNOSIS — E010 Iodine-deficiency related diffuse (endemic) goiter: Secondary | ICD-10-CM

## 2018-01-04 DIAGNOSIS — Z09 Encounter for follow-up examination after completed treatment for conditions other than malignant neoplasm: Secondary | ICD-10-CM

## 2018-01-04 DIAGNOSIS — R079 Chest pain, unspecified: Secondary | ICD-10-CM | POA: Insufficient documentation

## 2018-01-05 ENCOUNTER — Encounter: Payer: Self-pay | Admitting: Physician Assistant

## 2018-01-05 LAB — CBC WITH DIFFERENTIAL/PLATELET
BASOS: 0 %
Basophils Absolute: 0 10*3/uL (ref 0.0–0.2)
EOS (ABSOLUTE): 0 10*3/uL (ref 0.0–0.4)
Eos: 0 %
Hematocrit: 38.3 % (ref 34.0–46.6)
Hemoglobin: 12.7 g/dL (ref 11.1–15.9)
Immature Grans (Abs): 0 10*3/uL (ref 0.0–0.1)
Immature Granulocytes: 0 %
Lymphocytes Absolute: 1.6 10*3/uL (ref 0.7–3.1)
Lymphs: 33 %
MCH: 30 pg (ref 26.6–33.0)
MCHC: 33.2 g/dL (ref 31.5–35.7)
MCV: 91 fL (ref 79–97)
MONOCYTES: 7 %
Monocytes Absolute: 0.3 10*3/uL (ref 0.1–0.9)
Neutrophils Absolute: 2.9 10*3/uL (ref 1.4–7.0)
Neutrophils: 60 %
PLATELETS: 239 10*3/uL (ref 150–450)
RBC: 4.23 x10E6/uL (ref 3.77–5.28)
RDW: 12.6 % (ref 12.3–15.4)
WBC: 4.9 10*3/uL (ref 3.4–10.8)

## 2018-01-05 LAB — CMV ABS, IGG+IGM (CYTOMEGALOVIRUS)
CMV Ab - IgG: <0.6 U/mL (ref 0.00–0.59)
CMV IgM Ser EIA-aCnc: <30 AU/mL (ref 0.0–29.9)

## 2018-01-05 LAB — THYROID PANEL WITH TSH
Free Thyroxine Index: 2.2 (ref 1.2–4.9)
T3 Uptake Ratio: 30 % (ref 24–39)
T4 TOTAL: 7.2 ug/dL (ref 4.5–12.0)
TSH: 0.41 u[IU]/mL — ABNORMAL LOW (ref 0.450–4.500)

## 2018-01-05 LAB — MONO QUAL W/RFLX QN: Mono Qual W/Rflx Qn: NEGATIVE

## 2018-01-08 ENCOUNTER — Ambulatory Visit (HOSPITAL_COMMUNITY)
Admission: RE | Admit: 2018-01-08 | Discharge: 2018-01-08 | Disposition: A | Discharge status: Home / Self Care | Payer: Medicaid Other | Source: Ambulatory Visit | Attending: Physician Assistant | Admitting: Physician Assistant

## 2018-01-08 DIAGNOSIS — E010 Iodine-deficiency related diffuse (endemic) goiter: Secondary | ICD-10-CM

## 2018-01-08 DIAGNOSIS — R590 Localized enlarged lymph nodes: Secondary | ICD-10-CM

## 2018-01-08 NOTE — Telephone Encounter (Signed)
-----   Message from Argentina Donovan, [place] sent at 01/08/2018  4:44 PM EDT ----- Your throat infection tests including mono and cytomegalovirus were negative.  Your blood count and thyroid blood work was normal.  Your thyroid ultrasound also appears normal size.  Follow-up with your primary care doctor as planned or if you continue to have problems.  Thanks, Freeman Caldron, PA-C

## 2018-01-09 ENCOUNTER — Other Ambulatory Visit: Payer: Self-pay

## 2018-01-09 ENCOUNTER — Ambulatory Visit: Payer: Self-pay | Attending: Internal Medicine | Admitting: Internal Medicine

## 2018-01-09 VITALS — BP 142/78 | HR 88 | Temp 99.0°F | Resp 16 | Wt 144.8 lb

## 2018-01-09 DIAGNOSIS — Z79899 Other long term (current) drug therapy: Secondary | ICD-10-CM | POA: Insufficient documentation

## 2018-01-09 DIAGNOSIS — F329 Major depressive disorder, single episode, unspecified: Secondary | ICD-10-CM | POA: Insufficient documentation

## 2018-01-09 DIAGNOSIS — R51 Headache: Secondary | ICD-10-CM | POA: Insufficient documentation

## 2018-01-09 DIAGNOSIS — R946 Abnormal results of thyroid function studies: Secondary | ICD-10-CM

## 2018-01-09 DIAGNOSIS — M5136 Other intervertebral disc degeneration, lumbar region: Secondary | ICD-10-CM | POA: Insufficient documentation

## 2018-01-09 DIAGNOSIS — Z1159 Encounter for screening for other viral diseases: Secondary | ICD-10-CM | POA: Insufficient documentation

## 2018-01-09 DIAGNOSIS — Z87891 Personal history of nicotine dependence: Secondary | ICD-10-CM | POA: Insufficient documentation

## 2018-01-09 DIAGNOSIS — R519 Headache, unspecified: Secondary | ICD-10-CM

## 2018-01-09 DIAGNOSIS — R5383 Other fatigue: Secondary | ICD-10-CM | POA: Insufficient documentation

## 2018-01-09 DIAGNOSIS — F419 Anxiety disorder, unspecified: Secondary | ICD-10-CM | POA: Insufficient documentation

## 2018-01-09 DIAGNOSIS — M5134 Other intervertebral disc degeneration, thoracic region: Secondary | ICD-10-CM | POA: Insufficient documentation

## 2018-01-09 DIAGNOSIS — R1013 Epigastric pain: Secondary | ICD-10-CM | POA: Insufficient documentation

## 2018-01-09 MED ORDER — INDOMETHACIN 25 MG PO CAPS: 25.0000 mg | ORAL_CAPSULE | Freq: Every day | ORAL | 0 refills | Status: DC | PRN

## 2018-01-09 NOTE — Progress Notes (Signed)
Patient ID: OMYA WINFIELD, female    DOB: Oct 07, 1985  MRN: [id number]  CC: lab work   Subjective: Kimyah Frein is a 32 y.o. female who presents for short-term follow-up regarding swelling of the left side of the neck. 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  Patient was seen in the ER last month with viral upper respiratory infection and some lymphadenopathy.  Patient reports that her sore throat, hoarseness, cough and nasal congestion have resolved but she still gets this intermittent feeling of swelling on the left side of the neck that she describes as a heavy feeling.  No chest pains.  She has also been having some tiredness and weakness.  No fever or night sweats. -Assessed to have questionable mild thyroid enlargement on visit 5 days ago.  TSH was acutely low.  Ultrasound of the neck was negative for any thyroid nodules  -No problems swallowing.  Complains of generalized Daily HA since last wk. headaches occur daily and last  few mins No N/V/photophobia.  No initiating factors.  Patient Active Problem List   Diagnosis Date Noted  . Diabetes mellitus screening 01/06/2017  . Tobacco abuse 01/06/2017  . Anxiety and depression 01/06/2017  . Dyspnea on exertion 10/20/2016  . Cigarette smoker 10/20/2016  . Solitary pulmonary nodule 10/20/2016  . Acute contact dermatitis 10/20/2016  . Pilonidal sinus 07/05/2011     Current Outpatient Medications on File Prior to Visit  Medication Sig Dispense Refill  . sertraline (ZOLOFT) 50 MG tablet 1/2 tab daily x 2 wks then 1 tab daily (Patient not taking: Reported on 08/26/2017) 30 tablet 3  . traZODone (DESYREL) 50 MG tablet Take 0.5-1 tablets (25-50 mg total) by mouth at bedtime as needed for sleep. (Patient not taking: Reported on 12/20/2017) 30 tablet 3  . Vitamin D, Ergocalciferol, (DRISDOL) 50000  units CAPS capsule Take 1 capsule (50,000 Units total) by mouth every 7 (seven) days. (Patient not taking: Reported on 12/20/2017) 16 capsule 0   No current facility-administered medications on file prior to visit.     No Known Allergies  Social History   Socioeconomic History  . Marital status: Married    Spouse name: Not on file  . Number of children: 2  . Years of education: Not on file  . Highest education level: Not on file  Occupational History  . Occupation: [employer]  . Financial resource strain: Not on file  . Food insecurity:    Worry: Not on file    Inability: Not on file  . Transportation needs:    Medical: Not on file    Non-medical: Not on file  Tobacco Use  . Smoking status: Former Smoker    Packs/day: 5.00    Years: 10.00    Pack years: 50.00    Types: Cigars  . Smokeless tobacco: Never Used  . Tobacco comment: tobacco info given 03/14/17  Substance and Sexual Activity  . Alcohol use: Not Currently    Comment: occasionally  . Drug use: No  . Sexual activity: Not on file  Lifestyle  . Physical activity:    Days per week: Not on file    Minutes per session: Not on file  . Stress: Not on file  Relationships  . Social connections:    Talks on phone: Not on file  Gets together: Not on file    Attends religious service: Not on file    Active member of club or organization: Not on file    Attends meetings of clubs or organizations: Not on file    Relationship status: Not on file  . Intimate partner violence:    Fear of current or ex partner: Not on file    Emotionally abused: Not on file    Physically abused: Not on file    Forced sexual activity: Not on file  Other Topics Concern  . Not on file  Social History Narrative  . Not on file    Family History  Problem Relation Age of Onset  . AAA (abdominal aortic aneurysm) Maternal Grandmother   . Leukemia Maternal Grandmother   . Breast cancer Maternal Grandmother   . Head &  neck cancer Paternal Grandmother        Back cancer  . Lymphoma Paternal Grandmother   . Cirrhosis Maternal Uncle   . Breast cancer Other   . Colon cancer Neg Hx   . Esophageal cancer Neg Hx   . Rectal cancer Neg Hx   . Stomach cancer Neg Hx     Past Surgical History:  Procedure Laterality Date  . COLONOSCOPY      ROS: Review of Systems  Patient requests to have hepatitis C screen and to have her liver function checked.  She has cut back on drinking to no more than 2 wine coolers several times a week.  PHYSICAL EXAM: BP (!) 142/78   Pulse 88   Temp 99 F (37.2 C) (Oral)   Resp 16   Wt 144 lb 12.8 oz (65.7 kg)   LMP 12/19/2017   SpO2 97%   BMI 29.25 kg/m   Wt Readings from Last 3 Encounters:  01/09/18 144 lb 12.8 oz (65.7 kg)  01/04/18 148 lb (67.1 kg)  12/12/17 145 lb (65.8 kg)    Physical Exam  General appearance - alert, well appearing, and in no distress Mental status - alert, oriented to person, place, and time Mouth - mucous membranes moist, pharynx normal without lesions  Neck: No cervical mass or lymphadenopathy appreciated.  Thyroid appears normal size.  No submandibular masses felt. Chest - clear to auscultation, no wheezes, rales or rhonchi, symmetric air entry Heart - normal rate, regular rhythm, normal S1, S2, no murmurs, rubs, clicks or gallops Lymphadenopathy: No axillary lymphadenopathy Neurological - cranial nerves II through XII intact, motor and sensory grossly normal bilaterally, Romberg sign negative, normal gait and station Extremities - peripheral pulses normal, no pedal edema, no clubbing or cyanosis  Results for orders placed or performed in visit on 01/04/18  Mononucleosis Test, Qual W/ Reflex  Result Value Ref Range   Mono Qual W/Rflx Qn Negative Negative  Thyroid Panel With TSH  Result Value Ref Range   TSH 0.410 (L) 0.450 - 4.500 uIU/mL   T4, Total 7.2 4.5 - 12.0 ug/dL   T3 Uptake Ratio 30 24 - 39 %   Free Thyroxine Index 2.2 1.2 -  4.9  CBC with Differential/Platelet  Result Value Ref Range   WBC 4.9 3.4 - 10.8 x10E3/uL   RBC 4.23 3.77 - 5.28 x10E6/uL   Hemoglobin 12.7 11.1 - 15.9 g/dL   Hematocrit 38.3 34.0 - 46.6 %   MCV 91 79 - 97 fL   MCH 30.0 26.6 - 33.0 pg   MCHC 33.2 31.5 - 35.7 g/dL   RDW 12.6 12.3 - 15.4 %  Platelets 239 150 - 450 x10E3/uL   Neutrophils 60 Not Estab. %   Lymphs 33 Not Estab. %   Monocytes 7 Not Estab. %   Eos 0 Not Estab. %   Basos 0 Not Estab. %   Neutrophils Absolute 2.9 1.4 - 7.0 x10E3/uL   Lymphocytes Absolute 1.6 0.7 - 3.1 x10E3/uL   Monocytes Absolute 0.3 0.1 - 0.9 x10E3/uL   EOS (ABSOLUTE) 0.0 0.0 - 0.4 x10E3/uL   Basophils Absolute 0.0 0.0 - 0.2 x10E3/uL   Immature Granulocytes 0 Not Estab. %   Immature Grans (Abs) 0.0 0.0 - 0.1 x10E3/uL  CMV abs, IgG+IgM (cytomegalovirus)  Result Value Ref Range   CMV Ab - IgG <0.60 0.00 - 0.59 U/mL   CMV IgM Ser EIA-aCnc <30.0 0.0 - 29.9 AU/mL     ASSESSMENT AND PLAN: 1. New onset of headaches -Not consistent with migraine, cluster or tension.  We will try her with Indocin.  Advised to stop all other NSAIDs. - indomethacin (INDOCIN) 25 MG capsule; Take 1 capsule (25 mg total) by mouth daily as needed. Take with food.  Do not take with ibuprofen or other over the counter pain meds  Dispense: 20 capsule; Refill: 0  2. Fatigue, unspecified type - Comprehensive metabolic panel  3. Abnormal thyroid function test - TSH+T4F+T3Free; Future  4. Need for hepatitis C screening test - Hepatitis C Antibody   Patient was given the opportunity to ask questions.  Patient verbalized understanding of the plan and was able to repeat key elements of the plan.   Orders Placed This Encounter  Procedures  . Hepatitis C Antibody  . Comprehensive metabolic panel  . [id number]     Requested Prescriptions   Signed Prescriptions Disp Refills  . indomethacin (INDOCIN) 25 MG capsule 20 capsule 0    Sig: Take 1 capsule (25 mg total) by mouth  daily as needed. Take with food.  Do not take with ibuprofen or other over the counter pain meds    No follow-ups on file.  Karle Plumber, MD, FACP

## 2018-01-09 NOTE — Progress Notes (Signed)
Pt is requesting hep c screening and to check liver and kidneys

## 2018-01-10 ENCOUNTER — Encounter: Payer: Self-pay | Admitting: Internal Medicine

## 2018-01-10 ENCOUNTER — Encounter: Payer: Self-pay | Admitting: Physician Assistant

## 2018-01-10 LAB — COMPREHENSIVE METABOLIC PANEL
A/G RATIO: 2 (ref 1.2–2.2)
ALBUMIN: 4.5 g/dL (ref 3.5–5.5)
ALT: 9 IU/L (ref 0–32)
AST: 18 IU/L (ref 0–40)
Alkaline Phosphatase: 36 IU/L — ABNORMAL LOW (ref 39–117)
BUN / CREAT RATIO: 9 (ref 9–23)
BUN: 8 mg/dL (ref 6–20)
Bilirubin Total: 0.3 mg/dL (ref 0.0–1.2)
CO2: 22 mmol/L (ref 20–29)
CREATININE: 0.91 mg/dL (ref 0.57–1.00)
Calcium: 9.9 mg/dL (ref 8.7–10.2)
Chloride: 103 mmol/L (ref 96–106)
GFR, EST AFRICAN AMERICAN: 99 mL/min/{1.73_m2} (ref >59)
GFR, EST NON AFRICAN AMERICAN: 86 mL/min/{1.73_m2} (ref >59)
Globulin, Total: 2.3 g/dL (ref 1.5–4.5)
Glucose: 71 mg/dL (ref 65–99)
POTASSIUM: 4.7 mmol/L (ref 3.5–5.2)
SODIUM: 140 mmol/L (ref 134–144)
TOTAL PROTEIN: 6.8 g/dL (ref 6.0–8.5)

## 2018-01-10 LAB — HEPATITIS C ANTIBODY: Hep C Virus Ab: <0.1 s/co ratio (ref 0.0–0.9)

## 2018-01-10 LAB — TSH+T4F+T3FREE
Free T4: 1.25 ng/dL (ref 0.82–1.77)
T3 FREE: 3.1 pg/mL (ref 2.0–4.4)
TSH: 0.593 u[IU]/mL (ref 0.450–4.500)

## 2018-02-22 ENCOUNTER — Ambulatory Visit: Payer: Self-pay | Attending: Family Medicine | Admitting: Physician Assistant

## 2018-02-22 VITALS — BP 104/67 | HR 85 | Temp 99.4°F | Resp 18 | Ht 59.0 in | Wt 137.0 lb

## 2018-02-22 DIAGNOSIS — R52 Pain, unspecified: Secondary | ICD-10-CM | POA: Insufficient documentation

## 2018-02-22 DIAGNOSIS — F419 Anxiety disorder, unspecified: Secondary | ICD-10-CM | POA: Insufficient documentation

## 2018-02-22 DIAGNOSIS — R12 Heartburn: Secondary | ICD-10-CM | POA: Insufficient documentation

## 2018-02-22 DIAGNOSIS — R110 Nausea: Secondary | ICD-10-CM | POA: Insufficient documentation

## 2018-02-22 DIAGNOSIS — Z79899 Other long term (current) drug therapy: Secondary | ICD-10-CM | POA: Insufficient documentation

## 2018-02-22 DIAGNOSIS — M1990 Unspecified osteoarthritis, unspecified site: Secondary | ICD-10-CM | POA: Insufficient documentation

## 2018-02-22 MED ORDER — OMEPRAZOLE 20 MG PO CPDR: 20.0000 mg | DELAYED_RELEASE_CAPSULE | Freq: Every day | ORAL | 3 refills | Status: DC

## 2018-02-22 NOTE — Patient Instructions (Signed)
Take tylenol or advil for body aches

## 2018-02-22 NOTE — Progress Notes (Signed)
Patient ID: Natalie Thornton, female   DOB: [birth year], 32 y.o.   MRN: [id number]   Natalie Thornton, is a 32 y.o. female  VFI:[id number]  [id number]  DOB - 04-Aug-1985  Subjective:  Chief Complaint and HPI: Natalie Thornton is a 32 y.o. female here today for nausea w/o vomiting, heartburn.  Also c/o generalized bodyaches and fatigue.  No urinary s/sx.  LMP 1st week of this month-has SSP/not pregnant. Symptoms for 1 week with fatigue and low energy.  No tick bites.  No f/c.   ROS:   Constitutional:  No f/c, No night sweats, No unexplained weight loss. EENT:  No vision changes, No blurry vision, No hearing changes. No mouth, throat, or ear problems.  Respiratory: No cough, No SOB Cardiac: No CP, no palpitations GI:  No abd pain, + N, no V/D. GU: No Urinary s/sx Musculoskeletal: No joint pain Neuro: No headache, no dizziness, no motor weakness.  Skin: No rash Endocrine:  No polydipsia. No polyuria.  Psych: Denies SI/HI  No problems updated.  ALLERGIES: No Known Allergies  PAST MEDICAL HISTORY: Past Medical History:  Diagnosis Date  . Anxiety   . Arthritis   . DDD (degenerative disc disease)   . Degenerative disc disease, cervical   . Disc disease, degenerative, cervical   . H pylori ulcer   . Pilonidal cyst     MEDICATIONS AT HOME: Prior to Admission medications   Medication Sig Start Date End Date Taking? Authorizing Provider  indomethacin (INDOCIN) 25 MG capsule Take 1 capsule (25 mg total) by mouth daily as needed. Take with food.  Do not take with ibuprofen or other over the counter pain meds Patient not taking: Reported on 02/22/2018 01/09/18   Ladell Pier, MD  omeprazole (PRILOSEC) 20 MG capsule Take 1 capsule (20 mg total) by mouth daily. 02/22/18   Argentina Donovan, PA-C  sertraline (ZOLOFT) 50 MG tablet 1/2 tab daily x 2 wks then 1 tab daily Patient not taking: Reported on 08/26/2017 06/26/17   Ladell Pier, MD  traZODone (DESYREL) 50 MG tablet Take 0.5-1  tablets (25-50 mg total) by mouth at bedtime as needed for sleep. Patient not taking: Reported on 12/20/2017 09/21/17   Argentina Donovan, PA-C     Objective:  EXAM:   Vitals:   02/22/18 1014  BP: 104/67  Pulse: 85  Resp: 18  Temp: 99.4 F (37.4 C)  TempSrc: Oral  SpO2: 99%  Weight: 137 lb (62.1 kg)  Height: 4\' 11"  (1.499 m)    General appearance : A&OX3. NAD. Non-toxic-appearing HEENT: Atraumatic and Normocephalic.  PERRLA. EOM intact.  TM clear B. Mouth-MMM, post pharynx WNL w/o erythema, No PND. Neck: supple, no JVD. No cervical lymphadenopathy. No thyromegaly Chest/Lungs:  Breathing-non-labored, Good air entry bilaterally, breath sounds normal without rales, rhonchi, or wheezing  CVS: S1 S2 regular, no murmurs, gallops, rubs  Abdomen: Bowel sounds present, Non tender and not distended with no gaurding, rigidity or rebound. Extremities: Bilateral Lower Ext shows no edema, both legs are warm to touch with = pulse throughout Neurology:  CN II-XII grossly intact, Non focal.   Psych:  TP linear. J/I WNL. Normal speech. Appropriate eye contact and flat affect.  Skin:  No Rash  Data Review No results found for: HGBA1C   Assessment & Plan   1. Body aches - Vitamin D, 25-hydroxy - CBC with Differential/Platelet  2. Heartburn - H. pylori breath test - Comprehensive metabolic panel - omeprazole (PRILOSEC) 20 MG capsule; Take  1 capsule (20 mg total) by mouth daily.  Dispense: 30 capsule; Refill: 3  3. Nausea No red flags.  Likely viral syndome     Patient have been counseled extensively about nutrition and exercise  Return in about 2 months (around 04/24/2018) for f/up Dr Wynetta Emery.  The patient was given clear instructions to go to ER or return to medical center if symptoms don't improve, worsen or new problems develop. The patient verbalized understanding. The patient was told to call to get lab results if they haven't heard anything in the next week.     Freeman Caldron, PA-C [facility] and [facility] [place], [place]   02/22/2018, 10:29 AM

## 2018-02-23 ENCOUNTER — Other Ambulatory Visit: Payer: Self-pay | Admitting: Physician Assistant

## 2018-02-23 LAB — CBC WITH DIFFERENTIAL/PLATELET
BASOS ABS: 0 10*3/uL (ref 0.0–0.2)
Basos: 0 %
EOS (ABSOLUTE): 0.1 10*3/uL (ref 0.0–0.4)
Eos: 1 %
HEMOGLOBIN: 13.3 g/dL (ref 11.1–15.9)
Hematocrit: 39.7 % (ref 34.0–46.6)
Immature Grans (Abs): 0 10*3/uL (ref 0.0–0.1)
Immature Granulocytes: 0 %
LYMPHS ABS: 1.7 10*3/uL (ref 0.7–3.1)
Lymphs: 33 %
MCH: 30 pg (ref 26.6–33.0)
MCHC: 33.5 g/dL (ref 31.5–35.7)
MCV: 90 fL (ref 79–97)
Monocytes Absolute: 0.4 10*3/uL (ref 0.1–0.9)
Monocytes: 8 %
NEUTROS ABS: 3 10*3/uL (ref 1.4–7.0)
Neutrophils: 58 %
Platelets: 265 10*3/uL (ref 150–450)
RBC: 4.43 x10E6/uL (ref 3.77–5.28)
RDW: 11.6 % — ABNORMAL LOW (ref 12.3–15.4)
WBC: 5.2 10*3/uL (ref 3.4–10.8)

## 2018-02-23 LAB — COMPREHENSIVE METABOLIC PANEL
A/G RATIO: 1.8 (ref 1.2–2.2)
ALBUMIN: 4.2 g/dL (ref 3.5–5.5)
ALT: 8 IU/L (ref 0–32)
AST: 13 IU/L (ref 0–40)
Alkaline Phosphatase: 38 IU/L — ABNORMAL LOW (ref 39–117)
BILIRUBIN TOTAL: 0.4 mg/dL (ref 0.0–1.2)
BUN / CREAT RATIO: 7 — AB (ref 9–23)
BUN: 6 mg/dL (ref 6–20)
CALCIUM: 9.6 mg/dL (ref 8.7–10.2)
CO2: 24 mmol/L (ref 20–29)
Chloride: 103 mmol/L (ref 96–106)
Creatinine, Ser: 0.88 mg/dL (ref 0.57–1.00)
GFR, EST AFRICAN AMERICAN: 102 mL/min/{1.73_m2} (ref >59)
GFR, EST NON AFRICAN AMERICAN: 88 mL/min/{1.73_m2} (ref >59)
Globulin, Total: 2.3 g/dL (ref 1.5–4.5)
Glucose: 88 mg/dL (ref 65–99)
POTASSIUM: 4.1 mmol/L (ref 3.5–5.2)
SODIUM: 139 mmol/L (ref 134–144)
TOTAL PROTEIN: 6.5 g/dL (ref 6.0–8.5)

## 2018-02-23 LAB — VITAMIN D 25 HYDROXY (VIT D DEFICIENCY, FRACTURES): VIT D 25 HYDROXY: 21.6 ng/mL — AB (ref 30.0–100.0)

## 2018-02-23 LAB — H. PYLORI BREATH TEST: H pylori Breath Test: POSITIVE — AB

## 2018-02-23 MED ORDER — VITAMIN D (ERGOCALCIFEROL) 1.25 MG (50000 UNIT) PO CAPS: 50000.0000 [IU] | ORAL_CAPSULE | ORAL | 0 refills | Status: DC

## 2018-02-23 MED FILL — VIT D2 1.25 MG (50,000 UNIT: 1.25 MG | 28 days supply | Qty: 4 | Fill #0

## 2018-02-25 ENCOUNTER — Other Ambulatory Visit: Payer: Self-pay | Admitting: Physician Assistant

## 2018-02-25 DIAGNOSIS — R12 Heartburn: Secondary | ICD-10-CM

## 2018-02-25 DIAGNOSIS — A048 Other specified bacterial intestinal infections: Secondary | ICD-10-CM

## 2018-02-25 MED ORDER — OMEPRAZOLE 20 MG PO CPDR: 20.0000 mg | DELAYED_RELEASE_CAPSULE | Freq: Two times a day (BID) | ORAL | 1 refills | Status: DC

## 2018-02-25 MED ORDER — AMOXICILLIN 500 MG PO CAPS: 1000.0000 mg | ORAL_CAPSULE | Freq: Two times a day (BID) | ORAL | 0 refills | Status: DC

## 2018-02-25 MED ORDER — CLARITHROMYCIN 500 MG PO TABS: 500.0000 mg | ORAL_TABLET | Freq: Two times a day (BID) | ORAL | 0 refills | Status: DC

## 2018-02-26 ENCOUNTER — Encounter: Payer: Self-pay | Admitting: Physician Assistant

## 2018-02-26 MED FILL — AMOXICILLIN 500 MG CAPSULE: 500 | 10 days supply | Qty: 40 | Fill #0

## 2018-02-26 MED FILL — CLARITHROMYCIN 500 MG TAB: 500 | 10 days supply | Qty: 20 | Fill #0

## 2018-02-26 MED FILL — OMEPRAZOLE 20 MG CAP: 20 | 30 days supply | Qty: 60 | Fill #0

## 2018-02-26 NOTE — Telephone Encounter (Signed)
-----   Message from Argentina Donovan, [place] sent at 02/23/2018  1:26 PM EDT ----- Your vitamin D is low.  This can contribute to muscle aches, anxiety, fatigue, and depression.  I have sent a prescription to the pharmacy for you to take once a week.  We will recheck this level in 3-4 months.  Your blood count is normal.  Your blood sugar, kidney function, liver function, and electrolytes are normal. Thanks, Natalie Caldron, PA-C

## 2018-02-26 NOTE — Telephone Encounter (Signed)
-----   Message from Argentina Donovan, [place] sent at 02/23/2018  1:26 PM EDT ----- Your vitamin D is low.  This can contribute to muscle aches, anxiety, fatigue, and depression.  I have sent a prescription to the pharmacy for you to take once a week.  We will recheck this level in 3-4 months.  Your blood count is normal.  Your blood sugar, kidney function, liver function, and electrolytes are normal. Thanks, Freeman Caldron, PA-C

## 2018-02-28 ENCOUNTER — Emergency Department (HOSPITAL_COMMUNITY)
Admission: EM | Admit: 2018-02-28 | Discharge: 2018-02-28 | Disposition: A | Discharge status: Home / Self Care | Payer: Medicaid Other | Attending: Emergency Medicine | Admitting: Emergency Medicine

## 2018-02-28 ENCOUNTER — Encounter (HOSPITAL_COMMUNITY): Payer: Self-pay | Admitting: Emergency Medicine

## 2018-02-28 ENCOUNTER — Encounter: Payer: Self-pay | Admitting: Physician Assistant

## 2018-02-28 DIAGNOSIS — K2900 Acute gastritis without bleeding: Secondary | ICD-10-CM | POA: Insufficient documentation

## 2018-02-28 DIAGNOSIS — Z79899 Other long term (current) drug therapy: Secondary | ICD-10-CM | POA: Insufficient documentation

## 2018-02-28 DIAGNOSIS — Z87891 Personal history of nicotine dependence: Secondary | ICD-10-CM | POA: Insufficient documentation

## 2018-02-28 LAB — URINALYSIS, ROUTINE W REFLEX MICROSCOPIC
Bilirubin Urine: NEGATIVE
GLUCOSE, UA: NEGATIVE mg/dL
HGB URINE DIPSTICK: NEGATIVE
Ketones, ur: NEGATIVE mg/dL
Nitrite: NEGATIVE
PROTEIN: NEGATIVE mg/dL
SPECIFIC GRAVITY, URINE: 1.025 (ref 1.005–1.030)
pH: 5.5 (ref 5.0–8.0)

## 2018-02-28 LAB — CBC
HCT: 42.6 % (ref 36.0–46.0)
HEMOGLOBIN: 13.7 g/dL (ref 12.0–15.0)
MCH: 30.2 pg (ref 26.0–34.0)
MCHC: 32.2 g/dL (ref 30.0–36.0)
MCV: 94 fL (ref 78.0–100.0)
PLATELETS: 248 10*3/uL (ref 150–400)
RBC: 4.53 MIL/uL (ref 3.87–5.11)
RDW: 12.1 % (ref 11.5–15.5)
WBC: 4.6 10*3/uL (ref 4.0–10.5)

## 2018-02-28 LAB — I-STAT BETA HCG BLOOD, ED (MC, WL, AP ONLY)

## 2018-02-28 LAB — COMPREHENSIVE METABOLIC PANEL
ALBUMIN: 3.7 g/dL (ref 3.5–5.0)
ALT: 11 U/L (ref 0–44)
ANION GAP: 9 (ref 5–15)
AST: 17 U/L (ref 15–41)
Alkaline Phosphatase: 36 U/L — ABNORMAL LOW (ref 38–126)
BUN: 11 mg/dL (ref 6–20)
CHLORIDE: 105 mmol/L (ref 98–111)
CO2: 25 mmol/L (ref 22–32)
Calcium: 9.2 mg/dL (ref 8.9–10.3)
Creatinine, Ser: 0.95 mg/dL (ref 0.44–1.00)
Glucose, Bld: 82 mg/dL (ref 70–99)
Potassium: 4.2 mmol/L (ref 3.5–5.1)
Sodium: 139 mmol/L (ref 135–145)
Total Bilirubin: 0.4 mg/dL (ref 0.3–1.2)
Total Protein: 6.6 g/dL (ref 6.5–8.1)

## 2018-02-28 LAB — URINALYSIS, MICROSCOPIC (REFLEX)

## 2018-02-28 LAB — LIPASE, BLOOD: Lipase: 35 U/L (ref 11–51)

## 2018-02-28 MED ORDER — GI COCKTAIL ~~LOC~~
30.0000 mL | Freq: Once | ORAL | Status: AC
  Administered 2018-02-28: 30 mL via ORAL
  Filled 2018-02-28: qty 30

## 2018-02-28 NOTE — ED Provider Notes (Signed)
[facility] EMERGENCY DEPARTMENT Provider Note   CSN: [id number] Arrival date & time: 02/28/18  1044     History   Chief Complaint Chief Complaint  Patient presents with  . Abdominal Pain  . Rectal Bleeding    HPI Natalie Thornton is a 32 y.o. female.  HPI   32 year old female presents today with complaints of abdominal pain.  Patient notes symptoms have been ongoing for several weeks.  She notes this is epigastric, she notes nausea, no vomiting, she reports fever on Sunday, none today.  She reports she has been tolerating p.o., noting she was having crackers and coffee prior to my evaluation which increased her epigastric abdominal pain.  Patient also reports constipation with a small amount of red blood in her stool today, no history of the same.  Patient also notes white vaginal discharge and itching since Saturday, she notes she is sexually active with females.  She did not try any medication prior to arrival.   Past Medical History:  Diagnosis Date  . Anxiety   . Arthritis   . DDD (degenerative disc disease)   . Degenerative disc disease, cervical   . Disc disease, degenerative, cervical   . H pylori ulcer   . Pilonidal cyst     Patient Active Problem List   Diagnosis Date Noted  . Diabetes mellitus screening 01/06/2017  . Tobacco abuse 01/06/2017  . Anxiety and depression 01/06/2017  . Dyspnea on exertion 10/20/2016  . Cigarette smoker 10/20/2016  . Solitary pulmonary nodule 10/20/2016  . Acute contact dermatitis 10/20/2016  . Pilonidal sinus 07/05/2011    Past Surgical History:  Procedure Laterality Date  . COLONOSCOPY       OB History   None      Home Medications    Prior to Admission medications   Medication Sig Start Date End Date Taking? Authorizing Provider  Multiple Vitamins-Minerals (ADULT GUMMY PO) Take 1 tablet by mouth daily.   Yes [provider]  amoxicillin (AMOXIL) 500 MG capsule Take 2 capsules (1,000 mg  total) by mouth 2 (two) times daily. 02/25/18   Argentina Donovan, PA-C  clarithromycin (BIAXIN) 500 MG tablet Take 1 tablet (500 mg total) by mouth 2 (two) times daily. 02/25/18   Argentina Donovan, PA-C  indomethacin (INDOCIN) 25 MG capsule Take 1 capsule (25 mg total) by mouth daily as needed. Take with food.  Do not take with ibuprofen or other over the counter pain meds Patient not taking: Reported on 02/22/2018 01/09/18   Ladell Pier, MD  omeprazole (PRILOSEC) 20 MG capsule Take 1 capsule (20 mg total) by mouth 2 (two) times daily before a meal. X 2 weeks then once daily Patient not taking: Reported on 02/28/2018 02/25/18   Argentina Donovan, PA-C  sertraline (ZOLOFT) 50 MG tablet 1/2 tab daily x 2 wks then 1 tab daily Patient not taking: Reported on 08/26/2017 06/26/17   Ladell Pier, MD  traZODone (DESYREL) 50 MG tablet Take 0.5-1 tablets (25-50 mg total) by mouth at bedtime as needed for sleep. Patient not taking: Reported on 12/20/2017 09/21/17   Argentina Donovan, PA-C  Vitamin D, Ergocalciferol, (DRISDOL) 50000 units CAPS capsule Take 1 capsule (50,000 Units total) by mouth every 7 (seven) days. 02/23/18   Argentina Donovan, PA-C    Family History Family History  Problem Relation Age of Onset  . AAA (abdominal aortic aneurysm) Maternal Grandmother   . Leukemia Maternal Grandmother   . Breast  cancer Maternal Grandmother   . Head & neck cancer Paternal Grandmother        Back cancer  . Lymphoma Paternal Grandmother   . Cirrhosis Maternal Uncle   . Breast cancer Other   . Colon cancer Neg Hx   . Esophageal cancer Neg Hx   . Rectal cancer Neg Hx   . Stomach cancer Neg Hx     Social History Social History   Tobacco Use  . Smoking status: Former Smoker    Packs/day: 5.00    Years: 10.00    Pack years: 50.00    Types: Cigars  . Smokeless tobacco: Never Used  . Tobacco comment: tobacco info given 03/14/17  Substance Use Topics  . Alcohol use: Not Currently    Comment:  occasionally  . Drug use: No     Allergies   Patient has no known allergies.   Review of Systems Review of Systems  All other systems reviewed and are negative.   Physical Exam Updated Vital Signs BP 119/79 (BP Location: Right Arm)   Pulse 92   Temp 99.4 F (37.4 C) (Oral)   Ht 4\' 11"  (1.499 m)   Wt 62.1 kg   LMP 02/06/2018   SpO2 100%   BMI 27.67 kg/m   Physical Exam  Constitutional: She is oriented to person, place, and time. She appears well-developed and well-nourished.  HENT:  Head: Normocephalic and atraumatic.  Eyes: Pupils are equal, round, and reactive to light. Conjunctivae are normal. Right eye exhibits no discharge. Left eye exhibits no discharge. No scleral icterus.  Neck: Normal range of motion. No JVD present. No tracheal deviation present.  Pulmonary/Chest: Effort normal. No stridor.  Abdominal:  Tenderness with palpation of the epigastric region remainder of abdomen soft nontender  Neurological: She is alert and oriented to person, place, and time. Coordination normal.  Psychiatric: She has a normal mood and affect. Her behavior is normal. Judgment and thought content normal.  Nursing note and vitals reviewed.   ED Treatments / Results  Labs (all labs ordered are listed, but only abnormal results are displayed) Labs Reviewed  COMPREHENSIVE METABOLIC PANEL - Abnormal; Notable for the following components:      Result Value   Alkaline Phosphatase 36 (*)    All other components within normal limits  WET PREP, GENITAL  LIPASE, BLOOD  CBC  URINALYSIS, ROUTINE W REFLEX MICROSCOPIC  I-STAT BETA HCG BLOOD, ED ([facility], [facility], [facility] ONLY)  GC/CHLAMYDIA PROBE AMP ([place]) NOT AT [facility]    EKG None  Radiology No results found.  Procedures Procedures (including critical care time)  Medications Ordered in ED Medications  gi cocktail (Maalox,Lidocaine,Donnatal) (30 mLs Oral Given 02/28/18 1239)     Initial Impression / Assessment and Plan / ED Course    I have reviewed the triage vital signs and the nursing notes.  Pertinent labs & imaging results that were available during my care of the patient were reviewed by me and considered in my medical decision making (see chart for details).     Labs: I stat beta hcg, lipase, cmp, cbc, Ua  Imaging:  Consults:  Therapeutics: GI cocktail  Discharge Meds:   Assessment/Plan: 78 YOF presents today with epigastric abdominal pain. High suspicion for gastritis.  Patient is in no acute distress she has a soft nonsurgical abdomen, with minimal epigastric tenderness to palpation.  Presentation most consistent with gastritis, patient given GI cocktail here.  Patient requesting further evaluation with imaging studies.  I do  not feel that she has any infectious or serious life-threatening process at this time including perforation infection.  Patient also refusing pelvic exam, I find this reasonable as her pain is epigastric she is having vaginal discharge, she is stable for outpatient follow-up with health department OB/GYN or primary care.  I was unable to continue my exam with the patient that she no longer wanted to be seen, was unable to discuss rectal bleeding although I do have a higher suspicion for hemorrhoids.  Patient has no acute life-threatening pathology presently and is safe for outpatient follow-up.  Patient left prior to receiving discharge paperwork.   Final Clinical Impressions(s) / ED Diagnoses   Final diagnoses:  Acute gastritis without hemorrhage, unspecified gastritis type    ED Discharge Orders    None       Francee Gentile 02/28/18 1322    Daleen Bo, MD 03/02/18 1531

## 2018-02-28 NOTE — ED Notes (Signed)
Pt left room fully dressed and with all belongings on phone.

## 2018-02-28 NOTE — ED Triage Notes (Signed)
Pt presents to ED for assessment of lower abdominal pain x 2 weeks, with vaginal discharge, red splotches in her stool, and vaginal itching.

## 2018-03-04 ENCOUNTER — Encounter (HOSPITAL_COMMUNITY): Payer: Self-pay

## 2018-03-04 ENCOUNTER — Inpatient Hospital Stay (HOSPITAL_COMMUNITY)
Admission: AD | Admit: 2018-03-04 | Discharge: 2018-03-04 | Disposition: A | Discharge status: Home / Self Care | Payer: Medicaid Other | Source: Ambulatory Visit | Attending: Obstetrics & Gynecology | Admitting: Obstetrics & Gynecology

## 2018-03-04 DIAGNOSIS — B373 Candidiasis of vulva and vagina: Secondary | ICD-10-CM | POA: Insufficient documentation

## 2018-03-04 DIAGNOSIS — Z87891 Personal history of nicotine dependence: Secondary | ICD-10-CM | POA: Insufficient documentation

## 2018-03-04 DIAGNOSIS — R109 Unspecified abdominal pain: Secondary | ICD-10-CM

## 2018-03-04 DIAGNOSIS — K921 Melena: Secondary | ICD-10-CM | POA: Insufficient documentation

## 2018-03-04 DIAGNOSIS — Z202 Contact with and (suspected) exposure to infections with a predominantly sexual mode of transmission: Secondary | ICD-10-CM

## 2018-03-04 DIAGNOSIS — R1010 Upper abdominal pain, unspecified: Secondary | ICD-10-CM | POA: Insufficient documentation

## 2018-03-04 DIAGNOSIS — B3731 Acute candidiasis of vulva and vagina: Secondary | ICD-10-CM

## 2018-03-04 LAB — URINALYSIS, ROUTINE W REFLEX MICROSCOPIC
BILIRUBIN URINE: NEGATIVE
GLUCOSE, UA: NEGATIVE mg/dL
Hgb urine dipstick: NEGATIVE
KETONES UR: NEGATIVE mg/dL
NITRITE: NEGATIVE
PH: 8 (ref 5.0–8.0)
Protein, ur: NEGATIVE mg/dL
Specific Gravity, Urine: 1.013 (ref 1.005–1.030)

## 2018-03-04 LAB — WET PREP, GENITAL
Clue Cells Wet Prep HPF POC: NONE SEEN
Sperm: NONE SEEN
TRICH WET PREP: NONE SEEN

## 2018-03-04 LAB — POCT PREGNANCY, URINE: Preg Test, Ur: NEGATIVE

## 2018-03-04 MED ORDER — TRIAMCINOLONE ACETONIDE 0.5 % EX OINT
1.0000 "application " | TOPICAL_OINTMENT | Freq: Once | CUTANEOUS | Status: AC
  Administered 2018-03-04: 1 via TOPICAL
  Filled 2018-03-04: qty 15

## 2018-03-04 MED ORDER — TRIAMCINOLONE ACETONIDE 0.5 % EX OINT: 1.0000 "application " | TOPICAL_OINTMENT | Freq: Three times a day (TID) | CUTANEOUS | 0 refills | Status: DC | PRN

## 2018-03-04 MED ORDER — FLUCONAZOLE 150 MG PO TABS
150.0000 mg | ORAL_TABLET | Freq: Once | ORAL | Status: AC
  Administered 2018-03-04: 150 mg via ORAL
  Filled 2018-03-04: qty 1

## 2018-03-04 MED ORDER — TERCONAZOLE 0.4 % VA CREA: 1.0000 | TOPICAL_CREAM | Freq: Every day | VAGINAL | 0 refills | Status: DC

## 2018-03-04 NOTE — Discharge Instructions (Signed)
Vaginal Yeast infection, Adult Vaginal yeast infection is a condition that causes soreness, swelling, and redness (inflammation) of the vagina. It also causes vaginal discharge. This is a common condition. Some women get this infection frequently. What are the causes? This condition is caused by a change in the normal balance of the yeast (candida) and bacteria that live in the vagina. This change causes an overgrowth of yeast, which causes the inflammation. What increases the risk? This condition is more likely to develop in:  Women who take antibiotic medicines.  Women who have diabetes.  Women who take birth control pills.  Women who are pregnant.  Women who douche often.  Women who have a weak defense (immune) system.  Women who have been taking steroid medicines for a long time.  Women who frequently wear tight clothing.  What are the signs or symptoms? Symptoms of this condition include:  White, thick vaginal discharge.  Swelling, itching, redness, and irritation of the vagina. The lips of the vagina (vulva) may be affected as well.  Pain or a burning feeling while urinating.  Pain during sex.  How is this diagnosed? This condition is diagnosed with a medical history and physical exam. This will include a pelvic exam. Your health care provider will examine a sample of your vaginal discharge under a microscope. Your health care provider may send this sample for testing to confirm the diagnosis. How is this treated? This condition is treated with medicine. Medicines may be over-the-counter or prescription. You may be told to use one or more of the following:  Medicine that is taken orally.  Medicine that is applied as a cream.  Medicine that is inserted directly into the vagina (suppository).  Follow these instructions at home:  Take or apply over-the-counter and prescription medicines only as told by your health care provider.  Do not have sex until your health  care provider has approved. Tell your sex partner that you have a yeast infection. That person should go to his or her health care provider if he or she develops symptoms.  Do not wear tight clothes, such as pantyhose or tight pants.  Avoid using tampons until your health care provider approves.  Eat more yogurt. This may help to keep your yeast infection from returning.  Try taking a sitz bath to help with discomfort. This is a warm water bath that is taken while you are sitting down. The water should only come up to your hips and should cover your buttocks. Do this 3-4 times per day or as told by your health care provider.  Do not douche.  Wear breathable, cotton underwear.  If you have diabetes, keep your blood sugar levels under control. Contact a health care provider if:  You have a fever.  Your symptoms go away and then return.  Your symptoms do not get better with treatment.  Your symptoms get worse.  You have new symptoms.  You develop blisters in or around your vagina.  You have blood coming from your vagina and it is not your menstrual period.  You develop pain in your abdomen. This information is not intended to replace advice given to you by your health care provider. Make sure you discuss any questions you have with your health care provider. Document Released: 03/02/2005 Document Revised: 11/04/2015 Document Reviewed: 11/24/2014 Elsevier Interactive Patient Education  2018 [place] Sex Practicing safe sex means taking steps before and during sex to reduce your risk of:  Getting  an STD (sexually transmitted disease).  Giving your partner an STD.  Unwanted pregnancy.  How can I practice safe sex?  To practice safe sex:  Limit your sexual partners to only one partner who is having sex with only you.  Avoid using alcohol and recreational drugs before having sex. These substances can affect your judgment.  Before having sex with a new  partner: ? Talk to your partner about past partners, past STDs, and drug use. ? You and your partner should be screened for STDs and discuss the results with each other.  Check your body regularly for sores, blisters, rashes, or unusual discharge. If you notice any of these problems, visit your health care provider.  If you have symptoms of an infection or you are being treated for an STD, avoid sexual contact.  While having sex, use a condom. Make sure to: ? Use a condom every time you have vaginal, oral, or anal sex. Both females and males should wear condoms during oral sex. ? Keep condoms in place from the beginning to the end of sexual activity. ? Use a latex condom, if possible. Latex condoms offer the best protection. ? Use only water-based lubricants or oils to lubricate a condom. Using petroleum-based lubricants or oils will weaken the condom and increase the chance that it will break.  See your health care provider for regular screenings, exams, and tests for STDs.  Talk with your health care provider about the form of birth control (contraception) that is best for you.  Get vaccinated against hepatitis B and human papillomavirus (HPV).  If you are at risk of being infected with HIV (human immunodeficiency virus), talk with your health care provider about taking a prescription medicine to prevent HIV infection. You are considered at risk for HIV if: ? You are a man who has sex with other men. ? You are a heterosexual man or woman who is sexually active with more than one partner. ? You take drugs by injection. ? You are sexually active with a partner who has HIV.  This information is not intended to replace advice given to you by your health care provider. Make sure you discuss any questions you have with your health care provider. Document Released: 06/30/2004 Document Revised: 10/07/2015 Document Reviewed: 04/12/2015 Elsevier Interactive Patient Education  [employer].

## 2018-03-04 NOTE — MAU Provider Note (Signed)
Chief Complaint: Vaginal Itching; Abdominal Pain; and Urinary Frequency   First Provider Initiated Contact with Patient 03/04/18 1726     SUBJECTIVE HPI: Natalie Thornton is a 32 y.o. female who presents to [facility] reporting: 1. Severe vaginal itching and irritation and vaginal discharge x 1 week.  2. Blood w/ urination (has picture on her phone. It appears to be a spot on blood in vaginal discharge) 3. Bloody stools (brought picture of stool w/ small amount of BRB.) 4. Mild Low and upper abd pain x ~1 week 5. Loose stools x ~1 week. Usually has chronic constipation which she treates w/ suppositories. Has not used any recently. 6. Questions about what STI's can be spread though oral sex. Has female partner. Oral sex only. Denies oral Sx.    Recent Dx H. Pylori by PCP. Tx. ABX, Prilosec. States abd pain feels like what she went to PCP for when she was Dx'd w/ H. Pylori.   Location: Low and upper abd Quality: crampy Severity: mild Duration: ~1 week Context: recent + H. Pylori test Timing: intermittent Modifying factors: None. Hasn't improved w/ meds, but hasn't completed Tx.   Associated signs and symptoms: Neg for fever, chills, VB, dysuria. Questionable hematuria vs vulvar bleeding from excoriation. Pos for possible blood in stool and loose stools.      Past Medical History:  Diagnosis Date  . Anxiety   . Arthritis   . DDD (degenerative disc disease)   . Degenerative disc disease, cervical   . Disc disease, degenerative, cervical   . H pylori ulcer   . Pilonidal cyst    OB History  No data available   Past Surgical History:  Procedure Laterality Date  . COLONOSCOPY     Social History   Socioeconomic History  . Marital status: Divorced    Spouse name: Not on file  . Number of children: 2  . Years of education: Not on file  . Highest education level: Not on file  Occupational History  . Occupation: [employer]  . Financial resource  strain: Not on file  . Food insecurity:    Worry: Not on file    Inability: Not on file  . Transportation needs:    Medical: Not on file    Non-medical: Not on file  Tobacco Use  . Smoking status: Former Smoker    Packs/day: 5.00    Years: 10.00    Pack years: 50.00    Types: Cigars  . Smokeless tobacco: Never Used  . Tobacco comment: tobacco info given 03/14/17  Substance and Sexual Activity  . Alcohol use: Not Currently    Comment: occasionally  . Drug use: No  . Sexual activity: Not on file  Lifestyle  . Physical activity:    Days per week: Not on file    Minutes per session: Not on file  . Stress: Not on file  Relationships  . Social connections:    Talks on phone: Not on file    Gets together: Not on file    Attends religious service: Not on file    Active member of club or organization: Not on file    Attends meetings of clubs or organizations: Not on file    Relationship status: Not on file  . Intimate partner violence:    Fear of current or ex partner: Not on file    Emotionally abused: Not on file    Physically abused: Not on file    Forced  sexual activity: Not on file  Other Topics Concern  . Not on file  Social History Narrative  . Not on file   Family History  Problem Relation Age of Onset  . AAA (abdominal aortic aneurysm) Maternal Grandmother   . Leukemia Maternal Grandmother   . Breast cancer Maternal Grandmother   . Head & neck cancer Paternal Grandmother        Back cancer  . Lymphoma Paternal Grandmother   . Cirrhosis Maternal Uncle   . Breast cancer Other   . Colon cancer Neg Hx   . Esophageal cancer Neg Hx   . Rectal cancer Neg Hx   . Stomach cancer Neg Hx    No current facility-administered medications on file prior to encounter.    Current Outpatient Medications on File Prior to Encounter  Medication Sig Dispense Refill  . amoxicillin (AMOXIL) 500 MG capsule Take 2 capsules (1,000 mg total) by mouth 2 (two) times daily. 40 capsule 0   . clarithromycin (BIAXIN) 500 MG tablet Take 1 tablet (500 mg total) by mouth 2 (two) times daily. 20 tablet 0  . indomethacin (INDOCIN) 25 MG capsule Take 1 capsule (25 mg total) by mouth daily as needed. Take with food.  Do not take with ibuprofen or other over the counter pain meds 20 capsule 0  . omeprazole (PRILOSEC) 20 MG capsule Take 1 capsule (20 mg total) by mouth 2 (two) times daily before a meal. X 2 weeks then once daily 60 capsule 1  . Multiple Vitamins-Minerals (ADULT GUMMY PO) Take 1 tablet by mouth daily.    . sertraline (ZOLOFT) 50 MG tablet 1/2 tab daily x 2 wks then 1 tab daily (Patient not taking: Reported on 08/26/2017) 30 tablet 3  . traZODone (DESYREL) 50 MG tablet Take 0.5-1 tablets (25-50 mg total) by mouth at bedtime as needed for sleep. (Patient not taking: Reported on 12/20/2017) 30 tablet 3  . Vitamin D, Ergocalciferol, (DRISDOL) 50000 units CAPS capsule Take 1 capsule (50,000 Units total) by mouth every 7 (seven) days. 16 capsule 0   No Known Allergies  I have reviewed patient's Past Medical Hx, Surgical Hx, Family Hx, Social Hx, medications and allergies.   Review of Systems  Constitutional: Negative for chills and fever.  HENT: Negative for mouth sores and sore throat.   Gastrointestinal: Positive for abdominal pain, blood in stool and diarrhea. Negative for abdominal distention, constipation, nausea and vomiting.  Genitourinary: Positive for hematuria (questionable) and vaginal discharge. Negative for dysuria, frequency, genital sores, menstrual problem, pelvic pain, urgency and vaginal bleeding.       Vulvar and vaginal pruritis    OBJECTIVE Patient Vitals for the past 24 hrs:  BP Temp Temp src Pulse Resp Weight  03/04/18 1545 115/78 99 F (37.2 C) Oral 86 18 63 kg   Constitutional: Well-developed, well-nourished female in no acute distress.  Cardiovascular: normal rate Respiratory: normal rate and effort.  GI: Abd soft, mild generalized tenderness to  palpation. Neurologic: Alert and oriented x 4.  GU: Neg CVAT.  SPECULUM EXAM: NEFG, mild excoriation, small amount of thick, white, odorless, curdlike discharge, no blood noted, vaginal walls erythematous, cervix clean  BIMANUAL: cervix closed; uterus normal size, no adnexal tenderness or masses. No CMT.  LAB RESULTS Results for orders placed or performed during the hospital encounter of 03/04/18 (from the past 24 hour(s))  Urinalysis, Routine w reflex microscopic     Status: Abnormal   Collection Time: 03/04/18  3:47 PM  Result Value  Ref Range   Color, Urine YELLOW YELLOW   APPearance CLEAR CLEAR   Specific Gravity, Urine 1.013 1.005 - 1.030   pH 8.0 5.0 - 8.0   Glucose, UA NEGATIVE NEGATIVE mg/dL   Hgb urine dipstick NEGATIVE NEGATIVE   Bilirubin Urine NEGATIVE NEGATIVE   Ketones, ur NEGATIVE NEGATIVE mg/dL   Protein, ur NEGATIVE NEGATIVE mg/dL   Nitrite NEGATIVE NEGATIVE   Leukocytes, UA SMALL (A) NEGATIVE   RBC / HPF 0-5 0 - 5 RBC/hpf   WBC, UA 0-5 0 - 5 WBC/hpf   Bacteria, UA RARE (A) NONE SEEN   Squamous Epithelial / LPF 6-10 0 - 5  Pregnancy, urine POC     Status: None   Collection Time: 03/04/18  4:05 PM  Result Value Ref Range   Preg Test, Ur NEGATIVE NEGATIVE  Wet prep, genital     Status: Abnormal   Collection Time: 03/04/18  4:48 PM  Result Value Ref Range   Yeast Wet Prep HPF POC PRESENT (A) NONE SEEN   Trich, Wet Prep NONE SEEN NONE SEEN   Clue Cells Wet Prep HPF POC NONE SEEN NONE SEEN   WBC, Wet Prep HPF POC MODERATE (A) NONE SEEN   Sperm NONE SEEN     IMAGING No results found.  MAU COURSE Orders Placed This Encounter  Procedures  . Wet prep, genital  . Urine Culture  . Urinalysis, Routine w reflex microscopic  . Lab instructions  . Pregnancy, urine POC  . Discharge patient   Meds ordered this encounter  Medications  . fluconazole (DIFLUCAN) tablet 150 mg  . triamcinolone ointment (KENALOG) 0.5 % 1 application  . triamcinolone ointment  (KENALOG) 0.5 %    Sig: Apply 1 application topically 3 (three) times daily as needed. For itching    Dispense:  30 g    Refill:  0    Order Specific Question:   Supervising Provider    Answer:   EURE, LUTHER H [[id number]]  . terconazole (TERAZOL 7) 0.4 % vaginal cream    Sig: Place 1 applicator vaginally at bedtime.    Dispense:  45 g    Refill:  0    Order Specific Question:   Supervising Provider    Answer:   Tania Ade H [[id number]]    MDM - VVC w/ severe pruritis. Diflucan, Triamcinolone give. Terazol 7 Rx'd for persistent Sx.  - Mild generalized abd pain possibly 2/2 H. Pylori vs ABX - Loose, bloody stools possibly 2/2 ABX. F/U w/ PCP. - Double hematuria. No other evidence of UTI. Blood seem w./ urination likely from vulvar bleeding from excoriation. Tx pruritis w/ Triamcinolone.  ASSESSMENT 1. Vaginal yeast infection   2. Possible exposure to STD   3. Abdominal pain in female   4. Blood in stool     PLAN Discharge home in stable condition. Safe sex practices reviewed.  HIV, GC/Chlamydia cultures pending.  Follow-up Information    Ladell Pier, MD Follow up.   Specialty:  Internal Medicine Why:  Blood in stool and change in bowel habits Contact information: [place] [address] [phone]        Gynecologist of your choice Follow up.   Why:  As needed for nonemergent gynecology issues       [facility] Follow up.   Why:  as needed in gynecology emergencies Contact information: [address] [id number] [place] [phone]  Allergies as of 03/04/2018   No Known Allergies     Medication List    STOP taking these medications   sertraline 50 MG tablet Commonly known as:  ZOLOFT   traZODone 50 MG tablet Commonly known as:  DESYREL     TAKE these medications   ADULT GUMMY PO Take 1 tablet by mouth daily.   amoxicillin 500 MG  capsule Commonly known as:  AMOXIL Take 2 capsules (1,000 mg total) by mouth 2 (two) times daily.   clarithromycin 500 MG tablet Commonly known as:  BIAXIN Take 1 tablet (500 mg total) by mouth 2 (two) times daily.   indomethacin 25 MG capsule Commonly known as:  INDOCIN Take 1 capsule (25 mg total) by mouth daily as needed. Take with food.  Do not take with ibuprofen or other over the counter pain meds   omeprazole 20 MG capsule Commonly known as:  PRILOSEC Take 1 capsule (20 mg total) by mouth 2 (two) times daily before a meal. X 2 weeks then once daily   terconazole 0.4 % vaginal cream Commonly known as:  TERAZOL 7 Place 1 applicator vaginally at bedtime.   triamcinolone ointment 0.5 % Commonly known as:  KENALOG Apply 1 application topically 3 (three) times daily as needed. For itching   Vitamin D (Ergocalciferol) 50000 units Caps capsule Commonly known as:  DRISDOL Take 1 capsule (50,000 Units total) by mouth every 7 (seven) days.        Tamala Julian, [place], [place] 03/04/2018  6:09 PM

## 2018-03-04 NOTE — MAU Note (Addendum)
Vaginal itching since 09/22 and has been getting, has not tried anything OTC. States it feels swollen on the inside  Stomach pain for a couple weeks, blood in poop and urine. Hx of stomach ulcers and states this pain feels similar  Reports some increased urinary frequency

## 2018-03-05 LAB — URINE CULTURE
Culture: <10000 — AB
Special Requests: NORMAL

## 2018-03-05 LAB — GC/CHLAMYDIA PROBE AMP (~~LOC~~) NOT AT ARMC
CHLAMYDIA, DNA PROBE: NEGATIVE
Neisseria Gonorrhea: NEGATIVE

## 2018-03-07 ENCOUNTER — Encounter: Payer: Self-pay | Admitting: Internal Medicine

## 2018-03-08 ENCOUNTER — Encounter: Payer: Self-pay | Admitting: Physician Assistant

## 2018-03-12 ENCOUNTER — Other Ambulatory Visit: Payer: Self-pay

## 2018-03-12 ENCOUNTER — Encounter (HOSPITAL_COMMUNITY): Payer: Self-pay | Admitting: Emergency Medicine

## 2018-03-12 ENCOUNTER — Emergency Department (HOSPITAL_COMMUNITY)
Admission: EM | Admit: 2018-03-12 | Discharge: 2018-03-13 | Disposition: A | Discharge status: Home / Self Care | Payer: Medicaid Other | Attending: Emergency Medicine | Admitting: Emergency Medicine

## 2018-03-12 DIAGNOSIS — Z87891 Personal history of nicotine dependence: Secondary | ICD-10-CM | POA: Insufficient documentation

## 2018-03-12 DIAGNOSIS — R197 Diarrhea, unspecified: Secondary | ICD-10-CM | POA: Insufficient documentation

## 2018-03-12 DIAGNOSIS — Z79899 Other long term (current) drug therapy: Secondary | ICD-10-CM | POA: Insufficient documentation

## 2018-03-12 DIAGNOSIS — R112 Nausea with vomiting, unspecified: Secondary | ICD-10-CM | POA: Insufficient documentation

## 2018-03-12 LAB — I-STAT BETA HCG BLOOD, ED (MC, WL, AP ONLY)

## 2018-03-12 LAB — COMPREHENSIVE METABOLIC PANEL
ALK PHOS: 30 U/L — AB (ref 38–126)
ALT: 13 U/L (ref 0–44)
ANION GAP: 10 (ref 5–15)
AST: 22 U/L (ref 15–41)
Albumin: 3.9 g/dL (ref 3.5–5.0)
BUN: 8 mg/dL (ref 6–20)
CALCIUM: 9.2 mg/dL (ref 8.9–10.3)
CO2: 22 mmol/L (ref 22–32)
Chloride: 103 mmol/L (ref 98–111)
Creatinine, Ser: 0.91 mg/dL (ref 0.44–1.00)
GFR calc Af Amer: >60 mL/min (ref >60)
Glucose, Bld: 94 mg/dL (ref 70–99)
Potassium: 3.2 mmol/L — ABNORMAL LOW (ref 3.5–5.1)
Sodium: 135 mmol/L (ref 135–145)
TOTAL PROTEIN: 6.6 g/dL (ref 6.5–8.1)
Total Bilirubin: 0.8 mg/dL (ref 0.3–1.2)

## 2018-03-12 LAB — URINALYSIS, ROUTINE W REFLEX MICROSCOPIC
Bilirubin Urine: NEGATIVE
Glucose, UA: NEGATIVE mg/dL
Hgb urine dipstick: NEGATIVE
Ketones, ur: 20 mg/dL — AB
LEUKOCYTES UA: NEGATIVE
NITRITE: NEGATIVE
PROTEIN: NEGATIVE mg/dL
Specific Gravity, Urine: 1.014 (ref 1.005–1.030)
pH: 6 (ref 5.0–8.0)

## 2018-03-12 LAB — CBC
HCT: 38.3 % (ref 36.0–46.0)
Hemoglobin: 12.7 g/dL (ref 12.0–15.0)
MCH: 30.5 pg (ref 26.0–34.0)
MCHC: 33.2 g/dL (ref 30.0–36.0)
MCV: 91.8 fL (ref 78.0–100.0)
Platelets: 229 10*3/uL (ref 150–400)
RBC: 4.17 MIL/uL (ref 3.87–5.11)
RDW: 12 % (ref 11.5–15.5)
WBC: 6.4 10*3/uL (ref 4.0–10.5)

## 2018-03-12 LAB — LIPASE, BLOOD: LIPASE: 35 U/L (ref 11–51)

## 2018-03-12 MED ORDER — LOPERAMIDE HCL 2 MG PO CAPS
4.0000 mg | ORAL_CAPSULE | Freq: Once | ORAL | Status: AC
  Administered 2018-03-12: 4 mg via ORAL
  Filled 2018-03-12: qty 2

## 2018-03-12 MED ORDER — SODIUM CHLORIDE 0.9 % IV BOLUS (SEPSIS)
1000.0000 mL | Freq: Once | INTRAVENOUS | Status: AC
  Administered 2018-03-12: 1000 mL via INTRAVENOUS

## 2018-03-12 MED ORDER — ONDANSETRON HCL 4 MG/2ML IJ SOLN
4.0000 mg | Freq: Once | INTRAMUSCULAR | Status: AC
  Administered 2018-03-12: 4 mg via INTRAVENOUS
  Filled 2018-03-12: qty 2

## 2018-03-12 MED ORDER — KETOROLAC TROMETHAMINE 30 MG/ML IJ SOLN
30.0000 mg | Freq: Once | INTRAMUSCULAR | Status: AC
  Administered 2018-03-12: 30 mg via INTRAVENOUS
  Filled 2018-03-12: qty 1

## 2018-03-12 MED ORDER — ONDANSETRON 4 MG PO TBDP
4.0000 mg | ORAL_TABLET | Freq: Once | ORAL | Status: AC | PRN
  Administered 2018-03-12: 4 mg via ORAL
  Filled 2018-03-12: qty 1

## 2018-03-12 NOTE — ED Provider Notes (Signed)
TIME SEEN: 11:38 PM  CHIEF COMPLAINT: Nausea, vomiting, diarrhea  HPI: Patient is a 32 year old female with history of H. pylori who presents to the emergency department with diarrhea that started earlier today, diffuse abdominal cramps and one episode of vomiting.  Reports she has had a headache and felt lightheaded.  Denies any sick contacts or recent travel.  No previous abdominal surgery.  No bloody stool or melena.  No dysuria, hematuria, vaginal bleeding or discharge.  ROS: See HPI Constitutional: no fever  Eyes: no drainage  ENT: no runny nose   Cardiovascular:  no chest pain  Resp: no SOB  GI: Diarrhea and vomiting GU: no dysuria Integumentary: no rash  Allergy: no hives  Musculoskeletal: no leg swelling  Neurological: no slurred speech ROS otherwise negative  PAST MEDICAL HISTORY/PAST SURGICAL HISTORY:  Past Medical History:  Diagnosis Date  . Anxiety   . Arthritis   . DDD (degenerative disc disease)   . Degenerative disc disease, cervical   . Disc disease, degenerative, cervical   . H pylori ulcer   . Pilonidal cyst     MEDICATIONS:  Prior to Admission medications   Medication Sig Start Date End Date Taking? Authorizing Provider  amoxicillin (AMOXIL) 500 MG capsule Take 2 capsules (1,000 mg total) by mouth 2 (two) times daily. 02/25/18   Argentina Donovan, PA-C  clarithromycin (BIAXIN) 500 MG tablet Take 1 tablet (500 mg total) by mouth 2 (two) times daily. 02/25/18   Argentina Donovan, PA-C  indomethacin (INDOCIN) 25 MG capsule Take 1 capsule (25 mg total) by mouth daily as needed. Take with food.  Do not take with ibuprofen or other over the counter pain meds 01/09/18   Ladell Pier, MD  Multiple Vitamins-Minerals (ADULT GUMMY PO) Take 1 tablet by mouth daily.    [provider]  omeprazole (PRILOSEC) 20 MG capsule Take 1 capsule (20 mg total) by mouth 2 (two) times daily before a meal. X 2 weeks then once daily 02/25/18   Argentina Donovan, PA-C   terconazole (TERAZOL 7) 0.4 % vaginal cream Place 1 applicator vaginally at bedtime. 03/04/18   Tamala Julian, [place], CNM  triamcinolone ointment (KENALOG) 0.5 % Apply 1 application topically 3 (three) times daily as needed. For itching 03/04/18   Tamala Julian, [place], CNM  Vitamin D, Ergocalciferol, (DRISDOL) 50000 units CAPS capsule Take 1 capsule (50,000 Units total) by mouth every 7 (seven) days. 02/23/18   Argentina Donovan, PA-C    ALLERGIES:  No Known Allergies  SOCIAL HISTORY:  Social History   Tobacco Use  . Smoking status: Former Smoker    Packs/day: 5.00    Years: 10.00    Pack years: 50.00    Types: Cigars  . Smokeless tobacco: Never Used  . Tobacco comment: tobacco info given 03/14/17  Substance Use Topics  . Alcohol use: Not Currently    Comment: occasionally    FAMILY HISTORY: Family History  Problem Relation Age of Onset  . AAA (abdominal aortic aneurysm) Maternal Grandmother   . Leukemia Maternal Grandmother   . Breast cancer Maternal Grandmother   . Head & neck cancer Paternal Grandmother        Back cancer  . Lymphoma Paternal Grandmother   . Cirrhosis Maternal Uncle   . Breast cancer Other   . Colon cancer Neg Hx   . Esophageal cancer Neg Hx   . Rectal cancer Neg Hx   . Stomach cancer Neg Hx     EXAM: BP 136/85  Pulse 91   Temp 98.7 F (37.1 C) (Oral)   Resp 20   LMP 03/05/2018   SpO2 100%  CONSTITUTIONAL: Alert and oriented and responds appropriately to questions. Well-appearing; well-nourished HEAD: Normocephalic EYES: Conjunctivae clear, pupils appear equal, EOMI ENT: normal nose; moist mucous membranes NECK: Supple, no meningismus, no nuchal rigidity, no LAD  CARD: RRR; S1 and S2 appreciated; no murmurs, no clicks, no rubs, no gallops RESP: Normal chest excursion without splinting or tachypnea; breath sounds clear and equal bilaterally; no wheezes, no rhonchi, no rales, no hypoxia or respiratory distress, speaking full sentences ABD/GI: Normal  bowel sounds; non-distended; soft, non-tender, no rebound, no guarding, no peritoneal signs, no hepatosplenomegaly BACK:  The back appears normal and is non-tender to palpation, there is no CVA tenderness EXT: Normal ROM in all joints; non-tender to palpation; no edema; normal capillary refill; no cyanosis, no calf tenderness or swelling    SKIN: Normal color for age and race; warm; no rash NEURO: Moves all extremities equally PSYCH: The patient's mood and manner are appropriate. Grooming and personal hygiene are appropriate.  MEDICAL DECISION MAKING: Patient here with likely viral gastroenteritis.  Abdominal exam is benign.  Labs are unremarkable.  Urine shows no sign of infection but does show small ketones.  Will treat symptoms with IV fluids, Toradol, Zofran, Imodium and reassess.  Doubt appendicitis, colitis, diverticulitis, cholecystitis, pancreatitis, bowel obstruction, perforation.  I do not feel she needs emergent imaging of her abdomen at this time.  ED PROGRESS: Patient reports feeling much better.  Able to tolerate ginger ale without difficulty.  Again suspect viral gastroenteritis.  Will discharge with prescriptions of Bentyl, Zofran, Imodium.  Recommended bland diet and increase fluid intake.  Patient comfortable with this plan.   At this time, I do not feel there is any life-threatening condition present. I have reviewed and discussed all results (EKG, imaging, lab, urine as appropriate) and exam findings with patient/family. I have reviewed nursing notes and appropriate previous records.  I feel the patient is safe to be discharged home without further emergent workup and can continue workup as an outpatient as needed. Discussed usual and customary return precautions. Patient/family verbalize understanding and are comfortable with this plan.  Outpatient follow-up has been provided if needed. All questions have been answered.      Cesar Alf, Delice Bison, DO 03/13/18 [phone]

## 2018-03-12 NOTE — ED Triage Notes (Signed)
Pt reports lower abdominal pain starting today. Pt reports diarrhea, nausea, and 1 episode of vomiting. Pt denies fever, change in urination. Pt reports hx of stomach ulcers.

## 2018-03-13 MED ORDER — ONDANSETRON 4 MG PO TBDP: 4.0000 mg | ORAL_TABLET | Freq: Four times a day (QID) | ORAL | 0 refills | Status: DC | PRN

## 2018-03-13 MED ORDER — DICYCLOMINE HCL 20 MG PO TABS: 20.0000 mg | ORAL_TABLET | Freq: Three times a day (TID) | ORAL | 0 refills | Status: DC | PRN

## 2018-03-13 MED ORDER — LOPERAMIDE HCL 2 MG PO CAPS: 2.0000 mg | ORAL_CAPSULE | Freq: Four times a day (QID) | ORAL | 0 refills | Status: DC | PRN

## 2018-03-19 ENCOUNTER — Encounter: Payer: Self-pay | Admitting: Internal Medicine

## 2018-03-21 ENCOUNTER — Encounter: Payer: Self-pay | Admitting: Internal Medicine

## 2018-03-22 ENCOUNTER — Telehealth: Payer: Self-pay

## 2018-03-22 NOTE — Telephone Encounter (Signed)
Contacted [facility] to see if they had a sooner appointment than Nov 6. Spoke to [place] and reschedule pt for October 18 @130pm . Lesly Rubenstein stated that she will be seeing Maylon Peppers.   Contacted pt and made her aware of the GI appointment changes. Pt states she will be going tomorrow. Informed pt that is she has any other questions or concerns to give [place] a call

## 2018-03-23 ENCOUNTER — Encounter

## 2018-03-23 ENCOUNTER — Ambulatory Visit: Payer: Self-pay | Admitting: Gastroenterology

## 2018-03-24 ENCOUNTER — Emergency Department (HOSPITAL_COMMUNITY): Payer: Self-pay

## 2018-03-24 ENCOUNTER — Encounter (HOSPITAL_COMMUNITY): Payer: Self-pay | Admitting: Emergency Medicine

## 2018-03-24 ENCOUNTER — Emergency Department (HOSPITAL_COMMUNITY)
Admission: EM | Admit: 2018-03-24 | Discharge: 2018-03-24 | Disposition: A | Discharge status: Home / Self Care | Payer: Self-pay | Attending: Emergency Medicine | Admitting: Emergency Medicine

## 2018-03-24 DIAGNOSIS — E119 Type 2 diabetes mellitus without complications: Secondary | ICD-10-CM | POA: Insufficient documentation

## 2018-03-24 DIAGNOSIS — Z87891 Personal history of nicotine dependence: Secondary | ICD-10-CM | POA: Insufficient documentation

## 2018-03-24 DIAGNOSIS — M25511 Pain in right shoulder: Secondary | ICD-10-CM | POA: Insufficient documentation

## 2018-03-24 DIAGNOSIS — Z79899 Other long term (current) drug therapy: Secondary | ICD-10-CM | POA: Insufficient documentation

## 2018-03-24 LAB — CBC
HCT: 42.4 % (ref 36.0–46.0)
Hemoglobin: 13.2 g/dL (ref 12.0–15.0)
MCH: 29.1 pg (ref 26.0–34.0)
MCHC: 31.1 g/dL (ref 30.0–36.0)
MCV: 93.4 fL (ref 80.0–100.0)
NRBC: 0 % (ref 0.0–0.2)
PLATELETS: 311 10*3/uL (ref 150–400)
RBC: 4.54 MIL/uL (ref 3.87–5.11)
RDW: 12.5 % (ref 11.5–15.5)
WBC: 4.6 10*3/uL (ref 4.0–10.5)

## 2018-03-24 LAB — I-STAT BETA HCG BLOOD, ED (MC, WL, AP ONLY)

## 2018-03-24 LAB — COMPREHENSIVE METABOLIC PANEL
ALT: 15 U/L (ref 0–44)
AST: 23 U/L (ref 15–41)
Albumin: 4.2 g/dL (ref 3.5–5.0)
Alkaline Phosphatase: 37 U/L — ABNORMAL LOW (ref 38–126)
Anion gap: 6 (ref 5–15)
CHLORIDE: 107 mmol/L (ref 98–111)
CO2: 25 mmol/L (ref 22–32)
Calcium: 9.5 mg/dL (ref 8.9–10.3)
Creatinine, Ser: 0.93 mg/dL (ref 0.44–1.00)
GFR calc Af Amer: >60 mL/min (ref >60)
GFR calc non Af Amer: >60 mL/min (ref >60)
Glucose, Bld: 100 mg/dL — ABNORMAL HIGH (ref 70–99)
POTASSIUM: 3.6 mmol/L (ref 3.5–5.1)
Sodium: 138 mmol/L (ref 135–145)
TOTAL PROTEIN: 7 g/dL (ref 6.5–8.1)
Total Bilirubin: 0.4 mg/dL (ref 0.3–1.2)

## 2018-03-24 LAB — URINALYSIS, ROUTINE W REFLEX MICROSCOPIC
Bilirubin Urine: NEGATIVE
Glucose, UA: NEGATIVE mg/dL
Hgb urine dipstick: NEGATIVE
Ketones, ur: NEGATIVE mg/dL
LEUKOCYTES UA: NEGATIVE
NITRITE: NEGATIVE
PROTEIN: NEGATIVE mg/dL
Specific Gravity, Urine: 1.005 (ref 1.005–1.030)
pH: 8 (ref 5.0–8.0)

## 2018-03-24 LAB — LIPASE, BLOOD: LIPASE: 42 U/L (ref 11–51)

## 2018-03-24 MED ORDER — HYDROCODONE-ACETAMINOPHEN 5-325 MG PO TABS
1.0000 | ORAL_TABLET | Freq: Once | ORAL | Status: AC
  Administered 2018-03-24: 1 via ORAL
  Filled 2018-03-24: qty 1

## 2018-03-24 MED ORDER — HYDROCODONE-ACETAMINOPHEN 5-325 MG PO TABS: 1.0000 | ORAL_TABLET | Freq: Four times a day (QID) | ORAL | 0 refills | Status: DC | PRN

## 2018-03-24 NOTE — Discharge Instructions (Signed)
You were evaluated in the emergency department for right shoulder pain that is been worsening over the past few weeks.  Your x-ray did not show any obvious signs of a fracture.  You also had some basic lab work done that did not show any obvious abnormalities.  We are prescribing some medicine to help with the pain but ultimately you will need to follow-up with your doctor and probably orthopedic surgery for further evaluations.

## 2018-03-24 NOTE — ED Notes (Signed)
Results reviewed, no changes in acuity at this time 

## 2018-03-24 NOTE — ED Triage Notes (Signed)
Pt to ER for evaluation of epigastric abdominal pain with nausea, no vomiting or diarrhea. Reports ongoing for a "couple of weeks." reports also right arm pain without injury. She reports feeling like her right arm and right back are swelling.

## 2018-03-24 NOTE — ED Provider Notes (Signed)
[place] EMERGENCY DEPARTMENT Provider Note   CSN: [id number] Arrival date & time: 03/24/18  1509     History   Chief Complaint Chief Complaint  Patient presents with  . Abdominal Pain    HPI Natalie Thornton is a 32 y.o. female.  32 year old female here with right shoulder pain.  She says is been going on a couple weeks it started in her upper right chest and now migrated over to her anterior and posterior shoulder.  The pain is increased with any movement.  She is been trying some massage to the area that does not help.  She denies any injury said she just woke up with it.  She triage did also mention some abdominal pain but she feels that this is related to her gastritis and ulcers and she is not concerned about that today.  No fevers no chills no vomiting no urinary symptoms.  The history is provided by the patient.  Shoulder Pain   This is a new problem. The current episode started more than 1 week ago. The problem occurs constantly. The problem has not changed since onset.The pain is present in the right shoulder. The quality of the pain is described as sharp. The pain is severe. Associated symptoms include limited range of motion. Pertinent negatives include no numbness and no tingling. The symptoms are aggravated by activity. She has tried rest for the symptoms. The treatment provided mild relief. There has been no history of extremity trauma. Family history is significant for no gout.    Past Medical History:  Diagnosis Date  . Anxiety   . Arthritis   . DDD (degenerative disc disease)   . Degenerative disc disease, cervical   . Disc disease, degenerative, cervical   . H pylori ulcer   . Pilonidal cyst     Patient Active Problem List   Diagnosis Date Noted  . Diabetes mellitus screening 01/06/2017  . Tobacco abuse 01/06/2017  . Anxiety and depression 01/06/2017  . Dyspnea on exertion 10/20/2016  . Cigarette smoker 10/20/2016  . Solitary  pulmonary nodule 10/20/2016  . Acute contact dermatitis 10/20/2016  . Pilonidal sinus 07/05/2011    Past Surgical History:  Procedure Laterality Date  . COLONOSCOPY       OB History   None      Home Medications    Prior to Admission medications   Medication Sig Start Date End Date Taking? Authorizing Provider  amoxicillin (AMOXIL) 500 MG capsule Take 2 capsules (1,000 mg total) by mouth 2 (two) times daily. 02/25/18   Argentina Donovan, PA-C  clarithromycin (BIAXIN) 500 MG tablet Take 1 tablet (500 mg total) by mouth 2 (two) times daily. 02/25/18   Argentina Donovan, PA-C  dicyclomine (BENTYL) 20 MG tablet Take 1 tablet (20 mg total) by mouth every 8 (eight) hours as needed for spasms (Abdominal cramping). 03/13/18   Ward, Delice Bison, DO  indomethacin (INDOCIN) 25 MG capsule Take 1 capsule (25 mg total) by mouth daily as needed. Take with food.  Do not take with ibuprofen or other over the counter pain meds 01/09/18   Ladell Pier, MD  loperamide (IMODIUM) 2 MG capsule Take 1 capsule (2 mg total) by mouth 4 (four) times daily as needed for diarrhea or loose stools. 03/13/18   Ward, Delice Bison, DO  Multiple Vitamins-Minerals (ADULT GUMMY PO) Take 1 tablet by mouth daily.    [provider]  omeprazole (PRILOSEC) 20 MG capsule Take 1 capsule (  20 mg total) by mouth 2 (two) times daily before a meal. X 2 weeks then once daily 02/25/18   Argentina Donovan, PA-C  ondansetron (ZOFRAN ODT) 4 MG disintegrating tablet Take 1 tablet (4 mg total) by mouth every 6 (six) hours as needed. 03/13/18   Ward, Delice Bison, DO  terconazole (TERAZOL 7) 0.4 % vaginal cream Place 1 applicator vaginally at bedtime. 03/04/18   Tamala Julian, [place], CNM  triamcinolone ointment (KENALOG) 0.5 % Apply 1 application topically 3 (three) times daily as needed. For itching 03/04/18   Tamala Julian, [place], CNM  Vitamin D, Ergocalciferol, (DRISDOL) 50000 units CAPS capsule Take 1 capsule (50,000 Units total) by mouth every 7  (seven) days. 02/23/18   Argentina Donovan, PA-C    Family History Family History  Problem Relation Age of Onset  . AAA (abdominal aortic aneurysm) Maternal Grandmother   . Leukemia Maternal Grandmother   . Breast cancer Maternal Grandmother   . Head & neck cancer Paternal Grandmother        Back cancer  . Lymphoma Paternal Grandmother   . Cirrhosis Maternal Uncle   . Breast cancer Other   . Colon cancer Neg Hx   . Esophageal cancer Neg Hx   . Rectal cancer Neg Hx   . Stomach cancer Neg Hx     Social History Social History   Tobacco Use  . Smoking status: Former Smoker    Packs/day: 5.00    Years: 10.00    Pack years: 50.00    Types: Cigars  . Smokeless tobacco: Never Used  . Tobacco comment: tobacco info given 03/14/17  Substance Use Topics  . Alcohol use: Not Currently    Comment: occasionally  . Drug use: No     Allergies   Patient has no known allergies.   Review of Systems Review of Systems  Constitutional: Negative for fever.  HENT: Negative for sore throat.   Respiratory: Negative for shortness of breath.   Cardiovascular: Negative for chest pain.  Gastrointestinal: Positive for abdominal pain and nausea. Negative for constipation, diarrhea and vomiting.  Genitourinary: Negative for dysuria.  Musculoskeletal: Negative for neck pain.  Skin: Negative for rash.  Neurological: Negative for tingling and numbness.     Physical Exam Updated Vital Signs BP 121/78 (BP Location: Right Arm)   Pulse 68   Temp 99.1 F (37.3 C) (Oral)   Resp 17   Ht 4\' 11"  (1.499 m)   Wt 65.8 kg   LMP 03/05/2018   SpO2 100%   BMI 29.29 kg/m   Physical Exam  Constitutional: She appears well-developed and well-nourished. No distress.  HENT:  Head: Normocephalic and atraumatic.  Eyes: Conjunctivae are normal.  Neck: Neck supple.  Cardiovascular: Normal rate and regular rhythm.  No murmur heard. Pulmonary/Chest: Effort normal and breath sounds normal. No respiratory  distress.  Abdominal: Soft. There is no tenderness. There is no rigidity and no guarding.  Musculoskeletal: She exhibits no edema.       Right shoulder: She exhibits tenderness and pain. She exhibits normal range of motion, no bony tenderness, no swelling, no effusion, no deformity, no laceration and no spasm.       Left shoulder: Normal.       Right elbow: Normal.      Right wrist: Normal.  Neurological: She is alert.  Skin: Skin is warm and dry.  Psychiatric: She has a normal mood and affect.  Nursing note and vitals reviewed.    ED Treatments / Results  Labs (all labs ordered are listed, but only abnormal results are displayed) Labs Reviewed  COMPREHENSIVE METABOLIC PANEL - Abnormal; Notable for the following components:      Result Value   Glucose, Bld 100 (*)    BUN <5 (*)    Alkaline Phosphatase 37 (*)    All other components within normal limits  URINALYSIS, ROUTINE W REFLEX MICROSCOPIC - Abnormal; Notable for the following components:   Color, Urine STRAW (*)    All other components within normal limits  LIPASE, BLOOD  CBC  I-STAT BETA HCG BLOOD, ED ([facility], [facility], [facility] ONLY)    EKG None  Radiology Dg Chest 2 View  Result Date: 03/24/2018 CLINICAL DATA:  Shoulder pain, right chest pain EXAM: CHEST - 2 VIEW COMPARISON:  12/19/2017 FINDINGS: Heart and mediastinal contours are within normal limits. No focal opacities or effusions. No acute bony abnormality. IMPRESSION: No active cardiopulmonary disease. Electronically Signed   By: Rolm Baptise M.D.   On: 03/24/2018 18:10   Dg Shoulder Right  Result Date: 03/24/2018 CLINICAL DATA:  Right shoulder pain. EXAM: RIGHT SHOULDER - 2+ VIEW COMPARISON:  None. FINDINGS: There is no evidence of fracture or dislocation. There is no evidence of arthropathy or other focal bone abnormality. Soft tissues are unremarkable. IMPRESSION: Negative. Electronically Signed   By: Fidela Salisbury M.D.   On: 03/24/2018 18:10     Procedures Procedures (including critical care time)  Medications Ordered in ED Medications - No data to display   Initial Impression / Assessment and Plan / ED Course  I have reviewed the triage vital signs and the nursing notes.  Pertinent labs & imaging results that were available during my care of the patient were reviewed by me and considered in my medical decision making (see chart for details).     Final Clinical Impressions(s) / ED Diagnoses   Final diagnoses:  Acute pain of right shoulder    ED Discharge Orders         Ordered    HYDROcodone-acetaminophen (NORCO/VICODIN) 5-325 MG tablet  Every 6 hours PRN     03/24/18 1915           Hayden Rasmussen, MD 03/25/18 1203

## 2018-03-24 NOTE — ED Notes (Signed)
Patient transported to X-ray 

## 2018-03-27 ENCOUNTER — Ambulatory Visit (INDEPENDENT_AMBULATORY_CARE_PROVIDER_SITE_OTHER): Payer: Self-pay | Admitting: Physician Assistant

## 2018-03-27 ENCOUNTER — Other Ambulatory Visit: Payer: Self-pay

## 2018-03-27 ENCOUNTER — Encounter: Payer: Self-pay | Admitting: Internal Medicine

## 2018-03-27 ENCOUNTER — Emergency Department (HOSPITAL_COMMUNITY)
Admission: EM | Admit: 2018-03-27 | Discharge: 2018-03-27 | Disposition: A | Discharge status: Home / Self Care | Payer: Medicaid Other | Attending: Emergency Medicine | Admitting: Emergency Medicine

## 2018-03-27 ENCOUNTER — Encounter (HOSPITAL_COMMUNITY): Payer: Self-pay

## 2018-03-27 ENCOUNTER — Encounter: Payer: Self-pay | Admitting: Physician Assistant

## 2018-03-27 VITALS — BP 98/60 | HR 76 | Ht 59.0 in | Wt 134.4 lb

## 2018-03-27 DIAGNOSIS — K5900 Constipation, unspecified: Secondary | ICD-10-CM

## 2018-03-27 DIAGNOSIS — Z5321 Procedure and treatment not carried out due to patient leaving prior to being seen by health care provider: Secondary | ICD-10-CM | POA: Insufficient documentation

## 2018-03-27 DIAGNOSIS — R42 Dizziness and giddiness: Secondary | ICD-10-CM | POA: Insufficient documentation

## 2018-03-27 DIAGNOSIS — Z8619 Personal history of other infectious and parasitic diseases: Secondary | ICD-10-CM

## 2018-03-27 DIAGNOSIS — R1013 Epigastric pain: Secondary | ICD-10-CM

## 2018-03-27 LAB — URINALYSIS, ROUTINE W REFLEX MICROSCOPIC
Bilirubin Urine: NEGATIVE
GLUCOSE, UA: NEGATIVE mg/dL
Hgb urine dipstick: NEGATIVE
Ketones, ur: 5 mg/dL — AB
Leukocytes, UA: NEGATIVE
Nitrite: NEGATIVE
PROTEIN: NEGATIVE mg/dL
Specific Gravity, Urine: 1.001 — ABNORMAL LOW (ref 1.005–1.030)
pH: 6 (ref 5.0–8.0)

## 2018-03-27 LAB — BASIC METABOLIC PANEL
ANION GAP: 12 (ref 5–15)
BUN: 6 mg/dL (ref 6–20)
CALCIUM: 9.3 mg/dL (ref 8.9–10.3)
CO2: 23 mmol/L (ref 22–32)
Chloride: 102 mmol/L (ref 98–111)
Creatinine, Ser: 0.9 mg/dL (ref 0.44–1.00)
GFR calc Af Amer: >60 mL/min (ref >60)
GFR calc non Af Amer: >60 mL/min (ref >60)
Glucose, Bld: 103 mg/dL — ABNORMAL HIGH (ref 70–99)
Potassium: 3.3 mmol/L — ABNORMAL LOW (ref 3.5–5.1)
Sodium: 137 mmol/L (ref 135–145)

## 2018-03-27 LAB — CBC
HCT: 41.6 % (ref 36.0–46.0)
Hemoglobin: 13.2 g/dL (ref 12.0–15.0)
MCH: 29.3 pg (ref 26.0–34.0)
MCHC: 31.7 g/dL (ref 30.0–36.0)
MCV: 92.4 fL (ref 80.0–100.0)
NRBC: 0 % (ref 0.0–0.2)
PLATELETS: 292 10*3/uL (ref 150–400)
RBC: 4.5 MIL/uL (ref 3.87–5.11)
RDW: 12.5 % (ref 11.5–15.5)
WBC: 5.6 10*3/uL (ref 4.0–10.5)

## 2018-03-27 LAB — I-STAT BETA HCG BLOOD, ED (MC, WL, AP ONLY)

## 2018-03-27 MED ORDER — LINACLOTIDE 72 MCG PO CAPS: 72.0000 ug | ORAL_CAPSULE | Freq: Every day | ORAL | 0 refills | Status: DC

## 2018-03-27 NOTE — ED Triage Notes (Signed)
Pt here for dizziness that began earlier in the evening.  Pt states she got light headed and laid down.  After laying down she felt like her heart stopped.  She sat back up and got dizzy again.  A&Ox4, ambulatory to triage.  No unilateral weakness.

## 2018-03-27 NOTE — Patient Instructions (Addendum)
If you are age 30 or older, your body mass index should be between 23-30. Your Body mass index is 27.14 kg/m. If this is out of the aforementioned range listed, please consider follow up with your Primary Care Provider.  If you are age 19 or younger, your body mass index should be between 19-25. Your Body mass index is 27.14 kg/m. If this is out of the aformentioned range listed, please consider follow up with your Primary Care Provider.   You have been scheduled for an endoscopy. Please follow written instructions given to you at your visit today. If you use inhalers (even only as needed), please bring them with you on the day of your procedure. Your physician has requested that you go to www.startemmi.com and enter the access code given to you at your visit today. This web site gives a general overview about your procedure. However, you should still follow specific instructions given to you by our office regarding your preparation for the procedure.  We have sent the following medications to your pharmacy for you to pick up at your convenience: Linzess  Thank you for choosing me and [facility].   Ellouise Newer, PA-C

## 2018-03-27 NOTE — Progress Notes (Signed)
Chief Complaint: Epigastric pain  HPI:    Natalie Thornton is a 32 year old AA female with a past medical history as listed below, known to Dr. Henrene Pastor, who was referred to me by Natalie Pier, MD for a complaint of epigastric pain.      03/15/2017 EGD with mild Schatzki's ring and otherwise normal.  Biopsies were positive for H. pylori which was treated.    06/19/2017 office visit with Dr. Henrene Pastor to evaluate chronic abdominal pain.  It was discussed she had been evaluated on multiple occasions for the same with extensive work-up including colonoscopy, upper endoscopy, CT imaging, MRI imaging, HIDA scan and multiple labs.  Work-up has been entirely negative or normal except for the presence of H. pylori with a normal-appearing stomach which was positive.  Subsequently treated.  Apparently she was frustrated with her ongoing complaints of pain.  At that time, her chronic functional abdominal pain was discussed and literature was reviewed.  She was advised to talk to her PCP regarding further assessment of her back and possibly specialty referral, also to follow with GYN regarding pelvic pain.  Discussed she may want to talk about SSRI with her PCP.     02/22/2018 patient had an H. pylori breath test which was positive.  She was restarted on Clarithromycin, Amoxicillin and Omeprazole by her PCP.    03/24/2018 ER visit for abdominal pain.  This was thought more related to shoulder pain and the patient was given some Hydrocodone.    Per review of chart patiently apparently presented at the ER this morning at 2:58 in the morning with dizziness.  Apparently reported she got lightheaded and laid down and after laying down she felt like her heart stopped.  She did not stay to be seen.  Labs from this morning show potassium low at 3.3.  Urinalysis normal.  CBC normal.     Today, patient tells me she continues with her chronic complaints of abdominal pain and a general feeling of "sickness", but tells me that she was  re-diagnosed with H. pylori a month ago, and "this round is way different than the first".  She is constantly nauseous, which is only minimally helped by using Zofran as needed.  Also has a large decrease in appetite and increased severity of epigastric discomfort which she rates as a 9-10/10.  Patient tells me she has been on the antibiotics and Prilosec over the past few weeks, but apparently has been using the antibiotics separately, she finished her Clarithromycin and has now started on her Amoxicillin because she was unable to handle taking these both together due to stomach discomfort.  Does describe occasional episodes of vomiting.  Tried Bentyl in the past with no help.  Patient tells me "nothing has ever helped my problems".  Associated symptoms include a headache and some dizziness recently.    Also describes a change towards constipation over the past few months, apparently has been using suppositories every 2 to 3 days in order to achieve a bowel movement.  Has tried MiraLAX in the past but now cannot stomach this due to the nausea.  Tells me this never really helped anyways.    Denies fever, chills, weight loss or symptoms that awaken her from sleep.  Past Medical History:  Diagnosis Date  . Anxiety   . Arthritis   . DDD (degenerative disc disease)   . Degenerative disc disease, cervical   . Disc disease, degenerative, cervical   . H pylori ulcer   .  Pilonidal cyst     Past Surgical History:  Procedure Laterality Date  . COLONOSCOPY      Current Outpatient Medications  Medication Sig Dispense Refill  . amoxicillin (AMOXIL) 500 MG capsule Take 2 capsules (1,000 mg total) by mouth 2 (two) times daily. 40 capsule 0  . clarithromycin (BIAXIN) 500 MG tablet Take 1 tablet (500 mg total) by mouth 2 (two) times daily. 20 tablet 0  . dicyclomine (BENTYL) 20 MG tablet Take 1 tablet (20 mg total) by mouth every 8 (eight) hours as needed for spasms (Abdominal cramping). 15 tablet 0  .  HYDROcodone-acetaminophen (NORCO/VICODIN) 5-325 MG tablet Take 1-2 tablets by mouth every 6 (six) hours as needed for severe pain. 15 tablet 0  . indomethacin (INDOCIN) 25 MG capsule Take 1 capsule (25 mg total) by mouth daily as needed. Take with food.  Do not take with ibuprofen or other over the counter pain meds 20 capsule 0  . loperamide (IMODIUM) 2 MG capsule Take 1 capsule (2 mg total) by mouth 4 (four) times daily as needed for diarrhea or loose stools. 12 capsule 0  . Multiple Vitamins-Minerals (ADULT GUMMY PO) Take 1 tablet by mouth daily.    Marland Kitchen omeprazole (PRILOSEC) 20 MG capsule Take 1 capsule (20 mg total) by mouth 2 (two) times daily before a meal. X 2 weeks then once daily 60 capsule 1  . ondansetron (ZOFRAN ODT) 4 MG disintegrating tablet Take 1 tablet (4 mg total) by mouth every 6 (six) hours as needed. 20 tablet 0  . terconazole (TERAZOL 7) 0.4 % vaginal cream Place 1 applicator vaginally at bedtime. 45 g 0  . triamcinolone ointment (KENALOG) 0.5 % Apply 1 application topically 3 (three) times daily as needed. For itching 30 g 0  . Vitamin D, Ergocalciferol, (DRISDOL) 50000 units CAPS capsule Take 1 capsule (50,000 Units total) by mouth every 7 (seven) days. 16 capsule 0   No current facility-administered medications for this visit.     Allergies as of 03/27/2018  . (No Known Allergies)    Family History  Problem Relation Age of Onset  . AAA (abdominal aortic aneurysm) Maternal Grandmother   . Leukemia Maternal Grandmother   . Breast cancer Maternal Grandmother   . Head & neck cancer Paternal Grandmother        Back cancer  . Lymphoma Paternal Grandmother   . Cirrhosis Maternal Uncle   . Breast cancer Other   . Colon cancer Neg Hx   . Esophageal cancer Neg Hx   . Rectal cancer Neg Hx   . Stomach cancer Neg Hx     Social History   Socioeconomic History  . Marital status: Single    Spouse name: Not on file  . Number of children: 2  . Years of education: Not on  file  . Highest education level: Not on file  Occupational History  . Occupation: [employer]  . Financial resource strain: Not on file  . Food insecurity:    Worry: Not on file    Inability: Not on file  . Transportation needs:    Medical: Not on file    Non-medical: Not on file  Tobacco Use  . Smoking status: Former Smoker    Packs/day: 5.00    Years: 10.00    Pack years: 50.00    Types: Cigars  . Smokeless tobacco: Never Used  . Tobacco comment: tobacco info given 03/14/17  Substance and Sexual Activity  . Alcohol use:  Not Currently    Comment: occasionally  . Drug use: No  . Sexual activity: Not on file  Lifestyle  . Physical activity:    Days per week: Not on file    Minutes per session: Not on file  . Stress: Not on file  Relationships  . Social connections:    Talks on phone: Not on file    Gets together: Not on file    Attends religious service: Not on file    Active member of club or organization: Not on file    Attends meetings of clubs or organizations: Not on file    Relationship status: Not on file  . Intimate partner violence:    Fear of current or ex partner: Not on file    Emotionally abused: Not on file    Physically abused: Not on file    Forced sexual activity: Not on file  Other Topics Concern  . Not on file  Social History Narrative  . Not on file    Review of Systems:    Constitutional: No weight loss, fever or chills Cardiovascular: No chest pain Respiratory: No SOB Gastrointestinal: See HPI and otherwise negative   Physical Exam:  Vital signs: BP 98/60 (BP Location: Left Arm, Patient Position: Sitting, Cuff Size: Normal)   Pulse 76   Ht 4\' 11"  (1.499 m) Comment: height measured without shoes  Wt 134 lb 6 oz (61 kg)   LMP 03/06/2018   BMI 27.14 kg/m   Constitutional:  AA female appears to be in NAD, Well developed, Well nourished, alert and cooperative Respiratory: Respirations even and unlabored. Lungs clear to  auscultation bilaterally.   No wheezes, crackles, or rhonchi.  Cardiovascular: Normal S1, S2. No MRG. Regular rate and rhythm. No peripheral edema, cyanosis or pallor.  Gastrointestinal:  Soft, mild epigastric ttp, nontender. No rebound or guarding. Normal bowel sounds. No appreciable masses or hepatomegaly. Rectal:  Not performed.  Psychiatric: Depressed mood, Demonstrates good judgement and reason without abnormal affect or behaviors.  MOST RECENT LABS AND IMAGING: CBC    Component Value Date/Time   WBC 5.6 03/27/2018 0257   RBC 4.50 03/27/2018 0257   HGB 13.2 03/27/2018 0257   HGB 13.3 02/22/2018 1057   HCT 41.6 03/27/2018 0257   HCT 39.7 02/22/2018 1057   PLT 292 03/27/2018 0257   PLT 265 02/22/2018 1057   MCV 92.4 03/27/2018 0257   MCV 90 02/22/2018 1057   MCH 29.3 03/27/2018 0257   MCHC 31.7 03/27/2018 0257   RDW 12.5 03/27/2018 0257   RDW 11.6 (L) 02/22/2018 1057   LYMPHSABS 1.7 02/22/2018 1057   MONOABS 0.4 12/19/2017 1730   EOSABS 0.1 02/22/2018 1057   BASOSABS 0.0 02/22/2018 1057    CMP     Component Value Date/Time   NA 137 03/27/2018 0257   NA 139 02/22/2018 1057   K 3.3 (L) 03/27/2018 0257   CL 102 03/27/2018 0257   CO2 23 03/27/2018 0257   GLUCOSE 103 (H) 03/27/2018 0257   BUN 6 03/27/2018 0257   BUN 6 02/22/2018 1057   CREATININE 0.90 03/27/2018 0257   CALCIUM 9.3 03/27/2018 0257   PROT 7.0 03/24/2018 1521   PROT 6.5 02/22/2018 1057   ALBUMIN 4.2 03/24/2018 1521   ALBUMIN 4.2 02/22/2018 1057   AST 23 03/24/2018 1521   ALT 15 03/24/2018 1521   ALKPHOS 37 (L) 03/24/2018 1521   BILITOT 0.4 03/24/2018 1521   BILITOT 0.4 02/22/2018 1057   GFRNONAA >60 03/27/2018  Muir >60 03/27/2018 0257    Assessment: 1.  Chronic epigastric pain: Believed mostly functional in the past, likely this still holds true but the patient did have a recent test which was positive for H. pylori and an increase in nausea as well as pain; consider PUD +/- functional  dyspepsia +/- h.pylori 2.  Positive H. pylori: In September of this year, restarted on triple therapy with Clarithromycin and Amoxicillin as well as Omeprazole, patient has been taking these separately so I am not sure how well these have been treating her H. pylori 3.  Constipation: New over the past few months, a bowel movement every 2 to 3 days with use of a suppository, MiraLAX tried in the past with no help; consider relation to functional problems including IBS  Plan: 1.  Due to patient's increase in severity of problems and recently abnormal H. pylori testing and incomplete treatment, recommend repeat EGD at this time for further evaluation.  Did discuss risks, benefits, limitations and alternatives and patient agrees to proceed.  Told her I would like to schedule her at least 4-6 weeks from now to allow her time to finish antibiotics. 2.  Did discuss that likely her epigastric pain still has a functional component, though the H. pylori makes this somewhat confusing 3.  Started patient on Linzess 72 mcg daily for constipation 4.  Patient to follow in clinic per recommendations from Dr. Henrene Pastor after time of procedure.  Ellouise Newer, PA-C [facility] 03/27/2018, 11:15 AM  Cc: Natalie Pier, MD

## 2018-03-27 NOTE — Progress Notes (Signed)
Assessment and plan reviewed 

## 2018-03-27 NOTE — ED Notes (Signed)
Pt.was called x3 no answer .called the 4th time no answer.

## 2018-04-03 NOTE — Telephone Encounter (Signed)
Natalie Thornton see the pt message, can she be moved to a sooner EGD?

## 2018-04-06 ENCOUNTER — Encounter: Payer: Self-pay | Admitting: Internal Medicine

## 2018-04-06 ENCOUNTER — Ambulatory Visit (AMBULATORY_SURGERY_CENTER): Payer: Self-pay | Admitting: Internal Medicine

## 2018-04-06 VITALS — BP 116/69 | HR 65 | Temp 99.6°F | Resp 14 | Ht 59.0 in | Wt 134.0 lb

## 2018-04-06 DIAGNOSIS — R1013 Epigastric pain: Secondary | ICD-10-CM

## 2018-04-06 MED ORDER — SODIUM CHLORIDE 0.9 % IV SOLN: 500.0000 mL | Freq: Once | INTRAVENOUS | Status: DC

## 2018-04-06 NOTE — Op Note (Signed)
[facility] Patient Name: Natalie Thornton Procedure Date: 04/06/2018 10:32 AM MRN: [id number] Endoscopist: Docia Chuck. Henrene Pastor , MD Age: 32 Referring MD:  Date of Birth: January 07, 1986 Gender: Female Account #: [id number] Procedure:                Upper GI endoscopy with biopsies Indications:              Epigastric abdominal pain, Functional Dyspepsia..                            Treated for H. pylori Medicines:                Monitored Anesthesia Care Procedure:                Pre-Anesthesia Assessment:                           - Prior to the procedure, a History and Physical                            was performed, and patient medications and                            allergies were reviewed. The patient's tolerance of                            previous anesthesia was also reviewed. The risks                            and benefits of the procedure and the sedation                            options and risks were discussed with the patient.                            All questions were answered, and informed consent                            was obtained. Prior Anticoagulants: The patient has                            taken no previous anticoagulant or antiplatelet                            agents. ASA Grade Assessment: I - A normal, healthy                            patient. After reviewing the risks and benefits,                            the patient was deemed in satisfactory condition to                            undergo the procedure.  After obtaining informed consent, the endoscope was                            passed under direct vision. Throughout the                            procedure, the patient's blood pressure, pulse, and                            oxygen saturations were monitored continuously. The                            Endoscope was introduced through the mouth, and                            advanced to the second part of  duodenum. The upper                            GI endoscopy was accomplished without difficulty.                            The patient tolerated the procedure well. Scope In: Scope Out: Findings:                 The esophagus was normal.                           The stomach was normal vital hernia. Biopsies were                            taken with a cold forceps for Helicobacter pylori                            testing using CLOtest.                           The examined duodenum was normal.                           The cardia and gastric fundus were normal on                            retroflexion. Complications:            No immediate complications. Estimated Blood Loss:     Estimated blood loss: none. Impression:               1. Essentially normal EGD                           2. Status post biopsies to assess for eradication                            of Helicobacter pylori                           3. Chronic functional abdominal  pain. Recommendation:           1. Continue current medications                           2. Follow-up CLO test                           3. Return to the care of Dr. Wynetta Emery. The patient                            had an inadequate trial of SSRI (took for a short                            period of time without clinical follow-up) for her                            chronic functional abdominal complaints. She is                            willing to be retreated with close follow-up. I do                            believe that underlying issues with anxiety and                            stress are contributing to her discomfort. If these                            are adequately and successfully treated, I feel                            that her overall sense of well-being will improve. Docia Chuck. Henrene Pastor, MD 04/06/2018 10:54:23 AM This report has been signed electronically.

## 2018-04-06 NOTE — Progress Notes (Signed)
I

## 2018-04-06 NOTE — Patient Instructions (Signed)
Continue present medications Return to care of Dr. Wynetta Emery.     YOU HAD AN ENDOSCOPIC PROCEDURE TODAY AT [facility]:   Refer to the procedure report that was given to you for any specific questions about what was found during the examination.  If the procedure report does not answer your questions, please call your gastroenterologist to clarify.  If you requested that your care partner not be given the details of your procedure findings, then the procedure report has been included in a sealed envelope for you to review at your convenience later.  YOU SHOULD EXPECT: Some feelings of bloating in the abdomen. Passage of more gas than usual.  Walking can help get rid of the air that was put into your GI tract during the procedure and reduce the bloating. If you had a lower endoscopy (such as a colonoscopy or flexible sigmoidoscopy) you may notice spotting of blood in your stool or on the toilet paper. If you underwent a bowel prep for your procedure, you may not have a normal bowel movement for a few days.  Please Note:  You might notice some irritation and congestion in your nose or some drainage.  This is from the oxygen used during your procedure.  There is no need for concern and it should clear up in a day or so.  SYMPTOMS TO REPORT IMMEDIATELY:     Following upper endoscopy (EGD)  Vomiting of blood or coffee ground material  New chest pain or pain under the shoulder blades  Painful or persistently difficult swallowing  New shortness of breath  Fever of 100F or higher  Black, tarry-looking stools  For urgent or emergent issues, a gastroenterologist can be reached at any hour by calling [phone].   DIET:  We do recommend a small meal at first, but then you may proceed to your regular diet.  Drink plenty of fluids but you should avoid alcoholic beverages for 24 hours.  ACTIVITY:  You should plan to take it easy for the rest of today and you should NOT DRIVE or  use heavy machinery until tomorrow (because of the sedation medicines used during the test).    FOLLOW UP: Our staff will call the number listed on your records the next business day following your procedure to check on you and address any questions or concerns that you may have regarding the information given to you following your procedure. If we do not reach you, we will leave a message.  However, if you are feeling well and you are not experiencing any problems, there is no need to return our call.  We will assume that you have returned to your regular daily activities without incident.  If any biopsies were taken you will be contacted by phone or by letter within the next 1-3 weeks.  Please call us at [phone] if you have not heard about the biopsies in 3 weeks.    SIGNATURES/CONFIDENTIALITY: You and/or your care partner have signed paperwork which will be entered into your electronic medical record.  These signatures attest to the fact that that the information above on your After Visit Summary has been reviewed and is understood.  Full responsibility of the confidentiality of this discharge information lies with you and/or your care-partner.

## 2018-04-06 NOTE — Progress Notes (Signed)
Called to room to assist during endoscopic procedure.  Patient ID and intended procedure confirmed with present staff. Received instructions for my participation in the procedure from the performing physician.  

## 2018-04-06 NOTE — Progress Notes (Signed)
PT taken to PACU. Monitors in place. VSS. Report given to RN. 

## 2018-04-09 ENCOUNTER — Telehealth: Payer: Self-pay | Admitting: Internal Medicine

## 2018-04-09 ENCOUNTER — Telehealth: Payer: Self-pay

## 2018-04-09 LAB — HELICOBACTER PYLORI SCREEN-BIOPSY: UREASE: NEGATIVE

## 2018-04-09 NOTE — Telephone Encounter (Signed)
  Follow up Call-  Call back number 04/06/2018 03/15/2017 08/12/2016  Post procedure Call Back phone  # [phone]  Permission to leave phone message Yes Yes Yes  Some recent data might be hidden     Patient questions:  Do you have a fever, pain , or abdominal swelling? No. Pain Score  0 *  Have you tolerated food without any problems? Yes.    Have you been able to return to your normal activities? Yes.    Do you have any questions about your discharge instructions: Diet   No. Medications  No. Follow up visit  No.  Do you have questions or concerns about your Care? No.  Actions: * If pain score is 4 or above: No action needed, pain <4.

## 2018-04-09 NOTE — Telephone Encounter (Signed)
See result note.  

## 2018-04-09 NOTE — Telephone Encounter (Signed)
Pt wants to know what could be causing her sxs if H-pylori test was negative.

## 2018-04-11 ENCOUNTER — Ambulatory Visit: Payer: Self-pay | Admitting: Internal Medicine

## 2018-04-24 ENCOUNTER — Ambulatory Visit: Payer: Medicaid Other | Admitting: Internal Medicine

## 2018-04-30 ENCOUNTER — Encounter: Payer: Self-pay | Admitting: Internal Medicine

## 2018-05-13 ENCOUNTER — Emergency Department (HOSPITAL_COMMUNITY)
Admission: EM | Admit: 2018-05-13 | Discharge: 2018-05-13 | Disposition: A | Discharge status: Home / Self Care | Payer: Medicaid Other | Attending: Emergency Medicine | Admitting: Emergency Medicine

## 2018-05-13 ENCOUNTER — Emergency Department (HOSPITAL_COMMUNITY): Payer: Medicaid Other

## 2018-05-13 ENCOUNTER — Encounter (HOSPITAL_COMMUNITY): Payer: Self-pay

## 2018-05-13 DIAGNOSIS — Z79899 Other long term (current) drug therapy: Secondary | ICD-10-CM | POA: Insufficient documentation

## 2018-05-13 DIAGNOSIS — Z87891 Personal history of nicotine dependence: Secondary | ICD-10-CM | POA: Insufficient documentation

## 2018-05-13 DIAGNOSIS — R0789 Other chest pain: Secondary | ICD-10-CM | POA: Insufficient documentation

## 2018-05-13 LAB — CBC WITH DIFFERENTIAL/PLATELET
Abs Immature Granulocytes: 0.01 10*3/uL (ref 0.00–0.07)
BASOS ABS: 0 10*3/uL (ref 0.0–0.1)
Basophils Relative: 0 %
EOS PCT: 1 %
Eosinophils Absolute: 0 10*3/uL (ref 0.0–0.5)
HEMATOCRIT: 38.5 % (ref 36.0–46.0)
HEMOGLOBIN: 12.3 g/dL (ref 12.0–15.0)
Immature Granulocytes: 0 %
Lymphocytes Relative: 35 %
Lymphs Abs: 1.7 10*3/uL (ref 0.7–4.0)
MCH: 29.4 pg (ref 26.0–34.0)
MCHC: 31.9 g/dL (ref 30.0–36.0)
MCV: 92.1 fL (ref 80.0–100.0)
MONO ABS: 0.4 10*3/uL (ref 0.1–1.0)
Monocytes Relative: 9 %
NRBC: 0 % (ref 0.0–0.2)
Neutro Abs: 2.6 10*3/uL (ref 1.7–7.7)
Neutrophils Relative %: 55 %
Platelets: 211 10*3/uL (ref 150–400)
RBC: 4.18 MIL/uL (ref 3.87–5.11)
RDW: 12.3 % (ref 11.5–15.5)
WBC: 4.7 10*3/uL (ref 4.0–10.5)

## 2018-05-13 LAB — BASIC METABOLIC PANEL
ANION GAP: 4 — AB (ref 5–15)
BUN: 6 mg/dL (ref 6–20)
CO2: 23 mmol/L (ref 22–32)
Calcium: 8.7 mg/dL — ABNORMAL LOW (ref 8.9–10.3)
Chloride: 106 mmol/L (ref 98–111)
Creatinine, Ser: 0.78 mg/dL (ref 0.44–1.00)
GFR calc Af Amer: >60 mL/min (ref >60)
GLUCOSE: 85 mg/dL (ref 70–99)
POTASSIUM: 3.5 mmol/L (ref 3.5–5.1)
Sodium: 133 mmol/L — ABNORMAL LOW (ref 135–145)

## 2018-05-13 LAB — RAPID URINE DRUG SCREEN, HOSP PERFORMED
AMPHETAMINES: NOT DETECTED
BARBITURATES: NOT DETECTED
BENZODIAZEPINES: NOT DETECTED
Cocaine: NOT DETECTED
Opiates: NOT DETECTED
Tetrahydrocannabinol: NOT DETECTED

## 2018-05-13 LAB — I-STAT TROPONIN, ED: Troponin i, poc: 0 ng/mL (ref 0.00–0.08)

## 2018-05-13 LAB — I-STAT BETA HCG BLOOD, ED (MC, WL, AP ONLY): I-stat hCG, quantitative: <5 m[IU]/mL (ref <5)

## 2018-05-13 MED ORDER — IBUPROFEN 400 MG PO TABS
600.0000 mg | ORAL_TABLET | Freq: Once | ORAL | Status: AC
  Administered 2018-05-13: 600 mg via ORAL
  Filled 2018-05-13: qty 1

## 2018-05-13 NOTE — Discharge Instructions (Signed)
Please return for any problem.  Follow-up with your regular care provider as instructed. °

## 2018-05-13 NOTE — ED Triage Notes (Signed)
Onset 3 days chest pain, cough and cold symptoms.  NAD.

## 2018-05-13 NOTE — ED Notes (Signed)
Patient transported to X-ray 

## 2018-05-13 NOTE — ED Provider Notes (Signed)
[facility] EMERGENCY DEPARTMENT Provider Note   CSN: [id number] Arrival date & time: 05/13/18  1533     History   Chief Complaint No chief complaint on file.   HPI Natalie Thornton is a 32 y.o. female.  32 year old female with prior medical history as detailed below presents for evaluation of chest discomfort.  Patient reports 4 days of constant chest discomfort.  She reports left-sided anterior chest wall discomfort that is worse at night when she is lying on her left side.  She is found that she has had sleep on the right side.  She denies specific inciting trauma or event.  She is taken nothing at home for her symptoms.  She reports today from her workplace for evaluation.  She denies prior history of ACS or or known cardiac disease.  Reports a dull pain at the left costochondral margin.  This pain is worse with lying on the left.  It is worse with pressure applied to the left chest wall.  The history is provided by the patient and medical records.  Chest Pain   This is a new problem. The current episode started more than 2 days ago. The problem occurs constantly. The problem has not changed since onset.Associated with: laying on left side  Pain location: left anterior chest wall. The pain is mild. The quality of the pain is described as dull. The pain does not radiate. Duration of episode(s) is 4 days. The symptoms are aggravated by certain positions. Pertinent negatives include no dizziness, no nausea, no near-syncope, no numbness, no orthopnea, no palpitations, no shortness of breath, no syncope, no vomiting and no weakness. She has tried nothing for the symptoms.    Past Medical History:  Diagnosis Date  . Anxiety   . Arthritis   . DDD (degenerative disc disease)   . Degenerative disc disease, cervical   . Disc disease, degenerative, cervical   . H pylori ulcer   . H/O degenerative disc disease 2013  . Pilonidal cyst     Patient Active Problem List   Diagnosis Date Noted  . Diabetes mellitus screening 01/06/2017  . Tobacco abuse 01/06/2017  . Anxiety and depression 01/06/2017  . Dyspnea on exertion 10/20/2016  . Cigarette smoker 10/20/2016  . Solitary pulmonary nodule 10/20/2016  . Acute contact dermatitis 10/20/2016  . Pilonidal sinus 07/05/2011    Past Surgical History:  Procedure Laterality Date  . COLONOSCOPY       OB History   None      Home Medications    Prior to Admission medications   Medication Sig Start Date End Date Taking? Authorizing Provider  omeprazole (PRILOSEC) 20 MG capsule Take 1 capsule (20 mg total) by mouth 2 (two) times daily before a meal. X 2 weeks then once daily 02/25/18   Argentina Donovan, PA-C  ondansetron (ZOFRAN ODT) 4 MG disintegrating tablet Take 1 tablet (4 mg total) by mouth every 6 (six) hours as needed. 03/13/18   Ward, Delice Bison, DO    Family History Family History  Problem Relation Age of Onset  . AAA (abdominal aortic aneurysm) Maternal Grandmother   . Leukemia Maternal Grandmother   . Breast cancer Maternal Grandmother   . Head & neck cancer Paternal Grandmother        Back cancer  . Lymphoma Paternal Grandmother   . Cirrhosis Maternal Uncle   . Breast cancer Other   . Colon cancer Neg Hx   . Esophageal cancer Neg Hx   .  Rectal cancer Neg Hx   . Stomach cancer Neg Hx     Social History Social History   Tobacco Use  . Smoking status: Former Smoker    Packs/day: 5.00    Years: 10.00    Pack years: 50.00    Types: Cigars  . Smokeless tobacco: Never Used  . Tobacco comment: tobacco info given 03/14/17  Substance Use Topics  . Alcohol use: Not Currently    Comment: occasionally  . Drug use: No     Allergies   Patient has no known allergies.   Review of Systems Review of Systems  Respiratory: Negative for shortness of breath.   Cardiovascular: Positive for chest pain. Negative for palpitations, orthopnea, syncope and near-syncope.  Gastrointestinal:  Negative for nausea and vomiting.  Neurological: Negative for dizziness, weakness and numbness.  All other systems reviewed and are negative.    Physical Exam Updated Vital Signs BP 125/77 (BP Location: Right Arm)   Pulse 69   Temp 98.3 F (36.8 C) (Oral)   Resp 15   SpO2 100%   Physical Exam  Constitutional: She is oriented to person, place, and time. She appears well-developed and well-nourished. No distress.  HENT:  Head: Normocephalic and atraumatic.  Mouth/Throat: Oropharynx is clear and moist.  Eyes: Pupils are equal, round, and reactive to light. Conjunctivae and EOM are normal.  Neck: Normal range of motion. Neck supple.  Cardiovascular: Normal rate, regular rhythm and normal heart sounds.  Mild discomfort to the left anterior chest wall overlying the left costochondral margin.  Palpation of this area reproduces the patient's reported pain.  Pulmonary/Chest: Effort normal and breath sounds normal. No respiratory distress.  Abdominal: Soft. She exhibits no distension. There is no tenderness.  Musculoskeletal: Normal range of motion. She exhibits no edema or deformity.  Neurological: She is alert and oriented to person, place, and time.  Skin: Skin is warm and dry.  Psychiatric: She has a normal mood and affect.  Nursing note and vitals reviewed.    ED Treatments / Results  Labs (all labs ordered are listed, but only abnormal results are displayed) Labs Reviewed  BASIC METABOLIC PANEL - Abnormal; Notable for the following components:      Result Value   Sodium 133 (*)    Calcium 8.7 (*)    Anion gap 4 (*)    All other components within normal limits  RAPID URINE DRUG SCREEN, HOSP PERFORMED  CBC WITH DIFFERENTIAL/PLATELET  I-STAT TROPONIN, ED  I-STAT BETA HCG BLOOD, ED ([facility], [facility], [facility] ONLY)    EKG EKG Interpretation  Date/Time:  Sunday May 13 2018 15:47:29 EST Ventricular Rate:  70 PR Interval:    QRS Duration: 76 QT Interval:  399 QTC  Calculation: 431 R Axis:   36 Text Interpretation:  Sinus rhythm Confirmed by Dene Gentry [phone]) on 05/13/2018 3:57:21 PM   Radiology Dg Chest 2 View  Result Date: 05/13/2018 CLINICAL DATA:  Pt c/o of chest and sob for the past 4 days. Pt denies any pulmonary or cardiac problems. EXAM: CHEST - 2 VIEW COMPARISON:  03/24/2018 FINDINGS: The heart size and mediastinal contours are within normal limits. Both lungs are clear. No pleural effusion or pneumothorax. The visualized skeletal structures are unremarkable. IMPRESSION: No active cardiopulmonary disease. Electronically Signed   By: Lajean Manes M.D.   On: 05/13/2018 16:54    Procedures Procedures (including critical care time)  Medications Ordered in ED Medications  ibuprofen (ADVIL,MOTRIN) tablet 600 mg (has no administration in time  range)     Initial Impression / Assessment and Plan / ED Course  I have reviewed the triage vital signs and the nursing notes.  Pertinent labs & imaging results that were available during my care of the patient were reviewed by me and considered in my medical decision making (see chart for details).     MDM  Screen complete  Is presenting for evaluation of atypical chest discomfort.  This chest discomfort has been present for at least 4 days.  Her EKG is without evidence of acute ischemia.  Her troponin is negative.  Her chest x-ray is without abnormality.  Other screening labs are also without significant pathology. Patient's heart score is 0.   Patient feels improved following her ED evaluation.  She does understand the need for close follow-up.     Final Clinical Impressions(s) / ED Diagnoses   Final diagnoses:  Chest wall pain    ED Discharge Orders    None       Valarie Merino, MD 05/13/18 [phone]

## 2018-05-22 ENCOUNTER — Encounter: Payer: Self-pay | Admitting: Internal Medicine

## 2018-05-22 ENCOUNTER — Ambulatory Visit: Payer: Self-pay | Attending: Internal Medicine | Admitting: Internal Medicine

## 2018-05-22 VITALS — BP 126/84 | HR 85 | Temp 99.4°F | Resp 16 | Wt 133.6 lb

## 2018-05-22 DIAGNOSIS — F419 Anxiety disorder, unspecified: Secondary | ICD-10-CM | POA: Insufficient documentation

## 2018-05-22 DIAGNOSIS — F1010 Alcohol abuse, uncomplicated: Secondary | ICD-10-CM | POA: Insufficient documentation

## 2018-05-22 DIAGNOSIS — M5134 Other intervertebral disc degeneration, thoracic region: Secondary | ICD-10-CM | POA: Insufficient documentation

## 2018-05-22 DIAGNOSIS — K30 Functional dyspepsia: Secondary | ICD-10-CM | POA: Insufficient documentation

## 2018-05-22 DIAGNOSIS — M5136 Other intervertebral disc degeneration, lumbar region: Secondary | ICD-10-CM | POA: Insufficient documentation

## 2018-05-22 DIAGNOSIS — F5105 Insomnia due to other mental disorder: Secondary | ICD-10-CM | POA: Insufficient documentation

## 2018-05-22 DIAGNOSIS — M25511 Pain in right shoulder: Secondary | ICD-10-CM | POA: Insufficient documentation

## 2018-05-22 DIAGNOSIS — Z87891 Personal history of nicotine dependence: Secondary | ICD-10-CM | POA: Insufficient documentation

## 2018-05-22 DIAGNOSIS — Z79899 Other long term (current) drug therapy: Secondary | ICD-10-CM | POA: Insufficient documentation

## 2018-05-22 DIAGNOSIS — F99 Mental disorder, not otherwise specified: Secondary | ICD-10-CM

## 2018-05-22 DIAGNOSIS — F329 Major depressive disorder, single episode, unspecified: Secondary | ICD-10-CM | POA: Insufficient documentation

## 2018-05-22 DIAGNOSIS — F418 Other specified anxiety disorders: Secondary | ICD-10-CM

## 2018-05-22 MED ORDER — BUSPIRONE HCL 5 MG PO TABS: 5.0000 mg | ORAL_TABLET | Freq: Three times a day (TID) | ORAL | 1 refills | Status: DC

## 2018-05-22 NOTE — Progress Notes (Signed)
Patient ID: Natalie Thornton, female    DOB: 08/25/1985  MRN: [id number]  CC: Follow-up   Subjective: Natalie Thornton is a 32 y.o. female who presents for chronic ds management Her concerns today include:  Pt with hx of anxiety/depression, LBP with DDD in lumbar and thoracic spine, tob dep, functional dyspepsia, hx of pos H.pylori in past  Patient was seen in the ED 05/13/2018 with complaints of left-sided chest wall pain x4 days.  Work-up included EKG without evidence of acute ischemia, negative troponin and normal chest x-ray.    Today her main complaint is of anxiety that is usually worse at nights.  When she tries to fall asleep, she gets to worrying that something bad will happen then she begins to feel her heart racing.  Worried that heart may stop beating.  The worrying interferes with her ability to sleep.  She usually gets in bed around 10-11 PM and turns off all lights.  She sometimes try to listen to relaxation music on YouTube or has her partner rub her back which she finds soothing.  She drinks 1-2 shots at night most nights.  C/o swelling R shoulder back in October for which she was seen in the emergency room.  No initiating factors at the time.  X-ray of the shoulder was negative.[name] again this past Friday and lasted about 2 days.  She noted pain with movement at that time.  Again she did not identify any initiating factors.  Symptoms again have since resolved.    Patient Active Problem List   Diagnosis Date Noted  . Diabetes mellitus screening 01/06/2017  . Tobacco abuse 01/06/2017  . Anxiety and depression 01/06/2017  . Dyspnea on exertion 10/20/2016  . Cigarette smoker 10/20/2016  . Solitary pulmonary nodule 10/20/2016  . Acute contact dermatitis 10/20/2016  . Pilonidal sinus 07/05/2011     Current Outpatient Medications on File Prior to Visit  Medication Sig Dispense Refill  . omeprazole (PRILOSEC) 20 MG capsule Take 1 capsule (20 mg total) by mouth 2 (two)  times daily before a meal. X 2 weeks then once daily 60 capsule 1  . ondansetron (ZOFRAN ODT) 4 MG disintegrating tablet Take 1 tablet (4 mg total) by mouth every 6 (six) hours as needed. 20 tablet 0   No current facility-administered medications on file prior to visit.     No Known Allergies  Social History   Socioeconomic History  . Marital status: Single    Spouse name: Not on file  . Number of children: 2  . Years of education: Not on file  . Highest education level: Not on file  Occupational History  . Occupation: [employer]  . Financial resource strain: Not on file  . Food insecurity:    Worry: Not on file    Inability: Not on file  . Transportation needs:    Medical: Not on file    Non-medical: Not on file  Tobacco Use  . Smoking status: Former Smoker    Packs/day: 5.00    Years: 10.00    Pack years: 50.00    Types: Cigars  . Smokeless tobacco: Never Used  . Tobacco comment: tobacco info given 03/14/17  Substance and Sexual Activity  . Alcohol use: Not Currently    Comment: occasionally  . Drug use: No  . Sexual activity: Not on file  Lifestyle  . Physical activity:    Days per week: Not on file  Minutes per session: Not on file  . Stress: Not on file  Relationships  . Social connections:    Talks on phone: Not on file    Gets together: Not on file    Attends religious service: Not on file    Active member of club or organization: Not on file    Attends meetings of clubs or organizations: Not on file    Relationship status: Not on file  . Intimate partner violence:    Fear of current or ex partner: Not on file    Emotionally abused: Not on file    Physically abused: Not on file    Forced sexual activity: Not on file  Other Topics Concern  . Not on file  Social History Narrative  . Not on file    Family History  Problem Relation Age of Onset  . AAA (abdominal aortic aneurysm) Maternal Grandmother   . Leukemia Maternal  Grandmother   . Breast cancer Maternal Grandmother   . Head & neck cancer Paternal Grandmother        Back cancer  . Lymphoma Paternal Grandmother   . Cirrhosis Maternal Uncle   . Breast cancer Other   . Colon cancer Neg Hx   . Esophageal cancer Neg Hx   . Rectal cancer Neg Hx   . Stomach cancer Neg Hx     Past Surgical History:  Procedure Laterality Date  . COLONOSCOPY      ROS: Review of Systems Negative except as stated above PHYSICAL EXAM: BP 126/84   Pulse 85   Temp 99.4 F (37.4 C) (Oral)   Resp 16   Wt 133 lb 9.6 oz (60.6 kg)   LMP 04/30/2018   SpO2 98%   BMI 26.98 kg/m    Wt Readings from Last 3 Encounters:  05/22/18 133 lb 9.6 oz (60.6 kg)  04/06/18 134 lb (60.8 kg)  03/27/18 134 lb 6 oz (61 kg)    Physical Exam  General appearance - alert, well appearing, and in no distress Mental status - normal mood, behavior, speech, dress, motor activity, and thought processes Neck - supple, no significant adenopathy Chest - clear to auscultation, no wheezes, rales or rhonchi, symmetric air entry Heart - normal rate, regular rhythm, normal S1, S2, no murmurs, rubs, clicks or gallops Musculoskeletal -right shoulder: No edema noted at this time.  No point tenderness.  Good range of motion.    ASSESSMENT AND PLAN: 1. Other specified anxiety disorders Recommend starting her on BuSpar 5 mg twice daily.  Patient agreeable to trying the medication.  She will follow-up again in several weeks to see how she is doing.  2. Acute pain of right shoulder No pain or swelling noted at this time.  I have advised her that the next time it occurs she should keep a log of what she did that day to see whether it is related to any particular activity  3. 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  See #1 above. Encouraged her to cut back on the amount that she drinks.  For women to recommend that no more than 1 standard drink per day. Good sleep hygiene  discussed.  4. Excessive drinking alcohol See #3 above   Patient was given the opportunity to ask questions.  Patient verbalized understanding of the plan and was able to repeat key elements of the plan.   No orders of the defined types were placed in this encounter.    Requested Prescriptions  Signed Prescriptions Disp Refills  . busPIRone (BUSPAR) 5 MG tablet 60 tablet 1    Sig: Take 1 tablet (5 mg total) by mouth 3 (three) times daily.    Return in about 7 weeks (around 07/10/2018).  Karle Plumber, MD, FACP

## 2018-05-22 NOTE — Progress Notes (Signed)
Pt states she here for a follow up on endoscopy  Pt states she has anxiety and it is hard for her to sleep at night  Pt states she has been taking ibuprofen for her back pain and it has not been helping  Pt states she is needing

## 2018-05-22 NOTE — Patient Instructions (Signed)
Start the BuSpar 5 mg twice a day for anxiety symptoms.  Next time you develop swelling in the right shoulder please pay attention to what you may have done that day that may have caused the issue.  Try getting in bed around the same time every night.  Turn off all lights and sounds once in bed.  If you are unable to fall asleep within 45 minutes you should get up and do something until you feel sleepy.  You should avoid drinking alcohol and caffeinated beverages within several hours of bedtime.

## 2018-06-14 ENCOUNTER — Ambulatory Visit: Payer: BLUE CROSS/BLUE SHIELD | Attending: Family Medicine | Admitting: Family Medicine

## 2018-06-14 ENCOUNTER — Encounter: Payer: Self-pay | Admitting: Family Medicine

## 2018-06-14 VITALS — BP 138/81 | HR 75 | Temp 98.4°F | Ht 59.0 in | Wt 136.2 lb

## 2018-06-14 DIAGNOSIS — M238X1 Other internal derangements of right knee: Secondary | ICD-10-CM

## 2018-06-14 DIAGNOSIS — M5136 Other intervertebral disc degeneration, lumbar region: Secondary | ICD-10-CM

## 2018-06-14 MED ORDER — MELOXICAM 7.5 MG PO TABS: 7.5000 mg | ORAL_TABLET | Freq: Every day | ORAL | 1 refills | Status: DC

## 2018-06-14 MED ORDER — TIZANIDINE HCL 4 MG PO TABS: 4.0000 mg | ORAL_TABLET | Freq: Three times a day (TID) | ORAL | 1 refills | Status: DC | PRN

## 2018-06-14 NOTE — Patient Instructions (Signed)

## 2018-06-14 NOTE — Progress Notes (Signed)
Subjective:  Patient ID: Natalie Thornton, female    DOB: [birth year]  Age: 33 y.o. MRN: [id number]  CC: Back Pain and Knee Pain   HPI Natalie Thornton is a 32 year old female patient of Dr. Wynetta Emery who presents today for an acute visit complaining of chronic back pain for several years - since 2011 and informs me she has received epidural spinal injections in the past and was seen by a chiropractor until 2012.  States she was diagnosed with degenerative disc disease. X-ray of the lumbar spine from 07/2017 revealed degenerative disc disease at L4/sacrum. She has also noticed cracking of her right knee when she bends but denies the presence of swelling or pain and symptoms are absent in her left knee.  Past Medical History:  Diagnosis Date  . Anxiety   . Arthritis   . DDD (degenerative disc disease)   . Degenerative disc disease, cervical   . Disc disease, degenerative, cervical   . H pylori ulcer   . H/O degenerative disc disease 2013  . Pilonidal cyst     Past Surgical History:  Procedure Laterality Date  . COLONOSCOPY      No Known Allergies   Outpatient Medications Prior to Visit  Medication Sig Dispense Refill  . busPIRone (BUSPAR) 5 MG tablet Take 1 tablet (5 mg total) by mouth 3 (three) times daily. (Patient not taking: Reported on 06/14/2018) 60 tablet 1  . omeprazole (PRILOSEC) 20 MG capsule Take 1 capsule (20 mg total) by mouth 2 (two) times daily before a meal. X 2 weeks then once daily (Patient not taking: Reported on 06/14/2018) 60 capsule 1  . ondansetron (ZOFRAN ODT) 4 MG disintegrating tablet Take 1 tablet (4 mg total) by mouth every 6 (six) hours as needed. (Patient not taking: Reported on 06/14/2018) 20 tablet 0   No facility-administered medications prior to visit.     ROS Review of Systems  Constitutional: Negative for activity change, appetite change and fatigue.  HENT: Negative for congestion, sinus pressure and sore throat.   Eyes: Negative for visual  disturbance.  Respiratory: Negative for cough, chest tightness, shortness of breath and wheezing.   Cardiovascular: Negative for chest pain and palpitations.  Gastrointestinal: Negative for abdominal distention, abdominal pain and constipation.  Endocrine: Negative for polydipsia.  Genitourinary: Negative for dysuria and frequency.  Musculoskeletal: Negative for back pain.       See hpi  Skin: Negative for rash.  Neurological: Negative for tremors, light-headedness and numbness.  Hematological: Does not bruise/bleed easily.  Psychiatric/Behavioral: Negative for agitation and behavioral problems.    Objective:  BP 138/81   Pulse 75   Temp 98.4 F (36.9 C) (Oral)   Ht 4\' 11"  (1.499 m)   Wt 136 lb 3.2 oz (61.8 kg)   SpO2 99%   BMI 27.51 kg/m   BP/Weight 06/14/2018 05/22/2018 31/5/31  Systolic BP 607 371 062  Diastolic BP 81 84 88  Wt. (Lbs) 136.2 133.6 -  BMI 27.51 26.98 -      Physical Exam Constitutional:      Appearance: She is well-developed.  Cardiovascular:     Rate and Rhythm: Normal rate.     Heart sounds: Normal heart sounds. No murmur.  Pulmonary:     Effort: Pulmonary effort is normal.     Breath sounds: Normal breath sounds. No wheezing or rales.  Chest:     Chest wall: No tenderness.  Abdominal:     General: Bowel sounds are normal. There is  no distension.     Palpations: Abdomen is soft. There is no mass.     Tenderness: There is no abdominal tenderness.  Musculoskeletal: Normal range of motion.     Comments: No tenderness elicited on palpation or range of motion of back; negative straight leg raise bilaterally. Able to flex and extend spine without difficulty. Crepitus on flexion and extension of right knee not associated with pain. Left knee is normal  Neurological:     Mental Status: She is alert and oriented to person, place, and time.      Assessment & Plan:   1. Degeneration of intervertebral disc of lumbar region Evident from lumbar  spine x-ray from 07/2017 No red flags We will place on NSAIDs and muscle relaxants, referred to physical medicine and rehab. After exiting the room, I was informed by the nurse that the patient requested an MRI.  Advised to proceed with current management at this time and at subsequent visits will be reassessed for the need for MRI - Ambulatory referral to Physical Medicine Rehab - tiZANidine (ZANAFLEX) 4 MG tablet; Take 1 tablet (4 mg total) by mouth every 8 (eight) hours as needed for muscle spasms.  Dispense: 60 tablet; Refill: 1 - meloxicam (MOBIC) 7.5 MG tablet; Take 1 tablet (7.5 mg total) by mouth daily.  Dispense: 30 tablet; Refill: 1  2. Crepitus of joint of right knee Not associated with tenderness or inflammation Patient reassured   Meds ordered this encounter  Medications  . tiZANidine (ZANAFLEX) 4 MG tablet    Sig: Take 1 tablet (4 mg total) by mouth every 8 (eight) hours as needed for muscle spasms.    Dispense:  60 tablet    Refill:  1  . meloxicam (MOBIC) 7.5 MG tablet    Sig: Take 1 tablet (7.5 mg total) by mouth daily.    Dispense:  30 tablet    Refill:  1    Follow-up: Return in about 3 weeks (around 07/05/2018) for follow up of back pain.   Charlott Rakes MD

## 2018-06-14 NOTE — Progress Notes (Signed)
Patient is having pain in right knee.

## 2018-06-25 ENCOUNTER — Encounter: Payer: Self-pay | Admitting: Physical Medicine & Rehabilitation

## 2018-07-10 ENCOUNTER — Telehealth: Payer: Self-pay | Admitting: Internal Medicine

## 2018-07-10 NOTE — Telephone Encounter (Signed)
Yahir could you schedule pt an appointment  

## 2018-07-10 NOTE — Telephone Encounter (Signed)
Will forward to pcp

## 2018-07-10 NOTE — Telephone Encounter (Signed)
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 Please follow up.

## 2018-07-11 ENCOUNTER — Encounter: Payer: Self-pay | Admitting: Physician Assistant

## 2018-07-11 ENCOUNTER — Ambulatory Visit: Payer: BLUE CROSS/BLUE SHIELD | Attending: Primary Care | Admitting: Physician Assistant

## 2018-07-11 VITALS — BP 110/75 | HR 77 | Temp 98.4°F | Resp 14 | Wt 133.2 lb

## 2018-07-11 DIAGNOSIS — D171 Benign lipomatous neoplasm of skin and subcutaneous tissue of trunk: Secondary | ICD-10-CM

## 2018-07-11 DIAGNOSIS — D179 Benign lipomatous neoplasm, unspecified: Secondary | ICD-10-CM

## 2018-07-11 NOTE — Patient Instructions (Signed)
Lipoma    A lipoma is a noncancerous (benign) tumor that is made up of fat cells. This is a very common type of soft-tissue growth. Lipomas are usually found under the skin (subcutaneous). They may occur in any tissue of the body that contains fat. Common areas for lipomas to appear include the back, shoulders, buttocks, and thighs.   Lipomas grow slowly, and they are usually painless. Most lipomas do not cause problems and do not require treatment.  What are the causes?  The cause of this condition is not known.  What increases the risk?  You are more likely to develop this condition if:   You are 40-60 years old.   You have a family history of lipomas.  What are the signs or symptoms?  A lipoma usually appears as a small, round bump under the skin. In most cases, the lump will:   Feel soft or rubbery.   Not cause pain or other symptoms.  However, if a lipoma is located in an area where it pushes on nerves, it can become painful or cause other symptoms.  How is this diagnosed?  A lipoma can usually be diagnosed with a physical exam. You may also have tests to confirm the diagnosis and to rule out other conditions. Tests may include:   Imaging tests, such as a CT scan or MRI.   Removal of a tissue sample to be looked at under a microscope (biopsy).  How is this treated?  Treatment for this condition depends on the size of the lipoma and whether it is causing any symptoms.   For small lipomas that are not causing problems, no treatment is needed.   If a lipoma is bigger or it causes problems, surgery may be done to remove the lipoma. Lipomas can also be removed to improve appearance. Most often, the procedure is done after applying a medicine that numbs the area (local anesthetic).  Follow these instructions at home:   Watch your lipoma for any changes.   Keep all follow-up visits as told by your health care provider. This is important.  Contact a health care provider if:   Your lipoma becomes larger or  hard.   Your lipoma becomes painful, red, or increasingly swollen. These could be signs of infection or a more serious condition.  Get help right away if:   You develop tingling or numbness in an area near the lipoma. This could indicate that the lipoma is causing nerve damage.  Summary   A lipoma is a noncancerous tumor that is made up of fat cells.   Most lipomas do not cause problems and do not require treatment.   If a lipoma is bigger or it causes problems, surgery may be done to remove the lipoma.  This information is not intended to replace advice given to you by your health care provider. Make sure you discuss any questions you have with your health care provider.  Document Released: 05/13/2002 Document Revised: 05/09/2017 Document Reviewed: 05/09/2017  [employer]vier Interactive Patient Education  2019 [employer]

## 2018-07-11 NOTE — Progress Notes (Signed)
Natalie Thornton, is a 33 y.o. female  ERX:[id number]  [id number]:[id number]  DOB - [birth year]  Subjective:  Chief Complaint and HPI: Natalie Thornton is a 33 y.o. female here today for a lipoma that is increasing in size. She has had it for about 3 years.  Some pain.  It is located on her mid R back  ROS:   Constitutional:  No f/c, No night sweats, No unexplained weight loss. EENT:  No vision changes, No blurry vision, No hearing changes. No mouth, throat, or ear problems.  Respiratory: No cough, No SOB Cardiac: No CP, no palpitations GI:  No abd pain, No N/V/D. GU: No Urinary s/sx Musculoskeletal: No joint pain Neuro: No headache, no dizziness, no motor weakness.  Skin: No rash Endocrine:  No polydipsia. No polyuria.  Psych: Denies SI/HI  No problems updated.  ALLERGIES: No Known Allergies  PAST MEDICAL HISTORY: Past Medical History:  Diagnosis Date  . Anxiety   . Arthritis   . DDD (degenerative disc disease)   . Degenerative disc disease, cervical   . Disc disease, degenerative, cervical   . H pylori ulcer   . H/O degenerative disc disease 2013  . Pilonidal cyst     MEDICATIONS AT HOME: Prior to Admission medications   Medication Sig Start Date End Date Taking? Authorizing Provider  busPIRone (BUSPAR) 5 MG tablet Take 1 tablet (5 mg total) by mouth 3 (three) times daily. Patient not taking: Reported on 06/14/2018 05/22/18   Ladell Pier, MD  meloxicam (MOBIC) 7.5 MG tablet Take 1 tablet (7.5 mg total) by mouth daily. 06/14/18   Charlott Rakes, MD  omeprazole (PRILOSEC) 20 MG capsule Take 1 capsule (20 mg total) by mouth 2 (two) times daily before a meal. X 2 weeks then once daily Patient not taking: Reported on 06/14/2018 02/25/18   Argentina Donovan, PA-C  ondansetron (ZOFRAN ODT) 4 MG disintegrating tablet Take 1 tablet (4 mg total) by mouth every 6 (six) hours as needed. Patient not taking: Reported on 06/14/2018 03/13/18   Ward, Delice Bison, DO  tiZANidine (ZANAFLEX) 4 MG  tablet Take 1 tablet (4 mg total) by mouth every 8 (eight) hours as needed for muscle spasms. 06/14/18   Charlott Rakes, MD     Objective:  EXAM:   Vitals:   07/11/18 1349  BP: 110/75  Pulse: 77  Resp: 14  Temp: 98.4 F (36.9 C)  SpO2: 98%  Weight: 133 lb 3.2 oz (60.4 kg)    General appearance : A&OX3. NAD. Non-toxic-appearing HEENT: Atraumatic and Normocephalic.  PERRLA. EOM intact.   Chest/Lungs:  Breathing-non-labored, Good air entry bilaterally, breath sounds normal without rales, rhonchi, or wheezing  CVS: S1 S2 regular, no murmurs, gallops, rubs  Lipoma 3X4cm freely mobile R mid back Neurology:  CN II-XII grossly intact, Non focal.   Psych:  TP linear. J/I WNL. Normal speech. Appropriate eye contact and affect.  Skin:  No Rash  Data Review No results found for: HGBA1C   Assessment & Plan   1. Lipoma, unspecified site - Ambulatory referral to General Surgery     Patient have been counseled extensively about nutrition and exercise  Return in about 3 months (around 10/09/2018) for with PCP.  The patient was given clear instructions to go to ER or return to medical center if symptoms don't improve, worsen or new problems develop. The patient verbalized understanding. The patient was told to call to get lab results if they haven't heard anything in the next week.  Freeman Caldron, PA-C [facility] [place], [place]   07/11/2018, 1:51 PM

## 2018-07-12 ENCOUNTER — Ambulatory Visit: Payer: Medicaid Other | Admitting: Internal Medicine

## 2018-07-17 ENCOUNTER — Ambulatory Visit (HOSPITAL_BASED_OUTPATIENT_CLINIC_OR_DEPARTMENT_OTHER): Payer: BLUE CROSS/BLUE SHIELD | Admitting: Physical Medicine & Rehabilitation

## 2018-07-17 ENCOUNTER — Encounter: Payer: Self-pay | Admitting: Physical Medicine & Rehabilitation

## 2018-07-17 ENCOUNTER — Encounter: Payer: BLUE CROSS/BLUE SHIELD | Attending: Physical Medicine & Rehabilitation

## 2018-07-17 VITALS — BP 115/68 | HR 79 | Ht 59.0 in | Wt 132.0 lb

## 2018-07-17 DIAGNOSIS — M546 Pain in thoracic spine: Secondary | ICD-10-CM | POA: Diagnosis not present

## 2018-07-17 DIAGNOSIS — G8929 Other chronic pain: Secondary | ICD-10-CM | POA: Diagnosis not present

## 2018-07-17 DIAGNOSIS — M5136 Other intervertebral disc degeneration, lumbar region: Secondary | ICD-10-CM

## 2018-07-17 MED ORDER — CYCLOBENZAPRINE HCL 5 MG PO TABS: 5.0000 mg | ORAL_TABLET | Freq: Every day | ORAL | 0 refills | Status: DC

## 2018-07-17 NOTE — Progress Notes (Addendum)
Subjective:    Patient ID: Natalie Thornton, female    DOB: 1985-06-27, 33 y.o.   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   Pain Inventory Average Pain 10 Pain Right Now 7 My pain is intermittent, constant, sharp, stabbing and aching  In the last 24 hours, has pain interfered with the following? General activity 10 Relation with others 10 Enjoyment of life 10 What TIME of day is your pain at its worst? all Sleep (in general) Poor  Pain is worse with: walking, bending, sitting, standing and some activites Pain improves with: medication Relief from Meds: 2  Mobility walk without assistance ability to climb steps?  yes do you drive?  yes  Function employed # of hrs/week 20  Neuro/Psych weakness numbness tingling trouble walking spasms  Prior Studies Any changes since last visit?  no  Physicians involved in your care Any changes since last visit?  no   Family History  Problem Relation Age of Onset  . AAA (abdominal aortic aneurysm) Maternal Grandmother   . Leukemia Maternal Grandmother   . Breast cancer Maternal Grandmother   . Head & neck cancer Paternal Grandmother        Back cancer  . Lymphoma Paternal Grandmother   . Cirrhosis Maternal Uncle   . Breast cancer Other   . Colon cancer Neg Hx   . Esophageal cancer Neg Hx   . Rectal cancer Neg Hx   . Stomach cancer  Neg Hx    Social History   Socioeconomic History  . Marital status: Single    Spouse name: Not on file  . Number of children: 2  . Years of education: Not on file  . Highest education level: Not on file  Occupational History  . Occupation: [employer]  . Financial resource strain: Not on file  . Food insecurity:    Worry: Not on file    Inability: Not on file  . Transportation needs:    Medical: Not on file    Non-medical: Not on file  Tobacco Use  . Smoking status: Current Every Day Smoker    Packs/day: 5.00    Years: 10.00    Pack years: 50.00    Types: Cigars  . Smokeless tobacco: Never Used  . Tobacco comment: tobacco info given 03/14/17  Substance and Sexual Activity  . Alcohol use: Yes    Comment: occasionally  . Drug use: No  . Sexual activity: Not on file  Lifestyle  . Physical activity:    Days per week: Not on file    Minutes per session: Not on file  . Stress: Not on file  Relationships  . Social connections:    Talks on phone: Not on file    Gets together: Not on file    Attends religious service: Not on file    Active member of club or organization: Not on file  Attends meetings of clubs or organizations: Not on file    Relationship status: Not on file  Other Topics Concern  . Not on file  Social History Narrative  . Not on file   Past Surgical History:  Procedure Laterality Date  . COLONOSCOPY     Past Medical History:  Diagnosis Date  . Anxiety   . Arthritis   . DDD (degenerative disc disease)   . Degenerative disc disease, cervical   . Disc disease, degenerative, cervical   . H pylori ulcer   . H/O degenerative disc disease 2013  . Pilonidal cyst    BP 115/68   Pulse 79   Ht 4\' 11"  (1.499 m)   Wt 132 lb (59.9 kg)   LMP 07/10/2018   SpO2 97%   BMI 26.66 kg/m   Opioid Risk Score:   Fall Risk Score:  `1  Depression screen PHQ 2/9  Depression screen [facility] 2/9 07/11/2018 06/14/2018 05/22/2018 02/22/2018 01/09/2018  09/21/2017 08/30/2017  Decreased Interest 1 1 1 3 2 3 1   Down, Depressed, Hopeless 0 1 0 3 2 3 3   PHQ - 2 Score 1 2 1 6 4 6 4   Altered sleeping 1 3 - 3 3 3 3   Tired, decreased energy 1 1 - 3 3 3 3   Change in appetite 0 0 - 3 3 3 2   Feeling bad or failure about yourself  0 0 - 0 0 0 0  Trouble concentrating 0 0 - 1 2 2  0  Moving slowly or fidgety/restless 0 0 - 3 0 1 0  Suicidal thoughts 0 0 - 0 0 0 0  PHQ-9 Score 3 6 - 19 15 18 12   Some recent data might be hidden     Review of Systems  Constitutional: Negative.   HENT: Negative.   Eyes: Negative.   Respiratory: Negative.   Cardiovascular: Negative.   Gastrointestinal: Negative.   Endocrine: Negative.   Genitourinary: Negative.   Musculoskeletal: Positive for arthralgias, gait problem and myalgias.  Skin: Negative.   Allergic/Immunologic: Negative.   Neurological: Positive for weakness and numbness.  Hematological: Negative.   Psychiatric/Behavioral: Negative.   All other systems reviewed and are negative.      Objective:   Physical Exam Vitals signs and nursing note reviewed.  Constitutional:      Appearance: Normal appearance. She is normal weight.  HENT:     Head: Normocephalic and atraumatic.     Nose: Nose normal.     Mouth/Throat:     Mouth: Mucous membranes are moist.  Eyes:     Extraocular Movements: Extraocular movements intact.     Conjunctiva/sclera: Conjunctivae normal.     Pupils: Pupils are equal, round, and reactive to light.  Neck:     Musculoskeletal: Normal range of motion. No neck rigidity.  Cardiovascular:     Rate and Rhythm: Normal rate and regular rhythm.     Heart sounds: Normal heart sounds. No murmur.  Pulmonary:     Effort: Pulmonary effort is normal.     Breath sounds: Normal breath sounds. No stridor.  Abdominal:     General: Abdomen is flat. Bowel sounds are normal. There is no distension.     Palpations: Abdomen is soft.     Tenderness: There is no abdominal tenderness.    Musculoskeletal:     Right shoulder: Normal.     Left shoulder: Normal.     Right hip: Normal.     Left hip: Normal.  Cervical back: She exhibits normal range of motion, no tenderness and no deformity.     Thoracic back: She exhibits tenderness. She exhibits normal range of motion and no deformity.     Lumbar back: She exhibits tenderness. She exhibits normal range of motion and no deformity.     Comments: Positive pain with lumbar flexion greater than lumbar extension.  Skin:    General: Skin is warm and dry.  Neurological:     General: No focal deficit present.     Mental Status: She is alert and oriented to person, place, and time.     Motor: No weakness or tremor.     Deep Tendon Reflexes:     Reflex Scores:      Tricep reflexes are 2+ on the right side and 2+ on the left side.      Bicep reflexes are 2+ on the right side and 2+ on the left side.      Brachioradialis reflexes are 2+ on the right side and 2+ on the left side.      Patellar reflexes are 1+ on the right side and 1+ on the left side.      Achilles reflexes are 1+ on the right side and 1+ on the left side.    Comments: Motor strength is 5/5 bilateral deltoid, bicep, tricep, grip, hip flexor, knee extensor, ankle dorsiflexor Sensation intact to light touch bilateral upper and lower limbs   Psychiatric:        Mood and Affect: Mood normal.        Behavior: Behavior normal.           Assessment & Plan:  1.  Chronic low back pain likely discogenic has evidence of decreased disc space at L5-S1.  She has no clear-cut radicular signs.  No focal neurologic deficits on examination. We will give lumbar stabilization exercises, her Medicaid benefits only cover one therapy visit Prescribe cyclobenzaprine 5 mg nightly for sleep and for complaints of "muscle spasm" Follow-up in 1 month if not doing much better may consider additional imaging studies  2.  Chronic thoracic pain appears to be mainly muscular have given  her superman exercises, I do not think any additional imaging studies are needed for this problem at the current time.

## 2018-07-17 NOTE — Patient Instructions (Signed)
Do Superman exercise for mid back 3 sets of 10 reps daily   Back Exercises If you have pain in your back, do these exercises 2-3 times each day or as told by your doctor. When the pain goes away, do the exercises once each day, but repeat the steps more times for each exercise (do more repetitions). If you do not have pain in your back, do these exercises once each day or as told by your doctor. Exercises Single Knee to Chest Do these steps 3-5 times in a row for each leg: 1. Lie on your back on a firm bed or the floor with your legs stretched out. 2. Bring one knee to your chest. 3. Hold your knee to your chest by grabbing your knee or thigh. 4. Pull on your knee until you feel a gentle stretch in your lower back. 5. Keep doing the stretch for 10-30 seconds. 6. Slowly let go of your leg and straighten it. Pelvic Tilt Do these steps 5-10 times in a row: 1. Lie on your back on a firm bed or the floor with your legs stretched out. 2. Bend your knees so they point up to the ceiling. Your feet should be flat on the floor. 3. Tighten your lower belly (abdomen) muscles to press your lower back against the floor. This will make your tailbone point up to the ceiling instead of pointing down to your feet or the floor. 4. Stay in this position for 5-10 seconds while you gently tighten your muscles and breathe evenly. Cat-Cow Do these steps until your lower back bends more easily: 1. Get on your hands and knees on a firm surface. Keep your hands under your shoulders, and keep your knees under your hips. You may put padding under your knees. 2. Let your head hang down, and make your tailbone point down to the floor so your lower back is round like the back of a cat. 3. Stay in this position for 5 seconds. 4. Slowly lift your head and make your tailbone point up to the ceiling so your back hangs low (sags) like the back of a cow. 5. Stay in this position for 5 seconds.  Press-Ups Do these steps 5-10  times in a row: 1. Lie on your belly (face-down) on the floor. 2. Place your hands near your head, about shoulder-width apart. 3. While you keep your back relaxed and keep your hips on the floor, slowly straighten your arms to raise the top half of your body and lift your shoulders. Do not use your back muscles. To make yourself more comfortable, you may change where you place your hands. 4. Stay in this position for 5 seconds. 5. Slowly return to lying flat on the floor.  Bridges Do these steps 10 times in a row: 1. Lie on your back on a firm surface. 2. Bend your knees so they point up to the ceiling. Your feet should be flat on the floor. 3. Tighten your butt muscles and lift your butt off of the floor until your waist is almost as high as your knees. If you do not feel the muscles working in your butt and the back of your thighs, slide your feet 1-2 inches farther away from your butt. 4. Stay in this position for 3-5 seconds. 5. Slowly lower your butt to the floor, and let your butt muscles relax. If this exercise is too easy, try doing it with your arms crossed over your chest. Belly Crunches Do  these steps 5-10 times in a row: 1. Lie on your back on a firm bed or the floor with your legs stretched out. 2. Bend your knees so they point up to the ceiling. Your feet should be flat on the floor. 3. Cross your arms over your chest. 4. Tip your chin a little bit toward your chest but do not bend your neck. 5. Tighten your belly muscles and slowly raise your chest just enough to lift your shoulder blades a tiny bit off of the floor. 6. Slowly lower your chest and your head to the floor. Back Lifts Do these steps 5-10 times in a row: 1. Lie on your belly (face-down) with your arms at your sides, and rest your forehead on the floor. 2. Tighten the muscles in your legs and your butt. 3. Slowly lift your chest off of the floor while you keep your hips on the floor. Keep the back of your head in  line with the curve in your back. Look at the floor while you do this. 4. Stay in this position for 3-5 seconds. 5. Slowly lower your chest and your face to the floor. Contact a doctor if:  Your back pain gets a lot worse when you do an exercise.  Your back pain does not lessen 2 hours after you exercise. If you have any of these problems, stop doing the exercises. Do not do them again unless your doctor says it is okay. Get help right away if:  You have sudden, very bad back pain. If this happens, stop doing the exercises. Do not do them again unless your doctor says it is okay. This information is not intended to replace advice given to you by your health care provider. Make sure you discuss any questions you have with your health care provider. Document Released: 06/25/2010 Document Revised: 02/14/2018 Document Reviewed: 07/17/2014 Elsevier Interactive Patient Education  [employer].

## 2018-07-18 ENCOUNTER — Telehealth: Payer: Self-pay | Admitting: Internal Medicine

## 2018-07-18 NOTE — Telephone Encounter (Signed)
Pt called stating he got referred to [facility] pt tried calling several times has had no luck reaching them to schedule appt would like to see if provider/nurse would be able to follow up

## 2018-07-19 NOTE — Telephone Encounter (Signed)
Can you please check on the status of this referral please?  Thank you! Levada Dy

## 2018-07-23 NOTE — Telephone Encounter (Signed)
Gm I contacted [facility] and the specialist is reviewing  the notes

## 2018-07-27 ENCOUNTER — Telehealth: Payer: Self-pay | Admitting: *Deleted

## 2018-07-27 NOTE — Telephone Encounter (Signed)
Patient left a message stating that muscle relaxers, exercise, therapies are not helping.  She states she is in uncontrolled pain.  She states she cannot wait 4 weeks for follow up.  She is asking if an order could be placed sooner than later for back imaging studies.

## 2018-08-02 ENCOUNTER — Telehealth: Payer: Self-pay | Admitting: Internal Medicine

## 2018-08-02 NOTE — Telephone Encounter (Signed)
Sent the Referral to [facility] that is part of  [facility]

## 2018-08-02 NOTE — Telephone Encounter (Signed)
Patient called because her insurance is not accepted by [facility]. Patient states she is at her appt. And it would cost too much out of pocket to get seen. Please follow up

## 2018-08-06 ENCOUNTER — Other Ambulatory Visit: Payer: Self-pay

## 2018-08-06 ENCOUNTER — Encounter: Payer: Self-pay | Admitting: General Surgery

## 2018-08-06 ENCOUNTER — Ambulatory Visit (INDEPENDENT_AMBULATORY_CARE_PROVIDER_SITE_OTHER): Payer: BLUE CROSS/BLUE SHIELD | Admitting: General Surgery

## 2018-08-06 ENCOUNTER — Telehealth: Payer: Self-pay

## 2018-08-06 VITALS — BP 123/81 | HR 97 | Temp 97.9°F | Ht 60.0 in | Wt 130.0 lb

## 2018-08-06 DIAGNOSIS — D171 Benign lipomatous neoplasm of skin and subcutaneous tissue of trunk: Secondary | ICD-10-CM

## 2018-08-06 NOTE — H&P (View-Only) (Signed)
Patient ID: Natalie Thornton, female   DOB: [birth year], 33 y.o.   MRN: [id number]  Chief Complaint  Patient presents with  . Lipoma    HPI CATERA Natalie Thornton is a 33 y.o. female.  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  Recently, it has become somewhat larger, causing irritation where it rubs her bra strap.  She is interested in having it removed.  She states that it has never become infected, drained anything, or otherwise behaved is anything other than a lipoma.   Past Medical History:  Diagnosis Date  . Anxiety   . Arthritis   . DDD (degenerative disc disease)   . Degenerative disc disease, cervical   . Disc disease, degenerative, cervical   . H pylori ulcer   . H/O degenerative disc disease 2013  . Pilonidal cyst     Past Surgical History:  Procedure Laterality Date  . COLONOSCOPY      Family History  Problem Relation Age of Onset  . AAA (abdominal aortic aneurysm) Maternal Grandmother   . Leukemia Maternal Grandmother   . Breast cancer Maternal Grandmother   . Head & neck cancer Paternal Grandmother        Back cancer  . Lymphoma Paternal Grandmother   . Cirrhosis Maternal Uncle   . Breast cancer Other   . Colon cancer Neg Hx   . Esophageal cancer Neg Hx   . Rectal cancer Neg Hx   . Stomach cancer Neg Hx     Social History Social History   Tobacco Use  . Smoking status: Current Every Day Smoker    Packs/day: 5.00    Years: 10.00    Pack years: 50.00    Types: Cigars  . Smokeless tobacco: Never Used  . Tobacco comment: tobacco info given 03/14/17  Substance Use Topics  . Alcohol use: Yes    Comment: occasionally  . Drug use: No    No Known Allergies  No current outpatient medications on file.   No current facility-administered medications for this visit.     Review of Systems Review of Systems  All other systems reviewed and are negative.   Blood pressure 123/81, pulse 97, temperature 97.9 F (36.6  C), temperature source Skin, height 5' (1.524 m), weight 130 lb (59 kg), last menstrual period 07/10/2018, SpO2 99 %.  Physical Exam Physical Exam Constitutional:      General: She is not in acute distress.    Appearance: Normal appearance. She is normal weight.  HENT:     Head: Normocephalic.     Mouth/Throat:     Mouth: Mucous membranes are moist.     Pharynx: Oropharynx is clear.  Eyes:     General: No scleral icterus.       Right eye: No discharge.        Left eye: No discharge.     Pupils: Pupils are equal, round, and reactive to light.  Neck:     Musculoskeletal: Normal range of motion and neck supple.  Cardiovascular:     Rate and Rhythm: Normal rate and regular rhythm.     Heart sounds: No murmur.  Pulmonary:     Effort: Pulmonary effort is normal.     Breath sounds: Normal breath sounds.       Comments: 5-6 cm soft mobile mass, consistent with a lipoma. Abdominal:     General: Abdomen is flat.     Palpations: Abdomen is  soft.  Genitourinary:    Comments: Deferred. Musculoskeletal: Normal range of motion.        General: No swelling or tenderness.  Lymphadenopathy:     Cervical: No cervical adenopathy.  Skin:    General: Skin is warm and dry.     Comments: Multiple tattoos.  Neurological:     General: No focal deficit present.     Mental Status: She is alert and oriented to person, place, and time.  Psychiatric:        Mood and Affect: Mood normal.        Behavior: Behavior normal.        Thought Content: Thought content normal.        Judgment: Judgment normal.     Data Reviewed No relevant data or imaging available.  Assessment This is a 34 year old woman with a right upper back mass, consistent with a lipoma.  It has been growing over time and is now causing irritation at her bra strap.  She desires removal.  Plan We will plan to take her to the operating room for excision of a lipoma.  The risks of the procedure were discussed with her including,  but not limited to, bleeding, infection, seroma, hematoma, recurrence, or other wound complications.  She agreed to accept these risks and we will proceed with scheduling her for an operation.    Fredirick Maudlin 08/06/2018, 11:03 AM

## 2018-08-06 NOTE — Telephone Encounter (Signed)
Spoke with the patient about scheduling her surgery. The patient is scheduled for surgery at [facility] with Dr Celine Ahr on 08/10/18. She will pre admit by phone and pre admit testing will call her. She is aware to call the day before to get her arrival time. The patient is aware of date and instructions.

## 2018-08-06 NOTE — Patient Instructions (Signed)
Lipoma    A lipoma is a noncancerous (benign) tumor that is made up of fat cells. This is a very common type of soft-tissue growth. Lipomas are usually found under the skin (subcutaneous). They may occur in any tissue of the body that contains fat. Common areas for lipomas to appear include the back, shoulders, buttocks, and thighs.   Lipomas grow slowly, and they are usually painless. Most lipomas do not cause problems and do not require treatment.  What are the causes?  The cause of this condition is not known.  What increases the risk?  You are more likely to develop this condition if:   You are 40-60 years old.   You have a family history of lipomas.  What are the signs or symptoms?  A lipoma usually appears as a small, round bump under the skin. In most cases, the lump will:   Feel soft or rubbery.   Not cause pain or other symptoms.  However, if a lipoma is located in an area where it pushes on nerves, it can become painful or cause other symptoms.  How is this diagnosed?  A lipoma can usually be diagnosed with a physical exam. You may also have tests to confirm the diagnosis and to rule out other conditions. Tests may include:   Imaging tests, such as a CT scan or MRI.   Removal of a tissue sample to be looked at under a microscope (biopsy).  How is this treated?  Treatment for this condition depends on the size of the lipoma and whether it is causing any symptoms.   For small lipomas that are not causing problems, no treatment is needed.   If a lipoma is bigger or it causes problems, surgery may be done to remove the lipoma. Lipomas can also be removed to improve appearance. Most often, the procedure is done after applying a medicine that numbs the area (local anesthetic).  Follow these instructions at home:   Watch your lipoma for any changes.   Keep all follow-up visits as told by your health care provider. This is important.  Contact a health care provider if:   Your lipoma becomes larger or  hard.   Your lipoma becomes painful, red, or increasingly swollen. These could be signs of infection or a more serious condition.  Get help right away if:   You develop tingling or numbness in an area near the lipoma. This could indicate that the lipoma is causing nerve damage.  Summary   A lipoma is a noncancerous tumor that is made up of fat cells.   Most lipomas do not cause problems and do not require treatment.   If a lipoma is bigger or it causes problems, surgery may be done to remove the lipoma.  This information is not intended to replace advice given to you by your health care provider. Make sure you discuss any questions you have with your health care provider.  Document Released: 05/13/2002 Document Revised: 05/09/2017 Document Reviewed: 05/09/2017  [employer]vier Interactive Patient Education  2019 [employer]

## 2018-08-06 NOTE — Progress Notes (Signed)
Patient ID: Natalie Thornton, female   DOB: 11/04/1985, 33 y.o.   MRN: [id number]  Chief Complaint  Patient presents with  . Lipoma    HPI Natalie Thornton is a 33 y.o. female.  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  Recently, it has become somewhat larger, causing irritation where it rubs her bra strap.  She is interested in having it removed.  She states that it has never become infected, drained anything, or otherwise behaved is anything other than a lipoma.   Past Medical History:  Diagnosis Date  . Anxiety   . Arthritis   . DDD (degenerative disc disease)   . Degenerative disc disease, cervical   . Disc disease, degenerative, cervical   . H pylori ulcer   . H/O degenerative disc disease 2013  . Pilonidal cyst     Past Surgical History:  Procedure Laterality Date  . COLONOSCOPY      Family History  Problem Relation Age of Onset  . AAA (abdominal aortic aneurysm) Maternal Grandmother   . Leukemia Maternal Grandmother   . Breast cancer Maternal Grandmother   . Head & neck cancer Paternal Grandmother        Back cancer  . Lymphoma Paternal Grandmother   . Cirrhosis Maternal Uncle   . Breast cancer Other   . Colon cancer Neg Hx   . Esophageal cancer Neg Hx   . Rectal cancer Neg Hx   . Stomach cancer Neg Hx     Social History Social History   Tobacco Use  . Smoking status: Current Every Day Smoker    Packs/day: 5.00    Years: 10.00    Pack years: 50.00    Types: Cigars  . Smokeless tobacco: Never Used  . Tobacco comment: tobacco info given 03/14/17  Substance Use Topics  . Alcohol use: Yes    Comment: occasionally  . Drug use: No    No Known Allergies  No current outpatient medications on file.   No current facility-administered medications for this visit.     Review of Systems Review of Systems  All other systems reviewed and are negative.   Blood pressure 123/81, pulse 97, temperature 97.9 F (36.6  C), temperature source Skin, height 5' (1.524 m), weight 130 lb (59 kg), last menstrual period 07/10/2018, SpO2 99 %.  Physical Exam Physical Exam Constitutional:      General: She is not in acute distress.    Appearance: Normal appearance. She is normal weight.  HENT:     Head: Normocephalic.     Mouth/Throat:     Mouth: Mucous membranes are moist.     Pharynx: Oropharynx is clear.  Eyes:     General: No scleral icterus.       Right eye: No discharge.        Left eye: No discharge.     Pupils: Pupils are equal, round, and reactive to light.  Neck:     Musculoskeletal: Normal range of motion and neck supple.  Cardiovascular:     Rate and Rhythm: Normal rate and regular rhythm.     Heart sounds: No murmur.  Pulmonary:     Effort: Pulmonary effort is normal.     Breath sounds: Normal breath sounds.       Comments: 5-6 cm soft mobile mass, consistent with a lipoma. Abdominal:     General: Abdomen is flat.     Palpations: Abdomen is  soft.  Genitourinary:    Comments: Deferred. Musculoskeletal: Normal range of motion.        General: No swelling or tenderness.  Lymphadenopathy:     Cervical: No cervical adenopathy.  Skin:    General: Skin is warm and dry.     Comments: Multiple tattoos.  Neurological:     General: No focal deficit present.     Mental Status: She is alert and oriented to person, place, and time.  Psychiatric:        Mood and Affect: Mood normal.        Behavior: Behavior normal.        Thought Content: Thought content normal.        Judgment: Judgment normal.     Data Reviewed No relevant data or imaging available.  Assessment This is a 33 year old woman with a right upper back mass, consistent with a lipoma.  It has been growing over time and is now causing irritation at her bra strap.  She desires removal.  Plan We will plan to take her to the operating room for excision of a lipoma.  The risks of the procedure were discussed with her including,  but not limited to, bleeding, infection, seroma, hematoma, recurrence, or other wound complications.  She agreed to accept these risks and we will proceed with scheduling her for an operation.    Fredirick Maudlin 08/06/2018, 11:03 AM

## 2018-08-09 ENCOUNTER — Encounter
Admission: RE | Admit: 2018-08-09 | Discharge: 2018-08-09 | Disposition: A | Discharge status: Home / Self Care | Payer: BLUE CROSS/BLUE SHIELD | Source: Ambulatory Visit | Attending: General Surgery | Admitting: General Surgery

## 2018-08-09 ENCOUNTER — Other Ambulatory Visit: Payer: Self-pay

## 2018-08-09 HISTORY — DX: Personal history of other diseases of the digestive system: Z87.19

## 2018-08-09 NOTE — Patient Instructions (Signed)
Your procedure is scheduled on: 08-10-18 Report to Same Day Surgery 2nd floor medical mall [facility] Entrance-take elevator on left to 2nd floor.  Check in with surgery information desk.) To find out your arrival time please call [phone] between 1PM - 3PM on 08-09-18  Remember: Instructions that are not followed completely may result in serious medical risk, up to and including death, or upon the discretion of your surgeon and anesthesiologist your surgery may need to be rescheduled.    _x___ 1. Do not eat food after midnight the night before your procedure. You may drink clear liquids up to 2 hours before you are scheduled to arrive at the hospital for your procedure.  Do not drink clear liquids within 2 hours of your scheduled arrival to the hospital.  Clear liquids include  --Water or Apple juice without pulp  --Clear carbohydrate beverage such as ClearFast or Gatorade  --Black Coffee or Clear Tea (No milk, no creamers, do not add anything to the coffee or Tea   ____Ensure clear carbohydrate drink on the way to the hospital for bariatric patients  ____Ensure clear carbohydrate drink 3 hours before surgery for Dr Dwyane Luo patients if physician instructed.   No gum chewing or hard candies.     __x__ 2. No Alcohol for 24 hours before or after surgery.   __x__3. No Smoking or e-cigarettes for 24 prior to surgery.  Do not use any chewable tobacco products for at least 6 hour prior to surgery   ____  4. Bring all medications with you on the day of surgery if instructed.    __x__ 5. Notify your doctor if there is any change in your medical condition     (cold, fever, infections).    x___6. On the morning of surgery brush your teeth with toothpaste and water.  You may rinse your mouth with mouth wash if you wish.  Do not swallow any toothpaste or mouthwash.   Do not wear jewelry, make-up, hairpins, clips or nail polish.  Do not wear lotions, powders, or perfumes. You may wear  deodorant.  Do not shave 48 hours prior to surgery. Men may shave face and neck.  Do not bring valuables to the hospital.    [facility] is not responsible for any belongings or valuables.               Contacts, dentures or bridgework may not be worn into surgery.  Leave your suitcase in the car. After surgery it may be brought to your room.  For patients admitted to the hospital, discharge time is determined by your treatment team.  _  Patients discharged the day of surgery will not be allowed to drive home.  You will need someone to drive you home and stay with you the night of your procedure.    Please read over the following fact sheets that you were given:   [facility] Preparing for Surgery   ____ Take anti-hypertensive listed below, cardiac, seizure, asthma, anti-reflux and psychiatric medicines. These include:  1. NONE  2.  3.  4.  5.  6.  ____Fleets enema or Magnesium Citrate as directed.   ____ Use CHG Soap or sage wipes as directed on instruction sheet   ____ Use inhalers on the day of surgery and bring to hospital day of surgery  ____ Stop Metformin and Janumet 2 days prior to surgery.    ____ Take 1/2 of usual insulin dose the night before surgery and none on  the morning surgery.   ____ Follow recommendations from Cardiologist, Pulmonologist or PCP regarding stopping Aspirin, Coumadin, Plavix ,Eliquis, Effient, or Pradaxa, and Pletal.  X____Stop Anti-inflammatories such as Advil, Aleve, Ibuprofen, Motrin, Naproxen, Naprosyn, Goodies powders or aspirin products NOW-OK to take Tylenol    ____ Stop supplements until after surgery.     ____ Bring C-Pap to the hospital.

## 2018-08-10 ENCOUNTER — Telehealth: Payer: Self-pay

## 2018-08-10 ENCOUNTER — Ambulatory Visit: Payer: BLUE CROSS/BLUE SHIELD | Admitting: Anesthesiology

## 2018-08-10 ENCOUNTER — Encounter
Admission: RE | Disposition: A | Discharge status: Home / Self Care | Payer: Self-pay | Source: Home / Self Care | Attending: General Surgery

## 2018-08-10 ENCOUNTER — Telehealth: Payer: Self-pay | Admitting: General Surgery

## 2018-08-10 ENCOUNTER — Ambulatory Visit
Admission: RE | Admit: 2018-08-10 | Discharge: 2018-08-10 | Disposition: A | Discharge status: Home / Self Care | Payer: BLUE CROSS/BLUE SHIELD | Attending: General Surgery | Admitting: General Surgery

## 2018-08-10 ENCOUNTER — Other Ambulatory Visit: Payer: Self-pay | Admitting: General Surgery

## 2018-08-10 ENCOUNTER — Encounter: Payer: Self-pay | Admitting: Certified Registered Nurse Anesthetist

## 2018-08-10 DIAGNOSIS — M1990 Unspecified osteoarthritis, unspecified site: Secondary | ICD-10-CM | POA: Diagnosis not present

## 2018-08-10 DIAGNOSIS — Z8711 Personal history of peptic ulcer disease: Secondary | ICD-10-CM | POA: Diagnosis not present

## 2018-08-10 DIAGNOSIS — K449 Diaphragmatic hernia without obstruction or gangrene: Secondary | ICD-10-CM | POA: Diagnosis not present

## 2018-08-10 DIAGNOSIS — F17290 Nicotine dependence, other tobacco product, uncomplicated: Secondary | ICD-10-CM | POA: Diagnosis not present

## 2018-08-10 DIAGNOSIS — D171 Benign lipomatous neoplasm of skin and subcutaneous tissue of trunk: Secondary | ICD-10-CM

## 2018-08-10 HISTORY — PX: LIPOMA EXCISION: SHX5283

## 2018-08-10 LAB — POCT PREGNANCY, URINE: Preg Test, Ur: NEGATIVE

## 2018-08-10 SURGERY — EXCISION LIPOMA
Anesthesia: General | Laterality: Right

## 2018-08-10 MED ORDER — FENTANYL CITRATE (PF) 100 MCG/2ML IJ SOLN: 25.0000 ug | INTRAMUSCULAR | Status: DC | PRN

## 2018-08-10 MED ORDER — IBUPROFEN 800 MG PO TABS: 800.0000 mg | ORAL_TABLET | Freq: Three times a day (TID) | ORAL | 0 refills | Status: DC | PRN

## 2018-08-10 MED ORDER — MIDAZOLAM HCL 2 MG/2ML IJ SOLN
INTRAMUSCULAR | Status: AC
  Filled 2018-08-10: qty 2

## 2018-08-10 MED ORDER — FAMOTIDINE 20 MG PO TABS
20.0000 mg | ORAL_TABLET | Freq: Once | ORAL | Status: AC
  Administered 2018-08-10: 20 mg via ORAL

## 2018-08-10 MED ORDER — CELECOXIB 200 MG PO CAPS
200.0000 mg | ORAL_CAPSULE | ORAL | Status: AC
  Administered 2018-08-10: 200 mg via ORAL

## 2018-08-10 MED ORDER — ONDANSETRON HCL 4 MG/2ML IJ SOLN
INTRAMUSCULAR | Status: AC
  Filled 2018-08-10: qty 2

## 2018-08-10 MED ORDER — CEFAZOLIN SODIUM-DEXTROSE 2-4 GM/100ML-% IV SOLN
2.0000 g | INTRAVENOUS | Status: AC
  Administered 2018-08-10: 2 g via INTRAVENOUS

## 2018-08-10 MED ORDER — DEXAMETHASONE SODIUM PHOSPHATE 10 MG/ML IJ SOLN
INTRAMUSCULAR | Status: AC
  Filled 2018-08-10: qty 1

## 2018-08-10 MED ORDER — CELECOXIB 200 MG PO CAPS
ORAL_CAPSULE | ORAL | Status: AC
  Filled 2018-08-10: qty 1

## 2018-08-10 MED ORDER — DEXAMETHASONE SODIUM PHOSPHATE 10 MG/ML IJ SOLN
INTRAMUSCULAR | Status: DC | PRN
  Administered 2018-08-10: 10 mg via INTRAVENOUS

## 2018-08-10 MED ORDER — PHENYLEPHRINE HCL 10 MG/ML IJ SOLN
INTRAMUSCULAR | Status: DC | PRN
  Administered 2018-08-10: 200 ug via INTRAVENOUS
  Administered 2018-08-10 (×3): 100 ug via INTRAVENOUS

## 2018-08-10 MED ORDER — CHLORHEXIDINE GLUCONATE CLOTH 2 % EX PADS: 6.0000 | MEDICATED_PAD | Freq: Once | CUTANEOUS | Status: DC

## 2018-08-10 MED ORDER — BUPIVACAINE HCL 0.25 % IJ SOLN
INTRAMUSCULAR | Status: DC | PRN
  Administered 2018-08-10: 5 mL

## 2018-08-10 MED ORDER — CEFAZOLIN SODIUM-DEXTROSE 2-4 GM/100ML-% IV SOLN
INTRAVENOUS | Status: AC
  Filled 2018-08-10: qty 100

## 2018-08-10 MED ORDER — ACETAMINOPHEN 500 MG PO TABS
1000.0000 mg | ORAL_TABLET | ORAL | Status: AC
  Administered 2018-08-10: 1000 mg via ORAL

## 2018-08-10 MED ORDER — ONDANSETRON HCL 4 MG/2ML IJ SOLN: 4.0000 mg | Freq: Once | INTRAMUSCULAR | Status: DC | PRN

## 2018-08-10 MED ORDER — FENTANYL CITRATE (PF) 100 MCG/2ML IJ SOLN
INTRAMUSCULAR | Status: DC | PRN
  Administered 2018-08-10 (×2): 50 ug via INTRAVENOUS

## 2018-08-10 MED ORDER — ATROPINE SULFATE 0.4 MG/ML IJ SOLN
INTRAMUSCULAR | Status: DC | PRN
  Administered 2018-08-10: 0.2 mg via INTRAVENOUS

## 2018-08-10 MED ORDER — ROCURONIUM BROMIDE 50 MG/5ML IV SOLN
INTRAVENOUS | Status: AC
  Filled 2018-08-10: qty 1

## 2018-08-10 MED ORDER — HYDROCODONE-ACETAMINOPHEN 5-325 MG PO TABS: 1.0000 | ORAL_TABLET | Freq: Four times a day (QID) | ORAL | 0 refills | Status: DC | PRN

## 2018-08-10 MED ORDER — BUPIVACAINE HCL (PF) 0.25 % IJ SOLN
INTRAMUSCULAR | Status: AC
  Filled 2018-08-10: qty 30

## 2018-08-10 MED ORDER — GABAPENTIN 300 MG PO CAPS
ORAL_CAPSULE | ORAL | Status: AC
  Filled 2018-08-10: qty 1

## 2018-08-10 MED ORDER — PROPOFOL 10 MG/ML IV BOLUS
INTRAVENOUS | Status: AC
  Filled 2018-08-10: qty 20

## 2018-08-10 MED ORDER — ONDANSETRON HCL 4 MG/2ML IJ SOLN
INTRAMUSCULAR | Status: DC | PRN
  Administered 2018-08-10: 4 mg via INTRAVENOUS

## 2018-08-10 MED ORDER — MIDAZOLAM HCL 2 MG/2ML IJ SOLN
INTRAMUSCULAR | Status: DC | PRN
  Administered 2018-08-10: 2 mg via INTRAVENOUS

## 2018-08-10 MED ORDER — CHLORHEXIDINE GLUCONATE CLOTH 2 % EX PADS
6.0000 | MEDICATED_PAD | Freq: Once | CUTANEOUS | Status: AC
  Administered 2018-08-10: 6 via TOPICAL

## 2018-08-10 MED ORDER — GLYCOPYRROLATE 0.2 MG/ML IJ SOLN
INTRAMUSCULAR | Status: DC | PRN
  Administered 2018-08-10: 0.2 mg via INTRAVENOUS

## 2018-08-10 MED ORDER — PROPOFOL 10 MG/ML IV BOLUS
INTRAVENOUS | Status: DC | PRN
  Administered 2018-08-10: 110 mg via INTRAVENOUS

## 2018-08-10 MED ORDER — ACETAMINOPHEN 500 MG PO TABS
ORAL_TABLET | ORAL | Status: AC
  Filled 2018-08-10: qty 2

## 2018-08-10 MED ORDER — SUGAMMADEX SODIUM 200 MG/2ML IV SOLN
INTRAVENOUS | Status: DC | PRN
  Administered 2018-08-10: 150 mg via INTRAVENOUS

## 2018-08-10 MED ORDER — KETOROLAC TROMETHAMINE 30 MG/ML IJ SOLN
INTRAMUSCULAR | Status: DC | PRN
  Administered 2018-08-10: 30 mg via INTRAVENOUS

## 2018-08-10 MED ORDER — LIDOCAINE HCL (PF) 2 % IJ SOLN
INTRAMUSCULAR | Status: AC
  Filled 2018-08-10: qty 10

## 2018-08-10 MED ORDER — SODIUM CHLORIDE (PF) 0.9 % IJ SOLN
INTRAMUSCULAR | Status: AC
  Filled 2018-08-10: qty 50

## 2018-08-10 MED ORDER — FAMOTIDINE 20 MG PO TABS
ORAL_TABLET | ORAL | Status: AC
  Filled 2018-08-10: qty 1

## 2018-08-10 MED ORDER — ROCURONIUM BROMIDE 100 MG/10ML IV SOLN
INTRAVENOUS | Status: DC | PRN
  Administered 2018-08-10: 40 mg via INTRAVENOUS

## 2018-08-10 MED ORDER — FENTANYL CITRATE (PF) 100 MCG/2ML IJ SOLN
INTRAMUSCULAR | Status: AC
  Filled 2018-08-10: qty 2

## 2018-08-10 MED ORDER — LIDOCAINE HCL (CARDIAC) PF 100 MG/5ML IV SOSY
PREFILLED_SYRINGE | INTRAVENOUS | Status: DC | PRN
  Administered 2018-08-10: 60 mg via INTRAVENOUS

## 2018-08-10 MED ORDER — GABAPENTIN 300 MG PO CAPS
300.0000 mg | ORAL_CAPSULE | ORAL | Status: AC
  Administered 2018-08-10: 300 mg via ORAL

## 2018-08-10 MED ORDER — BUPIVACAINE LIPOSOME 1.3 % IJ SUSP: 20.0000 mL | Freq: Once | INTRAMUSCULAR | Status: DC

## 2018-08-10 MED ORDER — LIDOCAINE-EPINEPHRINE 1 %-1:100000 IJ SOLN
INTRAMUSCULAR | Status: AC
  Filled 2018-08-10: qty 1

## 2018-08-10 MED ORDER — LIDOCAINE-EPINEPHRINE 1 %-1:100000 IJ SOLN
INTRAMUSCULAR | Status: DC | PRN
  Administered 2018-08-10: 5 mL

## 2018-08-10 MED ORDER — LACTATED RINGERS IV SOLN
INTRAVENOUS | Status: DC
  Administered 2018-08-10: 09:00:00 via INTRAVENOUS

## 2018-08-10 MED FILL — IBUPROFEN 800 MG TABLET: 800 | 10 days supply | Qty: 30 | Fill #0

## 2018-08-10 SURGICAL SUPPLY — 30 items
"PENCIL ELECTRO HAND CTR " (MISCELLANEOUS) ×1 IMPLANT
ADH SKN CLS APL DERMABOND .7 (GAUZE/BANDAGES/DRESSINGS) ×1
BLADE SURG 15 STRL LF DISP TIS (BLADE) ×1 IMPLANT
BLADE SURG 15 STRL SS (BLADE) ×2
CHLORAPREP W/TINT 26ML (MISCELLANEOUS) ×2 IMPLANT
COVER WAND RF STERILE (DRAPES) IMPLANT
DECANTER SPIKE VIAL GLASS SM (MISCELLANEOUS) ×1 IMPLANT
DERMABOND ADVANCED (GAUZE/BANDAGES/DRESSINGS) ×1
DERMABOND ADVANCED .7 DNX12 (GAUZE/BANDAGES/DRESSINGS) ×1 IMPLANT
DRAPE LAPAROSCOPIC ABDOMINAL (DRAPES) IMPLANT
DRAPE LAPAROTOMY 77X122 PED (DRAPES) ×1 IMPLANT
DRAPE MAG INST 16X20 L/F (DRAPES) ×2 IMPLANT
ELECT CAUTERY BLADE TIP 2.5 (TIP) ×2
ELECTRODE CAUTERY BLDE TIP 2.5 (TIP) ×1 IMPLANT
GAUZE SPONGE 4X4 12PLY STRL (GAUZE/BANDAGES/DRESSINGS) IMPLANT
GLOVE BIO SURGEON STRL SZ 6.5 (GLOVE) ×2 IMPLANT
GLOVE BIOGEL PI IND STRL 7.0 (GLOVE) ×1 IMPLANT
GLOVE BIOGEL PI INDICATOR 7.0 (GLOVE) ×1
GOWN STRL REUS W/TWL LRG LVL3 (GOWN DISPOSABLE) ×4 IMPLANT
KIT BASIN OR (CUSTOM PROCEDURE TRAY) ×2 IMPLANT
NDL HYPO 25X1 1.5 SAFETY (NEEDLE) ×1 IMPLANT
NEEDLE HYPO 25X1 1.5 SAFETY (NEEDLE) ×2 IMPLANT
PACK BASIN MINOR ARMC (MISCELLANEOUS) ×2 IMPLANT
PENCIL ELECTRO HAND CTR (MISCELLANEOUS) ×1 IMPLANT
SPONGE LAP 18X18 RF (DISPOSABLE) ×1 IMPLANT
STRIP CLOSURE SKIN 1/2X4 (GAUZE/BANDAGES/DRESSINGS) IMPLANT
SUT MNCRL AB 4-0 PS2 18 (SUTURE) ×2 IMPLANT
SUT VIC AB 3-0 SH 27 (SUTURE) ×2
SUT VIC AB 3-0 SH 27X BRD (SUTURE) ×1 IMPLANT
SYR 10ML LL (SYRINGE) ×2 IMPLANT

## 2018-08-10 NOTE — Anesthesia Procedure Notes (Signed)
Procedure Name: Intubation Date/Time: 08/10/2018 9:53 AM Performed by: Eben Burow, CRNA Pre-anesthesia Checklist: Patient identified, Emergency Drugs available, Suction available and Patient being monitored Patient Re-evaluated:Patient Re-evaluated prior to induction Oxygen Delivery Method: Circle system utilized Preoxygenation: Pre-oxygenation with 100% oxygen Induction Type: IV induction Ventilation: Mask ventilation without difficulty Laryngoscope Size: Mac and 3 Grade View: Grade I Tube type: Oral Tube size: 7.0 mm Number of attempts: 1 Airway Equipment and Method: Stylet and LTA kit utilized Placement Confirmation: ETT inserted through vocal cords under direct vision,  positive ETCO2 and breath sounds checked- equal and bilateral Secured at: 21 cm Tube secured with: Tape Dental Injury: Teeth and Oropharynx as per pre-operative assessment

## 2018-08-10 NOTE — Anesthesia Preprocedure Evaluation (Signed)
Anesthesia Evaluation  Patient identified by MRN, date of birth, ID band Patient awake    Reviewed: Allergy & Precautions, NPO status , Patient's Chart, lab work & pertinent test results  Airway Mallampati: II  TM Distance: >3 FB     Dental   Pulmonary Current Smoker,    Pulmonary exam normal        Cardiovascular negative cardio ROS Normal cardiovascular exam     Neuro/Psych PSYCHIATRIC DISORDERS Anxiety Depression negative neurological ROS     GI/Hepatic Neg liver ROS, hiatal hernia, PUD, GERD  ,  Endo/Other  negative endocrine ROS  Renal/GU negative Renal ROS  negative genitourinary   Musculoskeletal  (+) Arthritis , Osteoarthritis,    Abdominal Normal abdominal exam  (+)   Peds negative pediatric ROS (+)  Hematology negative hematology ROS (+)   Anesthesia Other Findings   Reproductive/Obstetrics                             Anesthesia Physical Anesthesia Plan  ASA: II  Anesthesia Plan: General   Post-op Pain Management:    Induction: Intravenous  PONV Risk Score and Plan:   Airway Management Planned: Oral ETT  Additional Equipment:   Intra-op Plan:   Post-operative Plan: Extubation in OR  Informed Consent: I have reviewed the patients History and Physical, chart, labs and discussed the procedure including the risks, benefits and alternatives for the proposed anesthesia with the patient or authorized representative who has indicated his/her understanding and acceptance.     Dental advisory given  Plan Discussed with: CRNA and Surgeon  Anesthesia Plan Comments:         Anesthesia Quick Evaluation

## 2018-08-10 NOTE — Anesthesia Post-op Follow-up Note (Signed)
Anesthesia QCDR form completed.        

## 2018-08-10 NOTE — Transfer of Care (Signed)
Immediate Anesthesia Transfer of Care Note  Patient: Natalie Thornton  Procedure(s) Performed: EXCISION LIPOMA, RIGHT (Right )  Patient Location: PACU  Anesthesia Type:General  Level of Consciousness: drowsy  Airway & Oxygen Therapy: Patient Spontanous Breathing and Patient connected to face mask oxygen  Post-op Assessment: Report given to RN and Post -op Vital signs reviewed and stable  Post vital signs: Reviewed and stable  Last Vitals:  Vitals Value Taken Time  BP 136/85 08/10/2018 11:04 AM  Temp 36.1 C 08/10/2018 11:04 AM  Pulse 96 08/10/2018 11:05 AM  Resp 12 08/10/2018 11:05 AM  SpO2 100 % 08/10/2018 11:05 AM  Vitals shown include unvalidated device data.  Last Pain:  Vitals:   08/10/18 1104  TempSrc:   PainSc: Asleep         Complications: No apparent anesthesia complications

## 2018-08-10 NOTE — Discharge Instructions (Signed)
AMBULATORY SURGERY  DISCHARGE INSTRUCTIONS   1) The drugs that you were given will stay in your system until tomorrow so for the next 24 hours you should not:  A) Drive an automobile B) Make any legal decisions C) Drink any alcoholic beverage   2) You may resume regular meals tomorrow.  Today it is better to start with liquids and gradually work up to solid foods.  You may eat anything you prefer, but it is better to start with liquids, then soup and crackers, and gradually work up to solid foods.   3) Please notify your doctor immediately if you have any unusual bleeding, trouble breathing, redness and pain at the surgery site, drainage, fever, or pain not relieved by medication.    4) Additional Instructions:        Please contact your physician with any problems or Same Day Surgery at [phone], Monday through Friday 6 am to 4 pm, or [place] at [facility] number at [phone].Lipoma Removal, Care After Refer to this sheet in the next few weeks. These instructions provide you with information about caring for yourself after your procedure. Your health care provider may also give you more specific instructions. Your treatment has been planned according to current medical practices, but problems sometimes occur. Call your health care provider if you have any problems or questions after your procedure. What can I expect after the procedure? After the procedure, it is common to have:  Mild pain.  Swelling.  Bruising. Follow these instructions at home:  Bathing  Do not take baths, swim, or use a hot tub until your health care provider approves. Ask your health care provider if you can take showers. You may only be allowed to take sponge baths for bathing.  Keep your bandage (dressing) dry until your health care provider says it can be removed. Incision care   Follow instructions from your health care provider about how to take care of your incision. Make sure  you: ? Wash your hands with soap and water before you change your bandage (dressing). If soap and water are not available, use hand sanitizer. ? Change your dressing as told by your health care provider. ? Leave stitches (sutures), skin glue, or adhesive strips in place. These skin closures may need to stay in place for 2 weeks or longer. If adhesive strip edges start to loosen and curl up, you may trim the loose edges. Do not remove adhesive strips completely unless your health care provider tells you to do that.  Check your incision area every day for signs of infection. Check for: ? More redness, swelling, or pain. ? Fluid or blood. ? Warmth. ? Pus or a bad smell. Driving  Do not drive or operate heavy machinery while taking prescription pain medicine.  Do not drive for 24 hours if you received a medicine to help you relax (sedative) during your procedure.  Ask your health care provider when it is safe for you to drive. General instructions  Take over-the-counter and prescription medicines only as told by your health care provider.  Do not use any tobacco products, such as cigarettes, chewing tobacco, and e-cigarettes. These can delay healing. If you need help quitting, ask your health care provider.  Return to your normal activities as told by your health care provider. Ask your health care provider what activities are safe for you.  Keep all follow-up visits as told by your health care provider. This is important. Contact a health care provider  if:  You have more redness, swelling, or pain around your incision.  You have fluid or blood coming from your incision.  Your incision feels warm to the touch.  You have pus or a bad smell coming from your incision.  You have pain that does not get better with medicine. Get help right away if:  You have chills or a fever.  You have severe pain. This information is not intended to replace advice given to you by your health care  provider. Make sure you discuss any questions you have with your health care provider. Document Released: 08/06/2015 Document Revised: 11/03/2015 Document Reviewed: 08/06/2015 Elsevier Interactive Patient Education  2019 [employer].

## 2018-08-10 NOTE — Interval H&P Note (Signed)
History and Physical Interval Note:  08/10/2018 9:28 AM  Natalie Thornton  has presented today for surgery, with the diagnosis of LIPOMA  The various methods of treatment have been discussed with the patient and family. After consideration of risks, benefits and other options for treatment, the patient has consented to  Procedure(s): EXCISION LIPOMA, RIGHT (Right) as a surgical intervention .  The patient's history has been reviewed, patient examined, no change in status, stable for surgery.  I have reviewed the patient's chart and labs.  Questions were answered to the patient's satisfaction.     Fredirick Maudlin

## 2018-08-10 NOTE — Anesthesia Postprocedure Evaluation (Signed)
Anesthesia Post Note  Patient: Natalie Thornton  Procedure(s) Performed: EXCISION LIPOMA, RIGHT (Right )  Patient location during evaluation: PACU Anesthesia Type: General Level of consciousness: awake and alert and oriented Pain management: pain level controlled Vital Signs Assessment: post-procedure vital signs reviewed and stable Respiratory status: spontaneous breathing Cardiovascular status: blood pressure returned to baseline Anesthetic complications: no     Last Vitals:  Vitals:   08/10/18 1149 08/10/18 1157  BP: 131/87 133/79  Pulse: 93 79  Resp: 20 20  Temp: (!) 36.3 C (!) 36.1 C  SpO2: 100% 100%    Last Pain:  Vitals:   08/10/18 1157  TempSrc: Oral  PainSc: 0-No pain                 Patryck Kilgore

## 2018-08-10 NOTE — Op Note (Signed)
Pre-op Dx: Lipoma of right upper back  Post-op Dx: Same, final measurements: 6.5 cm x 4.5 cm x 1.5 cm  Operation: Excision of right upper back lipoma  Surgeon: Fredirick Maudlin MD  Anesthesia: GETA  Findings: A well-circumscribed lobular fat mass, consistent with the preoperative diagnosis  Indications: The patient is a 33 year old female who has noticed a soft tissue mass on her right upper back.  It has grown over the past couple of years and is starting to cause irritation and friction with her bra.  She desires surgical resection.  The risks of the procedure were discussed with her and she agreed to proceed.  Procedure in detail: The patient was identified in the preoperative holding area where she was marked.  She was then brought to the operating room and placed supine on the OR table.  Bony prominences were padded and bilateral sequential compression devices were placed on the lower extremities.  General endotracheal anesthesia was induced without incident.  She was then turned into a left lateral decubitus position.  Once again, care was taken to pad all bony prominences appropriately.  An axillary roll was placed and her body was supported on large gel roll.  Her left arm was supported on multiple pillows.  She was then sterilely prepped and draped in standard fashion.  A timeout was performed confirming the patient's identity, the procedure being performed, her allergies, all necessary equipment was available, and that maintenance anesthesia was adequate.  A perioperative dose of antibiotics was administered.  After infiltrating the skin and subcutaneum with local anesthetic, an incision was made following Langer's lines overlying the mass.  Electrocautery was used to dissect down through the the dermis to expose the mass.  The mass was grasped with an Allis clamp and a Weitlaner retractor was placed.  Electrocautery was utilized to circumferentially dissect the mass away from the  surrounding tissues and off of the underlying fascia.  It was completely resected.  Final measurements were 6.5 x 4.5 x 1.5 cm.  It was then handed off as a specimen.  The wound was irrigated and then closed in 2 layers.  The deep dermal layer of 3-0 Vicryl also incorporated bites of the fascia to close the potential space and try to prevent seroma formation.  The skin was closed with running subcuticular Monocryl.  The skin was cleaned and Dermabond and Steri-Strips were applied.  Patient was then turned supine, awakened, extubated, and taken to the postanesthesia care unit in good condition.  EBL: <5 cc  IVF: See anesthesia record  Specimen: Lipoma to pathology  Complications: None immediately apparent  Counts: All needles, instruments, and sponges were counted and reported to be correct number at the end of the operation.  Disposition: Stable to the postanesthesia care unit with subsequent discharge to home.

## 2018-08-10 NOTE — Telephone Encounter (Signed)
Patient is calling and said she just left the hospital from surgery with Dr. Celine Ahr and her pharmacy does not fill her rx. The Rx would need to be sent to another pharmacy patient lives in [place]. Please call and advise.

## 2018-08-13 LAB — SURGICAL PATHOLOGY

## 2018-08-13 NOTE — Telephone Encounter (Signed)
Patient states she got this taking care of.

## 2018-08-15 ENCOUNTER — Encounter: Payer: Self-pay | Admitting: General Surgery

## 2018-08-16 ENCOUNTER — Telehealth: Payer: Self-pay | Admitting: General Surgery

## 2018-08-16 NOTE — Telephone Encounter (Signed)
Talk to patient and she states that she will get someone to look at the area to see if the area is red. She states she has been rubbing the dressing because it itches. No fever or vomiting. Patient to call back and report.

## 2018-08-16 NOTE — Telephone Encounter (Signed)
Patient is calling and said incision site is itching, redness around it may be due  to the patient itching at it, slightly swollen.please call patient and advise.

## 2018-08-17 ENCOUNTER — Ambulatory Visit: Payer: BLUE CROSS/BLUE SHIELD | Admitting: Physical Medicine & Rehabilitation

## 2018-08-22 ENCOUNTER — Encounter: Payer: BLUE CROSS/BLUE SHIELD | Admitting: General Surgery

## 2018-08-26 ENCOUNTER — Encounter (HOSPITAL_COMMUNITY): Payer: Self-pay | Admitting: *Deleted

## 2018-08-26 ENCOUNTER — Other Ambulatory Visit: Payer: Self-pay

## 2018-08-26 ENCOUNTER — Ambulatory Visit (HOSPITAL_COMMUNITY)
Admission: EM | Admit: 2018-08-26 | Discharge: 2018-08-26 | Disposition: A | Discharge status: Home / Self Care | Payer: BLUE CROSS/BLUE SHIELD | Attending: Emergency Medicine | Admitting: Emergency Medicine

## 2018-08-26 DIAGNOSIS — R52 Pain, unspecified: Secondary | ICD-10-CM

## 2018-08-26 LAB — POCT URINALYSIS DIP (DEVICE)
Bilirubin Urine: NEGATIVE
Glucose, UA: NEGATIVE mg/dL
Hgb urine dipstick: NEGATIVE
KETONES UR: NEGATIVE mg/dL
Leukocytes,Ua: NEGATIVE
Nitrite: NEGATIVE
Protein, ur: NEGATIVE mg/dL
Specific Gravity, Urine: 1.015 (ref 1.005–1.030)
Urobilinogen, UA: 0.2 mg/dL (ref 0.0–1.0)
pH: 7.5 (ref 5.0–8.0)

## 2018-08-26 LAB — POCT PREGNANCY, URINE: Preg Test, Ur: NEGATIVE

## 2018-08-26 MED ORDER — IBUPROFEN 800 MG PO TABS: 800.0000 mg | ORAL_TABLET | Freq: Three times a day (TID) | ORAL | 0 refills | Status: DC

## 2018-08-26 NOTE — ED Provider Notes (Signed)
[place]    CSN: [id number] Arrival date & time: 08/26/18  1625     History   Chief Complaint Chief Complaint  Patient presents with  . Generalized Body Aches  . Cough    HPI Natalie Thornton is a 33 y.o. female.   Natalie Thornton presents with complaints of generalized body aches for the past two days. States had cough, fever and congestion 3/16 and 3/17 which have improved. Minimal cough now. Two days ago developed generalized body aches. They have improved since started but still present. Chest wall, extremities and neck are all included. No sore throat. No ear pain. No shortness of breath . No rash. No shortness of breath . Smokes 3-4 black and mild's a day. Felt some pelvic pressure today with urination but no burning with urination and no frequency with urination. No known ill contacts. Took ibuprofen 200mg  this am which didn't help at all. Had a loose stool today but states drank alcohol yesterday which she is attributing to stool. Hx of anxiety, arthritis, ddd, h pylori, pilonidal cyst. Denies asthma history. Denies any recent heavy activity or gym/work out.     ROS per HPI, negative if not otherwise mentioned.      Past Medical History:  Diagnosis Date  . Anxiety   . Arthritis   . DDD (degenerative disc disease)   . Degenerative disc disease, cervical   . Disc disease, degenerative, cervical   . H pylori ulcer   . H/O degenerative disc disease 2013  . History of hiatal hernia   . Pilonidal cyst     Patient Active Problem List   Diagnosis Date Noted  . Lipoma of back   . Chronic bilateral thoracic back pain 07/17/2018  . DDD (degenerative disc disease), lumbar 06/14/2018  . Diabetes mellitus screening 01/06/2017  . Tobacco abuse 01/06/2017  . Anxiety and depression 01/06/2017  . Dyspnea on exertion 10/20/2016  . Cigarette smoker 10/20/2016  . Solitary pulmonary nodule 10/20/2016  . Acute contact dermatitis 10/20/2016  . Pilonidal sinus 07/05/2011     Past Surgical History:  Procedure Laterality Date  . COLONOSCOPY    . LIPOMA EXCISION Right 08/10/2018   Procedure: EXCISION LIPOMA, RIGHT;  Surgeon: Fredirick Maudlin, MD;  Location: [facility] ORS;  Service: General;  Laterality: Right;    OB History   No obstetric history on file.      Home Medications    Prior to Admission medications   Medication Sig Start Date End Date Taking? Authorizing Provider  HYDROcodone-acetaminophen (NORCO) 5-325 MG tablet Take 1 tablet by mouth every 6 (six) hours as needed for moderate pain. 08/10/18   Fredirick Maudlin, MD  ibuprofen (ADVIL,MOTRIN) 800 MG tablet Take 1 tablet (800 mg total) by mouth 3 (three) times daily. 08/26/18   Zigmund Gottron, NP    Family History Family History  Problem Relation Age of Onset  . AAA (abdominal aortic aneurysm) Maternal Grandmother   . Leukemia Maternal Grandmother   . Breast cancer Maternal Grandmother   . Head & neck cancer Paternal Grandmother        Back cancer  . Lymphoma Paternal Grandmother   . Cirrhosis Maternal Uncle   . Breast cancer Other   . Colon cancer Neg Hx   . Esophageal cancer Neg Hx   . Rectal cancer Neg Hx   . Stomach cancer Neg Hx     Social History Social History   Tobacco Use  . Smoking status: Current Every Day Smoker  Packs/day: 5.00    Years: 10.00    Pack years: 50.00    Types: Cigars  . Smokeless tobacco: Never Used  . Tobacco comment: BLACK AND MILDS  Substance Use Topics  . Alcohol use: Yes    Comment: occasionally  . Drug use: No     Allergies   Patient has no known allergies.   Review of Systems Review of Systems   Physical Exam Triage Vital Signs ED Triage Vitals  Enc Vitals Group     BP 08/26/18 1633 140/81     Pulse Rate 08/26/18 1633 72     Resp 08/26/18 1633 16     Temp 08/26/18 1633 98.6 F (37 C)     Temp Source 08/26/18 1633 Oral     SpO2 08/26/18 1633 100 %     Weight --      Height --      Head Circumference --      Peak Flow --       Pain Score 08/26/18 1635 10     Pain Loc --      Pain Edu? --      Excl. in [place]? --    No data found.  Updated Vital Signs BP 140/81 (BP Location: Left Arm)   Pulse 72   Temp 98.6 F (37 C) (Oral)   Resp 16   LMP 08/09/2018 (Approximate)   SpO2 100%    Physical Exam Constitutional:      General: She is not in acute distress.    Appearance: She is well-developed.  HENT:     Head: Normocephalic and atraumatic.     Right Ear: Tympanic membrane, ear canal and external ear normal.     Left Ear: Tympanic membrane, ear canal and external ear normal.     Nose: Nose normal.     Mouth/Throat:     Pharynx: Uvula midline.     Tonsils: No tonsillar exudate.  Eyes:     Conjunctiva/sclera: Conjunctivae normal.     Pupils: Pupils are equal, round, and reactive to light.  Neck:     Musculoskeletal: Full passive range of motion without pain.  Cardiovascular:     Rate and Rhythm: Normal rate and regular rhythm.     Heart sounds: Normal heart sounds.  Pulmonary:     Effort: Pulmonary effort is normal.     Breath sounds: Normal breath sounds.  Abdominal:     Tenderness: There is abdominal tenderness in the suprapubic area. There is no right CVA tenderness, left CVA tenderness, guarding or rebound. Negative signs include Murphy's sign, Rovsing's sign, McBurney's sign, psoas sign and obturator sign.     Comments: Mild subjective "pressure" on palpation to generalized low abdomen   Skin:    General: Skin is warm and dry.  Neurological:     Mental Status: She is alert and oriented to person, place, and time.      UC Treatments / Results  Labs (all labs ordered are listed, but only abnormal results are displayed) Labs Reviewed  POC URINE PREG, ED  POCT URINALYSIS DIP (DEVICE)  POCT PREGNANCY, URINE    EKG None  Radiology No results found.  Procedures Procedures (including critical care time)  Medications Ordered in UC Medications - No data to display  Initial  Impression / Assessment and Plan / UC Course  I have reviewed the triage vital signs and the nursing notes.  Pertinent labs & imaging results that were available during my care of the  patient were reviewed by me and considered in my medical decision making (see chart for details).     Non toxic in appearance. Afebrile. No red flag finding on exam. Viral illness last week with resulting body aches? Question possible influenza. Symptoms have improved since onset, even was drinking alcohol yesterday. Moving all extremities without difficulty. Declined toradol in clinic today. Encouraged rest, fluids, nsaids as needed. Return precautions provided. Patient verbalized understanding and agreeable to plan.  Ambulatory out of clinic without difficulty.    Final Clinical Impressions(s) / UC Diagnoses   Final diagnoses:  Generalized body aches     Discharge Instructions     Ibuprofen as needed for body aches.  Rest.  Plenty of fluids.  If worsening of symptoms, swelling, redness, increased pain, return of fever or otherwise worsening please return or go to the ER.     ED Prescriptions    Medication Sig Dispense Auth. Provider   ibuprofen (ADVIL,MOTRIN) 800 MG tablet Take 1 tablet (800 mg total) by mouth 3 (three) times daily. 21 tablet Zigmund Gottron, NP     Controlled Substance Prescriptions  Controlled Substance Registry consulted? Not Applicable   Zigmund Gottron, NP 08/26/18 [phone]

## 2018-08-26 NOTE — Discharge Instructions (Addendum)
Ibuprofen as needed for body aches.  Rest.  Plenty of fluids.  If worsening of symptoms, swelling, redness, increased pain, return of fever or otherwise worsening please return or go to the ER.

## 2018-08-26 NOTE — ED Triage Notes (Signed)
Reports having fever and cough last week; fever has resolved, but now having body aches.

## 2018-08-27 ENCOUNTER — Encounter: Payer: BLUE CROSS/BLUE SHIELD | Admitting: General Surgery

## 2018-08-28 ENCOUNTER — Ambulatory Visit: Payer: BLUE CROSS/BLUE SHIELD | Admitting: Physical Medicine & Rehabilitation

## 2018-08-29 ENCOUNTER — Ambulatory Visit: Payer: BLUE CROSS/BLUE SHIELD | Admitting: Nurse Practitioner

## 2018-08-29 ENCOUNTER — Ambulatory Visit: Payer: BLUE CROSS/BLUE SHIELD | Attending: Nurse Practitioner | Admitting: Nurse Practitioner

## 2018-08-29 ENCOUNTER — Other Ambulatory Visit: Payer: Self-pay

## 2018-08-29 DIAGNOSIS — R0781 Pleurodynia: Secondary | ICD-10-CM

## 2018-08-29 DIAGNOSIS — N644 Mastodynia: Secondary | ICD-10-CM

## 2018-08-29 MED ORDER — CYCLOBENZAPRINE HCL 5 MG PO TABS: 5.0000 mg | ORAL_TABLET | Freq: Three times a day (TID) | ORAL | 1 refills | Status: DC | PRN

## 2018-08-29 MED ORDER — AZITHROMYCIN 250 MG PO TABS: ORAL_TABLET | ORAL | 0 refills | Status: DC

## 2018-08-29 NOTE — Progress Notes (Signed)
TELEHEALTH    Right rib cage pain. Going on for 3 weeks. She endorses a temperature of 100 last week along with shortness of breath and cough along with fatigue. Denies any other URI symptoms or chest pain. Aggravating factors: lying on right side. There has not no injury or trauma. As she is a smoker and has history of CAP. Will treat prophylactically for PNA today. She does state her pain is located near the area in which she had her lipoma recently extracted. If right rib cage pain does not resolve with abx, tylenol and motrin will likely need to be re evaluated by general surgery. She has tried a heating pad which does provide some relief of her pain.    Left breast pain  She has been evaluated for left breast pain and palpable lump of the left breast in the past. States when she went to have the [place] performed they did not [place] the correct area. Pain in the breast comes and goes. Pain is not related to her menstrual cycles or pms.  Ibuprofen and oxycodone do not help relieve her pain.

## 2018-09-04 ENCOUNTER — Telehealth: Payer: Self-pay | Admitting: Internal Medicine

## 2018-09-04 ENCOUNTER — Other Ambulatory Visit: Payer: Self-pay | Admitting: Nurse Practitioner

## 2018-09-04 DIAGNOSIS — N644 Mastodynia: Secondary | ICD-10-CM

## 2018-09-04 NOTE — Telephone Encounter (Signed)
Pt called in stated that she was reffered out to [facility] but they didn't their insurance would like to be sen to [facility] [address] [place] please follow up

## 2018-09-06 ENCOUNTER — Telehealth: Payer: Self-pay | Admitting: Internal Medicine

## 2018-09-06 ENCOUNTER — Telehealth: Payer: BLUE CROSS/BLUE SHIELD | Admitting: Family

## 2018-09-06 ENCOUNTER — Telehealth: Payer: Self-pay | Admitting: *Deleted

## 2018-09-06 ENCOUNTER — Other Ambulatory Visit: Payer: Self-pay | Admitting: Nurse Practitioner

## 2018-09-06 DIAGNOSIS — R05 Cough: Secondary | ICD-10-CM

## 2018-09-06 DIAGNOSIS — R059 Cough, unspecified: Secondary | ICD-10-CM

## 2018-09-06 DIAGNOSIS — N644 Mastodynia: Secondary | ICD-10-CM

## 2018-09-06 MED ORDER — PROMETHAZINE-DM 6.25-15 MG/5ML PO SYRP: 5.0000 mL | ORAL_SOLUTION | Freq: Four times a day (QID) | ORAL | 0 refills | Status: DC | PRN

## 2018-09-06 NOTE — Telephone Encounter (Signed)
appt make for patient on 09/12/2018 with Dr. Celine Ahr. Advised The patient is aware to choice (Advil or Aleve) as needed for comfort. The patient is aware to use a heating pad as needed for comfort.

## 2018-09-06 NOTE — Telephone Encounter (Signed)
Pt called in stated they have coughing symptoms Pt has not traveled in last month  Has not been in contact with someone with covid-19 to their knowledge  Pt stated they had a fever in last week and extremely bad body aches and cough which are still present but have improved in the last two days, lately has been experiencing fast heart beats but no difficulty breathing or chest pains pt does state they work in [employer] and started feeling sick from work  Please follow up

## 2018-09-06 NOTE — Progress Notes (Signed)
I understand your concern. If you develop worsening shortness of breath or chest pain, please go to the emergency department for treatment. We do not have the ability to treat aggressively or treat symptoms that are potentially emergent through e-visit.  Outpatient testing is also not an option through evisit.

## 2018-09-06 NOTE — Telephone Encounter (Signed)
Patient called and stated that she is having some mild pain in the area where the lipoma was removed on 08/10/18. Patient wants to know if that is normal and does she need to come in to follow up with Dr.Cannon

## 2018-09-06 NOTE — Telephone Encounter (Signed)
Patient called with concern for a need to be tested for COVID-19.  Patient at this time does not meet the requirements of a fever greater than 100.4, a cough without production, and shortness of breath.  Patient advised that COVID-19 testing is not warranted.    Patient told that the only testing site was Urgent Care or Emergency Room

## 2018-09-06 NOTE — Progress Notes (Signed)
E-Visit for Corona Virus Screening  Based on your current symptoms, you may very well have the virus, however your symptoms are mild. Currently, not all patients are being tested. If the symptoms are mild and there is not a known exposure, performing the test is not indicated.  Coronavirus disease 2019 (COVID-19) is a respiratory illness that can spread from person to person. The virus that causes COVID-19 is a new virus that was first identified in the country of [place] but is now found in multiple other countries and has spread to the [place].  Symptoms associated with the virus are mild to severe fever, cough, and shortness of breath. There is currently no vaccine to protect against COVID-19, and there is no specific antiviral treatment for the virus.   To be considered HIGH RISK for Coronavirus (COVID-19), you have to meet the following criteria:  . Traveled to [place], [place], [place], [place] or [place]; or in the [place] to [place], [place], [place], or [place]; and have fever, cough, and shortness of breath within the last 2 weeks of travel OR  . Been in close contact with a person diagnosed with COVID-19 within the last 2 weeks and have fever, cough, and shortness of breath  . IF YOU DO NOT MEET THESE CRITERIA, YOU ARE CONSIDERED LOW RISK FOR COVID-19.   It is vitally important that if you feel that you have an infection such as this virus or any other virus that you stay home and away from places where you may spread it to others.  You should self-quarantine for 14 days if you have symptoms that could potentially be coronavirus and avoid contact with people age 65 and older.   You can use medication such as A prescription cough medication called Phenergan DM 6.25 mg/15 mg. You make take one teaspoon / 5 ml every 4-6 hours as needed for cough  You may also take acetaminophen (Tylenol) as needed for fever.   Reduce your risk of any infection by using the same precautions  used for avoiding the common cold or flu:  . Wash your hands often with soap and warm water for at least 20 seconds.  If soap and water are not readily available, use an alcohol-based hand sanitizer with at least 60% alcohol.  . If coughing or sneezing, cover your mouth and nose by coughing or sneezing into the elbow areas of your shirt or coat, into a tissue or into your sleeve (not your hands). . Avoid shaking hands with others and consider head nods or verbal greetings only. . Avoid touching your eyes, nose, or mouth with unwashed hands.  . Avoid close contact with people who are sick. . Avoid places or events with large numbers of people in one location, like concerts or sporting events. . Carefully consider travel plans you have or are making. . If you are planning any travel outside or inside the [place], visit the CDC's Travelers' Health webpage for the latest health notices. . If you have some symptoms but not all symptoms, continue to monitor at home and seek medical attention if your symptoms worsen. . If you are having a medical emergency, call 911.  HOME CARE . Only take medications as instructed by your medical team. . Drink plenty of fluids and get plenty of rest. . A steam or ultrasonic humidifier can help if you have congestion.   GET HELP RIGHT AWAY IF: . You develop worsening fever. . You become short of   breath . You cough up blood. . Your symptoms become more severe MAKE SURE YOU   Understand these instructions.  Will watch your condition.  Will get help right away if you are not doing well or get worse.  Your e-visit answers were reviewed by a board certified advanced clinical practitioner to complete your personal care plan.  Depending on the condition, your plan could have included both over the counter or prescription medications.  If there is a problem please reply once you have received a response from your provider. Your safety is important to us.  If you have drug  allergies check your prescription carefully.    You can use MyChart to ask questions about today's visit, request a non-urgent call back, or ask for a work or school excuse for 24 hours related to this e-Visit. If it has been greater than 24 hours you will need to follow up with your provider, or enter a new e-Visit to address those concerns. You will get an e-mail in the next two days asking about your experience.  I hope that your e-visit has been valuable and will speed your recovery. Thank you for using e-visits.    

## 2018-09-08 ENCOUNTER — Encounter (HOSPITAL_COMMUNITY): Payer: Self-pay | Admitting: Emergency Medicine

## 2018-09-08 ENCOUNTER — Other Ambulatory Visit: Payer: Self-pay

## 2018-09-08 ENCOUNTER — Ambulatory Visit (HOSPITAL_COMMUNITY)
Admission: EM | Admit: 2018-09-08 | Discharge: 2018-09-08 | Disposition: A | Discharge status: Home / Self Care | Payer: BLUE CROSS/BLUE SHIELD | Attending: Family Medicine | Admitting: Family Medicine

## 2018-09-08 DIAGNOSIS — R0789 Other chest pain: Secondary | ICD-10-CM | POA: Diagnosis not present

## 2018-09-08 DIAGNOSIS — J209 Acute bronchitis, unspecified: Secondary | ICD-10-CM | POA: Diagnosis not present

## 2018-09-08 MED ORDER — PREDNISONE 50 MG PO TABS: ORAL_TABLET | ORAL | 0 refills | Status: DC

## 2018-09-08 NOTE — Discharge Instructions (Addendum)
The discomfort that you feel is most likely inflammation in the bronchial tubes and should subside slowly over 1-2 weeks.

## 2018-09-08 NOTE — ED Provider Notes (Signed)
[place]    CSN: [id number] Arrival date & time: 09/08/18  1633     History   Chief Complaint Chief Complaint  Patient presents with  . Chest Pain  . Shortness of Breath    HPI Natalie Thornton is a 33 y.o. female.    This is a 33 year old woman who returns to the [facility] urgent care for reevaluation of generalized body aches which began on August 24, 2018.  She denies cough and fever at this time.  He myalgias have subsided but she is bothered by substernal aching, unrelated to movement or deep breath, and some "shallow breathing."  She says her inhaler and ibuprofen are not working.  Patient works at a [employer].   Note from 08/26/18: Maziah presents with complaints of generalized body aches for the past two days. States had cough, fever and congestion 3/16 and 3/17 which have improved. Minimal cough now. Two days ago developed generalized body aches. They have improved since started but still present. Chest wall, extremities and neck are all included. No sore throat. No ear pain. No shortness of breath . No rash. No shortness of breath . Smokes 3-4 black and mild's a day. Felt some pelvic pressure today with urination but no burning with urination and no frequency with urination. No known ill contacts. Took ibuprofen 200mg  this am which didn't help at all. Had a loose stool today but states drank alcohol yesterday which she is attributing to stool. Hx of anxiety, arthritis, ddd, h pylori, pilonidal cyst. Denies asthma history. Denies any recent heavy activity or gym/work out.      Past Medical History:  Diagnosis Date  . Anxiety   . Arthritis   . DDD (degenerative disc disease)   . Degenerative disc disease, cervical   . Disc disease, degenerative, cervical   . H pylori ulcer   . H/O degenerative disc disease 2013  . History of hiatal hernia   . Pilonidal cyst     Patient Active Problem List   Diagnosis Date Noted  . Lipoma of back   . Chronic  bilateral thoracic back pain 07/17/2018  . DDD (degenerative disc disease), lumbar 06/14/2018  . Diabetes mellitus screening 01/06/2017  . Tobacco abuse 01/06/2017  . Anxiety and depression 01/06/2017  . Dyspnea on exertion 10/20/2016  . Cigarette smoker 10/20/2016  . Solitary pulmonary nodule 10/20/2016  . Acute contact dermatitis 10/20/2016  . Pilonidal sinus 07/05/2011    Past Surgical History:  Procedure Laterality Date  . COLONOSCOPY    . LIPOMA EXCISION Right 08/10/2018   Procedure: EXCISION LIPOMA, RIGHT;  Surgeon: Fredirick Maudlin, MD;  Location: [facility] ORS;  Service: General;  Laterality: Right;    OB History   No obstetric history on file.      Home Medications    Prior to Admission medications   Medication Sig Start Date End Date Taking? Authorizing Provider  predniSONE (DELTASONE) 50 MG tablet One daily with food 09/08/18   Robyn Haber, MD    Family History Family History  Problem Relation Age of Onset  . AAA (abdominal aortic aneurysm) Maternal Grandmother   . Leukemia Maternal Grandmother   . Breast cancer Maternal Grandmother   . Head & neck cancer Paternal Grandmother        Back cancer  . Lymphoma Paternal Grandmother   . Cirrhosis Maternal Uncle   . Breast cancer Other   . Colon cancer Neg Hx   . Esophageal cancer Neg Hx   .  Rectal cancer Neg Hx   . Stomach cancer Neg Hx     Social History Social History   Tobacco Use  . Smoking status: Current Every Day Smoker    Packs/day: 5.00    Years: 10.00    Pack years: 50.00    Types: Cigars  . Smokeless tobacco: Never Used  . Tobacco comment: BLACK AND MILDS  Substance Use Topics  . Alcohol use: Yes    Comment: occasionally  . Drug use: No     Allergies   Patient has no known allergies.   Review of Systems Review of Systems  Respiratory: Positive for chest tightness and shortness of breath.   Musculoskeletal: Positive for myalgias.  All other systems reviewed and are negative.     Physical Exam Triage Vital Signs ED Triage Vitals  Enc Vitals Group     BP      Pulse      Resp      Temp      Temp src      SpO2      Weight      Height      Head Circumference      Peak Flow      Pain Score      Pain Loc      Pain Edu?      Excl. in [place]?    No data found.  Updated Vital Signs BP 133/66 (BP Location: Left Arm)   Pulse (!) 110   Temp 99.7 F (37.6 C) (Oral)   Resp 18   LMP 08/09/2018 (Approximate)   SpO2 100%    Physical Exam Vitals signs and nursing note reviewed.  Constitutional:      Appearance: She is well-developed.  HENT:     Head: Normocephalic.     Jaw: There is normal jaw occlusion.     Salivary Glands: Right salivary gland is not diffusely enlarged. Left salivary gland is not diffusely enlarged.     Nose: Nose normal.     Mouth/Throat:     Mouth: Mucous membranes are moist.     Pharynx: Oropharynx is clear.  Eyes:     Conjunctiva/sclera: Conjunctivae normal.  Neck:     Musculoskeletal: Normal range of motion and neck supple.  Cardiovascular:     Rate and Rhythm: Normal rate.     Heart sounds: Normal heart sounds.  Pulmonary:     Effort: Pulmonary effort is normal.     Breath sounds: Normal breath sounds.  Chest:     Chest wall: No tenderness.  Musculoskeletal: Normal range of motion.  Skin:    General: Skin is warm and dry.  Neurological:     General: No focal deficit present.     Mental Status: She is alert.  Psychiatric:        Mood and Affect: Mood normal.      UC Treatments / Results  Labs (all labs ordered are listed, but only abnormal results are displayed) Labs Reviewed - No data to display  EKG None  Radiology No results found.  Procedures Procedures (including critical care time)  Medications Ordered in UC Medications - No data to display  Initial Impression / Assessment and Plan / UC Course  I have reviewed the triage vital signs and the nursing notes.  Pertinent labs & imaging results that  were available during my care of the patient were reviewed by me and considered in my medical decision making (see chart for details).  I explained that this most likely represents a viral syndrome.  Since it is 12 days since onset, we do not believe she is contagious.   Her symptoms suggest ongoing inflammation in bronchial tubes.   Final Clinical Impressions(s) / UC Diagnoses   Final diagnoses:  Atypical chest pain  Acute bronchitis, unspecified organism     Discharge Instructions     The discomfort that you feel is most likely inflammation in the bronchial tubes and should subside slowly over 1-2 weeks.    ED Prescriptions    Medication Sig Dispense Auth. Provider   predniSONE (DELTASONE) 50 MG tablet One daily with food 5 tablet Robyn Haber, MD     Controlled Substance Prescriptions [place] Controlled Substance Registry consulted? Not Applicable   Robyn Haber, MD 09/08/18 1725

## 2018-09-08 NOTE — ED Triage Notes (Signed)
Pt here for continued chest discomfort and new onset SOB that started 2 days ago.  Pt is in NAD at this time.  She is able to carry on a conversation w/o any SOB.  Pt has not had a fever or cough.  Pt was seen about 12 days ago for similar complaints and then had a evisit between then and now and was put on antibiotics.

## 2018-09-09 ENCOUNTER — Telehealth: Payer: BLUE CROSS/BLUE SHIELD | Admitting: Family

## 2018-09-09 ENCOUNTER — Telehealth (INDEPENDENT_AMBULATORY_CARE_PROVIDER_SITE_OTHER): Payer: Self-pay | Admitting: Physician Assistant

## 2018-09-09 DIAGNOSIS — R6889 Other general symptoms and signs: Principal | ICD-10-CM

## 2018-09-09 DIAGNOSIS — Z20822 Contact with and (suspected) exposure to covid-19: Secondary | ICD-10-CM

## 2018-09-09 DIAGNOSIS — R0602 Shortness of breath: Secondary | ICD-10-CM

## 2018-09-09 MED ORDER — BENZONATATE 100 MG PO CAPS: 100.0000 mg | ORAL_CAPSULE | Freq: Three times a day (TID) | ORAL | 0 refills | Status: DC | PRN

## 2018-09-09 NOTE — Progress Notes (Signed)
E-Visit for Corona Virus Screening  Based on your current symptoms, you may very well have the virus, however your symptoms are mild. Currently, not all patients are being tested. If the symptoms are mild and there is not a known exposure, performing the test is not indicated.  Coronavirus disease 2019 (COVID-19) is a respiratory illness that can spread from person to person. The virus that causes COVID-19 is a new virus that was first identified in the country of [place] but is now found in multiple other countries and has spread to the [place].  Symptoms associated with the virus are mild to severe fever, cough, and shortness of breath. There is currently no vaccine to protect against COVID-19, and there is no specific antiviral treatment for the virus.   To be considered HIGH RISK for Coronavirus (COVID-19), you have to meet the following criteria:  . Traveled to [place], [place], [place], [place] or [place]; or in the [place] to [place], [place], [place], or [place]; and have fever, cough, and shortness of breath within the last 2 weeks of travel OR  . Been in close contact with a person diagnosed with COVID-19 within the last 2 weeks and have fever, cough, and shortness of breath  . IF YOU DO NOT MEET THESE CRITERIA, YOU ARE CONSIDERED LOW RISK FOR COVID-19.   It is vitally important that if you feel that you have an infection such as this virus or any other virus that you stay home and away from places where you may spread it to others.  You should self-quarantine for 14 days if you have symptoms that could potentially be coronavirus and avoid contact with people age 56 and older.   You can use medication such as A prescription cough medication called Tessalon Perles 100 mg. You may take 1-2 capsules every 8 hours as needed for cough.  Approximately 5 minutes was spent documenting and reviewing patient's chart.    You may also take acetaminophen (Tylenol) as needed for  fever.   Reduce your risk of any infection by using the same precautions used for avoiding the common cold or flu:  Marland Kitchen Wash your hands often with soap and warm water for at least 20 seconds.  If soap and water are not readily available, use an alcohol-based hand sanitizer with at least 60% alcohol.  . If coughing or sneezing, cover your mouth and nose by coughing or sneezing into the elbow areas of your shirt or coat, into a tissue or into your sleeve (not your hands). . Avoid shaking hands with others and consider head nods or verbal greetings only. . Avoid touching your eyes, nose, or mouth with unwashed hands.  . Avoid close contact with people who are sick. . Avoid places or events with large numbers of people in one location, like concerts or sporting events. . Carefully consider travel plans you have or are making. . If you are planning any travel outside or inside the [place], visit the CDC's Travelers' Health webpage for the latest health notices. . If you have some symptoms but not all symptoms, continue to monitor at home and seek medical attention if your symptoms worsen. . If you are having a medical emergency, call 911.  HOME CARE . Only take medications as instructed by your medical team. . Drink plenty of fluids and get plenty of rest. . A steam or ultrasonic humidifier can help if you have congestion.   GET HELP RIGHT AWAY IF: .  You develop worsening fever. . You become short of breath . You cough up blood. . Your symptoms become more severe MAKE SURE YOU   Understand these instructions.  Will watch your condition.  Will get help right away if you are not doing well or get worse.  Your e-visit answers were reviewed by a board certified advanced clinical practitioner to complete your personal care plan.  Depending on the condition, your plan could have included both over the counter or prescription medications.  If there is a problem please reply once you have received a  response from your provider. Your safety is important to [place].  If you have drug allergies check your prescription carefully.    You can use MyChart to ask questions about today's visit, request a non-urgent call back, or ask for a work or school excuse for 24 hours related to this e-Visit. If it has been greater than 24 hours you will need to follow up with your provider, or enter a new e-Visit to address those concerns. You will get an e-mail in the next two days asking about your experience.  I hope that your e-visit has been valuable and will speed your recovery. Thank you for using e-visits.

## 2018-09-10 NOTE — Progress Notes (Signed)
Duplicate, addressed by another E-visit provider.

## 2018-09-12 ENCOUNTER — Other Ambulatory Visit: Payer: Self-pay

## 2018-09-12 ENCOUNTER — Ambulatory Visit (INDEPENDENT_AMBULATORY_CARE_PROVIDER_SITE_OTHER): Payer: BLUE CROSS/BLUE SHIELD | Admitting: General Surgery

## 2018-09-12 DIAGNOSIS — Z9889 Other specified postprocedural states: Secondary | ICD-10-CM | POA: Insufficient documentation

## 2018-09-12 DIAGNOSIS — Z86018 Personal history of other benign neoplasm: Secondary | ICD-10-CM | POA: Diagnosis not present

## 2018-09-12 NOTE — Telephone Encounter (Signed)
Follow up   Pt is calling, states she was suppose to be referred to get a mammogram and an xray on right rib. Please f/u

## 2018-09-12 NOTE — Progress Notes (Signed)
Virtual Visit via Telephone Note  I connected with Natalie Thornton on 09/12/18 at  2:15 PM EDT by telephone and verified that I am speaking with the correct person using two identifiers.   I discussed the limitations, risks, security and privacy concerns of performing an evaluation and management service by telephone and the availability of in person appointments. I also discussed with the patient that there may be a patient responsible charge related to this service. The patient expressed understanding and agreed to proceed.   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   Observations/Objective: Since her operation, Natalie Thornton has had what sounds like a viral syndrome.  She has been evaluated via telemedicine and in the emergency department.  She was concerned that she may have COVID-19, but she she did not meet criteria for testing.  She states that the discomfort and rib pain that she had prior to surgery in the area of the lipoma is still present.  It seems to improve with ibuprofen.  She denies any erythema, induration, drainage from the site.  Assessment and Plan: This is a 33 year old woman who underwent excision of a lipoma from her right upper back.  She had desired to the surgery due to discomfort in the area.  Unfortunately, that discomfort is still present.  It sounds primarily musculoskeletal in nature.  I have recommended that she continue to use ibuprofen on an as-needed basis.  She may also consider use of a heating pad in the area.  He should follow-up with her primary care provider for further evaluation and treatment.  Follow Up Instructions: She has no ongoing general surgical needs.  We will see her on an as-needed basis.   I discussed the assessment and treatment plan with the patient. The patient was provided an opportunity to ask questions and all were  answered. The patient agreed with the plan and demonstrated an understanding of the instructions.   The patient was advised to call back or seek an in-person evaluation if the symptoms worsen or if the condition fails to improve as anticipated.  I provided 10 minutes of non-face-to-face time during this encounter.   Fredirick Maudlin, MD

## 2018-09-12 NOTE — Telephone Encounter (Signed)
Returned pt call and left a detailed vm informing pt that mammogram orders were placed and she will need to call and schedule if she has not heard from them. I also made pt aware that there were no orders in her chart for xray of the ribs. I informed pt that if she has any questions or concerns to give me a call

## 2018-09-15 ENCOUNTER — Telehealth: Payer: BLUE CROSS/BLUE SHIELD | Admitting: Nurse Practitioner

## 2018-09-15 DIAGNOSIS — R6889 Other general symptoms and signs: Principal | ICD-10-CM

## 2018-09-15 DIAGNOSIS — Z20822 Contact with and (suspected) exposure to covid-19: Secondary | ICD-10-CM

## 2018-09-15 NOTE — Progress Notes (Signed)
E-Visit for Corona Virus Screening  Based on your current symptoms, you may very well have the virus, however your symptoms are mild. Currently, not all patients are being tested. If the symptoms are mild and there is not a known exposure, performing the test is not indicated.  Coronavirus disease 2019 (COVID-19) is a respiratory illness that can spread from person to person. The virus that causes COVID-19 is a new virus that was first identified in the country of [place] but is now found in multiple other countries and has spread to the [place].  Symptoms associated with the virus are mild to severe fever, cough, and shortness of breath. There is currently no vaccine to protect against COVID-19, and there is no specific antiviral treatment for the virus.   To be considered HIGH RISK for Coronavirus (COVID-19), you have to meet the following criteria:  . Traveled to [place], [place], [place], [place] or [place]; or in the [place] to [place], [place], [place], or [place]; and have fever, cough, and shortness of breath within the last 2 weeks of travel OR  . Been in close contact with a person diagnosed with COVID-19 within the last 2 weeks and have fever, cough, and shortness of breath  . IF YOU DO NOT MEET THESE CRITERIA, YOU ARE CONSIDERED LOW RISK FOR COVID-19.   It is vitally important that if you feel that you have an infection such as this virus or any other virus that you stay home and away from places where you may spread it to others.  You should self-quarantine for 14 days if you have symptoms that could potentially be coronavirus and avoid contact with people age 41 and older.   You can use medication such as delsym or mucinex OTC for cough  You may also take acetaminophen (Tylenol) as needed for fever.   Reduce your risk of any infection by using the same precautions used for avoiding the common cold or flu:  Marland Kitchen Wash your hands often with soap and warm water for at least  20 seconds.  If soap and water are not readily available, use an alcohol-based hand sanitizer with at least 60% alcohol.  . If coughing or sneezing, cover your mouth and nose by coughing or sneezing into the elbow areas of your shirt or coat, into a tissue or into your sleeve (not your hands). . Avoid shaking hands with others and consider head nods or verbal greetings only. . Avoid touching your eyes, nose, or mouth with unwashed hands.  . Avoid close contact with people who are sick. . Avoid places or events with large numbers of people in one location, like concerts or sporting events. . Carefully consider travel plans you have or are making. . If you are planning any travel outside or inside the [place], visit the CDC's Travelers' Health webpage for the latest health notices. . If you have some symptoms but not all symptoms, continue to monitor at home and seek medical attention if your symptoms worsen. . If you are having a medical emergency, call 911.  HOME CARE . Only take medications as instructed by your medical team. . Drink plenty of fluids and get plenty of rest. . A steam or ultrasonic humidifier can help if you have congestion.   GET HELP RIGHT AWAY IF: . You develop worsening fever. . You become short of breath . You cough up blood. . Your symptoms become more severe MAKE SURE YOU   Understand these  instructions.  Will watch your condition.  Will get help right away if you are not doing well or get worse.  Your e-visit answers were reviewed by a board certified advanced clinical practitioner to complete your personal care plan.  Depending on the condition, your plan could have included both over the counter or prescription medications.  If there is a problem please reply once you have received a response from your provider. Your safety is important to [place].  If you have drug allergies check your prescription carefully.    You can use MyChart to ask questions about today's  visit, request a non-urgent call back, or ask for a work or school excuse for 24 hours related to this e-Visit. If it has been greater than 24 hours you will need to follow up with your provider, or enter a new e-Visit to address those concerns. You will get an e-mail in the next two days asking about your experience.  I hope that your e-visit has been valuable and will speed your recovery. Thank you for using e-visits.   5 minutes spent reviewing and documenting in chart.

## 2018-09-17 ENCOUNTER — Ambulatory Visit
Admission: RE | Admit: 2018-09-17 | Discharge: 2018-09-17 | Disposition: A | Discharge status: Home / Self Care | Payer: BLUE CROSS/BLUE SHIELD | Source: Ambulatory Visit | Attending: Nurse Practitioner | Admitting: Nurse Practitioner

## 2018-09-17 ENCOUNTER — Other Ambulatory Visit: Payer: Self-pay

## 2018-09-17 DIAGNOSIS — N644 Mastodynia: Secondary | ICD-10-CM

## 2018-09-18 ENCOUNTER — Telehealth: Payer: Self-pay | Admitting: Internal Medicine

## 2018-09-18 ENCOUNTER — Other Ambulatory Visit: Payer: Self-pay | Admitting: Nurse Practitioner

## 2018-09-18 DIAGNOSIS — N6489 Other specified disorders of breast: Secondary | ICD-10-CM

## 2018-09-18 NOTE — Telephone Encounter (Signed)
Patient called to check on the results for her [place]. Please follow up once reviewed

## 2018-09-24 ENCOUNTER — Other Ambulatory Visit: Payer: Self-pay | Admitting: Nurse Practitioner

## 2018-09-24 DIAGNOSIS — N644 Mastodynia: Secondary | ICD-10-CM

## 2018-09-24 NOTE — Telephone Encounter (Signed)
Will route to PCP 

## 2018-09-24 NOTE — Telephone Encounter (Signed)
Patient called to get clarification on the [place] results. Please follow up.

## 2018-09-24 NOTE — Telephone Encounter (Signed)
Will forward to [place]. I don't see anything in chart about results   Pt did a televist with pt at the end of March. Pt had imaging done on 09/17/18

## 2018-09-25 ENCOUNTER — Other Ambulatory Visit: Payer: Self-pay | Admitting: Nurse Practitioner

## 2018-09-25 ENCOUNTER — Encounter: Payer: Self-pay | Admitting: Internal Medicine

## 2018-09-26 NOTE — Telephone Encounter (Signed)
Spoke with patient over the phone regarding [place] results

## 2018-09-27 ENCOUNTER — Encounter: Payer: Self-pay | Admitting: Family Medicine

## 2018-09-28 ENCOUNTER — Ambulatory Visit: Payer: BLUE CROSS/BLUE SHIELD | Attending: Family Medicine | Admitting: Family Medicine

## 2018-09-28 ENCOUNTER — Other Ambulatory Visit: Payer: Self-pay

## 2018-09-28 ENCOUNTER — Telehealth: Payer: Self-pay | Admitting: *Deleted

## 2018-09-28 ENCOUNTER — Encounter: Payer: Self-pay | Admitting: Family Medicine

## 2018-09-28 ENCOUNTER — Ambulatory Visit: Payer: BLUE CROSS/BLUE SHIELD

## 2018-09-28 DIAGNOSIS — K5900 Constipation, unspecified: Secondary | ICD-10-CM

## 2018-09-28 DIAGNOSIS — R222 Localized swelling, mass and lump, trunk: Secondary | ICD-10-CM | POA: Diagnosis not present

## 2018-09-28 DIAGNOSIS — R1030 Lower abdominal pain, unspecified: Secondary | ICD-10-CM | POA: Diagnosis not present

## 2018-09-28 DIAGNOSIS — M545 Low back pain, unspecified: Secondary | ICD-10-CM

## 2018-09-28 DIAGNOSIS — G8929 Other chronic pain: Secondary | ICD-10-CM

## 2018-09-28 LAB — POCT URINALYSIS DIP (CLINITEK)
Bilirubin, UA: NEGATIVE
Blood, UA: NEGATIVE
Glucose, UA: NEGATIVE mg/dL
Ketones, POC UA: NEGATIVE mg/dL
Leukocytes, UA: NEGATIVE
Nitrite, UA: NEGATIVE
POC PROTEIN,UA: NEGATIVE
Spec Grav, UA: 1.02
Urobilinogen, UA: 0.2 U/dL
pH, UA: 7

## 2018-09-28 NOTE — Progress Notes (Signed)
Swelling in her collar bone just started yesterday and pain in lower stomach started about 2 wks ago.    last BM was 2 days ago. Per pt when she wipe back there's heaviness.

## 2018-09-28 NOTE — Progress Notes (Signed)
Virtual Visit via Telephone Note  I connected with Natalie Thornton on 09/28/18 at  8:30 AM EDT by telephone and verified that I am speaking with the correct person using two identifiers.   I discussed the limitations, risks, security and privacy concerns of performing an evaluation and management service by telephone and the availability of in person appointments. I also discussed with the patient that there may be a patient responsible charge related to this service. The patient expressed understanding and agreed to proceed.  Patient Location: Home Provider Location: Office Others participating in call: Emilio Aspen, CMA   History of Present Illness:      33 yo female patient of Dr. Wynetta Emery, who states that she  noticed some swelling on her right collarbone yesterday. Area was not tender but was possibly a little red. Area is still a little today. She has had no injury to the area. No personal or family history of arthritis.      She has also had issues with constipation and crampy lower abdomen pain. She did have a bowel movement about 2 days ago but had to use two suppositories before having the bowel movement. Stool was hard and defecation was slightly painful. No blood in the stool and no dark stool. She thinks that before using the suppositories she may not have had a bowel movement for about 5 days. She has also had a pressure sensation in her lower abdomen at the end of urination. She also has a mild increase in her chronic low back pain. She also has felt as if she has had chills for a few days and felt as if she had a fever about 2 days ago. She may have also had some mild increase in urinary frequency. She reports no possibility of pregnancy and her last period ended on April 8th.       She reports that she was recently referred by her PCP to have removal of a lipoma which was thought to be the cause of her back pain but she continues to have issues with back pain and wonders what she  needs to do next.   Past Medical History:  Diagnosis Date   Anxiety    Arthritis    DDD (degenerative disc disease)    Degenerative disc disease, cervical    Disc disease, degenerative, cervical    H pylori ulcer    H/O degenerative disc disease 2013   History of hiatal hernia    Pilonidal cyst     Past Surgical History:  Procedure Laterality Date   COLONOSCOPY     LIPOMA EXCISION Right 08/10/2018   Procedure: EXCISION LIPOMA, RIGHT;  Surgeon: Fredirick Maudlin, MD;  Location: [facility] ORS;  Service: General;  Laterality: Right;    Family History  Problem Relation Age of Onset   AAA (abdominal aortic aneurysm) Maternal Grandmother    Leukemia Maternal Grandmother    Breast cancer Maternal Grandmother    Head & neck cancer Paternal Grandmother        Back cancer   Lymphoma Paternal Grandmother    Cirrhosis Maternal Uncle    Breast cancer Other    Colon cancer Neg Hx    Esophageal cancer Neg Hx    Rectal cancer Neg Hx    Stomach cancer Neg Hx     Social History   Tobacco Use   Smoking status: Current Every Day Smoker    Packs/day: 5.00    Years: 10.00    Pack years: 50.00  Types: Cigars   Smokeless tobacco: Never Used   Tobacco comment: BLACK [employer] MILDS  Substance Use Topics   Alcohol use: Yes    Comment: occasionally   Drug use: No     No Known Allergies   Review of Systems  Constitutional: Positive for chills and fever.  Respiratory: Negative for cough and shortness of breath.   Cardiovascular: Negative for chest pain and palpitations.  Gastrointestinal: Positive for abdominal pain and constipation. Negative for blood in stool, diarrhea, melena, nausea and vomiting.  Genitourinary: Positive for frequency. Negative for dysuria.  Musculoskeletal: Positive for back pain. Negative for joint pain.  Skin: Negative for itching and rash.  Neurological: Negative for dizziness and headaches.  Endo/Heme/Allergies: Negative for polydipsia. Does  not bruise/bleed easily.      Observations/Objective: No vital signs or physical exam conducted as visit was done via telephone  Assessment and Plan: 1. Lower abdominal pain Pain may be related to UTI or from current constipation. Patient was asked to come into the office and leave a urine sample for UA some time today. Patient also advised to increase fluid, fiber and take laxatives as needed to help resolve current constipation. Go to urgent care this weekend if this is worsening of abdominal pain or any concerns. - POCT URINALYSIS DIP (CLINITEK)  2. Constipation, unspecified constipation type Increase daily intake of water and fiber. Use otc laxatives for a few days to help with current constipation.   3. Low back pain She was asked to come into the office and leave a urine sample in case her back pain and lower abdomen pain are due to a UTI. Patient is asked to make an appointment with her PCP in follow-up of her back pain/lipoma removal  4. Swelling on collar bone/nodule of anterior chest Use cool compresses to the area this weekend but seek medication attention if there is increased pain, increased swelling or area that appears slightly red is enlarging in color, increased warmth to the area of any concerns   Follow Up Instructions:Return in about 2 years (around 09/27/2020), or if symptoms worsen or fail to improve, for make appointment with PCP regarding back pain; sooner more pain in abdomen/collarbone .    I discussed the assessment and treatment plan with the patient. The patient was provided an opportunity to ask questions and all were answered. The patient agreed with the plan and demonstrated an understanding of the instructions.   The patient was advised to call back or seek an in-person evaluation if the symptoms worsen or if the condition fails to improve as anticipated.  I provided 14 minutes of non-face-to-face time during this encounter.   Antony Blackbird, MD

## 2018-09-28 NOTE — Telephone Encounter (Signed)
Patient came into office dropped off urine for a urine dip to be completed per provider request. Results was completed and provider is aware and patient is aware of results. Per provider to inform patient that results does not appear to have a UTI based on the results. Staff informed patient with information and she verbalized understanding.

## 2018-10-02 ENCOUNTER — Encounter: Payer: Self-pay | Admitting: Physical Medicine & Rehabilitation

## 2018-10-02 ENCOUNTER — Other Ambulatory Visit: Payer: Self-pay

## 2018-10-02 ENCOUNTER — Telehealth: Payer: BLUE CROSS/BLUE SHIELD | Admitting: Physician Assistant

## 2018-10-02 ENCOUNTER — Encounter: Payer: BLUE CROSS/BLUE SHIELD | Attending: Physical Medicine & Rehabilitation

## 2018-10-02 ENCOUNTER — Ambulatory Visit (HOSPITAL_BASED_OUTPATIENT_CLINIC_OR_DEPARTMENT_OTHER): Payer: BLUE CROSS/BLUE SHIELD | Admitting: Physical Medicine & Rehabilitation

## 2018-10-02 VITALS — Ht 60.0 in | Wt 130.0 lb

## 2018-10-02 DIAGNOSIS — R079 Chest pain, unspecified: Secondary | ICD-10-CM

## 2018-10-02 DIAGNOSIS — M5441 Lumbago with sciatica, right side: Secondary | ICD-10-CM | POA: Diagnosis not present

## 2018-10-02 DIAGNOSIS — G8929 Other chronic pain: Secondary | ICD-10-CM | POA: Diagnosis not present

## 2018-10-02 DIAGNOSIS — M5442 Lumbago with sciatica, left side: Secondary | ICD-10-CM | POA: Diagnosis not present

## 2018-10-02 DIAGNOSIS — R059 Cough, unspecified: Secondary | ICD-10-CM

## 2018-10-02 DIAGNOSIS — R509 Fever, unspecified: Secondary | ICD-10-CM

## 2018-10-02 DIAGNOSIS — M5136 Other intervertebral disc degeneration, lumbar region: Secondary | ICD-10-CM | POA: Insufficient documentation

## 2018-10-02 DIAGNOSIS — R05 Cough: Secondary | ICD-10-CM

## 2018-10-02 NOTE — Progress Notes (Signed)
Subjective:  Connected with audio and video on patient she was driving back from [place].  We discussed that we cannot do the WebEx visit at this time.   Called pt 2 hours later consent for Televisit-phone  Patient ID: Natalie Thornton, female    DOB: July 06, 1985, 33 y.o.   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 Does some walking with exercise Pain increased with prolonged walking HPI  Pt feel no improvement with back pain, has pain in midback and neck as well although low back is the worst  More muscle spasms even in bed Tried the back  Exercises I printed for her but they were not helpful   Some crackling in knees with bending but no pain Some neck and buttocks also hurt Poor sleep sometimes takes melatonin Also takes Ibuprofen and muscle relaxer  Occ Lower extremity pain in both legs No numbness or tingling in feet No leg weakness Occ constipation but no bowel or bladder incontinence   Pain Inventory Average Pain 10 Pain Right Now 8 My pain is constant, sharp, burning, dull, stabbing, tingling and aching  In the last 24 hours, has pain interfered with the following? General activity 8 Relation with others 8 Enjoyment of life 8 What TIME of day is your pain at its worst? all Sleep (in general) Poor  Pain is worse with: laying down Pain improves with: none Relief from Meds: 0  Mobility how many minutes can you walk? 30 ability to climb steps?  yes do you drive?  yes  Function employed # of hrs/week 30 I need assistance with the following:  dressing  Neuro/Psych tingling spasms  Prior Studies Any changes since last visit?  no LUMBAR  SPINE - COMPLETE 4+ VIEW  COMPARISON:  CT abdomen pelvis of 04/15/2017  FINDINGS: As noted on prior CT the abdomen pelvis there are 4 non rib-bearing lumbar vertebra. There is degenerative disc disease at L4-sacral interspace with loss of disc space and sclerosis with spurring. The remainder of intervertebral disc spaces appear normal. No compression deformity is seen. The SI joints appear corticated.  IMPRESSION: 1. The lumbar vertebrae are normal alignment. 2. Four non rib-bearing lumbar vertebra with degenerative disc disease at L4/sacrum.   Electronically Signed   By: Ivar Drape M.D.   On: 08/01/2017 16:08 Physicians involved in your care Primary care Dr. Karle Plumber   Family History  Problem Relation Age of Onset  . AAA (abdominal aortic aneurysm) Maternal Grandmother   . Leukemia Maternal Grandmother   . Breast cancer Maternal Grandmother   . Head & neck cancer Paternal Grandmother        Back cancer  . Lymphoma Paternal Grandmother   . Cirrhosis Maternal Uncle   . Breast cancer Other   . Colon cancer Neg Hx   . Esophageal cancer Neg Hx   . Rectal cancer Neg Hx   . Stomach cancer Neg Hx    Social History   Socioeconomic History  . Marital status: Single    Spouse name: Not on file  . Number of children: 2  . Years of education: Not on file  . Highest education level: Not on file  Occupational History  .  Occupation: [employer]  . Financial resource strain: Not on file  . Food insecurity:    Worry: Not on file    Inability: Not on file  . Transportation needs:    Medical: Not on file    Non-medical: Not on file  Tobacco Use  . Smoking status: Current Every Day Smoker    Packs/day: 5.00    Years: 10.00    Pack years: 50.00    Types: Cigars  . Smokeless tobacco: Never Used  . Tobacco comment: BLACK AND MILDS  Substance and Sexual Activity  . Alcohol use: Yes    Comment: occasionally  . Drug use: No  . Sexual activity:  Not on file  Lifestyle  . Physical activity:    Days per week: Not on file    Minutes per session: Not on file  . Stress: Not on file  Relationships  . Social connections:    Talks on phone: Not on file    Gets together: Not on file    Attends religious service: Not on file    Active member of club or organization: Not on file    Attends meetings of clubs or organizations: Not on file    Relationship status: Not on file  Other Topics Concern  . Not on file  Social History Narrative  . Not on file   Past Surgical History:  Procedure Laterality Date  . COLONOSCOPY    . LIPOMA EXCISION Right 08/10/2018   Procedure: EXCISION LIPOMA, RIGHT;  Surgeon: Fredirick Maudlin, MD;  Location: [facility] ORS;  Service: General;  Laterality: Right;   Past Medical History:  Diagnosis Date  . Anxiety   . Arthritis   . DDD (degenerative disc disease)   . Degenerative disc disease, cervical   . Disc disease, degenerative, cervical   . H pylori ulcer   . H/O degenerative disc disease 2013  . History of hiatal hernia   . Pilonidal cyst    Ht 5' (1.524 m)   Wt 130 lb (59 kg)   LMP 09/10/2018   BMI 25.39 kg/m   Opioid Risk Score:   Fall Risk Score:  `1  Depression screen PHQ 2/9  Depression screen [facility] 2/9 10/02/2018 09/28/2018 07/11/2018 06/14/2018 05/22/2018 02/22/2018 01/09/2018  Decreased Interest 0 0 1 1 1 3 2   Down, Depressed, Hopeless 0 0 0 1 0 3 2  PHQ - 2 Score 0 0 1 2 1 6 4   Altered sleeping - 1 1 3  - 3 3  Tired, decreased energy - 1 1 1  - 3 3  Change in appetite - 0 0 0 - 3 3  Feeling bad or failure about yourself  - 0 0 0 - 0 0  Trouble concentrating - 0 0 0 - 1 2  Moving slowly or fidgety/restless - 0 0 0 - 3 0  Suicidal thoughts - 0 0 0 - 0 0  PHQ-9 Score - 2 3 6  - 19 15  Difficult doing work/chores - Not difficult at all - - - - -  Some recent data might be hidden    Review of Systems  Constitutional: Positive for chills, fatigue and fever.  HENT: Positive for congestion,  rhinorrhea and sore throat.   Eyes: Negative.   Respiratory: Positive for cough and shortness of breath.   Cardiovascular: Negative.   Gastrointestinal: Positive for abdominal pain.  Endocrine: Negative.   Genitourinary: Negative.   Musculoskeletal: Positive for back pain.  Buttocks pain  Skin: Negative.   Allergic/Immunologic: Negative.   Neurological: Negative.   Hematological: Negative.   Psychiatric/Behavioral: Negative.   All other systems reviewed and are negative.      Objective:   Physical Exam  Speech is clear no evidence of dysarthria or aphasia Mentation is clear, at baseline Mood and Affect without lability or agitation Remainder of exam deferred due to phone visit     Assessment & Plan:  1.  Chronic low back pain with some mid back and buttocks pain as well, occ radiating to lower ext.  She has failed >6wks of conservative care.  Symptoms interfere with sleep and activity but has been able to work FT.  Lumbar xrays 08/01/2017 demonstrate DDD L1-2 and L4-S1 (4 lumbar vertebra)  Will order MRI of lumbar spine Needs to be at a [facility] facility based on her insurance  Also needs follow up with physiatrist affiliated with [facility], will make referral to Dr Arrie Eastern Covid Risk of complication score 0 (low risk)

## 2018-10-02 NOTE — Progress Notes (Signed)
E-Visit for [employer] Virus Screening  Based on what you have shared with me, you need to seek an evaluation for a severe illness that is causing your symptoms which may be coronavirus or some other illness. I recommend that you be seen and evaluated "face to face". Our Emergency Departments are best equipped to handle patients with severe symptoms.   I recommend the following:  If you are having a true medical emergency please call 911. If you are considered high risk for Corona virus because of a known exposure, fever, shortness of breath and cough, OR if you have severe symptoms of any kind, seek medical care at an emergency room.  Please call ahead and tell them that you were seen by telemedicine and they have recommended that you have a face to face evaluation. [facility] Emergency Department [place], [place], [place] [address] [phone]  [facility] Emergency Department [place], [place], [place] [address] [facility] Emergency Department [place], [place], [place] [address] [phone]  [facility] Emergency Department [place], [place], [place] [address] [facility] Emergency Department [place], [place], [place] [address] [phone]  NOTE: If you entered your credit card information for this eVisit, you will not be charged. You may see a "hold" on your card for the $35 but that hold will drop off and you will not have a charge processed.   Your e-visit answers were reviewed by a board certified advanced clinical practitioner to complete your personal care plan.  Thank you for using e-Visits.  ===View-only below this line===   ----- Message -----    From: [name]    Sent: 10/02/2018 10:03 AM EDT      To: E-Visit Mailing List Subject: E-Visit Submission: CoronaVirus ([id number])  Screening  E-Visit Submission: CoronaVirus ([id number]) Screening --------------------------------  Question: Do you have any of the following?  Answer:   Cough  Question: Do you have any of the following additional symptoms?  Answer:   Body aches  Question: Have you had a fever? Answer:   Yes  Question: If you are running a fever, please type in your temperature reading Answer:   100  Question: How long have you had the fever? Answer:   Just today  Question: Have others in your home or workplace had similar symptoms? Answer:   Yes  Question: When did your symptoms start? Answer:   Friday  Question: Have you recently visited any of the following countries? Answer:   None of these  Question: If you have traveled anywhere in the last  2 months please document where you have visited: Answer:   No  Question: Have you recently been around others from these countries or visited these countries who have had coughing or fever? Answer:   No  Question: Have you recently been around anyone who has been diagnosed with Corona virus? Answer:   No  Question: Have you been taking any medications? Answer:   No  Question: If taking medications for these symptoms, please list the names and whether they are helping or not Answer:     Question: Are you treated for any of the following conditions: Asthma, COPD, Diabetes, Renal Failure (on Dialysis), AIDS, any Neuromuscular disease that effects the clearing of secretions, Heart Failure, or Heart Disease? Answer:   No  Question: Please enter a phone  number where you can be reached if we have additional questions about your symptoms Answer:   [phone]  Question: Please list your medication allergies that you may have ? (If 'none' , please list as 'none') Answer:   None  Question: Please list any additional comments  Answer:   Very tight chest pain with dry cough cough isn't really bad. My throat and eyes itch here n there as  well  Question: Are you pregnant? Answer:   I am confident that I am not pregnant  Question: Are you breastfeeding? Answer:   No  A total of 5-10 minutes was spent evaluating this patients questionnaire and formulating a plan of care.

## 2018-10-09 ENCOUNTER — Other Ambulatory Visit: Payer: Self-pay

## 2018-10-09 ENCOUNTER — Ambulatory Visit: Payer: BLUE CROSS/BLUE SHIELD | Attending: Internal Medicine | Admitting: Internal Medicine

## 2018-10-09 DIAGNOSIS — R221 Localized swelling, mass and lump, neck: Secondary | ICD-10-CM | POA: Diagnosis not present

## 2018-10-09 DIAGNOSIS — N644 Mastodynia: Secondary | ICD-10-CM | POA: Diagnosis not present

## 2018-10-09 NOTE — Progress Notes (Signed)
Pt states she is still having breast pain.   Pt states she is having swelling on her right collar bone  Pt states she is not taking anything for the pain  Pt states she feels that she doesn't need to take anything for the pain. Pt sates the pain comes and goes

## 2018-10-09 NOTE — Progress Notes (Signed)
Virtual Visit via Telephone Note  I connected with Natalie Thornton on 10/09/18 at 12:43 p.m by telephone from my office and verified that I am speaking with the correct person using two identifiers. Pt is at home.     I discussed the limitations, risks, security and privacy concerns of performing an evaluation and management service by telephone and the availability of in person appointments. I also discussed with the patient that there may be a patient responsible charge related to this service. The patient expressed understanding and agreed to proceed.   History of Present Illness: Pt with hx of anxiety/depression, LBP with DDD in lumbar and thoracic spine, tob dep, functional dyspepsia, hx of pos H.pylori in past  On review of her chart, patient has had several encounters with the health care system over the past month.  Concern about her breasts.  Had MMG of breast last mth due to symptoms in LT breast. Probable benign asymmetry noted in the central posterior right breast on mammogram and ultrasound.  Follow-up 6 months imaging recommended.  Patient reports that since having the mammogram she is noted some soreness around the right nipple without drainage.  The breast also feels sore when she lays on her stomach.  Noticed a swelling over RT collar bone 2-3 wks ago.  About size of nickle.  Not painful.  Not sure if it has increased in size because she had not looked at it recently.  She did look at it while on the phone with me.  She said it looked about the same.    Observations/Objective: No direct observation done as this is a telephone encounter  Assessment and Plan: 1. Localized swelling, mass and lump, neck We will have her schedule for in person visit for me to evaluate  2. Mastalgia in female I have looked at her ultrasound and mammogram reports.  At this point I think we should just follow through with having the follow-up imaging done in 6 months.  Recommend Tylenol or ibuprofen  as needed   Follow Up Instructions: We will try to get her in for an in person visit in the next 2 to 3 weeks for me to take a look at the knot above her right collarbone   I discussed the assessment and treatment plan with the patient. The patient was provided an opportunity to ask questions and all were answered. The patient agreed with the plan and demonstrated an understanding of the instructions.   The patient was advised to call back or seek an in-person evaluation if the symptoms worsen or if the condition fails to improve as anticipated.  I provided 10 minutes of non-face-to-face time during this encounter.   Karle Plumber, MD

## 2018-10-15 ENCOUNTER — Telehealth: Payer: Self-pay

## 2018-10-15 NOTE — Telephone Encounter (Signed)
Pt calling for MRI results

## 2018-10-15 NOTE — Telephone Encounter (Signed)
It showed a moderate disc wear and tear  primarily at L5-S1, would recommend injections which can be done at the office of Dr Arrie Eastern in [facility]. I would recommend follow up within the next 1-2 mo.  Please check if appt has been made.  Pt's insurance requires [facility] MD see her . I will not schedule follow up appt with me.

## 2018-10-16 NOTE — Telephone Encounter (Signed)
I spoke with her. They have not called with an appointment yet. She is asking for a copy of the MRI.  Where she had it done said it had to come from [place].  Does the results get released to MyChart?

## 2018-10-16 NOTE — Telephone Encounter (Signed)
Because of her insurance this was done at [facility].  I am not sure whether my chart would show this.  We could potentially just print a copy for her

## 2018-10-18 ENCOUNTER — Telehealth: Payer: Self-pay

## 2018-10-18 NOTE — Telephone Encounter (Signed)
No, MRI not that bad

## 2018-10-18 NOTE — Telephone Encounter (Signed)
Pt calling asking according to her latest MRI result could she apply for disability?

## 2018-10-18 NOTE — Telephone Encounter (Signed)
Notified and I let her know that she has to go to [facility] for her MRI result record.

## 2018-10-22 ENCOUNTER — Encounter: Payer: Self-pay | Admitting: Primary Care

## 2018-10-22 ENCOUNTER — Ambulatory Visit: Payer: BLUE CROSS/BLUE SHIELD | Attending: Primary Care | Admitting: Primary Care

## 2018-10-22 ENCOUNTER — Other Ambulatory Visit: Payer: Self-pay

## 2018-10-22 DIAGNOSIS — R0789 Other chest pain: Secondary | ICD-10-CM | POA: Diagnosis not present

## 2018-10-22 DIAGNOSIS — F419 Anxiety disorder, unspecified: Secondary | ICD-10-CM | POA: Diagnosis not present

## 2018-10-22 DIAGNOSIS — R0609 Other forms of dyspnea: Secondary | ICD-10-CM

## 2018-10-22 DIAGNOSIS — R05 Cough: Secondary | ICD-10-CM

## 2018-10-22 DIAGNOSIS — F329 Major depressive disorder, single episode, unspecified: Secondary | ICD-10-CM | POA: Diagnosis not present

## 2018-10-22 DIAGNOSIS — Z720 Tobacco use: Secondary | ICD-10-CM

## 2018-10-22 DIAGNOSIS — F17210 Nicotine dependence, cigarettes, uncomplicated: Secondary | ICD-10-CM

## 2018-10-22 DIAGNOSIS — R059 Cough, unspecified: Secondary | ICD-10-CM

## 2018-10-22 DIAGNOSIS — R0600 Dyspnea, unspecified: Secondary | ICD-10-CM

## 2018-10-22 DIAGNOSIS — R079 Chest pain, unspecified: Secondary | ICD-10-CM

## 2018-10-22 NOTE — Progress Notes (Signed)
Virtual Visit via Telephone Note  I connected with [name] on 10/22/18 at  2:50 PM EDT by telephone and verified that I am speaking with the correct person using two identifiers.   I discussed the limitations, risks, security and privacy concerns of performing an evaluation and management service by telephone and the availability of in person appointments. I also discussed with the patient that there may be a patient responsible charge related to this service. The patient expressed understanding and agreed to proceed.   History of Present Illness: [name] c/o left side of chest when rub increased pain but heating pad help a little and pain on right arm and and rib . Cough intermittent productive cough Denies fever ,chill but states body ache. Past medical history  anxiety/depression, LBP with DDD in lumbar and thoracic spine, tob dep,functionaldyspepsia, hx of pos H.pylori.  Observations/Objective: Review of Systems  Constitutional: Negative.   HENT: Positive for congestion.   Eyes: Negative.   Respiratory: Positive for cough and sputum production.   Cardiovascular: Positive for chest pain.  Gastrointestinal: Positive for heartburn.  Genitourinary: Negative.   Musculoskeletal: Negative.   Skin: Negative.   Neurological: Positive for headaches.  Endo/Heme/Allergies: Negative.   Psychiatric/Behavioral: Negative.     Assessment and Plan: [name] was seen today for cough.  Diagnoses and all orders for this visit:  Chest pain in adult States it is in the middle hurts more when she rubs the area unclear which arm hurts right or left . Tried to clarify and unable to get a definite answer. D/W s/s of MI had her to repeat them call 911 and be evaluated in ED  Cough Used OTC cough medication no relief will send in cough medication and antihistamine On 09/15/2018 Suspected COVID-19   Tobacco abuse Chest is hurting every time she tries to smoke she is thinking about stopping .  At this time declines a cessation patch  Dyspnea on exertion If continues to have shortness of breath with fever, sore throat, body chill.   Follow Up Instructions:    I discussed the assessment and treatment plan with the patient. The patient was provided an opportunity to ask questions and all were answered. The patient agreed with the plan and demonstrated an understanding of the instructions.   The patient was advised to call back or seek an in-person evaluation if the symptoms worsen or if the condition fails to improve as anticipated.  I provided 22 minutes of non-face-to-face time during this encounter.   Kerin Perna, NP

## 2018-10-22 NOTE — Progress Notes (Signed)
Patient verified DOB Patient has taken medication today. Patient has eaten today. Patient complains of right side ribcage pain. Pain began yesterday morning when she woke up. She states she feels it while walking and increased and coughing. Patient complains of congestion with no fever. Patients cough has been present since 10/02/18. Patients girlfriend has a cough was present on yesterday.

## 2018-10-25 ENCOUNTER — Encounter: Payer: Self-pay | Admitting: Internal Medicine

## 2018-10-29 ENCOUNTER — Encounter: Payer: Self-pay | Admitting: Physician Assistant

## 2018-10-29 ENCOUNTER — Telehealth: Payer: BLUE CROSS/BLUE SHIELD | Admitting: Physician Assistant

## 2018-10-29 DIAGNOSIS — R0602 Shortness of breath: Secondary | ICD-10-CM

## 2018-10-29 DIAGNOSIS — R0789 Other chest pain: Secondary | ICD-10-CM

## 2018-10-29 NOTE — Progress Notes (Signed)
Based on what you shared with me, I feel your condition warrants further evaluation and I recommend that you be seen for a face to face office visit.  Sorry you arent feeling well Natalie Thornton! Your symptoms of worsening chest pressure and shortness of breath warrant a face to face evaluation. It is recommended that you go to the ER as chest pain can be to life threatening.    NOTE: If you entered your credit card information for this eVisit, you will not be charged. You may see a "hold" on your card for the $35 but that hold will drop off and you will not have a charge processed.  If you are having a true medical emergency please call 911.  If you need an urgent face to face visit, [facility] has four urgent care centers for your convenience.    PLEASE NOTE: THE INSTACARE LOCATIONS AND URGENT CARE CLINICS DO NOT HAVE THE TESTING FOR CORONAVIRUS COVID19 AVAILABLE.  IF YOU FEEL YOU NEED THIS TEST YOU MUST GO TO A TRIAGE LOCATION AT [facility]   [email or web address] to reserve your spot online an avoid wait times  [facility] [address], [address] [place], [place] [address] Modified hours of operation: Monday-Friday, 12 PM to 6 PM  Saturday & Sunday 10 AM to 4 PM *Across the street from [place] (New Address!) [address] [place], [place] [address] *Just off [address], across the road from [place] hours of operation: Monday-Friday, 12 PM to 6 PM  Closed Saturday & Sunday  [facility]'s modified hours of operation will be in effect from May 1 until May 31   The following sites will take your insurance:  . [facility] Health Urgent [place] a Provider at this Location  [address] [place], [place] [address] . 10 am to 8 pm Monday-Friday . 12 pm to 8 pm Saturday-Sunday   . [facility] Urgent Care at [place] a Provider at this Location  [address], [place], [place] [address] . 8 am to 8 pm Monday-Friday . 9 am to 6 pm Saturday . 11 am to 6 pm Sunday   . [facility] Health Urgent Care at [place] Get Driving Directions  [address] [place],  [address] . 8 am to 8 pm Monday-Friday . 8 am to 4 pm Saturday-Sunday   Your e-visit answers were reviewed by a board certified advanced clinical practitioner to complete your personal care plan.  Thank you for using e-Visits. I have spent 7 min in completion and review of this note- Lacy Duverney [facility]

## 2018-11-05 ENCOUNTER — Telehealth: Payer: Self-pay | Admitting: Internal Medicine

## 2018-11-05 ENCOUNTER — Encounter (HOSPITAL_COMMUNITY): Payer: Self-pay

## 2018-11-05 ENCOUNTER — Ambulatory Visit (HOSPITAL_COMMUNITY)
Admission: EM | Admit: 2018-11-05 | Discharge: 2018-11-05 | Disposition: A | Discharge status: Home / Self Care | Payer: BLUE CROSS/BLUE SHIELD | Attending: Family Medicine | Admitting: Family Medicine

## 2018-11-05 ENCOUNTER — Other Ambulatory Visit: Payer: Self-pay

## 2018-11-05 ENCOUNTER — Ambulatory Visit (INDEPENDENT_AMBULATORY_CARE_PROVIDER_SITE_OTHER): Payer: BLUE CROSS/BLUE SHIELD

## 2018-11-05 DIAGNOSIS — R079 Chest pain, unspecified: Secondary | ICD-10-CM

## 2018-11-05 MED ORDER — IBUPROFEN 800 MG PO TABS: 800.0000 mg | ORAL_TABLET | Freq: Four times a day (QID) | ORAL | 0 refills | Status: DC | PRN

## 2018-11-05 NOTE — Telephone Encounter (Signed)
Patient called because she would like to get a MRI done because she states she has  chest pressure. Patient states her partner noticed a small knott/lump on her chest as well and patient can feel feel it. Please follow up.   Advised to go to the ED or UC.

## 2018-11-05 NOTE — Discharge Instructions (Signed)
Your chest x-ray and EKG were normal today. I am not seeing anything concerning on exam You can take ibuprofen as needed for pain If your symptoms persist I would recommend following up with your primary care for further evaluation and management. If your symptoms become more severe with increase of chest pain or shortness of breath you need to go to the ER. Follow up as needed for continued or worsening symptoms

## 2018-11-05 NOTE — Telephone Encounter (Signed)
Patients call taken.  Patient identified by name and date of birth.  Patient complaining of intermittent chest pressure locates in center chest and radiating to left side.  Patient has shortness of breath with chest pressure incidences.  Patient denies sore throat, congestion or runny nose.  Patient states when pressure is present it is 10/10.  Patient endorses a knot under her left breast.  Patient advised to go the Emergency Department to be assessed.  Patient acknowledged understanding of advice.

## 2018-11-05 NOTE — ED Provider Notes (Signed)
[place]    CSN: [id number] Arrival date & time: 11/05/18  1224     History   Chief Complaint Chief Complaint  Patient presents with   chest pressure.    HPI Natalie Thornton is a 33 y.o. female.   Patient is a 33 year old female with past medical history of anxiety, arthritis, degenerative disc disease.  She presents with approximately 2 weeks of intermittent, waxing waning chest pressure.  This is located on the left side of her chest and radiates into the left axilla area.  Reports that she noticed a knot to the infra clavicular area.  The knot is slightly tender to touch.  Reports that the pain comes on all of a sudden and can last for varying amounts of time.  Reports this feels different than her anxiety but the pain causes her to have anxiety.  She does not take anything for the pain.  Denies any breast lumps, breast tenderness.  She did have recent ultrasound which showed some right breast asymmetry but was thought to be benign.  Told to follow-up with reevaluation in 6 months.  Denies any associated fevers, chills or body aches.  Denies any heavy lifting or injuries.  Denies any history of DVT, PE.  Current everyday smoker.  No recent traveling or sick contacts.  ROS per HPI      Past Medical History:  Diagnosis Date   Anxiety    Arthritis    DDD (degenerative disc disease)    Degenerative disc disease, cervical    Disc disease, degenerative, cervical    H pylori ulcer    H/O degenerative disc disease 2013   History of hiatal hernia    Pilonidal cyst     Patient Active Problem List   Diagnosis Date Noted   S/P excision of lipoma 09/12/2018   Lipoma of back    Chronic bilateral thoracic back pain 07/17/2018   DDD (degenerative disc disease), lumbar 06/14/2018   Diabetes mellitus screening 01/06/2017   Tobacco abuse 01/06/2017   Anxiety and depression 01/06/2017   Dyspnea on exertion 10/20/2016   Cigarette smoker 10/20/2016    Solitary pulmonary nodule 10/20/2016   Acute contact dermatitis 10/20/2016   Pilonidal sinus 07/05/2011    Past Surgical History:  Procedure Laterality Date   COLONOSCOPY     LIPOMA EXCISION Right 08/10/2018   Procedure: EXCISION LIPOMA, RIGHT;  Surgeon: Fredirick Maudlin, MD;  Location: [facility] ORS;  Service: General;  Laterality: Right;    OB History   No obstetric history on file.      Home Medications    Prior to Admission medications   Medication Sig Start Date End Date Taking? Authorizing Provider  ibuprofen (ADVIL) 800 MG tablet Take 1 tablet (800 mg total) by mouth every 6 (six) hours as needed. 11/05/18   Orvan July, NP    Family History Family History  Problem Relation Age of Onset   AAA (abdominal aortic aneurysm) Maternal Grandmother    Leukemia Maternal Grandmother    Breast cancer Maternal Grandmother    Head & neck cancer Paternal Grandmother        Back cancer   Lymphoma Paternal Grandmother    Cirrhosis Maternal Uncle    Breast cancer Other    Colon cancer Neg Hx    Esophageal cancer Neg Hx    Rectal cancer Neg Hx    Stomach cancer Neg Hx     Social History Social History   Tobacco Use   Smoking  status: Current Every Day Smoker    Packs/day: 5.00    Years: 10.00    Pack years: 50.00    Types: Cigars   Smokeless tobacco: Never Used   Tobacco comment: BLACK AND MILDS  Substance Use Topics   Alcohol use: Yes    Comment: occasionally   Drug use: No     Allergies   Patient has no known allergies.   Review of Systems Review of Systems   Physical Exam Triage Vital Signs ED Triage Vitals  Enc Vitals Group     BP 11/05/18 1247 131/88     Pulse Rate 11/05/18 1247 73     Resp 11/05/18 1247 18     Temp 11/05/18 1247 98.9 F (37.2 C)     Temp src --      SpO2 11/05/18 1247 100 %     Weight 11/05/18 1249 134 lb (60.8 kg)     Height --      Head Circumference --      Peak Flow --      Pain Score 11/05/18 1249 4      Pain Loc --      Pain Edu? --      Excl. in [place]? --    No data found.  Updated Vital Signs BP 131/88 (BP Location: Left Arm)    Pulse 73    Temp 98.9 F (37.2 C)    Resp 18    Wt 134 lb (60.8 kg)    LMP 10/12/2018 (Exact Date)    SpO2 100%    BMI 26.17 kg/m   Visual Acuity Right Eye Distance:   Left Eye Distance:   Bilateral Distance:    Right Eye Near:   Left Eye Near:    Bilateral Near:     Physical Exam Chest:       Comments: Tenderness to palpation of left infra clavicular area.  No lymphadenopathy or mass palpated The skin is without erythema, bruising or rash.     UC Treatments / Results  Labs (all labs ordered are listed, but only abnormal results are displayed) Labs Reviewed - No data to display  EKG None  Radiology Dg Chest 2 View  Result Date: 11/05/2018 CLINICAL DATA:  Chest pain for 2 weeks EXAM: CHEST - 2 VIEW COMPARISON:  05/13/2018 FINDINGS: The heart size and mediastinal contours are within normal limits. Both lungs are clear. The visualized skeletal structures are unremarkable. IMPRESSION: No active cardiopulmonary disease. Electronically Signed   By: Inez Catalina M.D.   On: 11/05/2018 13:23    Procedures Procedures (including critical care time)  Medications Ordered in UC Medications - No data to display  Initial Impression / Assessment and Plan / UC Course  I have reviewed the triage vital signs and the nursing notes.  Pertinent labs & imaging results that were available during my care of the patient were reviewed by me and considered in my medical decision making (see chart for details).    Chest pain  Patient is a 33 year old female who presents today with intermittent, waxing waning chest pain that is been there for approximately 2 weeks. Patient has had multiple visits with her primary care provider over the past couple of months for chest pain, breast pain, back pain.  The chest pain is located over the left side of her chest into her  left axilla.  This pain comes and goes but when it comes is a 10 out of 10.  It does make her  mildly short of breath and she is anxious.  Reports that her spouse palpated a knot to the left infraclavicular area.  I did not palpate any knot here today.  There is no bruising, swelling or erythema. Breast appears normal. Chest x-ray did not reveal any acute abnormalities EKG normal sinus rhythm with normal rate. Her vital signs are stable and she is nontoxic or ill-appearing. Sats 100% No concern for DVT or PE No concern for ACS I feel it is safe to send patient home and have her monitor symptoms. She can take ibuprofen for pain as needed Instructed that if her symptoms worsen to include more severe chest pain or shortness of breath she would need to go to the ER. Otherwise she can follow-up with her doctor for further evaluation management of her symptoms. Patient understanding and agreed to plan.  Final Clinical Impressions(s) / UC Diagnoses   Final diagnoses:  Chest pain, unspecified type     Discharge Instructions     Your chest x-ray and EKG were normal today. I am not seeing anything concerning on exam You can take ibuprofen as needed for pain If your symptoms persist I would recommend following up with your primary care for further evaluation and management. If your symptoms become more severe with increase of chest pain or shortness of breath you need to go to the ER. Follow up as needed for continued or worsening symptoms     ED Prescriptions    Medication Sig Dispense Auth. Provider   ibuprofen (ADVIL) 800 MG tablet Take 1 tablet (800 mg total) by mouth every 6 (six) hours as needed. 30 tablet Loura Halt A, NP     Controlled Substance Prescriptions [place] Controlled Substance Registry consulted? Not Applicable   Orvan July, NP 11/05/18 1414

## 2018-11-05 NOTE — ED Triage Notes (Signed)
Pt states she has a lump on the left side of her chest. Pt states she feels a pressure. From time to time. This has been going on for 2 weeks.

## 2018-11-08 ENCOUNTER — Ambulatory Visit: Payer: BLUE CROSS/BLUE SHIELD | Admitting: Internal Medicine

## 2018-11-08 IMAGING — NM NM HEPATO W/GB/PHARM/[PERSON_NAME]
2 series · 12 of 12 positions shown · non-contrast
Comparison: None.

ADDENDUM:
Ejection fraction is within normal limits at 66 percent, normal
greater than 33 percent using the oral agent. Statement in
Impression section staging low ejection fraction is incorrect.
CLINICAL DATA: Right upper quadrant pain and vomiting

EXAM:
NUCLEAR MEDICINE HEPATOBILIARY IMAGING WITH GALLBLADDER EF
VIEWS:
Anterior, right lateral right upper quadrant
RADIOPHARMACEUTICALS:  5.16 mCi Nc-OOm  Choletec IV

[he hepatobiliary · 3.10mm/px · 6 of 60 frames shown (1 of 2)]
[frame 6/60]
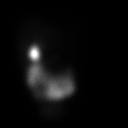
[frame 16/60]
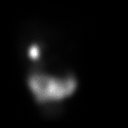
[frame 26/60]
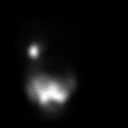
[frame 36/60]
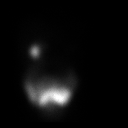
[frame 46/60]
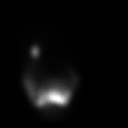
[frame 56/60]
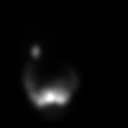

[he hepatobiliary · 3.10mm/px · 6 of 60 frames shown (2 of 2)]
[frame 6/60]
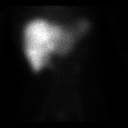
[frame 16/60]
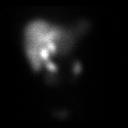
[frame 26/60]
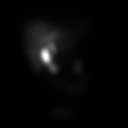
[frame 36/60]
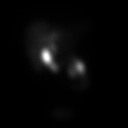
[frame 46/60]
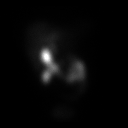
[frame 56/60]
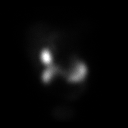

[12 of 12 positions shown; findings below may reference images not displayed]

FINDINGS: Liver uptake of radiotracer is normal. There is prompt visualization
of gallbladder and small bowel, indicating patency of the cystic and
common bile ducts. The patient consumed 8 ounces of Ensure orally
with calculation of the computer generated ejection fraction of
radiotracer from the gallbladder. The patient complained of
abdominal pain after the oral Ensure consumption. The computer
generated ejection fraction of radiotracer from the gallbladder is
normal at 66%, normal greater than 33% using the oral agent.
IMPRESSION: Low ejection fraction of radiotracer from the gallbladder. The
patient did complain of abdominal pain with the oral Ensure
consumption. Cystic and common bile ducts are patent as is evidenced
by visualization of gallbladder and small bowel.

## 2018-12-10 ENCOUNTER — Telehealth: Payer: Self-pay | Admitting: Internal Medicine

## 2018-12-10 NOTE — Telephone Encounter (Signed)
Patient called stating she has been having very sore breast nipple area and has noticed that there seemed to be a bit of  Crust around the area and believes she might have some discharge. Please follow up.

## 2018-12-10 NOTE — Telephone Encounter (Signed)
Pt states she has noticed right breast pain and discharge from nipple along with swelling in her collar bone on the right side.She stated this is reoccurring.   Will schedule patient an appointment for evaluation.  12/12/2018 at 0910- pt verbalized understanding.

## 2018-12-11 NOTE — Progress Notes (Signed)
Patient ID: Natalie Thornton, female   DOB: [birth year], 33 y.o.   MRN: [id number]     Natalie Thornton, is a 33 y.o. female  XKG:[id number]  FWY:[id number]  DOB - [birth year]  Subjective:  Chief Complaint and HPI: Natalie Thornton is a 33 y.o. female here today for R breast tenderness and nipple discharge yesterday. Finishing period now.  Has same sex partner so denies any possibility of pregnancy. No fever.  Had slightly abnormal mmg and U/S 09/2018.  Has not noticed a lump/mass.  No vision changes/HA.  No premenopausal breast CA in family.  Only breast CA was gm in her 70s-80s.     ROS:   Constitutional:  No f/c, No night sweats, No unexplained weight loss. EENT:  No vision changes, No blurry vision, No hearing changes. No mouth, throat, or ear problems.  Respiratory: No cough, No SOB Cardiac: No CP, no palpitations GI:  No abd pain, No N/V/D. GU: No Urinary s/sx Musculoskeletal: No joint pain Neuro: No headache, no dizziness, no motor weakness.  Skin: No rash Endocrine:  No polydipsia. No polyuria.  Psych: Denies SI/HI  No problems updated.  ALLERGIES: No Known Allergies  PAST MEDICAL HISTORY: Past Medical History:  Diagnosis Date   Anxiety    Arthritis    DDD (degenerative disc disease)    Degenerative disc disease, cervical    Disc disease, degenerative, cervical    H pylori ulcer    H/O degenerative disc disease 2013   History of hiatal hernia    Pilonidal cyst     MEDICATIONS AT HOME: Prior to Admission medications   Medication Sig Start Date End Date Taking? Authorizing Provider  cephALEXin (KEFLEX) 500 MG capsule Take 1 capsule (500 mg total) by mouth 3 (three) times daily. 12/12/18   Argentina Donovan, PA-C  fluconazole (DIFLUCAN) 150 MG tablet Take 1 tablet (150 mg total) by mouth once for 1 dose. 12/12/18 12/12/18  Argentina Donovan, PA-C  ibuprofen (ADVIL) 800 MG tablet Take 1 tablet (800 mg total) by mouth every 6 (six) hours as needed. Patient not taking:  Reported on 12/12/2018 11/05/18   Loura Halt A, NP     Objective:  EXAM:   Vitals:   12/12/18 0912  BP: 107/65  Pulse: 71  Temp: 98.9 F (37.2 C)  TempSrc: Oral  SpO2: 98%  Weight: 130 lb (59 kg)  Height: 5' (1.524 m)    General appearance : A&OX3. NAD. Non-toxic-appearing HEENT: Atraumatic and Normocephalic.  PERRLA. EOM intact.   Neck: supple, no JVD. No cervical lymphadenopathy. No thyromegaly B breasts examined w/o lump/mass and unable to express nipple discharge from either breast.  No erythema or definite abscess.   Chest/Lungs:  Breathing-non-labored, Good air entry bilaterally, breath sounds normal without rales, rhonchi, or wheezing  CVS: S1 S2 regular, no murmurs, gallops, rubs  Neurology:  CN II-XII grossly intact, Non focal.   Psych:  TP linear. J/I WNL. Normal speech. Appropriate eye contact and affect.  Skin:  No Rash  Data Review No results found for: HGBA1C   Assessment & Plan   1. Nipple discharge New-will cover for infection and try and go ahead and repeat MMG/U/S now vs the 6 month recommended f/up- - TSH - FSH/LH - Prolactin - MM Digital Diagnostic Unilat R; Future - cephALEXin (KEFLEX) 500 MG capsule; Take 1 capsule (500 mg total) by mouth 3 (three) times daily.  Dispense: 21 capsule; Refill: 0 - fluconazole (DIFLUCAN) 150 MG tablet; Take 1 tablet (150  mg total) by mouth once for 1 dose.  Dispense: 1 tablet; Refill: 0   Patient have been counseled extensively about nutrition and exercise  Return in about 6 months (around 06/14/2019) for Dr Wynetta Emery.  The patient was given clear instructions to go to ER or return to medical center if symptoms don't improve, worsen or new problems develop. The patient verbalized understanding. The patient was told to call to get lab results if they haven't heard anything in the next week.     Freeman Caldron, PA-C [facility] and [facility] [place], [place]   12/12/2018, 9:32 AM

## 2018-12-12 ENCOUNTER — Ambulatory Visit: Payer: BLUE CROSS/BLUE SHIELD | Attending: Family Medicine | Admitting: Physician Assistant

## 2018-12-12 ENCOUNTER — Other Ambulatory Visit: Payer: Self-pay

## 2018-12-12 VITALS — BP 107/65 | HR 71 | Temp 98.9°F | Ht 60.0 in | Wt 130.0 lb

## 2018-12-12 DIAGNOSIS — N6452 Nipple discharge: Secondary | ICD-10-CM | POA: Diagnosis not present

## 2018-12-12 MED ORDER — CEPHALEXIN 500 MG PO CAPS: 500.0000 mg | ORAL_CAPSULE | Freq: Three times a day (TID) | ORAL | 0 refills | Status: DC

## 2018-12-12 MED ORDER — FLUCONAZOLE 150 MG PO TABS: 150.0000 mg | ORAL_TABLET | Freq: Once | ORAL | 0 refills | Status: AC

## 2018-12-12 MED FILL — CEPHALEXIN 500 MG CAPSULE: 500 | 7 days supply | Qty: 21 | Fill #0

## 2018-12-12 MED FILL — FLUCONAZOLE 150 MG TABS: 150 | 1 days supply | Qty: 1 | Fill #0

## 2018-12-12 NOTE — Progress Notes (Signed)
Patient is having pain in right nipple.

## 2018-12-13 ENCOUNTER — Other Ambulatory Visit: Payer: Self-pay | Admitting: Physician Assistant

## 2018-12-13 DIAGNOSIS — N6452 Nipple discharge: Secondary | ICD-10-CM

## 2018-12-13 DIAGNOSIS — E221 Hyperprolactinemia: Secondary | ICD-10-CM

## 2018-12-13 LAB — TSH: TSH: 2 u[IU]/mL (ref 0.450–4.500)

## 2018-12-13 LAB — FSH/LH
FSH: 5.2 m[IU]/mL
LH: 6.9 m[IU]/mL

## 2018-12-13 LAB — PROLACTIN: Prolactin: 39.7 ng/mL — ABNORMAL HIGH (ref 4.8–23.3)

## 2018-12-17 ENCOUNTER — Other Ambulatory Visit: Payer: Self-pay | Admitting: Physician Assistant

## 2018-12-17 DIAGNOSIS — N6452 Nipple discharge: Secondary | ICD-10-CM

## 2018-12-24 ENCOUNTER — Ambulatory Visit
Admission: RE | Admit: 2018-12-24 | Discharge: 2018-12-24 | Disposition: A | Discharge status: Home / Self Care | Payer: Self-pay | Source: Ambulatory Visit | Attending: Physician Assistant | Admitting: Physician Assistant

## 2018-12-24 ENCOUNTER — Other Ambulatory Visit: Payer: Self-pay

## 2018-12-24 ENCOUNTER — Ambulatory Visit
Admission: RE | Admit: 2018-12-24 | Discharge: 2018-12-24 | Disposition: A | Discharge status: Home / Self Care | Payer: BLUE CROSS/BLUE SHIELD | Source: Ambulatory Visit | Attending: Physician Assistant | Admitting: Physician Assistant

## 2018-12-24 ENCOUNTER — Other Ambulatory Visit: Payer: Self-pay | Admitting: Physician Assistant

## 2018-12-24 DIAGNOSIS — N6489 Other specified disorders of breast: Secondary | ICD-10-CM

## 2018-12-24 DIAGNOSIS — N6452 Nipple discharge: Secondary | ICD-10-CM

## 2019-01-04 ENCOUNTER — Encounter (HOSPITAL_COMMUNITY): Payer: Self-pay

## 2019-01-04 ENCOUNTER — Other Ambulatory Visit: Payer: Self-pay

## 2019-01-04 ENCOUNTER — Ambulatory Visit (HOSPITAL_COMMUNITY)
Admission: EM | Admit: 2019-01-04 | Discharge: 2019-01-04 | Disposition: A | Discharge status: Home / Self Care | Payer: BLUE CROSS/BLUE SHIELD | Attending: Internal Medicine | Admitting: Internal Medicine

## 2019-01-04 DIAGNOSIS — F411 Generalized anxiety disorder: Secondary | ICD-10-CM | POA: Diagnosis not present

## 2019-01-04 MED ORDER — HYDROXYZINE HCL 25 MG PO TABS: 25.0000 mg | ORAL_TABLET | Freq: Three times a day (TID) | ORAL | 0 refills | Status: DC | PRN

## 2019-01-04 NOTE — ED Triage Notes (Signed)
Pt presents with ongoing central chest pressure/tightness, flank pain, and shortness of breath X 5 days.

## 2019-01-04 NOTE — Discharge Instructions (Addendum)
I believe that your symptoms are anxiety related. I am prescribing hydroxyzine you can take this every 8 hours as needed for anxiety or acute panic attack. If this is a problem that persist you may want to think about starting on a long-term anxiety medication You will need to follow-up with your primary care provider for this.

## 2019-01-06 NOTE — ED Provider Notes (Signed)
[place]    CSN: [id number] Arrival date & time: 01/04/19  1257     History   Chief Complaint Chief Complaint  Patient presents with  . Chest Pressure/Tightness  . Shortness of Breath    HPI Natalie Thornton is a 33 y.o. female.   Pt is a 33 year old female that presents with chest pain, anxiety, mild SOB. This has been an ongoing problem for her for a while. Worse over the last week. reports that she had to leave work because of anxiety. She becomes anxious and then starts having chest pain, SOB. She has never been dx with anxiety or taken medications. She has had multiple visits for various things over the past 6 months. No associated cough, chest congestion, fever, sore throat or ear pain. No recent sick contacts. No COVID exposure. No hx of asthma, PE.   ROS per HPI      Past Medical History:  Diagnosis Date  . Anxiety   . Arthritis   . DDD (degenerative disc disease)   . Degenerative disc disease, cervical   . Disc disease, degenerative, cervical   . H pylori ulcer   . H/O degenerative disc disease 2013  . History of hiatal hernia   . Pilonidal cyst     Patient Active Problem List   Diagnosis Date Noted  . S/P excision of lipoma 09/12/2018  . Lipoma of back   . Chronic bilateral thoracic back pain 07/17/2018  . DDD (degenerative disc disease), lumbar 06/14/2018  . Diabetes mellitus screening 01/06/2017  . Tobacco abuse 01/06/2017  . Anxiety and depression 01/06/2017  . Dyspnea on exertion 10/20/2016  . Cigarette smoker 10/20/2016  . Solitary pulmonary nodule 10/20/2016  . Acute contact dermatitis 10/20/2016  . Pilonidal sinus 07/05/2011    Past Surgical History:  Procedure Laterality Date  . COLONOSCOPY    . LIPOMA EXCISION Right 08/10/2018   Procedure: EXCISION LIPOMA, RIGHT;  Surgeon: Fredirick Maudlin, MD;  Location: [facility] ORS;  Service: General;  Laterality: Right;    OB History   No obstetric history on file.      Home  Medications    Prior to Admission medications   Medication Sig Start Date End Date Taking? Authorizing Provider  hydrOXYzine (ATARAX/VISTARIL) 25 MG tablet Take 1 tablet (25 mg total) by mouth every 8 (eight) hours as needed for anxiety. 01/04/19   Orvan July, NP    Family History Family History  Problem Relation Age of Onset  . AAA (abdominal aortic aneurysm) Maternal Grandmother   . Leukemia Maternal Grandmother   . Breast cancer Maternal Grandmother   . Head & neck cancer Paternal Grandmother        Back cancer  . Lymphoma Paternal Grandmother   . Cirrhosis Maternal Uncle   . Breast cancer Other   . Colon cancer Neg Hx   . Esophageal cancer Neg Hx   . Rectal cancer Neg Hx   . Stomach cancer Neg Hx     Social History Social History   Tobacco Use  . Smoking status: Current Every Day Smoker    Packs/day: 5.00    Years: 10.00    Pack years: 50.00    Types: Cigars  . Smokeless tobacco: Never Used  . Tobacco comment: BLACK AND MILDS  Substance Use Topics  . Alcohol use: Yes    Comment: occasionally  . Drug use: No     Allergies   Patient has no known allergies.   Review  of Systems Review of Systems   Physical Exam Triage Vital Signs ED Triage Vitals  Enc Vitals Group     BP 01/04/19 1323 137/85     Pulse Rate 01/04/19 1323 69     Resp 01/04/19 1323 16     Temp 01/04/19 1323 98.7 F (37.1 C)     Temp Source 01/04/19 1323 Oral     SpO2 01/04/19 1323 98 %     Weight --      Height --      Head Circumference --      Peak Flow --      Pain Score 01/04/19 1325 6     Pain Loc --      Pain Edu? --      Excl. in [place]? --    No data found.  Updated Vital Signs BP 137/85 (BP Location: Right Arm)   Pulse 69   Temp 98.7 F (37.1 C) (Oral)   Resp 16   LMP 12/06/2018   SpO2 98%   Visual Acuity Right Eye Distance:   Left Eye Distance:   Bilateral Distance:    Right Eye Near:   Left Eye Near:    Bilateral Near:     Physical Exam Vitals signs and  nursing note reviewed.  Constitutional:      General: She is not in acute distress.    Appearance: She is well-developed. She is not ill-appearing or toxic-appearing.  HENT:     Head: Normocephalic and atraumatic.  Eyes:     Conjunctiva/sclera: Conjunctivae normal.  Neck:     Musculoskeletal: Normal range of motion and neck supple.  Cardiovascular:     Rate and Rhythm: Normal rate and regular rhythm.     Heart sounds: No murmur.  Pulmonary:     Effort: Pulmonary effort is normal. No respiratory distress.     Breath sounds: Normal breath sounds.  Abdominal:     Palpations: Abdomen is soft.     Tenderness: There is no abdominal tenderness.  Musculoskeletal: Normal range of motion.     Right lower leg: She exhibits no tenderness. No edema.     Left lower leg: She exhibits no tenderness. No edema.  Skin:    General: Skin is warm and dry.  Neurological:     Mental Status: She is alert.  Psychiatric:        Mood and Affect: Mood normal.      UC Treatments / Results  Labs (all labs ordered are listed, but only abnormal results are displayed) Labs Reviewed - No data to display  EKG   Radiology No results found.  Procedures Procedures (including critical care time)  Medications Ordered in UC Medications - No data to display  Initial Impression / Assessment and Plan / UC Course  I have reviewed the triage vital signs and the nursing notes.  Pertinent labs & imaging results that were available during my care of the patient were reviewed by me and considered in my medical decision making (see chart for details).     Exam normal VSS and she is non toxic or ill appearing.  Appears slightly anxious EKG with normal sinus rhythm and rate Lungs clear. Most likely symptoms are anxiety realted Will trial hydroxyzine to use as needed recommended follow up with primary care for more long term management.    Final Clinical Impressions(s) / UC Diagnoses   Final diagnoses:   Anxiety state     Discharge Instructions  I believe that your symptoms are anxiety related. I am prescribing hydroxyzine you can take this every 8 hours as needed for anxiety or acute panic attack. If this is a problem that persist you may want to think about starting on a long-term anxiety medication You will need to follow-up with your primary care provider for this.    ED Prescriptions    Medication Sig Dispense Auth. Provider   hydrOXYzine (ATARAX/VISTARIL) 25 MG tablet Take 1 tablet (25 mg total) by mouth every 8 (eight) hours as needed for anxiety. 21 tablet Loura Halt A, NP     Controlled Substance Prescriptions [place] Controlled Substance Registry consulted? Not Applicable   Orvan July, NP 01/07/19 [phone]

## 2019-01-18 DIAGNOSIS — M542 Cervicalgia: Secondary | ICD-10-CM | POA: Insufficient documentation

## 2019-01-23 ENCOUNTER — Ambulatory Visit (HOSPITAL_COMMUNITY)
Admission: RE | Admit: 2019-01-23 | Discharge: 2019-01-23 | Disposition: A | Discharge status: Home / Self Care | Payer: BLUE CROSS/BLUE SHIELD | Source: Ambulatory Visit | Attending: Physician Assistant | Admitting: Physician Assistant

## 2019-01-23 ENCOUNTER — Ambulatory Visit (HOSPITAL_BASED_OUTPATIENT_CLINIC_OR_DEPARTMENT_OTHER): Payer: BLUE CROSS/BLUE SHIELD | Admitting: Physician Assistant

## 2019-01-23 ENCOUNTER — Other Ambulatory Visit: Payer: Self-pay

## 2019-01-23 DIAGNOSIS — R0781 Pleurodynia: Secondary | ICD-10-CM

## 2019-01-23 DIAGNOSIS — F419 Anxiety disorder, unspecified: Secondary | ICD-10-CM

## 2019-01-23 DIAGNOSIS — F329 Major depressive disorder, single episode, unspecified: Secondary | ICD-10-CM

## 2019-01-23 MED ORDER — METHOCARBAMOL 500 MG PO TABS: 500.0000 mg | ORAL_TABLET | Freq: Three times a day (TID) | ORAL | 0 refills | Status: DC | PRN

## 2019-01-23 MED ORDER — HYDROXYZINE HCL 25 MG PO TABS: 25.0000 mg | ORAL_TABLET | Freq: Three times a day (TID) | ORAL | 1 refills | Status: DC | PRN

## 2019-01-23 MED ORDER — MELOXICAM 15 MG PO TABS: 15.0000 mg | ORAL_TABLET | Freq: Every day | ORAL | 0 refills | Status: DC

## 2019-01-23 MED FILL — hydrOXYzine HCL 25 MG TABS: 25 | 10 days supply | Qty: 30 | Fill #0

## 2019-01-23 MED FILL — METHOCARBAMOL 500 MG TABS: 500 | 30 days supply | Qty: 90 | Fill #0

## 2019-01-23 MED FILL — MELOXICAM 15 MG TABLET: 15 | 30 days supply | Qty: 30 | Fill #0

## 2019-01-23 NOTE — Progress Notes (Signed)
Patient ID: Natalie Thornton, female   DOB: Jan 15, 1986, 33 y.o.   MRN: [id number] Virtual Visit via Telephone Note  I connected with Natalie Thornton on 01/23/19 at  2:10 PM EDT by telephone and verified that I am speaking with the correct person using two identifiers.   I discussed the limitations, risks, security and privacy concerns of performing an evaluation and management service by telephone and the availability of in person appointments. I also discussed with the patient that there may be a patient responsible charge related to this service. The patient expressed understanding and agreed to proceed.  Patient location: home My Location:  [place] office Persons on the call:  Me and the patient  History of Present Illness: R rib pain for several months.  Seems "puffy" or swollen.  Worse with movements and with touching the area.  No cough.  NKI.  No SOB.  OTC meds providing minimal relief.  Also needs RF of prn hydroxyzine.      Observations/Objective:  A&Ox3   Assessment and Plan: 1. Rib pain - meloxicam (MOBIC) 15 MG tablet; Take 1 tablet (15 mg total) by mouth daily. X 1week then prn pain  Dispense: 30 tablet; Refill: 0 - methocarbamol (ROBAXIN) 500 MG tablet; Take 1 tablet (500 mg total) by mouth every 8 (eight) hours as needed for muscle spasms.  Dispense: 90 tablet; Refill: 0 - DG Ribs Unilateral Right; Future  2. Anxiety and depression stable - hydrOXYzine (ATARAX/VISTARIL) 25 MG tablet; Take 1 tablet (25 mg total) by mouth every 8 (eight) hours as needed for anxiety.  Dispense: 30 tablet; Refill: 1    Follow Up Instructions: See pcp in 3 months;  Sooner if needed   I discussed the assessment and treatment plan with the patient. The patient was provided an opportunity to ask questions and all were answered. The patient agreed with the plan and demonstrated an understanding of the instructions.   The patient was advised to call back or seek an in-person evaluation if the  symptoms worsen or if the condition fails to improve as anticipated.  I provided 12 minutes of non-face-to-face time during this encounter.   Freeman Caldron, PA-C

## 2019-02-24 ENCOUNTER — Encounter: Payer: Self-pay | Admitting: Physician Assistant

## 2019-02-25 NOTE — Telephone Encounter (Signed)
Patient is requesting a sleep aid via mychart and a refill on her anxiety medications.

## 2019-03-07 MED FILL — hydrOXYzine HCL 25 MG TABS: 25 | 10 days supply | Qty: 30 | Fill #1

## 2019-03-08 ENCOUNTER — Ambulatory Visit (HOSPITAL_COMMUNITY)
Admission: EM | Admit: 2019-03-08 | Discharge: 2019-03-08 | Disposition: A | Discharge status: Home / Self Care | Payer: Medicaid Other | Attending: Family Medicine | Admitting: Family Medicine

## 2019-03-08 ENCOUNTER — Encounter (HOSPITAL_COMMUNITY): Payer: Self-pay

## 2019-03-08 ENCOUNTER — Telehealth: Payer: BLUE CROSS/BLUE SHIELD | Admitting: Physician Assistant

## 2019-03-08 ENCOUNTER — Other Ambulatory Visit: Payer: Self-pay

## 2019-03-08 DIAGNOSIS — F17210 Nicotine dependence, cigarettes, uncomplicated: Secondary | ICD-10-CM | POA: Insufficient documentation

## 2019-03-08 DIAGNOSIS — R05 Cough: Secondary | ICD-10-CM | POA: Insufficient documentation

## 2019-03-08 DIAGNOSIS — F419 Anxiety disorder, unspecified: Secondary | ICD-10-CM | POA: Insufficient documentation

## 2019-03-08 DIAGNOSIS — J3489 Other specified disorders of nose and nasal sinuses: Secondary | ICD-10-CM

## 2019-03-08 DIAGNOSIS — Z20822 Contact with and (suspected) exposure to covid-19: Secondary | ICD-10-CM

## 2019-03-08 DIAGNOSIS — Z79899 Other long term (current) drug therapy: Secondary | ICD-10-CM | POA: Insufficient documentation

## 2019-03-08 DIAGNOSIS — R6883 Chills (without fever): Secondary | ICD-10-CM | POA: Insufficient documentation

## 2019-03-08 DIAGNOSIS — Z20828 Contact with and (suspected) exposure to other viral communicable diseases: Secondary | ICD-10-CM | POA: Insufficient documentation

## 2019-03-08 DIAGNOSIS — R0981 Nasal congestion: Secondary | ICD-10-CM

## 2019-03-08 DIAGNOSIS — Z791 Long term (current) use of non-steroidal anti-inflammatories (NSAID): Secondary | ICD-10-CM | POA: Diagnosis not present

## 2019-03-08 DIAGNOSIS — B349 Viral infection, unspecified: Secondary | ICD-10-CM | POA: Diagnosis not present

## 2019-03-08 DIAGNOSIS — M791 Myalgia, unspecified site: Secondary | ICD-10-CM | POA: Diagnosis not present

## 2019-03-08 DIAGNOSIS — R5383 Other fatigue: Secondary | ICD-10-CM | POA: Insufficient documentation

## 2019-03-08 DIAGNOSIS — M5136 Other intervertebral disc degeneration, lumbar region: Secondary | ICD-10-CM | POA: Insufficient documentation

## 2019-03-08 MED ORDER — ALBUTEROL SULFATE HFA 108 (90 BASE) MCG/ACT IN AERS: 2.0000 | INHALATION_SPRAY | RESPIRATORY_TRACT | 0 refills | Status: DC | PRN

## 2019-03-08 MED FILL — VENTOLIN HFA 90 MCG INHALER: 108 (90 BAS | 18 days supply | Qty: 18 | Fill #0

## 2019-03-08 NOTE — Progress Notes (Signed)
E-Visit for Corona Virus Screening   Your current symptoms could be consistent with the coronavirus.  Many health care providers can now test patients at their office but not all are.  [facility] has multiple testing sites. For information on our COVID testing locations and hours go to [email or web address]  Please quarantine yourself while awaiting your test results.  We are enrolling you in our [facility] for [place] . Daily you will receive a questionnaire within the [facility] website. Our COVID 19 response team willl be monitoriing your responses daily.    COVID-19 is a respiratory illness with symptoms that are similar to the flu. Symptoms are typically mild to moderate, but there have been cases of severe illness and death due to the virus. The following symptoms may appear 2-14 days after exposure: . Fever . Cough . Shortness of breath or difficulty breathing . Chills . Repeated shaking with chills . Muscle pain . Headache . Sore throat . New loss of taste or smell . Fatigue . Congestion or runny nose . Nausea or vomiting . Diarrhea  It is vitally important that if you feel that you have an infection such as this virus or any other virus that you stay home and away from places where you may spread it to others.  You should self-quarantine for 14 days if you have symptoms that could potentially be coronavirus or have been in close contact a with a person diagnosed with COVID-19 within the last 2 weeks. You should avoid contact with people age 31 and older.   You should wear a mask or cloth face covering over your nose and mouth if you must be around other people or animals, including pets (even at home). Try to stay at least 6 feet away from other people. This will protect the people around you.  You can use medication such as A prescription inhaler called Albuterol MDI 90 mcg /actuation 2 puffs every 4 hours as needed for shortness of breath,  wheezing, cough  You may also take acetaminophen (Tylenol) as needed for fever.   Reduce your risk of any infection by using the same precautions used for avoiding the common cold or flu:  Marland Kitchen Wash your hands often with soap and warm water for at least 20 seconds.  If soap and water are not readily available, use an alcohol-based hand sanitizer with at least 60% alcohol.  . If coughing or sneezing, cover your mouth and nose by coughing or sneezing into the elbow areas of your shirt or coat, into a tissue or into your sleeve (not your hands). . Avoid shaking hands with others and consider head nods or verbal greetings only. . Avoid touching your eyes, nose, or mouth with unwashed hands.  . Avoid close contact with people who are sick. . Avoid places or events with large numbers of people in one location, like concerts or sporting events. . Carefully consider travel plans you have or are making. . If you are planning any travel outside or inside the [place], visit the CDC's Travelers' Health webpage for the latest health notices. . If you have some symptoms but not all symptoms, continue to monitor at home and seek medical attention if your symptoms worsen. . If you are having a medical emergency, call 911.  HOME CARE . Only take medications as instructed by your medical team. . Drink plenty of fluids and get plenty of rest. . A steam or ultrasonic humidifier can help if you have congestion.  GET HELP RIGHT AWAY IF YOU HAVE EMERGENCY WARNING SIGNS** FOR COVID-19. If you or someone is showing any of these signs seek emergency medical care immediately. Call 911 or proceed to your closest emergency facility if: . You develop worsening high fever. . Trouble breathing . Bluish lips or face . Persistent pain or pressure in the chest . New confusion . Inability to wake or stay awake . You cough up blood. . Your symptoms become more severe  **This list is not all possible symptoms. Contact your  medical provider for any symptoms that are sever or concerning to you.   MAKE SURE YOU   Understand these instructions.  Will watch your condition.  Will get help right away if you are not doing well or get worse.  Your e-visit answers were reviewed by a board certified advanced clinical practitioner to complete your personal care plan.  Depending on the condition, your plan could have included both over the counter or prescription medications.  If there is a problem please reply once you have received a response from your provider.  Your safety is important to [place].  If you have drug allergies check your prescription carefully.    You can use MyChart to ask questions about today's visit, request a non-urgent call back, or ask for a work or school excuse for 24 hours related to this e-Visit. If it has been greater than 24 hours you will need to follow up with your provider, or enter a new e-Visit to address those concerns. You will get an e-mail in the next two days asking about your experience.  I hope that your e-visit has been valuable and will speed your recovery. Thank you for using e-visits.   Greater than 5 minutes, yet less than 10 minutes of time have been spent researching, coordinating, and implementing care for this patient today

## 2019-03-08 NOTE — ED Provider Notes (Signed)
[place]    CSN: [id number] Arrival date & time: 03/08/19  1303      History   Chief Complaint Chief Complaint  Patient presents with  . Cough  . Fatigue  . Generalized Body Aches  . Chills    HPI Natalie Thornton is a 33 y.o. female.    Cough Cough characteristics:  Dry and non-productive Severity:  Mild Onset quality:  Gradual Duration:  3 days Timing:  Constant Progression:  Waxing and waning Context: smoke exposure and weather changes   Context: not animal exposure, not exposure to allergens, not fumes, not occupational exposure and not sick contacts   Relieved by:  Nothing Worsened by:  Nothing Ineffective treatments:  Cough suppressants and decongestant Associated symptoms: myalgias, rhinorrhea and sinus congestion   Associated symptoms: no chest pain, no chills, no diaphoresis, no ear fullness, no ear pain, no eye discharge, no fever, no headaches, no rash, no shortness of breath, no sore throat, no weight loss and no wheezing   Risk factors: no recent infection and no recent travel     Past Medical History:  Diagnosis Date  . Anxiety   . Arthritis   . DDD (degenerative disc disease)   . Degenerative disc disease, cervical   . Disc disease, degenerative, cervical   . H pylori ulcer   . H/O degenerative disc disease 2013  . History of hiatal hernia   . Pilonidal cyst     Patient Active Problem List   Diagnosis Date Noted  . S/P excision of lipoma 09/12/2018  . Lipoma of back   . Chronic bilateral thoracic back pain 07/17/2018  . DDD (degenerative disc disease), lumbar 06/14/2018  . Diabetes mellitus screening 01/06/2017  . Tobacco abuse 01/06/2017  . Anxiety and depression 01/06/2017  . Dyspnea on exertion 10/20/2016  . Cigarette smoker 10/20/2016  . Solitary pulmonary nodule 10/20/2016  . Acute contact dermatitis 10/20/2016  . Pilonidal sinus 07/05/2011    Past Surgical History:  Procedure Laterality Date  . COLONOSCOPY    .  LIPOMA EXCISION Right 08/10/2018   Procedure: EXCISION LIPOMA, RIGHT;  Surgeon: Fredirick Maudlin, MD;  Location: [facility] ORS;  Service: General;  Laterality: Right;    OB History   No obstetric history on file.      Home Medications    Prior to Admission medications   Medication Sig Start Date End Date Taking? Authorizing Provider  Doxylamine-DM (VICKS DAYQUIL/NYQUIL COUGH PO) Take by mouth.   Yes [provider]  albuterol (VENTOLIN HFA) 108 (90 Base) MCG/ACT inhaler Inhale 2 puffs into the lungs every 2 (two) hours as needed for wheezing or shortness of breath (cough). 03/08/19   Muthersbaugh, Jarrett Soho, PA-C  hydrOXYzine (ATARAX/VISTARIL) 25 MG tablet Take 1 tablet (25 mg total) by mouth every 8 (eight) hours as needed for anxiety. 01/23/19   Argentina Donovan, PA-C  meloxicam (MOBIC) 15 MG tablet Take 1 tablet (15 mg total) by mouth daily. X 1week then prn pain 01/23/19   Freeman Caldron M, PA-C  methocarbamol (ROBAXIN) 500 MG tablet Take 1 tablet (500 mg total) by mouth every 8 (eight) hours as needed for muscle spasms. 01/23/19   Argentina Donovan, PA-C    Family History Family History  Problem Relation Age of Onset  . AAA (abdominal aortic aneurysm) Maternal Grandmother   . Leukemia Maternal Grandmother   . Breast cancer Maternal Grandmother   . Head & neck cancer Paternal Grandmother  Back cancer  . Lymphoma Paternal Grandmother   . Cirrhosis Maternal Uncle   . Breast cancer Other   . Colon cancer Neg Hx   . Esophageal cancer Neg Hx   . Rectal cancer Neg Hx   . Stomach cancer Neg Hx     Social History Social History   Tobacco Use  . Smoking status: Current Every Day Smoker    Packs/day: 5.00    Years: 10.00    Pack years: 50.00    Types: Cigars  . Smokeless tobacco: Never Used  . Tobacco comment: BLACK AND MILDS  Substance Use Topics  . Alcohol use: Yes    Comment: occasionally  . Drug use: No     Allergies   Patient has no known allergies.    Review of Systems Review of Systems  Constitutional: Negative for chills, diaphoresis, fever and weight loss.  HENT: Positive for rhinorrhea. Negative for ear pain and sore throat.   Eyes: Negative for discharge.  Respiratory: Positive for cough. Negative for shortness of breath and wheezing.   Cardiovascular: Negative for chest pain.  Musculoskeletal: Positive for myalgias.  Skin: Negative for rash.  Neurological: Negative for headaches.     Physical Exam Triage Vital Signs ED Triage Vitals  Enc Vitals Group     BP 03/08/19 1329 122/70     Pulse Rate 03/08/19 1329 73     Resp 03/08/19 1329 15     Temp 03/08/19 1329 97.7 F (36.5 C)     Temp Source 03/08/19 1329 Temporal     SpO2 03/08/19 1329 99 %     Weight --      Height --      Head Circumference --      Peak Flow --      Pain Score 03/08/19 1327 8     Pain Loc --      Pain Edu? --      Excl. in [place]? --    No data found.  Updated Vital Signs BP 122/70 (BP Location: Left Arm)   Pulse 73   Temp 97.7 F (36.5 C) (Temporal)   Resp 15   SpO2 99%   Visual Acuity Right Eye Distance:   Left Eye Distance:   Bilateral Distance:    Right Eye Near:   Left Eye Near:    Bilateral Near:     Physical Exam Vitals signs and nursing note reviewed.  Constitutional:      General: She is not in acute distress.    Appearance: Normal appearance. She is not ill-appearing, toxic-appearing or diaphoretic.  HENT:     Head: Normocephalic and atraumatic.     Right Ear: Tympanic membrane and ear canal normal.     Left Ear: Tympanic membrane and ear canal normal.     Nose: Congestion and rhinorrhea present.     Mouth/Throat:     Pharynx: Oropharynx is clear.  Eyes:     Conjunctiva/sclera: Conjunctivae normal.  Neck:     Musculoskeletal: Normal range of motion.  Cardiovascular:     Rate and Rhythm: Normal rate and regular rhythm.     Pulses: Normal pulses.     Heart sounds: Normal heart sounds.  Pulmonary:     Effort:  Pulmonary effort is normal.     Breath sounds: Normal breath sounds.  Musculoskeletal: Normal range of motion.  Skin:    General: Skin is warm and dry.  Neurological:     Mental Status: She is alert.  Psychiatric:  Mood and Affect: Mood normal.      UC Treatments / Results  Labs (all labs ordered are listed, but only abnormal results are displayed) Labs Reviewed  NOVEL CORONAVIRUS, NAA (HOSP ORDER, SEND-OUT TO REF LAB; TAT 18-24 HRS)    EKG   Radiology No results found.  Procedures Procedures (including critical care time)  Medications Ordered in UC Medications - No data to display  Initial Impression / Assessment and Plan / UC Course  I have reviewed the triage vital signs and the nursing notes.  Pertinent labs & imaging results that were available during my care of the patient were reviewed by me and considered in my medical decision making (see chart for details).     Viral URI- treating symptomatically with OTC meds.  COVID testing pending.  Follow up as needed for continued or worsening symptoms  Final Clinical Impressions(s) / UC Diagnoses   Final diagnoses:  Viral illness     Discharge Instructions     I believe this is some sort of viral illness.  I don't believe this is the flu or COVID.  Most likely common cold.  You can keep taking the OTC medications  as needed for symptoms.  Flonase and zyrtec may help. You can get these over the counter.     ED Prescriptions    None     PDMP not reviewed this encounter.   Loura Halt A, NP 03/08/19 1420

## 2019-03-08 NOTE — Discharge Instructions (Signed)
I believe this is some sort of viral illness.  I don't believe this is the flu or COVID.  Most likely common cold.  You can keep taking the OTC medications  as needed for symptoms.  Flonase and zyrtec may help. You can get these over the counter.

## 2019-03-08 NOTE — ED Triage Notes (Signed)
Pt report having cough, fatigue, generalized body aches and chills x 4 days. Pt is taking DayQuil and NyQuil, without any symptoms improvement.

## 2019-03-09 ENCOUNTER — Encounter: Payer: Self-pay | Admitting: Internal Medicine

## 2019-03-09 LAB — NOVEL CORONAVIRUS, NAA (HOSP ORDER, SEND-OUT TO REF LAB; TAT 18-24 HRS): SARS-CoV-2, NAA: NOT DETECTED

## 2019-03-11 ENCOUNTER — Encounter (HOSPITAL_COMMUNITY): Payer: Self-pay

## 2019-03-13 DIAGNOSIS — M48061 Spinal stenosis, lumbar region without neurogenic claudication: Secondary | ICD-10-CM | POA: Insufficient documentation

## 2019-03-14 ENCOUNTER — Other Ambulatory Visit: Payer: Self-pay

## 2019-03-14 ENCOUNTER — Encounter (HOSPITAL_COMMUNITY): Payer: Self-pay

## 2019-03-14 ENCOUNTER — Ambulatory Visit (HOSPITAL_COMMUNITY)
Admission: EM | Admit: 2019-03-14 | Discharge: 2019-03-14 | Disposition: A | Discharge status: Home / Self Care | Payer: HRSA Program | Attending: Family Medicine | Admitting: Family Medicine

## 2019-03-14 DIAGNOSIS — J029 Acute pharyngitis, unspecified: Secondary | ICD-10-CM | POA: Insufficient documentation

## 2019-03-14 DIAGNOSIS — Z20828 Contact with and (suspected) exposure to other viral communicable diseases: Secondary | ICD-10-CM | POA: Insufficient documentation

## 2019-03-14 DIAGNOSIS — R519 Headache, unspecified: Secondary | ICD-10-CM | POA: Insufficient documentation

## 2019-03-14 DIAGNOSIS — Z20822 Contact with and (suspected) exposure to covid-19: Secondary | ICD-10-CM

## 2019-03-14 LAB — POCT RAPID STREP A: Streptococcus, Group A Screen (Direct): NEGATIVE

## 2019-03-14 NOTE — Discharge Instructions (Signed)
Take Tylenol for pain and fever Salt water gargles for the sore throat Home to rest.  You must quarantine until your Covid test is available Note to be off work until Monday

## 2019-03-14 NOTE — ED Triage Notes (Signed)
Pt report having headache and sore throat x 2 days.   Pt was tested negative for  Covid 4 days ago.

## 2019-03-14 NOTE — ED Provider Notes (Signed)
[place]    CSN: [id number] Arrival date & time: 03/14/19  1059      History   Chief Complaint Chief Complaint  Patient presents with  . Headache  . Sore Throat    HPI Natalie Thornton is a 33 y.o. female.   HPI  Patient was seen on 03/08/2019.  Coronavirus testing was done.  This was negative.  She felt completely better.  Went back to work.  Since yesterday she has had a headache and sore throat.  She states that this is a new problem.  She is feeling very tired.  Uncertain if she is had any fever.  She went to work today.  She had to leave because she felt poorly, and it was noted by her employer.  They want her checked and tested again.  No known exposure to illness.  Does live with a 72 year old.  They are not good about social distancing.  No known exposure to strep.  Past Medical History:  Diagnosis Date  . Anxiety   . Arthritis   . DDD (degenerative disc disease)   . Degenerative disc disease, cervical   . Disc disease, degenerative, cervical   . H pylori ulcer   . H/O degenerative disc disease 2013  . History of hiatal hernia   . Pilonidal cyst     Patient Active Problem List   Diagnosis Date Noted  . S/P excision of lipoma 09/12/2018  . Lipoma of back   . Chronic bilateral thoracic back pain 07/17/2018  . DDD (degenerative disc disease), lumbar 06/14/2018  . Diabetes mellitus screening 01/06/2017  . Tobacco abuse 01/06/2017  . Anxiety and depression 01/06/2017  . Dyspnea on exertion 10/20/2016  . Cigarette smoker 10/20/2016  . Solitary pulmonary nodule 10/20/2016  . Acute contact dermatitis 10/20/2016  . Pilonidal sinus 07/05/2011    Past Surgical History:  Procedure Laterality Date  . COLONOSCOPY    . LIPOMA EXCISION Right 08/10/2018   Procedure: EXCISION LIPOMA, RIGHT;  Surgeon: Fredirick Maudlin, MD;  Location: [facility] ORS;  Service: General;  Laterality: Right;    OB History   No obstetric history on file.      Home Medications    Prior to Admission medications   Medication Sig Start Date End Date Taking? Authorizing Provider  albuterol (VENTOLIN HFA) 108 (90 Base) MCG/ACT inhaler Inhale 2 puffs into the lungs every 2 (two) hours as needed for wheezing or shortness of breath (cough). 03/08/19   Muthersbaugh, Jarrett Soho, PA-C  hydrOXYzine (ATARAX/VISTARIL) 25 MG tablet Take 1 tablet (25 mg total) by mouth every 8 (eight) hours as needed for anxiety. 01/23/19   Argentina Donovan, PA-C  meloxicam (MOBIC) 15 MG tablet Take 1 tablet (15 mg total) by mouth daily. X 1week then prn pain 01/23/19   Freeman Caldron M, PA-C  methocarbamol (ROBAXIN) 500 MG tablet Take 1 tablet (500 mg total) by mouth every 8 (eight) hours as needed for muscle spasms. 01/23/19   Argentina Donovan, PA-C    Family History Family History  Problem Relation Age of Onset  . AAA (abdominal aortic aneurysm) Maternal Grandmother   . Leukemia Maternal Grandmother   . Breast cancer Maternal Grandmother   . Head & neck cancer Paternal Grandmother        Back cancer  . Lymphoma Paternal Grandmother   . Cirrhosis Maternal Uncle   . Breast cancer Other   . Colon cancer Neg Hx   . Esophageal cancer Neg Hx   .  Rectal cancer Neg Hx   . Stomach cancer Neg Hx     Social History Social History   Tobacco Use  . Smoking status: Current Every Day Smoker    Packs/day: 5.00    Years: 10.00    Pack years: 50.00    Types: Cigars  . Smokeless tobacco: Never Used  . Tobacco comment: BLACK AND MILDS  Substance Use Topics  . Alcohol use: Yes    Comment: occasionally  . Drug use: No     Allergies   Patient has no known allergies.   Review of Systems Review of Systems  Constitutional: Positive for fatigue. Negative for chills and fever.  HENT: Positive for sore throat. Negative for ear pain.   Eyes: Negative for pain and visual disturbance.  Respiratory: Negative for cough and shortness of breath.   Cardiovascular: Negative for chest pain and palpitations.   Gastrointestinal: Negative for abdominal pain and vomiting.  Genitourinary: Negative for dysuria and hematuria.  Musculoskeletal: Negative for arthralgias and back pain.  Skin: Negative for color change and rash.  Neurological: Positive for headaches. Negative for seizures and syncope.  All other systems reviewed and are negative.    Physical Exam Triage Vital Signs ED Triage Vitals  Enc Vitals Group     BP 03/14/19 1210 115/65     Pulse Rate 03/14/19 1210 69     Resp 03/14/19 1210 15     Temp 03/14/19 1210 98 F (36.7 C)     Temp Source 03/14/19 1210 Temporal     SpO2 03/14/19 1210 100 %     Weight --      Height --      Head Circumference --      Peak Flow --      Pain Score 03/14/19 1207 10     Pain Loc --      Pain Edu? --      Excl. in [place]? --    No data found.  Updated Vital Signs BP 115/65 (BP Location: Left Arm)   Pulse 69   Temp 98 F (36.7 C) (Temporal)   Resp 15   SpO2 100%   Physical Exam Constitutional:      General: She is not in acute distress.    Appearance: She is well-developed and normal weight.     Comments: Appears tired  HENT:     Head: Normocephalic and atraumatic.     Right Ear: Tympanic membrane and ear canal normal.     Left Ear: Ear canal normal.     Nose: Nose normal. No congestion.     Mouth/Throat:     Mouth: Mucous membranes are moist.     Pharynx: Posterior oropharyngeal erythema present.     Comments: Posterior pharynx moderately injected.  No exudate Eyes:     Extraocular Movements: Extraocular movements intact.     Conjunctiva/sclera: Conjunctivae normal.     Pupils: Pupils are equal, round, and reactive to light.  Neck:     Musculoskeletal: Normal range of motion.  Cardiovascular:     Rate and Rhythm: Normal rate and regular rhythm.     Heart sounds: Normal heart sounds.  Pulmonary:     Effort: Pulmonary effort is normal. No respiratory distress.     Breath sounds: Normal breath sounds. No wheezing or rales.   Abdominal:     General: There is no distension.     Palpations: Abdomen is soft. There is no mass.     Tenderness: There is  no abdominal tenderness. There is no guarding.  Musculoskeletal: Normal range of motion.  Lymphadenopathy:     Cervical: No cervical adenopathy.  Skin:    General: Skin is warm and dry.  Neurological:     Mental Status: She is alert.  Psychiatric:        Mood and Affect: Mood normal.        Behavior: Behavior normal.      UC Treatments / Results  Labs (all labs ordered are listed, but only abnormal results are displayed) Labs Reviewed  CULTURE, GROUP A STREP [facility])  POCT RAPID STREP A    EKG   Radiology No results found.  Procedures Procedures (including critical care time)  Medications Ordered in UC Medications - No data to display  Initial Impression / Assessment and Plan / UC Course  I have reviewed the triage vital signs and the nursing notes.  Pertinent labs & imaging results that were available during my care of the patient were reviewed by me and considered in my medical decision making (see chart for details).     She just had negative coronavirus testing on the second.  Unlikely that she has it today, but possible.  Her symptoms of headache and sore throat are new.  We will test her again.  Quarantine again.  Call her with her test results.  Rapid strep negative. Final Clinical Impressions(s) / UC Diagnoses   Final diagnoses:  Sore throat  Acute nonintractable headache, unspecified headache type  Suspected COVID-19 virus infection     Discharge Instructions     Take Tylenol for pain and fever Salt water gargles for the sore throat Home to rest.  You must quarantine until your Covid test is available Note to be off work until Monday   ED Prescriptions    None     PDMP not reviewed this encounter.   Raylene Everts, MD 03/14/19 [phone]

## 2019-03-15 ENCOUNTER — Encounter: Payer: Self-pay | Admitting: Internal Medicine

## 2019-03-15 ENCOUNTER — Telehealth: Payer: BLUE CROSS/BLUE SHIELD | Admitting: Emergency Medicine

## 2019-03-15 DIAGNOSIS — J069 Acute upper respiratory infection, unspecified: Secondary | ICD-10-CM

## 2019-03-15 LAB — NOVEL CORONAVIRUS, NAA (HOSP ORDER, SEND-OUT TO REF LAB; TAT 18-24 HRS): SARS-CoV-2, NAA: NOT DETECTED

## 2019-03-15 NOTE — Progress Notes (Signed)
E-Visit for Corona Virus Screening    For Outpatient testing information you may call [phone]   If you feel you need immediate assistance or are having difficulty breathing you may bee seen at an urgent care.  Your current symptoms could be consistent with the coronavirus.  Many health care providers can now test patients at their office but not all are.  [facility] has multiple testing sites. For information on our COVID testing locations and hours go to [email or web address]  Please quarantine yourself while awaiting your test results.  We are enrolling you in our [place] for [place] . Daily you will receive a questionnaire within the [facility] website. Our COVID 19 response team willl be monitoriing your responses daily.    COVID-19 is a respiratory illness with symptoms that are similar to the flu. Symptoms are typically mild to moderate, but there have been cases of severe illness and death due to the virus. The following symptoms may appear 2-14 days after exposure: . Fever . Cough . Shortness of breath or difficulty breathing . Chills . Repeated shaking with chills . Muscle pain . Headache . Sore throat . New loss of taste or smell . Fatigue . Congestion or runny nose . Nausea or vomiting . Diarrhea  It is vitally important that if you feel that you have an infection such as this virus or any other virus that you stay home and away from places where you may spread it to others.  You should self-quarantine for 14 days if you have symptoms that could potentially be coronavirus or have been in close contact a with a person diagnosed with COVID-19 within the last 2 weeks. You should avoid contact with people age 35 and older.   You should wear a mask or cloth face covering over your nose and mouth if you must be around other people or animals, including pets (even at home). Try to stay at least 6 feet away from other people. This will  protect the people around you.  You can use medication such as DayQuil or Nyquil      Reduce your risk of any infection by using the same precautions used for avoiding the common cold or flu:  Marland Kitchen Wash your hands often with soap and warm water for at least 20 seconds.  If soap and water are not readily available, use an alcohol-based hand sanitizer with at least 60% alcohol.  . If coughing or sneezing, cover your mouth and nose by coughing or sneezing into the elbow areas of your shirt or coat, into a tissue or into your sleeve (not your hands). . Avoid shaking hands with others and consider head nods or verbal greetings only. . Avoid touching your eyes, nose, or mouth with unwashed hands.  . Avoid close contact with people who are sick. . Avoid places or events with large numbers of people in one location, like concerts or sporting events. . Carefully consider travel plans you have or are making. . If you are planning any travel outside or inside the [place], visit the CDC's Travelers' Health webpage for the latest health notices. . If you have some symptoms but not all symptoms, continue to monitor at home and seek medical attention if your symptoms worsen. . If you are having a medical emergency, call 911.  HOME CARE . Only take medications as instructed by your medical team. . Drink plenty of fluids and get plenty of rest. . A steam or ultrasonic humidifier can help  if you have congestion.   GET HELP RIGHT AWAY IF YOU HAVE EMERGENCY WARNING SIGNS** FOR COVID-19. If you or someone is showing any of these signs seek emergency medical care immediately. Call 911 or proceed to your closest emergency facility if: . You develop worsening high fever. . Trouble breathing . Bluish lips or face . Persistent pain or pressure in the chest . New confusion . Inability to wake or stay awake . You cough up blood. . Your symptoms become more severe  **This list is not all possible symptoms. Contact your  medical provider for any symptoms that are sever or concerning to you.   MAKE SURE YOU   Understand these instructions.  Will watch your condition.  Will get help right away if you are not doing well or get worse.  Your e-visit answers were reviewed by a board certified advanced clinical practitioner to complete your personal care plan.  Depending on the condition, your plan could have included both over the counter or prescription medications.  If there is a problem please reply once you have received a response from your provider.  Your safety is important to [place].  If you have drug allergies check your prescription carefully.    You can use MyChart to ask questions about today's visit, request a non-urgent call back, or ask for a work or school excuse for 24 hours related to this e-Visit. If it has been greater than 24 hours you will need to follow up with your provider, or enter a new e-Visit to address those concerns. You will get an e-mail in the next two days asking about your experience.  I hope that your e-visit has been valuable and will speed your recovery. Thank you for using e-visits.   Greater than 5 but less than 10 minutes spent researching, coordinating, and implementing care for this patient today

## 2019-03-16 LAB — CULTURE, GROUP A STREP (THRC)

## 2019-03-27 ENCOUNTER — Other Ambulatory Visit: Payer: Self-pay | Admitting: Physician Assistant

## 2019-03-27 DIAGNOSIS — F419 Anxiety disorder, unspecified: Secondary | ICD-10-CM

## 2019-03-27 DIAGNOSIS — F329 Major depressive disorder, single episode, unspecified: Secondary | ICD-10-CM

## 2019-03-28 ENCOUNTER — Other Ambulatory Visit: Payer: Self-pay

## 2019-03-28 ENCOUNTER — Ambulatory Visit
Admission: RE | Admit: 2019-03-28 | Discharge: 2019-03-28 | Disposition: A | Discharge status: Home / Self Care | Payer: BLUE CROSS/BLUE SHIELD | Source: Ambulatory Visit | Attending: Physician Assistant | Admitting: Physician Assistant

## 2019-03-28 ENCOUNTER — Telehealth: Payer: Self-pay | Admitting: Internal Medicine

## 2019-03-28 DIAGNOSIS — N6489 Other specified disorders of breast: Secondary | ICD-10-CM

## 2019-03-28 MED FILL — hydrOXYzine HCL 25 MG TABS: 25 | 10 days supply | Qty: 30 | Fill #0

## 2019-03-28 NOTE — Telephone Encounter (Signed)
Refills sent

## 2019-03-28 NOTE — Telephone Encounter (Signed)
New Message  1) Medication(s) Requested (by name): hydrOXYzine (ATARAX/VISTARIL) 25 MG tablet  2) Pharmacy of Choice: [facility]  3) Special Requests:   Approved medications will be sent to the pharmacy, we will reach out if there is an issue.  Requests made after 3pm may not be addressed until the following business day!  If a patient is unsure of the name of the medication(s) please note and ask patient to call back when they are able to provide all info, do not send to responsible party until all information is available!

## 2019-04-19 MED FILL — hydrOXYzine HCL 25 MG TABS: 25 | 10 days supply | Qty: 30 | Fill #0

## 2019-04-22 ENCOUNTER — Telehealth: Payer: Self-pay | Admitting: Internal Medicine

## 2019-04-22 NOTE — Telephone Encounter (Signed)
Patient called saying the hydrOXYzine (ATARAX/VISTARIL) 25 MG tablet  Is not helping her and she would like to be prescribed another medication. Please f/u

## 2019-04-22 NOTE — Telephone Encounter (Signed)
Will forward to pcp

## 2019-04-23 NOTE — Telephone Encounter (Signed)
Natalie Thornton may you contact pt and schedule

## 2019-04-25 NOTE — Telephone Encounter (Signed)
Pt is already scheduled to discuss anxiety/medication

## 2019-04-29 NOTE — Progress Notes (Signed)
Patient ID: Natalie Thornton, female   DOB: [birth year], 33 y.o.   MRN: [id number] Virtual Visit via Telephone Note  I connected with Natalie Thornton on 04/29/19 at  9:10 AM EST by telephone and verified that I am speaking with the correct person using two identifiers.   I discussed the limitations, risks, security and privacy concerns of performing an evaluation and management service by telephone and the availability of in person appointments. I also discussed with the patient that there may be a patient responsible charge related to this service. The patient expressed understanding and agreed to proceed.  Patient location:  home My Location:  [place] office Persons on the call: me and the patient   History of Present Illness:  Having panic attacks and episodes of anxiety that seem to be occurring daily over the last 2 months.  Hydroxyzine is not really helping.  Feels like quarantine and holidays are adding to stress.  No SI/HI.  Episodes of panic occurring about once-twice per day and last 5-10 mins.  Denies CP.  Worries more than usual.  Also, some pressure and frequency of urination.  She denies f/c.  No dysuria.  No pelvic pain    Observations/Objective:  NAD.     Assessment and Plan: 1. Anxiety and depression Self care, proper diet, exercise, water intake counseling reviewed - Thyroid Panel With TSH; Future - Vitamin D, 25-hydroxy; Future - CBC with Differential; Future - busPIRone (BUSPAR) 15 MG tablet; Take 1 tablet (15 mg total) by mouth 2 (two) times daily.  Dispense: 60 tablet; Refill: 2  2. Frequency of urination - POCT URINALYSIS DIP (CLINITEK)   Follow Up Instructions: With PCP in 1 month   I discussed the assessment and treatment plan with the patient. The patient was provided an opportunity to ask questions and all were answered. The patient agreed with the plan and demonstrated an understanding of the instructions.   The patient was advised to call back or seek an  in-person evaluation if the symptoms worsen or if the condition fails to improve as anticipated.  I provided 14 minutes of non-face-to-face time during this encounter.   Freeman Caldron, PA-C

## 2019-04-30 ENCOUNTER — Other Ambulatory Visit: Payer: Self-pay

## 2019-04-30 ENCOUNTER — Ambulatory Visit: Payer: Self-pay | Attending: Internal Medicine | Admitting: Physician Assistant

## 2019-04-30 DIAGNOSIS — R350 Frequency of micturition: Secondary | ICD-10-CM

## 2019-04-30 DIAGNOSIS — F419 Anxiety disorder, unspecified: Secondary | ICD-10-CM

## 2019-04-30 DIAGNOSIS — F329 Major depressive disorder, single episode, unspecified: Secondary | ICD-10-CM

## 2019-04-30 MED ORDER — BUSPIRONE HCL 15 MG PO TABS: 15.0000 mg | ORAL_TABLET | Freq: Two times a day (BID) | ORAL | 2 refills | Status: DC

## 2019-05-01 ENCOUNTER — Other Ambulatory Visit: Payer: Self-pay

## 2019-05-01 ENCOUNTER — Ambulatory Visit: Payer: Self-pay | Attending: Internal Medicine

## 2019-05-01 DIAGNOSIS — F419 Anxiety disorder, unspecified: Secondary | ICD-10-CM

## 2019-05-01 DIAGNOSIS — F329 Major depressive disorder, single episode, unspecified: Secondary | ICD-10-CM

## 2019-05-01 DIAGNOSIS — F32A Depression, unspecified: Secondary | ICD-10-CM

## 2019-05-02 LAB — CBC WITH DIFFERENTIAL/PLATELET
Basophils Absolute: 0 10*3/uL (ref 0.0–0.2)
Basos: 0 %
EOS (ABSOLUTE): 0.1 10*3/uL (ref 0.0–0.4)
Eos: 1 %
Hematocrit: 41.8 % (ref 34.0–46.6)
Hemoglobin: 14 g/dL (ref 11.1–15.9)
Immature Grans (Abs): 0 10*3/uL (ref 0.0–0.1)
Immature Granulocytes: 0 %
Lymphocytes Absolute: 1.8 10*3/uL (ref 0.7–3.1)
Lymphs: 40 %
MCH: 30.4 pg (ref 26.6–33.0)
MCHC: 33.5 g/dL (ref 31.5–35.7)
MCV: 91 fL (ref 79–97)
Monocytes Absolute: 0.4 10*3/uL (ref 0.1–0.9)
Monocytes: 9 %
Neutrophils Absolute: 2.3 10*3/uL (ref 1.4–7.0)
Neutrophils: 50 %
Platelets: 255 10*3/uL (ref 150–450)
RBC: 4.61 x10E6/uL (ref 3.77–5.28)
RDW: 11.8 % (ref 11.7–15.4)
WBC: 4.6 10*3/uL (ref 3.4–10.8)

## 2019-05-02 LAB — VITAMIN D 25 HYDROXY (VIT D DEFICIENCY, FRACTURES): Vit D, 25-Hydroxy: 12.7 ng/mL — ABNORMAL LOW (ref 30.0–100.0)

## 2019-05-02 LAB — THYROID PANEL WITH TSH
Free Thyroxine Index: 1.7 (ref 1.2–4.9)
T3 Uptake Ratio: 28 % (ref 24–39)
T4, Total: 6.1 ug/dL (ref 4.5–12.0)
TSH: 2.32 u[IU]/mL (ref 0.450–4.500)

## 2019-05-06 ENCOUNTER — Telehealth: Payer: Self-pay | Admitting: Internal Medicine

## 2019-05-06 NOTE — Telephone Encounter (Signed)
Patient calling in regards to results

## 2019-05-06 NOTE — Telephone Encounter (Signed)
Once results is resulted a nurse will contact her

## 2019-05-08 ENCOUNTER — Other Ambulatory Visit: Payer: Self-pay | Admitting: Physician Assistant

## 2019-05-08 MED ORDER — VITAMIN D (ERGOCALCIFEROL) 1.25 MG (50000 UNIT) PO CAPS: 50000.0000 [IU] | ORAL_CAPSULE | ORAL | 0 refills | Status: DC

## 2019-05-08 MED FILL — hydrOXYzine HCL 25 MG TABS: 25 | 10 days supply | Qty: 30 | Fill #1

## 2019-05-08 MED FILL — VIT D2 1.25 MG (50,000 UNIT: 1.25 MG | 24 days supply | Qty: 4 | Fill #0

## 2019-05-08 MED FILL — busPIRone HCL 15 MG TABS: 15 | 30 days supply | Qty: 60 | Fill #0

## 2019-05-16 ENCOUNTER — Encounter (HOSPITAL_COMMUNITY): Payer: Self-pay

## 2019-05-16 ENCOUNTER — Ambulatory Visit (HOSPITAL_COMMUNITY)
Admission: EM | Admit: 2019-05-16 | Discharge: 2019-05-16 | Disposition: A | Discharge status: Home / Self Care | Payer: Medicaid Other | Attending: Family Medicine | Admitting: Family Medicine

## 2019-05-16 ENCOUNTER — Other Ambulatory Visit: Payer: Self-pay

## 2019-05-16 DIAGNOSIS — R2231 Localized swelling, mass and lump, right upper limb: Secondary | ICD-10-CM

## 2019-05-16 MED ORDER — DICLOFENAC SODIUM 1 % EX GEL: 2.0000 g | Freq: Four times a day (QID) | CUTANEOUS | 0 refills | Status: DC

## 2019-05-16 MED ORDER — TRAMADOL HCL 50 MG PO TABS: 50.0000 mg | ORAL_TABLET | Freq: Four times a day (QID) | ORAL | 0 refills | Status: DC | PRN

## 2019-05-16 MED FILL — hydrOXYzine HCL 25 MG TABS: 25 | 10 days supply | Qty: 30 | Fill #1

## 2019-05-16 MED FILL — busPIRone HCL 15 MG TABS: 15 | 30 days supply | Qty: 60 | Fill #0

## 2019-05-16 NOTE — ED Triage Notes (Signed)
Patient presents to Urgent Care with complaints of "lump" on her right upper arm since about 4 months ago. Patient reports it swells worse when she lays on it, has been evaluated for same in the past. Pt has not noticed skin discoloration, states it makes her whole arm hurt including shoulder. Requesting work note.

## 2019-05-16 NOTE — Discharge Instructions (Addendum)
Be aware, pain medications may cause drowsiness. Please do not drive, operate heavy machinery or make important decisions while on this medication, it can cloud your judgement.  

## 2019-05-21 NOTE — ED Provider Notes (Signed)
[facility]   [id number] 05/16/19 Arrival Time: 1700  ASSESSMENT & PLAN:  1. Mass of arm, right     Question lipoma. Discussed. No indication for plain imaging of her arm. Ultimately, recommend that she see a surgeon for further imaging/evaluation.  For discomfort: Meds ordered this encounter  Medications  . traMADol (ULTRAM) 50 MG tablet    Sig: Take 1 tablet (50 mg total) by mouth every 6 (six) hours as needed.    Dispense:  15 tablet    Refill:  0  . diclofenac Sodium (VOLTAREN) 1 % GEL    Sig: Apply 2 g topically 4 (four) times daily.    Dispense:  150 g    Refill:  0     Recommend: Follow-up Information    Schedule an appointment as soon as possible for a visit  with Surgery, [place].   Specialty: General Surgery Contact information: [address] [place] [address] [phone]           [place] Controlled Substances Registry consulted for this patient. I feel the risk/benefit ratio today is favorable for proceeding with this prescription for a controlled substance. Medication sedation precautions given.  Reviewed expectations re: course of current medical issues. Questions answered. Outlined signs and symptoms indicating need for more acute intervention. Patient verbalized understanding. After Visit Summary given.  SUBJECTIVE: History from: patient. Natalie Thornton is a 33 y.o. female who reports a "lump" on her R upper arm. First noted 4-6 months ago; questions if there longer. On/off associated. No injury/trauma to area. Feels that size has remained stable. No extremity ROM loss. No extremity sensation changes or weakness. OTC analgesics without much relief. Hurts when pressure applied to area. Does not wake her at night. No specific alleviating factors reported. No overlying skin changes.  Past Surgical History:  Procedure Laterality Date  . COLONOSCOPY    . LIPOMA EXCISION Right 08/10/2018   Procedure: EXCISION LIPOMA,  RIGHT;  Surgeon: Fredirick Maudlin, MD;  Location: [facility] ORS;  Service: General;  Laterality: Right;     ROS: As per HPI. All other systems negative.    OBJECTIVE:  Vitals:   05/16/19 1811  BP: 114/67  Pulse: 71  Resp: 15  Temp: 98.9 F (37.2 C)  TempSrc: Oral  SpO2: 100%    General appearance: alert; no distress HEENT: [place]; AT Neck: supple with FROM Resp: unlabored respirations Extremities: . RUE: warm with well perfused appearance; I do feel a subtle mass over her R deltoid, approx 3x3 cm; soft; non-mobile; no overlying skin changes; surrounding area without swelling; normal shoulder ROM CV: brisk extremity capillary refill of RUE; 2+ radial pulse of RUE. Skin: warm and dry; no visible rashes Neurologic: gait normal; normal reflexes of RUE; normal sensation of RUE; normal strength of RUE Psychological: alert and cooperative; normal mood and affect    No Known Allergies  Past Medical History:  Diagnosis Date  . Anxiety   . Arthritis   . DDD (degenerative disc disease)   . Degenerative disc disease, cervical   . Disc disease, degenerative, cervical   . H pylori ulcer   . H/O degenerative disc disease 2013  . History of hiatal hernia   . Pilonidal cyst    Social History   Socioeconomic History  . Marital status: Single    Spouse name: Not on file  . Number of children: 2  . Years of education: Not on file  . Highest education level: Not on  file  Occupational History  . Occupation: [employer]   Tobacco Use  . Smoking status: Former Smoker    Packs/day: 5.00    Years: 10.00    Pack years: 50.00    Types: Cigars  . Smokeless tobacco: Never Used  . Tobacco comment: BLACK AND MILDS  Substance and Sexual Activity  . Alcohol use: Yes    Comment: occasionally  . Drug use: No  . Sexual activity: Yes  Other Topics Concern  . Not on file  Social History Narrative  . Not on file   Social Determinants of Health   Financial Resource Strain:   . Difficulty  of Paying Living Expenses: Not on file  Food Insecurity:   . Worried About Charity fundraiser in the Last Year: Not on file  . Ran Out of Food in the Last Year: Not on file  Transportation Needs:   . Lack of Transportation (Medical): Not on file  . Lack of Transportation (Non-Medical): Not on file  Physical Activity:   . Days of Exercise per Week: Not on file  . Minutes of Exercise per Session: Not on file  Stress:   . Feeling of Stress : Not on file  Social Connections:   . Frequency of Communication with Friends and Family: Not on file  . Frequency of Social Gatherings with Friends and Family: Not on file  . Attends Religious Services: Not on file  . Active Member of Clubs or Organizations: Not on file  . Attends Archivist Meetings: Not on file  . Marital Status: Not on file   Family History  Problem Relation Age of Onset  . Healthy Mother   . Healthy Father   . AAA (abdominal aortic aneurysm) Maternal Grandmother   . Leukemia Maternal Grandmother   . Breast cancer Maternal Grandmother   . Head & neck cancer Paternal Grandmother        Back cancer  . Lymphoma Paternal Grandmother   . Cirrhosis Maternal Uncle   . Breast cancer Other   . Colon cancer Neg Hx   . Esophageal cancer Neg Hx   . Rectal cancer Neg Hx   . Stomach cancer Neg Hx    Past Surgical History:  Procedure Laterality Date  . COLONOSCOPY    . LIPOMA EXCISION Right 08/10/2018   Procedure: EXCISION LIPOMA, RIGHT;  Surgeon: Fredirick Maudlin, MD;  Location: [facility] ORS;  Service: General;  Laterality: Right;      Vanessa Kick, MD 05/21/19 [phone]

## 2019-05-22 ENCOUNTER — Other Ambulatory Visit: Payer: Self-pay

## 2019-05-22 ENCOUNTER — Ambulatory Visit: Payer: Self-pay | Attending: Internal Medicine | Admitting: Physician Assistant

## 2019-05-22 DIAGNOSIS — D1721 Benign lipomatous neoplasm of skin and subcutaneous tissue of right arm: Secondary | ICD-10-CM

## 2019-05-22 DIAGNOSIS — Z09 Encounter for follow-up examination after completed treatment for conditions other than malignant neoplasm: Secondary | ICD-10-CM

## 2019-05-22 NOTE — Progress Notes (Signed)
Virtual Visit via Telephone Note  I connected with Natalie Thornton on 05/22/19 at  1:30 PM EST by telephone and verified that I am speaking with the correct person using two identifiers.   I discussed the limitations, risks, security and privacy concerns of performing an evaluation and management service by telephone and the availability of in person appointments. I also discussed with the patient that there may be a patient responsible charge related to this service. The patient expressed understanding and agreed to proceed.  Patient location: home My Location:  [place] office Persons on the call:  Me and the patient  History of Present Illness: patient with mass on R arm.  UC thinks it is a lipoma-she was seen there 05/16/2019.  It is causing her some pain.      Observations/Objective:  NAD   Assessment and Plan: 1. Lipoma of right upper extremity - Ambulatory referral to General Surgery  2. Hospital discharge follow-up    Follow Up Instructions: prn   I discussed the assessment and treatment plan with the patient. The patient was provided an opportunity to ask questions and all were answered. The patient agreed with the plan and demonstrated an understanding of the instructions.   The patient was advised to call back or seek an in-person evaluation if the symptoms worsen or if the condition fails to improve as anticipated.  I provided 6 minutes of non-face-to-face time during this encounter.   Freeman Caldron, PA-C  Patient ID: Natalie Thornton, female   DOB: [birth year], 33 y.o.   MRN: [id number]

## 2019-06-05 DIAGNOSIS — D171 Benign lipomatous neoplasm of skin and subcutaneous tissue of trunk: Secondary | ICD-10-CM

## 2019-06-09 ENCOUNTER — Other Ambulatory Visit: Payer: Self-pay

## 2019-06-09 ENCOUNTER — Ambulatory Visit (HOSPITAL_COMMUNITY)
Admission: EM | Admit: 2019-06-09 | Discharge: 2019-06-09 | Disposition: A | Discharge status: Home / Self Care | Payer: Medicaid Other | Attending: Urgent Care | Admitting: Urgent Care

## 2019-06-09 ENCOUNTER — Encounter (HOSPITAL_COMMUNITY): Payer: Self-pay

## 2019-06-09 DIAGNOSIS — G8929 Other chronic pain: Secondary | ICD-10-CM

## 2019-06-09 DIAGNOSIS — M545 Low back pain, unspecified: Secondary | ICD-10-CM

## 2019-06-09 DIAGNOSIS — M546 Pain in thoracic spine: Secondary | ICD-10-CM

## 2019-06-09 DIAGNOSIS — R1084 Generalized abdominal pain: Secondary | ICD-10-CM

## 2019-06-09 MED ORDER — TRAMADOL HCL 50 MG PO TABS: 50.0000 mg | ORAL_TABLET | Freq: Four times a day (QID) | ORAL | 0 refills | Status: DC | PRN

## 2019-06-09 MED ORDER — OMEPRAZOLE 20 MG PO CPDR: 20.0000 mg | DELAYED_RELEASE_CAPSULE | Freq: Two times a day (BID) | ORAL | 0 refills | Status: DC

## 2019-06-09 MED ORDER — SUCRALFATE 1 GM/10ML PO SUSP: 1.0000 g | Freq: Three times a day (TID) | ORAL | 0 refills | Status: DC

## 2019-06-09 NOTE — Discharge Instructions (Addendum)
You may take 500mg -650mg  Tylenol every 6 hours for your back pain and inflammation. If you still have pain despite scheduling Tylenol, this is breakthrough pain. For this, I want you to try Tramadol, a controlled substance. You can use Tramadol once every 8 hours but once your pain is controlled then go right back to just Tylenol.   Do not take any other NSAID including diclofenac, ibuprofen, naproxen, Aleve, Motrin, Advil as I am afraid you are developing an ulcer from regular use of these medications.

## 2019-06-09 NOTE — ED Provider Notes (Signed)
[facility]   MRN: [id number] DOB: September 06, 1985  Subjective:   Natalie Thornton is a 34 y.o. female presenting for ~2 month history of persistent mid abdominal pain, is intermittent but daily. Sx worse after eating or drinking anything, also happens without eating or drinking. Resolves on its own after several hours, no relieving factors or medications. She did try ibuprofen, muscle relaxer, Aleve, Goody Powders, APAP.  Has used NSAIDs regularly for her pain. Uses a heating pad. Patient has chronic back pain, admits that she regularly uses these medications. 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    No current facility-administered medications for this encounter.  Current Outpatient Medications:  .  albuterol (VENTOLIN HFA) 108 (90 Base) MCG/ACT inhaler, Inhale 2 puffs into the lungs every 2 (two) hours as needed for wheezing or shortness of breath (cough)., Disp: 8 g, Rfl: 0 .  busPIRone (BUSPAR) 15 MG tablet, Take 1 tablet (15 mg total) by mouth 2 (two) times daily., Disp: 60 tablet, Rfl: 2 .  diclofenac Sodium (VOLTAREN) 1 % GEL, Apply 2 g topically 4 (four) times daily., Disp: 150 g, Rfl: 0 .  hydrOXYzine (ATARAX/VISTARIL) 25 MG tablet, TAKE 1 TABLET (25 MG TOTAL) BY MOUTH EVERY 8 (EIGHT) HOURS AS NEEDED FOR ANXIETY., Disp: 30 tablet, Rfl: 1 .  meloxicam (MOBIC) 15 MG tablet, Take 1 tablet (15 mg total) by mouth daily. X 1week then prn pain, Disp: 30 tablet, Rfl: 0 .  methocarbamol (ROBAXIN) 500 MG tablet, Take 1 tablet (500 mg total) by mouth every 8 (eight) hours as needed for muscle spasms., Disp: 90 tablet, Rfl: 0 .  traMADol (ULTRAM) 50 MG tablet, Take 1 tablet (50 mg total) by mouth every 6 (six) hours as needed., Disp: 15 tablet, Rfl: 0 .  Vitamin D, Ergocalciferol, (DRISDOL) 1.25 MG (50000 UT) CAPS capsule, Take 1 capsule (50,000 Units total) by mouth every 7 (seven) days., Disp: 16 capsule, Rfl: 0    No Known Allergies   Past Medical History:    Diagnosis Date  . Anxiety   . Arthritis   . DDD (degenerative disc disease)   . Degenerative disc disease, cervical   . Disc disease, degenerative, cervical   . H pylori ulcer   . H/O degenerative disc disease 2013  . History of hiatal hernia   . Pilonidal cyst      Past Surgical History:  Procedure Laterality Date  . COLONOSCOPY    . LIPOMA EXCISION Right 08/10/2018   Procedure: EXCISION LIPOMA, RIGHT;  Surgeon: Fredirick Maudlin, MD;  Location: [facility] ORS;  Service: General;  Laterality: Right;    Family History  Problem Relation Age of Onset  . Healthy Mother   . Healthy Father   . AAA (abdominal aortic aneurysm) Maternal Grandmother   . Leukemia Maternal Grandmother   . Breast cancer Maternal Grandmother   . Head & neck cancer Paternal Grandmother        Back cancer  . Lymphoma Paternal Grandmother   . Cirrhosis Maternal Uncle   . Breast cancer Other   . Colon cancer Neg Hx   . Esophageal cancer Neg Hx   . Rectal cancer Neg Hx   . Stomach cancer Neg Hx     Social History   Tobacco Use  . Smoking status: Former Smoker    Packs/day: 5.00    Years: 10.00    Pack years: 50.00    Types: Cigars  . Smokeless tobacco: Never Used  .  Tobacco comment: BLACK AND MILDS  Substance Use Topics  . Alcohol use: Yes    Comment: occasionally  . Drug use: No    Review of Systems  Constitutional: Negative for fever and malaise/fatigue.  HENT: Negative for congestion, ear pain, sinus pain and sore throat.   Eyes: Negative for discharge and redness.  Respiratory: Negative for cough, hemoptysis, shortness of breath and wheezing.   Cardiovascular: Negative for chest pain.  Gastrointestinal: Positive for abdominal pain. Negative for blood in stool, constipation, diarrhea, nausea and vomiting.  Genitourinary: Negative for dysuria, flank pain and hematuria.  Musculoskeletal: Positive for back pain. Negative for myalgias.  Skin: Negative for rash.  Neurological: Negative for  dizziness, weakness and headaches.  Psychiatric/Behavioral: Negative for depression and substance abuse.     Objective:   Vitals: BP 136/80 (BP Location: Right Arm)   Pulse 82   Temp 99.2 F (37.3 C) (Oral)   Resp 18   Wt 145 lb (65.8 kg)   LMP 05/08/2019   SpO2 100%   BMI 28.32 kg/m   Physical Exam Constitutional:      General: She is not in acute distress.    Appearance: Normal appearance. She is well-developed and normal weight. She is not ill-appearing, toxic-appearing or diaphoretic.  HENT:     Head: Normocephalic and atraumatic.     Right Ear: External ear normal.     Left Ear: External ear normal.     Nose: Nose normal.     Mouth/Throat:     Mouth: Mucous membranes are moist.     Pharynx: Oropharynx is clear.  Eyes:     General: No scleral icterus.    Extraocular Movements: Extraocular movements intact.     Pupils: Pupils are equal, round, and reactive to light.  Cardiovascular:     Rate and Rhythm: Normal rate and regular rhythm.     Heart sounds: Normal heart sounds. No murmur. No friction rub. No gallop.   Pulmonary:     Effort: Pulmonary effort is normal. No respiratory distress.     Breath sounds: Normal breath sounds. No stridor. No wheezing, rhonchi or rales.  Abdominal:     General: Bowel sounds are normal. There is no distension.     Palpations: Abdomen is soft. There is no mass.     Tenderness: There is generalized abdominal tenderness (throughout). There is no right CVA tenderness, left CVA tenderness, guarding or rebound.  Skin:    General: Skin is warm and dry.     Coloration: Skin is not pale.     Findings: No rash.  Neurological:     General: No focal deficit present.     Mental Status: She is alert and oriented to person, place, and time.  Psychiatric:        Mood and Affect: Mood normal.        Behavior: Behavior normal.        Thought Content: Thought content normal.        Judgment: Judgment normal.     Assessment and Plan :   1.  Generalized abdominal pain   2. Chronic bilateral thoracic back pain   3. Chronic bilateral low back pain without sciatica     Have a high suspicion the patient has PUD from chronic NSAID use.  Recommend the patient stop all NSAID use now, switch to scheduled Tylenol with tramadol for breakthrough pain.  We will have patient start omeprazole and sucralfate.  Patient is to follow-up with her back  specialist and her PCP, consider referral to GI doctor for endoscopy. Counseled patient on potential for adverse effects with medications prescribed/recommended today, ER and return-to-clinic precautions discussed, patient verbalized understanding.    Jaynee Eagles, [place] 06/09/19 1623

## 2019-06-09 NOTE — ED Triage Notes (Signed)
Pt states she has stomach pains since Nov 2020. Pt states she has tried the OTC meds and they don't work.

## 2019-06-13 ENCOUNTER — Ambulatory Visit: Payer: Self-pay | Attending: Internal Medicine | Admitting: Internal Medicine

## 2019-06-13 ENCOUNTER — Other Ambulatory Visit: Payer: Self-pay

## 2019-06-13 DIAGNOSIS — R1013 Epigastric pain: Secondary | ICD-10-CM

## 2019-06-13 DIAGNOSIS — M51369 Other intervertebral disc degeneration, lumbar region without mention of lumbar back pain or lower extremity pain: Secondary | ICD-10-CM

## 2019-06-13 DIAGNOSIS — G8929 Other chronic pain: Secondary | ICD-10-CM

## 2019-06-13 DIAGNOSIS — M5136 Other intervertebral disc degeneration, lumbar region: Secondary | ICD-10-CM

## 2019-06-13 DIAGNOSIS — M545 Low back pain, unspecified: Secondary | ICD-10-CM

## 2019-06-13 NOTE — Progress Notes (Signed)
Virtual Visit via Telephone Note Due to current restrictions/limitations of in-office visits due to the COVID-19 pandemic, this scheduled clinical appointment was converted to a telehealth visit  I connected with Natalie Thornton on 06/13/19 at 2:03 p.m by telephone and verified that I am speaking with the correct person using two identifiers. I am in my office.  The patient is at home.  Only the patient and myself participated in this encounter.  I discussed the limitations, risks, security and privacy concerns of performing an evaluation and management service by telephone and the availability of in person appointments. I also discussed with the patient that there may be a patient responsible charge related to this service. The patient expressed understanding and agreed to proceed.   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  Pain in epigastric area -"anything I eat and drink can make it hurt."  Some nausea with vomiting x 1 about 2 days. No vomiting since then.  No diarrhea. Pt was taking a lot of Ibuprofen  2 x a day for back pain.  Also takes Good Powder.  She was seen at urgent care recently for gastrointestinal symptoms.  Told she may have peptic ulcer disease and referral to GI recommended.  She was told to stop NSAIDs.  Placed on Tramadol.  Started on Omeprazole 20 mg BID and Carafate. She is taking the Omeprazole once a day.  Patient had endoscopy 04/2018.  She is also had colonoscopy in the past.  CT of the abdomen done in November 2018 did not reveal any gallstones..   She recently had been seeing a back specialist named Dr. Georganna Skeans in [facility] for her chronic mid to lower back pain.  She reports that she had 2 injections to the back which did not help.  She states that he had talked about her  possibly needing surgery to her back but wants her to quit smoking.  She is not pleased with him and plans to discontinue going.  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 Outpatient Encounter Medications as of 06/13/2019  Medication Sig  . busPIRone (BUSPAR) 15 MG tablet Take 1 tablet (15 mg total) by mouth 2 (two) times daily.  . hydrOXYzine (ATARAX/VISTARIL) 25 MG tablet TAKE 1 TABLET (25 MG TOTAL) BY MOUTH EVERY 8 (EIGHT) HOURS AS NEEDED FOR ANXIETY.  . traMADol (ULTRAM) 50 MG tablet Take 1 tablet (50 mg total) by mouth every 6 (six) hours as needed.  Marland Kitchen albuterol (VENTOLIN HFA) 108 (90 Base) MCG/ACT inhaler Inhale 2 puffs into the lungs every 2 (two) hours as needed for wheezing or shortness of breath (cough). (Patient not taking: Reported on 06/13/2019)  . diclofenac Sodium (VOLTAREN) 1 % GEL Apply 2 g topically 4 (four) times daily. (Patient not taking: Reported on 06/13/2019)  . methocarbamol (ROBAXIN) 500 MG tablet Take 1 tablet (500 mg total) by mouth every 8 (eight) hours as needed for muscle spasms. (Patient not taking: Reported on 06/13/2019)  . omeprazole (PRILOSEC) 20 MG capsule Take 1 capsule (20 mg total) by mouth 2 (two) times daily before a meal. (Patient not taking: Reported on 06/13/2019)  . sucralfate (CARAFATE) 1 GM/10ML suspension Take 10 mLs (1 g total) by mouth 4 (four) times daily -  with meals and at bedtime. (Patient not taking: Reported on 06/13/2019)  . Vitamin D, Ergocalciferol, (DRISDOL) 1.25 MG (50000 UT) CAPS capsule Take 1 capsule (50,000 Units total) by mouth every 7 (seven) days. (Patient not taking: Reported on 06/13/2019)   No facility-administered encounter medications on file as of 06/13/2019.      Observations/Objective: Results for orders placed or performed in visit on 05/01/19  CBC with Differential  Result Value Ref Range   WBC 4.6 3.4 - 10.8 x10E3/uL   RBC 4.61 3.77 -  5.28 x10E6/uL   Hemoglobin 14.0 11.1 - 15.9 g/dL   Hematocrit 41.8 34.0 - 46.6 %   MCV 91 79 - 97 fL   MCH 30.4 26.6 - 33.0 pg   MCHC 33.5 31.5 - 35.7 g/dL   RDW 11.8 11.7 - 15.4 %   Platelets 255 150 - 450 x10E3/uL   Neutrophils 50 Not Estab. %   Lymphs 40 Not Estab. %   Monocytes 9 Not Estab. %   Eos 1 Not Estab. %   Basos 0 Not Estab. %   Neutrophils Absolute 2.3 1.4 - 7.0 x10E3/uL   Lymphocytes Absolute 1.8 0.7 - 3.1 x10E3/uL   Monocytes Absolute 0.4 0.1 - 0.9 x10E3/uL   EOS (ABSOLUTE) 0.1 0.0 - 0.4 x10E3/uL   Basophils Absolute 0.0 0.0 - 0.2 x10E3/uL   Immature Granulocytes 0 Not Estab. %   Immature Grans (Abs) 0.0 0.0 - 0.1 x10E3/uL  Vitamin D, 25-hydroxy  Result Value Ref Range   Vit D, 25-Hydroxy 12.7 (L) 30.0 - 100.0 ng/mL  Thyroid Panel With TSH  Result Value Ref Range   TSH 2.320 0.450 - 4.500 uIU/mL   T4, Total 6.1 4.5 - 12.0 ug/dL   T3 Uptake Ratio 28 24 - 39 %   Free Thyroxine Index 1.7 1.2 - 4.9     Assessment and Plan: 1. Epigastric pain Recommend taking the omeprazole twice a day as prescribed. -Advised take patient to take omeprazole twice a day as prescribed.  She will hold off on NSAIDs - Ambulatory referral to Gastroenterology  2. Chronic low back pain without sciatica, unspecified back pain laterality I recommend referral to pain specialist for her chronic and ongoing issues with back pain - Ambulatory referral to Pain Clinic  3. Degeneration of intervertebral disc of lumbar region See #2 above - Ambulatory referral to Pain Clinic   Follow Up Instructions: PRN   I discussed the assessment and treatment plan with the patient. The patient was provided an opportunity to ask questions and all were answered. The patient agreed with the plan and demonstrated an understanding of the instructions.   The patient was advised to call back or seek an in-person evaluation if the symptoms worsen or if the condition fails to improve as anticipated.  I  provided 14 minutes of non-face-to-face time during this encounter.   Karle Plumber, MD

## 2019-06-15 ENCOUNTER — Other Ambulatory Visit: Payer: Self-pay

## 2019-06-15 ENCOUNTER — Ambulatory Visit (HOSPITAL_COMMUNITY)
Admission: EM | Admit: 2019-06-15 | Discharge: 2019-06-15 | Disposition: A | Discharge status: Home / Self Care | Payer: Medicaid Other | Attending: Emergency Medicine | Admitting: Emergency Medicine

## 2019-06-15 ENCOUNTER — Encounter (HOSPITAL_COMMUNITY): Payer: Self-pay | Admitting: *Deleted

## 2019-06-15 DIAGNOSIS — N390 Urinary tract infection, site not specified: Secondary | ICD-10-CM

## 2019-06-15 DIAGNOSIS — B9689 Other specified bacterial agents as the cause of diseases classified elsewhere: Secondary | ICD-10-CM

## 2019-06-15 DIAGNOSIS — R310 Gross hematuria: Secondary | ICD-10-CM

## 2019-06-15 DIAGNOSIS — R350 Frequency of micturition: Secondary | ICD-10-CM

## 2019-06-15 DIAGNOSIS — Z3202 Encounter for pregnancy test, result negative: Secondary | ICD-10-CM

## 2019-06-15 LAB — CBG MONITORING, ED: Glucose-Capillary: 82 mg/dL (ref 70–99)

## 2019-06-15 LAB — POCT URINALYSIS DIP (DEVICE)
Bilirubin Urine: NEGATIVE
Glucose, UA: NEGATIVE mg/dL
Ketones, ur: NEGATIVE mg/dL
Nitrite: NEGATIVE
Protein, ur: 30 mg/dL — AB
Specific Gravity, Urine: 1.02 (ref 1.005–1.030)
Urobilinogen, UA: 0.2 mg/dL (ref 0.0–1.0)
pH: 7.5 (ref 5.0–8.0)

## 2019-06-15 LAB — GLUCOSE, CAPILLARY: Glucose-Capillary: 82 mg/dL (ref 70–99)

## 2019-06-15 LAB — POCT PREGNANCY, URINE: Preg Test, Ur: NEGATIVE

## 2019-06-15 LAB — POC URINE PREG, ED: Preg Test, Ur: NEGATIVE

## 2019-06-15 MED ORDER — CEPHALEXIN 500 MG PO CAPS: 500.0000 mg | ORAL_CAPSULE | Freq: Two times a day (BID) | ORAL | 0 refills | Status: AC

## 2019-06-15 NOTE — ED Triage Notes (Signed)
No response from waiting area. 

## 2019-06-15 NOTE — ED Triage Notes (Signed)
C/O starting with hematuria yesterday.  Also c/o some polyuria and describes dysuria.  States felt feverish yesterday.

## 2019-06-15 NOTE — Discharge Instructions (Signed)
Begin Keflex twice daily for 1 week to treat UTI Continue to drink plenty of fluids Monitor for resolution of symptoms Follow up if blood in urine persisting- may need to see urology

## 2019-06-16 NOTE — ED Provider Notes (Signed)
[place]    CSN: [id number] Arrival date & time: 06/15/19  1419      History   Chief Complaint Chief Complaint  Patient presents with  . Hematuria    HPI Natalie Thornton is a 34 y.o. female history of tobacco use, presenting today for evaluation of hematuria.  Patient states that yesterday she began to noticed visible blood in her urine.  She has also noticed that she has had increased urinary frequency, polyuria.  She has a slight weird sensation after completing urination, denies dysuria/burning with urination.  She denies any vaginal symptoms of abnormal discharge, itching or irritation or vaginal burning.  She has been trying to use water and cranberry juice.  Last menstrual cycle was approximately 2 weeks ago.  Denies history of UTIs.  Denies history of kidney stones.  Denies any abdominal pain or back pain.   HPI  Past Medical History:  Diagnosis Date  . Anxiety   . Arthritis   . DDD (degenerative disc disease)   . Degenerative disc disease, cervical   . Disc disease, degenerative, cervical   . H pylori ulcer   . H/O degenerative disc disease 2013  . History of hiatal hernia   . Pilonidal cyst     Patient Active Problem List   Diagnosis Date Noted  . S/P excision of lipoma 09/12/2018  . Lipoma of back   . Chronic bilateral thoracic back pain 07/17/2018  . DDD (degenerative disc disease), lumbar 06/14/2018  . Diabetes mellitus screening 01/06/2017  . Tobacco abuse 01/06/2017  . Anxiety and depression 01/06/2017  . Dyspnea on exertion 10/20/2016  . Cigarette smoker 10/20/2016  . Solitary pulmonary nodule 10/20/2016  . Acute contact dermatitis 10/20/2016  . Pilonidal sinus 07/05/2011    Past Surgical History:  Procedure Laterality Date  . COLONOSCOPY    . LIPOMA EXCISION Right 08/10/2018   Procedure: EXCISION LIPOMA, RIGHT;  Surgeon: Fredirick Maudlin, MD;  Location: [facility] ORS;  Service: General;  Laterality: Right;    OB History   No obstetric  history on file.      Home Medications    Prior to Admission medications   Medication Sig Start Date End Date Taking? Authorizing Provider  hydrOXYzine (ATARAX/VISTARIL) 25 MG tablet TAKE 1 TABLET (25 MG TOTAL) BY MOUTH EVERY 8 (EIGHT) HOURS AS NEEDED FOR ANXIETY. 03/28/19  Yes Ladell Pier, MD  omeprazole (PRILOSEC) 20 MG capsule Take 1 capsule (20 mg total) by mouth 2 (two) times daily before a meal. 06/09/19  Yes Jaynee Eagles, PA-C  traMADol (ULTRAM) 50 MG tablet Take 1 tablet (50 mg total) by mouth every 6 (six) hours as needed. 06/09/19  Yes Jaynee Eagles, PA-C  cephALEXin (KEFLEX) 500 MG capsule Take 1 capsule (500 mg total) by mouth 2 (two) times daily for 7 days. 06/15/19 06/22/19  Ran Tullis C, PA-C  albuterol (VENTOLIN HFA) 108 (90 Base) MCG/ACT inhaler Inhale 2 puffs into the lungs every 2 (two) hours as needed for wheezing or shortness of breath (cough). Patient not taking: Reported on 06/13/2019 03/08/19 06/15/19  Muthersbaugh, Jarrett Soho, PA-C  sucralfate (CARAFATE) 1 GM/10ML suspension Take 10 mLs (1 g total) by mouth 4 (four) times daily -  with meals and at bedtime. Patient not taking: Reported on 06/13/2019 06/09/19 06/15/19  Jaynee Eagles, PA-C    Family History Family History  Problem Relation Age of Onset  . Healthy Mother   . Healthy Father   . AAA (abdominal aortic aneurysm) Maternal Grandmother   .  Leukemia Maternal Grandmother   . Breast cancer Maternal Grandmother   . Head & neck cancer Paternal Grandmother        Back cancer  . Lymphoma Paternal Grandmother   . Cirrhosis Maternal Uncle   . Breast cancer Other   . Colon cancer Neg Hx   . Esophageal cancer Neg Hx   . Rectal cancer Neg Hx   . Stomach cancer Neg Hx     Social History Social History   Tobacco Use  . Smoking status: Former Smoker    Packs/day: 5.00    Years: 10.00    Pack years: 50.00    Types: Cigars  . Smokeless tobacco: Never Used  . Tobacco comment: BLACK AND MILDS  Substance Use Topics  .  Alcohol use: Yes    Comment: occasionally  . Drug use: No     Allergies   Patient has no known allergies.   Review of Systems Review of Systems  Constitutional: Negative for fever.  Respiratory: Negative for shortness of breath.   Cardiovascular: Negative for chest pain.  Gastrointestinal: Negative for abdominal pain, diarrhea, nausea and vomiting.  Endocrine: Positive for polyuria.  Genitourinary: Positive for dysuria and frequency. Negative for flank pain, genital sores, hematuria, menstrual problem, vaginal bleeding, vaginal discharge and vaginal pain.  Musculoskeletal: Negative for back pain.  Skin: Negative for rash.  Neurological: Negative for dizziness, light-headedness and headaches.     Physical Exam Triage Vital Signs ED Triage Vitals  Enc Vitals Group     BP 06/15/19 1556 129/70     Pulse Rate 06/15/19 1556 70     Resp 06/15/19 1556 16     Temp 06/15/19 1556 98.8 F (37.1 C)     Temp Source 06/15/19 1556 Oral     SpO2 06/15/19 1556 100 %     Weight --      Height --      Head Circumference --      Peak Flow --      Pain Score 06/15/19 1557 0     Pain Loc --      Pain Edu? --      Excl. in [place]? --    No data found.  Updated Vital Signs BP 129/70   Pulse 70   Temp 98.8 F (37.1 C) (Oral)   Resp 16   LMP 05/22/2019 (Approximate)   SpO2 100%   Visual Acuity Right Eye Distance:   Left Eye Distance:   Bilateral Distance:    Right Eye Near:   Left Eye Near:    Bilateral Near:     Physical Exam Vitals and nursing note reviewed.  Constitutional:      Appearance: She is well-developed.     Comments: No acute distress  HENT:     Head: Normocephalic and atraumatic.     Nose: Nose normal.  Eyes:     Conjunctiva/sclera: Conjunctivae normal.  Cardiovascular:     Rate and Rhythm: Normal rate.  Pulmonary:     Effort: Pulmonary effort is normal. No respiratory distress.     Comments: Breathing comfortably at rest, CTABL, no wheezing, rales or  other adventitious sounds auscultated Abdominal:     General: There is no distension.     Comments: Soft, nondistended, nontender to light and deep palpation throughout abdomen  Musculoskeletal:        General: Normal range of motion.     Cervical back: Neck supple.  Skin:    General: Skin is warm and  dry.  Neurological:     Mental Status: She is alert and oriented to person, place, and time.      UC Treatments / Results  Labs (all labs ordered are listed, but only abnormal results are displayed) Labs Reviewed  POCT URINALYSIS DIP (DEVICE) - Abnormal; Notable for the following components:      Result Value   Hgb urine dipstick LARGE (*)    Protein, ur 30 (*)    Leukocytes,Ua TRACE (*)    All other components within normal limits  URINE CULTURE  GLUCOSE, CAPILLARY  POC URINE PREG, ED  POCT PREGNANCY, URINE  CBG MONITORING, ED  CBG MONITORING, ED    EKG   Radiology No results found.  Procedures Procedures (including critical care time)  Medications Ordered in UC Medications - No data to display  Initial Impression / Assessment and Plan / UC Course  I have reviewed the triage vital signs and the nursing notes.  Pertinent labs & imaging results that were available during my care of the patient were reviewed by me and considered in my medical decision making (see chart for details).     Large hemoglobin, trace leuks, given clinical symptoms we will go ahead and empirically treat for UTI with Keflex.  Patient without any pain, feel kidney stone less likely, but will still consider to monitor symptoms over the next few days.  Urine culture pending.  Continue to monitor for resolution of symptoms and hematuria.  Consider urology if hematuria persisting.  Discussed strict return precautions. Patient verbalized understanding and is agreeable with plan.  Final Clinical Impressions(s) / UC Diagnoses   Final diagnoses:  Gross hematuria  Lower urinary tract infectious  disease     Discharge Instructions     Begin Keflex twice daily for 1 week to treat UTI Continue to drink plenty of fluids Monitor for resolution of symptoms Follow up if blood in urine persisting- may need to see urology   ED Prescriptions    Medication Sig Dispense Auth. Provider   cephALEXin (KEFLEX) 500 MG capsule Take 1 capsule (500 mg total) by mouth 2 (two) times daily for 7 days. 14 capsule Shervin Cypert, [place] C, PA-C     PDMP not reviewed this encounter.   Mckay Brandt, [place] C, PA-C 06/16/19 1002

## 2019-06-17 ENCOUNTER — Encounter: Payer: Self-pay | Admitting: Internal Medicine

## 2019-06-17 ENCOUNTER — Other Ambulatory Visit: Payer: Self-pay | Admitting: Internal Medicine

## 2019-06-17 ENCOUNTER — Telehealth (HOSPITAL_COMMUNITY): Payer: Self-pay

## 2019-06-17 LAB — URINE CULTURE: Culture: >=100000 — AB

## 2019-06-17 MED ORDER — CIPROFLOXACIN HCL 500 MG PO TABS: 500.0000 mg | ORAL_TABLET | Freq: Two times a day (BID) | ORAL | 0 refills | Status: DC

## 2019-06-19 ENCOUNTER — Encounter: Payer: Self-pay | Admitting: Internal Medicine

## 2019-06-24 ENCOUNTER — Encounter: Payer: Self-pay | Admitting: Internal Medicine

## 2019-06-24 DIAGNOSIS — R197 Diarrhea, unspecified: Secondary | ICD-10-CM

## 2019-06-24 DIAGNOSIS — R6883 Chills (without fever): Secondary | ICD-10-CM

## 2019-06-26 ENCOUNTER — Encounter: Payer: Self-pay | Admitting: Internal Medicine

## 2019-06-27 NOTE — Telephone Encounter (Signed)
Patient called wanting to get tested for the Flu and Pneumonia informed her she would need to go to Urgent to get tested.

## 2019-07-02 ENCOUNTER — Ambulatory Visit: Payer: BLUE CROSS/BLUE SHIELD | Attending: Internal Medicine

## 2019-07-02 DIAGNOSIS — Z20822 Contact with and (suspected) exposure to covid-19: Secondary | ICD-10-CM

## 2019-07-03 ENCOUNTER — Encounter: Payer: Self-pay | Admitting: Internal Medicine

## 2019-07-03 ENCOUNTER — Telehealth: Payer: Self-pay | Admitting: Internal Medicine

## 2019-07-03 LAB — NOVEL CORONAVIRUS, NAA: SARS-CoV-2, NAA: NOT DETECTED

## 2019-07-03 NOTE — Telephone Encounter (Signed)
Patient called and request for x-rays over her ribs. Patient stated that they have been hurting her for 4 days now. Please fu at your earliest convenience.

## 2019-07-03 NOTE — Telephone Encounter (Signed)
Pt sent a MyChart message today. Provider hasn't responded. Will check with pcp in the morning

## 2019-07-08 ENCOUNTER — Encounter: Payer: Self-pay | Admitting: Internal Medicine

## 2019-07-09 ENCOUNTER — Encounter: Payer: Self-pay | Admitting: Internal Medicine

## 2019-07-09 DIAGNOSIS — M5136 Other intervertebral disc degeneration, lumbar region: Secondary | ICD-10-CM

## 2019-07-09 DIAGNOSIS — G8929 Other chronic pain: Secondary | ICD-10-CM

## 2019-07-10 ENCOUNTER — Encounter: Payer: Self-pay | Admitting: Physician Assistant

## 2019-07-10 ENCOUNTER — Ambulatory Visit (INDEPENDENT_AMBULATORY_CARE_PROVIDER_SITE_OTHER): Payer: 59 | Admitting: Internal Medicine

## 2019-07-10 ENCOUNTER — Encounter: Payer: Self-pay | Admitting: Internal Medicine

## 2019-07-10 VITALS — BP 100/60 | HR 68 | Temp 97.1°F | Ht 59.0 in | Wt 144.0 lb

## 2019-07-10 DIAGNOSIS — R109 Unspecified abdominal pain: Secondary | ICD-10-CM

## 2019-07-10 DIAGNOSIS — M5136 Other intervertebral disc degeneration, lumbar region: Secondary | ICD-10-CM

## 2019-07-10 DIAGNOSIS — Z01818 Encounter for other preprocedural examination: Secondary | ICD-10-CM

## 2019-07-10 DIAGNOSIS — R1013 Epigastric pain: Secondary | ICD-10-CM | POA: Diagnosis not present

## 2019-07-10 DIAGNOSIS — M51369 Other intervertebral disc degeneration, lumbar region without mention of lumbar back pain or lower extremity pain: Secondary | ICD-10-CM

## 2019-07-10 DIAGNOSIS — G8929 Other chronic pain: Secondary | ICD-10-CM

## 2019-07-10 MED ORDER — OMEPRAZOLE 40 MG PO CPDR: 40.0000 mg | DELAYED_RELEASE_CAPSULE | Freq: Every day | ORAL | 1 refills | Status: DC

## 2019-07-10 NOTE — Patient Instructions (Signed)
If you are age 32 or older, your body mass index should be between 23-30. Your Body mass index is 29.08 kg/m. If this is out of the aforementioned range listed, please consider follow up with your Primary Care Provider.  If you are age 33 or younger, your body mass index should be between 19-25. Your Body mass index is 29.08 kg/m. If this is out of the aformentioned range listed, please consider follow up with your Primary Care Provider.     You have been scheduled for an endoscopy. Please follow written instructions given to you at your visit today. If you use inhalers (even only as needed), please bring them with you on the day of your procedure.

## 2019-07-11 ENCOUNTER — Encounter: Payer: Self-pay | Admitting: Internal Medicine

## 2019-07-11 NOTE — Progress Notes (Signed)
HISTORY OF PRESENT ILLNESS:  Natalie Thornton is a 34 y.o. female with anxiety, degenerative disc disease, arthritis, and chronic functional abdominal pain for which she is undergone extensive previous work-up (colonoscopy, upper endoscopy, CT scan, MRI, gastric emptying scan, blood work) without organic cause ascertained.  She was previously treated for Helicobacter pylori.  Successful eradication was proven with endoscopic biopsy.  She is sent today by her primary care provider Dr. Wynetta Emery regarding recurrent problems with abdominal pain.  Concerns over possible ulcer disease.  Patient tells me that she began to have worsening problems with abdominal pain around Thanksgiving.  She cannot identify factors that either exacerbate or relieve the pain reliably, such as meals, defecation, or activity.  She does report some nausea but no vomiting.  She is gained approximately 10 pounds over the past year.  Previous colonoscopy March 2018 and upper endoscopy November 2019 were normal.  Patient tells me that she has been taking NSAIDs.  Review of blood work from November 2020 finds normal CBC with hemoglobin 14.0.  Review of outside office notes suggested that the patient would be referred to the pain clinic regarding her chronic back pain issues  REVIEW OF SYSTEMS:  All non-GI ROS negative unless otherwise stated in the HPI except for anxiety, arthritis, back pain, sleeping problems, muscle cramps, shortness of breath  Past Medical History:  Diagnosis Date  . Anxiety   . Arthritis   . DDD (degenerative disc disease)   . Degenerative disc disease, cervical   . Disc disease, degenerative, cervical   . H pylori ulcer   . H/O degenerative disc disease 2013  . History of hiatal hernia   . Pilonidal cyst     Past Surgical History:  Procedure Laterality Date  . COLONOSCOPY    . LIPOMA EXCISION Right 08/10/2018   Procedure: EXCISION LIPOMA, RIGHT;  Surgeon: Fredirick Maudlin, MD;  Location: [facility] ORS;   Service: General;  Laterality: Right;    Social History [name]  reports that she has quit smoking. Her smoking use included cigars. She has a 50.00 pack-year smoking history. She has never used smokeless tobacco. She reports current alcohol use. She reports that she does not use drugs.  family history includes AAA (abdominal aortic aneurysm) in her maternal grandmother; Breast cancer in her maternal grandmother and another family member; Cirrhosis in her maternal uncle; Head & neck cancer in her paternal grandmother; Healthy in her father and mother; Leukemia in her maternal grandmother; Lymphoma in her paternal grandmother.  No Known Allergies     PHYSICAL EXAMINATION: Vital signs: BP 100/60   Pulse 68   Temp (!) 97.1 F (36.2 C)   Ht 4\' 11"  (1.499 m)   Wt 144 lb (65.3 kg)   BMI 29.08 kg/m   Constitutional: generally well-appearing, no acute distress Psychiatric: alert and oriented x3, cooperative Eyes: extraocular movements intact, anicteric, conjunctiva pink Mouth: oral pharynx moist, no lesions Neck: supple no lymphadenopathy Cardiovascular: heart regular rate and rhythm, no murmur Lungs: clear to auscultation bilaterally Abdomen: soft, tenderness over the upper abdominal wall with minimal palpation, nondistended, no obvious ascites, no peritoneal signs, normal bowel sounds, no organomegaly Rectal: Omitted Extremities: no clubbing, cyanosis, or edema lower extremity edema bilaterally Skin: no lesions on visible extremities Neuro: No focal deficits.  Cranial nerves intact  ASSESSMENT:  1.  Epigastric pain.  Suspect musculoskeletal versus functional.  Rule out ulcer disease with history of NSAID use and prior history of Helicobacter pylori (treated successfully).  Normal  upper endoscopy 2019 2.  History of chronic functional abdominal pain with negative extensive work-up 3.  General medical problems 4.  Normal colonoscopy 2018  PLAN:  1.  Upper endoscopy. The  nature of the procedure, as well as the risks, benefits, and alternatives were carefully and thoroughly reviewed with the patient. Ample time for discussion and questions allowed. The patient understood, was satisfied, and agreed to proceed. 2.  If endoscopy fails to ascertain the cause for abdominal pain then the differential diagnosis remains functional versus muscle wall pain.  Would agree with the current plan for pain clinic referral Total time of 30 minutes was required preparing to see the patient, reviewing test, obtaining history, reviewing outside history, performing comprehensive physical exam, counseling the patient regarding her chronic abdominal pain, ordering upper endoscopy to be performed, and documenting the clinical information in the health record.

## 2019-07-12 ENCOUNTER — Ambulatory Visit (INDEPENDENT_AMBULATORY_CARE_PROVIDER_SITE_OTHER): Payer: 59

## 2019-07-12 DIAGNOSIS — Z1159 Encounter for screening for other viral diseases: Secondary | ICD-10-CM

## 2019-07-13 LAB — SARS CORONAVIRUS 2 (TAT 6-24 HRS): SARS Coronavirus 2: NEGATIVE

## 2019-07-15 ENCOUNTER — Telehealth: Payer: Self-pay | Admitting: Internal Medicine

## 2019-07-15 NOTE — Telephone Encounter (Signed)
-----   Message from Ena Dawley sent at 07/15/2019  3:56 PM EST ----- I contacted  Mrs Milnor  and she is going to contact her insurance  because I don't have access to it and she will let me know so I can refer her to where her insurance cover .   ----- Message ----- From: Ladell Pier, MD Sent: 07/13/2019   7:08 PM EST To: Ena Dawley  I recently submitted a referral for her to see a neurosurgeon about her back.  She tells me that they do not accept her insurance.  Can we please refer her to our practice that accepts her insurance?  Thanks

## 2019-07-16 ENCOUNTER — Ambulatory Visit (AMBULATORY_SURGERY_CENTER): Payer: 59 | Admitting: Internal Medicine

## 2019-07-16 ENCOUNTER — Encounter: Payer: Self-pay | Admitting: Internal Medicine

## 2019-07-16 ENCOUNTER — Telehealth: Payer: Self-pay | Admitting: Internal Medicine

## 2019-07-16 ENCOUNTER — Other Ambulatory Visit: Payer: Self-pay

## 2019-07-16 VITALS — BP 122/69 | HR 66 | Temp 97.3°F | Resp 16 | Ht 59.0 in | Wt 144.0 lb

## 2019-07-16 DIAGNOSIS — R1084 Generalized abdominal pain: Secondary | ICD-10-CM | POA: Diagnosis present

## 2019-07-16 MED ORDER — SODIUM CHLORIDE 0.9 % IV SOLN: 500.0000 mL | Freq: Once | INTRAVENOUS | Status: DC

## 2019-07-16 NOTE — Progress Notes (Signed)
PT taken to PACU. Monitors in place. VSS. Report given to RN. 

## 2019-07-16 NOTE — Progress Notes (Signed)
Pt's states no medical or surgical changes since previsit or office visit. 

## 2019-07-16 NOTE — Patient Instructions (Signed)
YOU HAD AN ENDOSCOPIC PROCEDURE TODAY AT THE [facility]:   Refer to the procedure report that was given to you for any specific questions about what was found during the examination.  If the procedure report does not answer your questions, please call your gastroenterologist to clarify.  If you requested that your care partner not be given the details of your procedure findings, then the procedure report has been included in a sealed envelope for you to review at your convenience later.  YOU SHOULD EXPECT: Some feelings of bloating in the abdomen. Passage of more gas than usual.  Walking can help get rid of the air that was put into your GI tract during the procedure and reduce the bloating. If you had a lower endoscopy (such as a colonoscopy or flexible sigmoidoscopy) you may notice spotting of blood in your stool or on the toilet paper. If you underwent a bowel prep for your procedure, you may not have a normal bowel movement for a few days.  Please Note:  You might notice some irritation and congestion in your nose or some drainage.  This is from the oxygen used during your procedure.  There is no need for concern and it should clear up in a day or so.  SYMPTOMS TO REPORT IMMEDIATELY:     Following upper endoscopy (EGD)  Vomiting of blood or coffee ground material  New chest pain or pain under the shoulder blades  Painful or persistently difficult swallowing  New shortness of breath  Fever of 100F or higher  Black, tarry-looking stools  For urgent or emergent issues, a gastroenterologist can be reached at any hour by calling [phone].   DIET:  We do recommend a small meal at first, but then you may proceed to your regular diet.  Drink plenty of fluids but you should avoid alcoholic beverages for 24 hours.  ACTIVITY:  You should plan to take it easy for the rest of today and you should NOT DRIVE or use heavy machinery until tomorrow (because of the sedation medicines  used during the test).    FOLLOW UP: Our staff will call the number listed on your records 48-72 hours following your procedure to check on you and address any questions or concerns that you may have regarding the information given to you following your procedure. If we do not reach you, we will leave a message.  We will attempt to reach you two times.  During this call, we will ask if you have developed any symptoms of COVID 19. If you develop any symptoms (ie: fever, flu-like symptoms, shortness of breath, cough etc.) before then, please call [phone].  If you test positive for Covid 19 in the 2 weeks post procedure, please call and report this information to us.    If any biopsies were taken you will be contacted by phone or by letter within the next 1-3 weeks.  Please call us at [phone] if you have not heard about the biopsies in 3 weeks.    SIGNATURES/CONFIDENTIALITY: You and/or your care partner have signed paperwork which will be entered into your electronic medical record.  These signatures attest to the fact that that the information above on your After Visit Summary has been reviewed and is understood.  Full responsibility of the confidentiality of this discharge information lies with you and/or your care-partner.   Resume medications. 

## 2019-07-16 NOTE — Telephone Encounter (Signed)
Patient called to let the referral coordinator know that Dr. Dahlia Bailiff at [facility]. [address] takes her insurance for her spine surgery. (Please follow up)

## 2019-07-16 NOTE — Op Note (Signed)
[facility] Patient Name: Natalie Thornton Procedure Date: 07/16/2019 10:04 AM MRN: [id number] Endoscopist: Docia Chuck. Henrene Pastor , MD Age: 34 Referring MD:  Date of Birth: Nov 29, 1985 Gender: Female Account #: [id number] Procedure:                Upper GI endoscopy Indications:              Abdominal pain Medicines:                Monitored Anesthesia Care Procedure:                Pre-Anesthesia Assessment:                           - Prior to the procedure, a History and Physical                            was performed, and patient medications and                            allergies were reviewed. The patient's tolerance of                            previous anesthesia was also reviewed. The risks                            and benefits of the procedure and the sedation                            options and risks were discussed with the patient.                            All questions were answered, and informed consent                            was obtained. Prior Anticoagulants: The patient has                            taken no previous anticoagulant or antiplatelet                            agents. ASA Grade Assessment: II - A patient with                            mild systemic disease. After reviewing the risks                            and benefits, the patient was deemed in                            satisfactory condition to undergo the procedure.                           After obtaining informed consent, the endoscope was  passed under direct vision. Throughout the                            procedure, the patient's blood pressure, pulse, and                            oxygen saturations were monitored continuously. The                            Endoscope was introduced through the mouth, and                            advanced to the second part of duodenum. The upper                            GI endoscopy was accomplished without  difficulty.                            The patient tolerated the procedure well. Scope In: Scope Out: Findings:                 The esophagus was normal.                           The stomach was normal.                           The examined duodenum was normal.                           The cardia and gastric fundus were normal on                            retroflexion. Complications:            No immediate complications. Estimated Blood Loss:     Estimated blood loss: none. Impression:               1. Normal EGD                           2. No GI cause for pain found or suspected Recommendation:           - Patient has a contact number available for                            emergencies. The signs and symptoms of potential                            delayed complications were discussed with the                            patient. Return to normal activities tomorrow.                            Written discharge instructions were provided to the  patient.                           - Resume previous diet.                           - Continue present medications.                           - Return to the care of Dr. Bea Graff. Henrene Pastor, MD 07/16/2019 10:15:45 AM This report has been signed electronically.

## 2019-07-17 NOTE — Telephone Encounter (Signed)
Noted. Thank You.

## 2019-07-18 ENCOUNTER — Telehealth: Payer: Self-pay

## 2019-07-18 ENCOUNTER — Telehealth: Payer: Self-pay | Admitting: *Deleted

## 2019-07-18 NOTE — Telephone Encounter (Signed)
No answer for post procedure call back. Left message for patient to call with questions or concerns. 

## 2019-07-18 NOTE — Telephone Encounter (Signed)
Attempted to reach patient for pos-procedure f/u call. No answer. Left message.

## 2019-07-22 ENCOUNTER — Ambulatory Visit: Payer: 59 | Admitting: Family

## 2019-07-22 ENCOUNTER — Telehealth: Payer: Self-pay | Admitting: Internal Medicine

## 2019-07-22 NOTE — Telephone Encounter (Signed)
-----   Message from Ena Dawley sent at 07/22/2019 11:01 AM EST ----- Gm   Referral sent to  Dr. Dahlia Bailiff at [facility]. [address] takes her insurance for her spine surgery.  ----- Message ----- From: Ladell Pier, MD Sent: 07/13/2019   7:08 PM EST To: Ena Dawley  I recently submitted a referral for her to see a neurosurgeon about her back.  She tells me that they do not accept her insurance.  Can we please refer her to our practice that accepts her insurance?  Thanks

## 2019-07-26 ENCOUNTER — Encounter: Payer: Self-pay | Admitting: Internal Medicine

## 2019-07-30 DIAGNOSIS — M5136 Other intervertebral disc degeneration, lumbar region: Secondary | ICD-10-CM

## 2019-07-30 DIAGNOSIS — M51369 Other intervertebral disc degeneration, lumbar region without mention of lumbar back pain or lower extremity pain: Secondary | ICD-10-CM

## 2019-07-30 HISTORY — DX: Other intervertebral disc degeneration, lumbar region without mention of lumbar back pain or lower extremity pain: M51.369

## 2019-07-30 HISTORY — DX: Other intervertebral disc degeneration, lumbar region: M51.36

## 2019-07-31 ENCOUNTER — Encounter: Payer: Self-pay | Admitting: Internal Medicine

## 2019-07-31 NOTE — Progress Notes (Signed)
Received note from Dr. Rolena Infante at [facility].  Pt seen 07/30/2019. Pt reported to him that she had ESI to lower back in Nov and Dec 2020.  Also reported having had MRI in Nov done by pain specialist in [facility].  Pt did not carry copy of that report or films with her.  I did not receive that info either. Dr Rolena Infante told her is is unable to do anything for her until he receives more info.  He did plain x-ray of her lower back there in the office and that revealed moderate DDD at L5-S1 without any other structural abnormality.

## 2019-08-02 ENCOUNTER — Encounter: Payer: Self-pay | Admitting: Internal Medicine

## 2019-08-02 ENCOUNTER — Ambulatory Visit: Payer: 59 | Attending: Family | Admitting: Internal Medicine

## 2019-08-02 ENCOUNTER — Other Ambulatory Visit: Payer: Self-pay

## 2019-08-02 VITALS — BP 117/77 | HR 89 | Temp 97.9°F | Resp 16 | Wt 148.0 lb

## 2019-08-02 DIAGNOSIS — E663 Overweight: Secondary | ICD-10-CM

## 2019-08-02 DIAGNOSIS — R109 Unspecified abdominal pain: Secondary | ICD-10-CM

## 2019-08-02 DIAGNOSIS — H6121 Impacted cerumen, right ear: Secondary | ICD-10-CM

## 2019-08-02 DIAGNOSIS — H9202 Otalgia, left ear: Secondary | ICD-10-CM

## 2019-08-02 NOTE — Progress Notes (Signed)
Patient ID: Natalie Thornton, female    DOB: Jan 19, 1986  MRN: [id number]  CC: Otitis Media   Subjective: Natalie Thornton is a 34 y.o. female who presents for chronic ds management. Her concerns today include:  Pt with hx of anxiety/depression, LBP with DDD in lumbar and thoracic spine, tob dep,functionaldyspepsia, hx of pos H.pylori in past  Chronic abdominal pain: Had EGD done.  It was normal.  She stopped taking omeprazole. Today she reports that she is doing much better.  She still gets pain every once in a while. She has cut back on the amount of alcoholic beverages that she drinks.  She drinks 2-4 shots about once a week or if she is going out rather than every day.  Concern about wgh gain Works at [employer] 7 days a wk. Constantly on her feet.  Not doing much exercise outside of work.  She feels that she is not eating that much.  C/o pain and itching in LT ear x 2 days.  She put some peroxide on a cottonball and clean the ear.  Normally uses a Q-tip to clean the ear.  No drainage from the ear.   Patient Active Problem List   Diagnosis Date Noted  . S/P excision of lipoma 09/12/2018  . Lipoma of back   . Chronic bilateral thoracic back pain 07/17/2018  . DDD (degenerative disc disease), lumbar 06/14/2018  . Diabetes mellitus screening 01/06/2017  . Tobacco abuse 01/06/2017  . Anxiety and depression 01/06/2017  . Dyspnea on exertion 10/20/2016  . Cigarette smoker 10/20/2016  . Solitary pulmonary nodule 10/20/2016  . Acute contact dermatitis 10/20/2016  . Pilonidal sinus 07/05/2011     Current Outpatient Medications on File Prior to Visit  Medication Sig Dispense Refill  . hydrOXYzine (ATARAX/VISTARIL) 25 MG tablet TAKE 1 TABLET (25 MG TOTAL) BY MOUTH EVERY 8 (EIGHT) HOURS AS NEEDED FOR ANXIETY. (Patient not taking: Reported on 08/02/2019) 30 tablet 1  . omeprazole (PRILOSEC) 40 MG capsule Take 1 capsule (40 mg total) by mouth daily. (Patient not taking: Reported on  08/02/2019) 30 capsule 1  . [DISCONTINUED] albuterol (VENTOLIN HFA) 108 (90 Base) MCG/ACT inhaler Inhale 2 puffs into the lungs every 2 (two) hours as needed for wheezing or shortness of breath (cough). (Patient not taking: Reported on 06/13/2019) 8 g 0  . [DISCONTINUED] sucralfate (CARAFATE) 1 GM/10ML suspension Take 10 mLs (1 g total) by mouth 4 (four) times daily -  with meals and at bedtime. (Patient not taking: Reported on 06/13/2019) 500 mL 0   Current Facility-Administered Medications on File Prior to Visit  Medication Dose Route Frequency Provider Last Rate Last Admin  . 0.9 %  sodium chloride infusion  500 mL Intravenous Once Irene Shipper, MD        No Known Allergies  Social History   Socioeconomic History  . Marital status: Single    Spouse name: Not on file  . Number of children: 2  . Years of education: Not on file  . Highest education level: Not on file  Occupational History  . Occupation: [employer]   Tobacco Use  . Smoking status: Former Smoker    Packs/day: 5.00    Years: 10.00    Pack years: 50.00    Types: Cigars  . Smokeless tobacco: Never Used  . Tobacco comment: [employer]  Substance and Sexual Activity  . Alcohol use: Yes    Comment: occasionally  . Drug use: No  . Sexual activity:  Not on file    Comment: oral intercourse only  Other Topics Concern  . Not on file  Social History Narrative  . Not on file   Social Determinants of Health   Financial Resource Strain:   . Difficulty of Paying Living Expenses: Not on file  Food Insecurity:   . Worried About Charity fundraiser in the Last Year: Not on file  . Ran Out of Food in the Last Year: Not on file  Transportation Needs:   . Lack of Transportation (Medical): Not on file  . Lack of Transportation (Non-Medical): Not on file  Physical Activity:   . Days of Exercise per Week: Not on file  . Minutes of Exercise per Session: Not on file  Stress:   . Feeling of Stress : Not on file  Social  Connections:   . Frequency of Communication with Friends and Family: Not on file  . Frequency of Social Gatherings with Friends and Family: Not on file  . Attends Religious Services: Not on file  . Active Member of Clubs or Organizations: Not on file  . Attends Archivist Meetings: Not on file  . Marital Status: Not on file  Intimate Partner Violence:   . Fear of Current or Ex-Partner: Not on file  . Emotionally Abused: Not on file  . Physically Abused: Not on file  . Sexually Abused: Not on file    Family History  Problem Relation Age of Onset  . Healthy Mother   . Healthy Father   . AAA (abdominal aortic aneurysm) Maternal Grandmother   . Leukemia Maternal Grandmother   . Breast cancer Maternal Grandmother   . Head & neck cancer Paternal Grandmother        Back cancer  . Lymphoma Paternal Grandmother   . Cirrhosis Maternal Uncle   . Breast cancer Other   . Colon cancer Neg Hx   . Esophageal cancer Neg Hx   . Rectal cancer Neg Hx   . Stomach cancer Neg Hx     Past Surgical History:  Procedure Laterality Date  . COLONOSCOPY    . LIPOMA EXCISION Right 08/10/2018   Procedure: EXCISION LIPOMA, RIGHT;  Surgeon: Fredirick Maudlin, MD;  Location: [facility] ORS;  Service: General;  Laterality: Right;    ROS: Review of Systems Negative except as stated above  PHYSICAL EXAM: BP 117/77   Pulse 89   Temp 97.9 F (36.6 C)   Resp 16   Wt 148 lb (67.1 kg)   SpO2 97%   BMI 29.89 kg/m   Wt Readings from Last 3 Encounters:  08/02/19 148 lb (67.1 kg)  07/16/19 144 lb (65.3 kg)  07/10/19 144 lb (65.3 kg)    Physical Exam  General appearance - alert, well appearing, and in no distress Mental status - normal mood, behavior, speech, dress, motor activity, and thought processes Ears -she has had wax buildup in the right ear obscuring most of the canal and tympanic membrane.  Left ear: She has some dry flaking skin at the entrance to the canal but otherwise the canal is clear  without erythema.  Tympanic membrane appears normal  CMP Latest Ref Rng & Units 05/13/2018 03/27/2018 03/24/2018  Glucose 70 - 99 mg/dL 85 103(H) 100(H)  BUN 6 - 20 mg/dL 6 6 <5(L)  Creatinine 0.44 - 1.00 mg/dL 0.78 0.90 0.93  Sodium 135 - 145 mmol/L 133(L) 137 138  Potassium 3.5 - 5.1 mmol/L 3.5 3.3(L) 3.6  Chloride 98 - 111  mmol/L 106 102 107  CO2 22 - 32 mmol/L 23 23 25   Calcium 8.9 - 10.3 mg/dL 8.7(L) 9.3 9.5  Total Protein 6.5 - 8.1 g/dL - - 7.0  Total Bilirubin 0.3 - 1.2 mg/dL - - 0.4  Alkaline Phos 38 - 126 U/L - - 37(L)  AST 15 - 41 U/L - - 23  ALT 0 - 44 U/L - - 15   Lipid Panel  No results found for: CHOL, TRIG, HDL, CHOLHDL, VLDL, LDLCALC, LDLDIRECT  CBC    Component Value Date/Time   WBC 4.6 05/01/2019 0917   WBC 4.7 05/13/2018 1640   RBC 4.61 05/01/2019 0917   RBC 4.18 05/13/2018 1640   HGB 14.0 05/01/2019 0917   HCT 41.8 05/01/2019 0917   PLT 255 05/01/2019 0917   MCV 91 05/01/2019 0917   MCH 30.4 05/01/2019 0917   MCH 29.4 05/13/2018 1640   MCHC 33.5 05/01/2019 0917   MCHC 31.9 05/13/2018 1640   RDW 11.8 05/01/2019 0917   LYMPHSABS 1.8 05/01/2019 0917   MONOABS 0.4 05/13/2018 1640   EOSABS 0.1 05/01/2019 0917   BASOSABS 0.0 05/01/2019 0917    ASSESSMENT AND PLAN: 1. Abdominal pain, unspecified abdominal location Significantly improved.  Most recent EGD was normal.  I think she probably has a component of functional dyspepsia.  2. Over weight Dietary counseling given.  Advised to eliminate sugary drinks from the diet, cut back on portion sizes of white carbohydrates, incorporate fresh fruits and vegetables into the diet, and try to eat more white lean meat instead of beef or pork.  She declines referral to a nutritionist due to her work schedule. Also encouraged some form of moderate intensity exercise 3 to 4 days a week for 30 minutes.  3. Left ear pain Recommend using a little bit of hydrocortisone on a Q-tip and swirling it at the entrance of the  canal to help decrease the itching.  No infection noted at this time  4. Impacted cerumen of right ear Recommend using some over-the-counter wax softener    Patient was given the opportunity to ask questions.  Patient verbalized understanding of the plan and was able to repeat key elements of the plan.   No orders of the defined types were placed in this encounter.    Requested Prescriptions    No prescriptions requested or ordered in this encounter    No follow-ups on file.  Karle Plumber, MD, FACP

## 2019-08-02 NOTE — Patient Instructions (Signed)

## 2019-08-13 ENCOUNTER — Encounter: Payer: Self-pay | Admitting: Internal Medicine

## 2019-08-15 ENCOUNTER — Ambulatory Visit: Payer: 59 | Attending: Internal Medicine | Admitting: Internal Medicine

## 2019-08-15 ENCOUNTER — Other Ambulatory Visit: Payer: Self-pay

## 2019-08-15 ENCOUNTER — Encounter: Payer: Self-pay | Admitting: Internal Medicine

## 2019-08-15 VITALS — BP 125/84 | HR 74 | Temp 100.0°F | Resp 16 | Wt 142.4 lb

## 2019-08-15 DIAGNOSIS — K146 Glossodynia: Secondary | ICD-10-CM | POA: Diagnosis not present

## 2019-08-15 DIAGNOSIS — K148 Other diseases of tongue: Secondary | ICD-10-CM | POA: Diagnosis present

## 2019-08-15 DIAGNOSIS — R079 Chest pain, unspecified: Secondary | ICD-10-CM

## 2019-08-15 DIAGNOSIS — Z79899 Other long term (current) drug therapy: Secondary | ICD-10-CM | POA: Diagnosis not present

## 2019-08-15 DIAGNOSIS — F17290 Nicotine dependence, other tobacco product, uncomplicated: Secondary | ICD-10-CM | POA: Insufficient documentation

## 2019-08-15 DIAGNOSIS — F419 Anxiety disorder, unspecified: Secondary | ICD-10-CM | POA: Insufficient documentation

## 2019-08-15 DIAGNOSIS — F329 Major depressive disorder, single episode, unspecified: Secondary | ICD-10-CM | POA: Diagnosis not present

## 2019-08-15 DIAGNOSIS — G8929 Other chronic pain: Secondary | ICD-10-CM | POA: Diagnosis not present

## 2019-08-15 DIAGNOSIS — M5136 Other intervertebral disc degeneration, lumbar region: Secondary | ICD-10-CM | POA: Diagnosis not present

## 2019-08-15 DIAGNOSIS — M546 Pain in thoracic spine: Secondary | ICD-10-CM | POA: Diagnosis not present

## 2019-08-15 MED ORDER — DICLOFENAC SODIUM 1 % EX GEL: 2.0000 g | Freq: Four times a day (QID) | CUTANEOUS | 0 refills | Status: DC

## 2019-08-15 MED ORDER — LIDOCAINE VISCOUS HCL 2 % MT SOLN: 15.0000 mL | Freq: Four times a day (QID) | OROMUCOSAL | 0 refills | Status: DC | PRN

## 2019-08-15 NOTE — Progress Notes (Signed)
Patient ID: Natalie Thornton, female    DOB: 21-Apr-1986  MRN: [id number]  CC: No chief complaint on file.   Subjective: Natalie Thornton is a 34 y.o. female who presents for UC visit Her concerns today include:  Pt with hx of anxiety/depression, LBP with DDD in lumbar and thoracic spine, tob dep,functionaldyspepsia, hx of pos H.pylori in past  Pt c/o having reddish sore bumps RT side of tongue since 08/13/2019.  Sensitive to food initially but now okay. They are going away.  No previous episodes.  She complains of pain/soreness in the left upper anterior chest wall since yesterday.  It is sensitive to touch.  No initiating factors.  No shortness of breath. She has had some cramps close to the left axilla but states that that is not new.     Smokes 4 cigars a day.  Smoked for a few yrs.   Patient Active Problem List   Diagnosis Date Noted  . S/P excision of lipoma 09/12/2018  . Lipoma of back   . Chronic bilateral thoracic back pain 07/17/2018  . DDD (degenerative disc disease), lumbar 06/14/2018  . Diabetes mellitus screening 01/06/2017  . Tobacco abuse 01/06/2017  . Anxiety and depression 01/06/2017  . Dyspnea on exertion 10/20/2016  . Cigarette smoker 10/20/2016  . Solitary pulmonary nodule 10/20/2016  . Acute contact dermatitis 10/20/2016  . Pilonidal sinus 07/05/2011     Current Outpatient Medications on File Prior to Visit  Medication Sig Dispense Refill  . hydrOXYzine (ATARAX/VISTARIL) 25 MG tablet TAKE 1 TABLET (25 MG TOTAL) BY MOUTH EVERY 8 (EIGHT) HOURS AS NEEDED FOR ANXIETY. (Patient not taking: Reported on 08/02/2019) 30 tablet 1  . omeprazole (PRILOSEC) 40 MG capsule Take 1 capsule (40 mg total) by mouth daily. (Patient not taking: Reported on 08/02/2019) 30 capsule 1  . [DISCONTINUED] albuterol (VENTOLIN HFA) 108 (90 Base) MCG/ACT inhaler Inhale 2 puffs into the lungs every 2 (two) hours as needed for wheezing or shortness of breath (cough). (Patient not taking:  Reported on 06/13/2019) 8 g 0  . [DISCONTINUED] sucralfate (CARAFATE) 1 GM/10ML suspension Take 10 mLs (1 g total) by mouth 4 (four) times daily -  with meals and at bedtime. (Patient not taking: Reported on 06/13/2019) 500 mL 0   Current Facility-Administered Medications on File Prior to Visit  Medication Dose Route Frequency Provider Last Rate Last Admin  . 0.9 %  sodium chloride infusion  500 mL Intravenous Once Irene Shipper, MD        No Known Allergies  Social History   Socioeconomic History  . Marital status: Single    Spouse name: Not on file  . Number of children: 2  . Years of education: Not on file  . Highest education level: Not on file  Occupational History  . Occupation: [employer]   Tobacco Use  . Smoking status: Former Smoker    Packs/day: 5.00    Years: 10.00    Pack years: 50.00    Types: Cigars  . Smokeless tobacco: Never Used  . Tobacco comment: BLACK AND MILDS  Substance and Sexual Activity  . Alcohol use: Yes    Comment: occasionally  . Drug use: No  . Sexual activity: Not on file    Comment: oral intercourse only  Other Topics Concern  . Not on file  Social History Narrative  . Not on file   Social Determinants of Health   Financial Resource Strain:   . Difficulty of Paying Living Expenses:  Food Insecurity:   . Worried About Charity fundraiser in the Last Year:   . Arboriculturist in the Last Year:   Transportation Needs:   . Film/video editor (Medical):   Marland Kitchen Lack of Transportation (Non-Medical):   Physical Activity:   . Days of Exercise per Week:   . Minutes of Exercise per Session:   Stress:   . Feeling of Stress :   Social Connections:   . Frequency of Communication with Friends and Family:   . Frequency of Social Gatherings with Friends and Family:   . Attends Religious Services:   . Active Member of Clubs or Organizations:   . Attends Archivist Meetings:   Marland Kitchen Marital Status:   Intimate Partner Violence:   . Fear  of Current or Ex-Partner:   . Emotionally Abused:   Marland Kitchen Physically Abused:   . Sexually Abused:     Family History  Problem Relation Age of Onset  . Healthy Mother   . Healthy Father   . AAA (abdominal aortic aneurysm) Maternal Grandmother   . Leukemia Maternal Grandmother   . Breast cancer Maternal Grandmother   . Head & neck cancer Paternal Grandmother        Back cancer  . Lymphoma Paternal Grandmother   . Cirrhosis Maternal Uncle   . Breast cancer Other   . Colon cancer Neg Hx   . Esophageal cancer Neg Hx   . Rectal cancer Neg Hx   . Stomach cancer Neg Hx     Past Surgical History:  Procedure Laterality Date  . COLONOSCOPY    . LIPOMA EXCISION Right 08/10/2018   Procedure: EXCISION LIPOMA, RIGHT;  Surgeon: Fredirick Maudlin, MD;  Location: [facility] ORS;  Service: General;  Laterality: Right;    ROS: Review of Systems Negative except as stated above  PHYSICAL EXAM: BP 125/84   Pulse 74   Temp 100 F (37.8 C)   Resp 16   Wt 142 lb 6.4 oz (64.6 kg)   SpO2 98%   BMI 28.76 kg/m   Physical Exam  General appearance - alert, well appearing, and in no distress Mental status - normal mood, behavior, speech, dress, motor activity, and thought processes Mouth -slight irritation noted on the right side of the tongue posterior laterally.  No abnormal bumps seen.  No other oral lesions.  Throat is clear. Chest - clear to auscultation, no wheezes, rales or rhonchi, symmetric air entry Heart - normal rate, regular rhythm, normal S1, S2, no murmurs, rubs, clicks or gallops.  Mild reproducible tenderness on palpation of the left upper anterior chest wall. No cervical or axillary lymphadenopathy  CMP Latest Ref Rng & Units 05/13/2018 03/27/2018 03/24/2018  Glucose 70 - 99 mg/dL 85 103(H) 100(H)  BUN 6 - 20 mg/dL 6 6 <5(L)  Creatinine 0.44 - 1.00 mg/dL 0.78 0.90 0.93  Sodium 135 - 145 mmol/L 133(L) 137 138  Potassium 3.5 - 5.1 mmol/L 3.5 3.3(L) 3.6  Chloride 98 - 111 mmol/L 106 102  107  CO2 22 - 32 mmol/L 23 23 25   Calcium 8.9 - 10.3 mg/dL 8.7(L) 9.3 9.5  Total Protein 6.5 - 8.1 g/dL - - 7.0  Total Bilirubin 0.3 - 1.2 mg/dL - - 0.4  Alkaline Phos 38 - 126 U/L - - 37(L)  AST 15 - 41 U/L - - 23  ALT 0 - 44 U/L - - 15   Lipid Panel  No results found for: CHOL, TRIG, HDL, CHOLHDL,  VLDL, LDLCALC, LDLDIRECT  CBC    Component Value Date/Time   WBC 4.6 05/01/2019 0917   WBC 4.7 05/13/2018 1640   RBC 4.61 05/01/2019 0917   RBC 4.18 05/13/2018 1640   HGB 14.0 05/01/2019 0917   HCT 41.8 05/01/2019 0917   PLT 255 05/01/2019 0917   MCV 91 05/01/2019 0917   MCH 30.4 05/01/2019 0917   MCH 29.4 05/13/2018 1640   MCHC 33.5 05/01/2019 0917   MCHC 31.9 05/13/2018 1640   RDW 11.8 05/01/2019 0917   LYMPHSABS 1.8 05/01/2019 0917   MONOABS 0.4 05/13/2018 1640   EOSABS 0.1 05/01/2019 0917   BASOSABS 0.0 05/01/2019 0917   EKG revealed sinus rhythm with no acute changes.  Unchanged from previous EKG in the system. ASSESSMENT AND PLAN:  1. Soreness of tongue The area looks slightly irritated like she may have bitten the tongue.  In any event it is getting better.  I recommended using some viscous lidocaine as needed - lidocaine (XYLOCAINE) 2 % solution; Use as directed 15 mLs in the mouth or throat every 6 (six) hours as needed for mouth pain. Wish and spit.  Dispense: 100 mL; Refill: 0  2. Chest pain in adult Most consistent with costochondritis.  Instead of an oral NSAID, I will prescribe a topical NSAID in the form of Voltaren gel - EKG 12-Lead   Patient was given the opportunity to ask questions.  Patient verbalized understanding of the plan and was able to repeat key elements of the plan.   Orders Placed This Encounter  Procedures  . EKG 12-Lead     Requested Prescriptions   Signed Prescriptions Disp Refills  . lidocaine (XYLOCAINE) 2 % solution 100 mL 0    Sig: Use as directed 15 mLs in the mouth or throat every 6 (six) hours as needed for mouth pain. Wish  and spit.  Marland Kitchen diclofenac Sodium (VOLTAREN) 1 % GEL 100 g 0    Sig: Apply 2 g topically 4 (four) times daily.    No follow-ups on file.  Karle Plumber, MD, FACP

## 2019-08-16 ENCOUNTER — Ambulatory Visit: Payer: 59 | Admitting: Internal Medicine

## 2019-09-09 ENCOUNTER — Other Ambulatory Visit: Payer: Self-pay

## 2019-09-09 ENCOUNTER — Encounter: Payer: Self-pay | Admitting: Internal Medicine

## 2019-09-24 ENCOUNTER — Ambulatory Visit (INDEPENDENT_AMBULATORY_CARE_PROVIDER_SITE_OTHER): Payer: 59 | Admitting: Vascular Surgery

## 2019-09-24 ENCOUNTER — Encounter: Payer: Self-pay | Admitting: Vascular Surgery

## 2019-09-24 ENCOUNTER — Other Ambulatory Visit: Payer: Self-pay

## 2019-09-24 VITALS — BP 83/53 | HR 74 | Temp 97.3°F | Resp 20 | Ht 59.0 in | Wt 146.0 lb

## 2019-09-24 DIAGNOSIS — M5137 Other intervertebral disc degeneration, lumbosacral region: Secondary | ICD-10-CM | POA: Diagnosis not present

## 2019-09-24 NOTE — Progress Notes (Signed)
[facility]  Patient name: Natalie Thornton MRN: [id number] DOB: Aug 12, 1985 Sex: female  REASON FOR CONSULT: Discuss anterior exposure for L5-S1 degenerative disc disease  HPI: Natalie Thornton is a 34 y.o. female, who is here today for discussion of anterior exposure for L5-S1 degenerative disc disease.  She has seen Dr. Rolena Infante in consultation and is considering interbody fusion.  She reports pain in her back and also in her legs.  This is failed conservative treatment.  This is lifestyle limiting and she is being considered for lumbar fusion.  Past Medical History:  Diagnosis Date  . Anxiety   . Arthritis   . Chronic back pain   . DDD (degenerative disc disease)   . Degenerative disc disease, cervical   . Disc disease, degenerative, cervical   . H pylori ulcer   . H/O degenerative disc disease 2013  . History of hiatal hernia   . Low back pain   . Numbness    Whole back   . Pilonidal cyst     Family History  Problem Relation Age of Onset  . Healthy Mother   . Healthy Father   . AAA (abdominal aortic aneurysm) Maternal Grandmother   . Leukemia Maternal Grandmother   . Breast cancer Maternal Grandmother   . Head & neck cancer Paternal Grandmother        Back cancer  . Lymphoma Paternal Grandmother   . Cirrhosis Maternal Uncle   . Breast cancer Other   . Arthritis Paternal Grandfather   . Colon cancer Neg Hx   . Esophageal cancer Neg Hx   . Rectal cancer Neg Hx   . Stomach cancer Neg Hx     SOCIAL HISTORY: Social History   Socioeconomic History  . Marital status: Single    Spouse name: Not on file  . Number of children: 2  . Years of education: Not on file  . Highest education level: Not on file  Occupational History  . Occupation: [employer]   Tobacco Use  . Smoking status: Current Some Day Smoker    Years: 10.00    Types: Cigars  . Smokeless tobacco: Never Used  . Tobacco comment: BLACK  AND MILDS. 2 pks per week  Substance and Sexual Activity  . Alcohol use: Yes    Comment: occasionally  . Drug use: No  . Sexual activity: Not on file    Comment: oral intercourse only  Other Topics Concern  . Not on file  Social History Narrative  . Not on file   Social Determinants of Health   Financial Resource Strain:   . Difficulty of Paying Living Expenses:   Food Insecurity:   . Worried About Charity fundraiser in the Last Year:   . Arboriculturist in the Last Year:   Transportation Needs:   . Film/video editor (Medical):   Marland Kitchen Lack of Transportation (Non-Medical):   Physical Activity:   . Days of Exercise per Week:   . Minutes of Exercise per Session:   Stress:   . Feeling of Stress :   Social Connections:   . Frequency of Communication with Friends and Family:   . Frequency of Social Gatherings with Friends and Family:   . Attends Religious Services:   . Active Member of Clubs or Organizations:   . Attends Archivist Meetings:   Marland Kitchen Marital Status:   Intimate Partner Violence:   . Fear of Current or Ex-Partner:   .  Emotionally Abused:   Marland Kitchen Physically Abused:   . Sexually Abused:     No Known Allergies  Current Outpatient Medications  Medication Sig Dispense Refill  . diclofenac Sodium (VOLTAREN) 1 % GEL Apply 2 g topically 4 (four) times daily. 100 g 0   Current Facility-Administered Medications  Medication Dose Route Frequency Provider Last Rate Last Admin  . 0.9 %  sodium chloride infusion  500 mL Intravenous Once Irene Shipper, MD        REVIEW OF SYSTEMS:  [X]  denotes positive finding, [ ]  denotes negative finding Cardiac  Comments:  Chest pain or chest pressure:    Shortness of breath upon exertion:    Short of breath when lying flat:    Irregular heart rhythm:        Vascular    Pain in calf, thigh, or hip brought on by ambulation:    Pain in feet at night that wakes you up from your sleep:     Blood clot in your veins:    Leg  swelling:         Pulmonary    Oxygen at home:    Productive cough:     Wheezing:         Neurologic    Sudden weakness in arms or legs:     Sudden numbness in arms or legs:     Sudden onset of difficulty speaking or slurred speech:    Temporary loss of vision in one eye:     Problems with dizziness:         Gastrointestinal    Blood in stool:     Vomited blood:         Genitourinary    Burning when urinating:     Blood in urine:        Psychiatric    Major depression:         Hematologic    Bleeding problems:    Problems with blood clotting too easily:        Skin    Rashes or ulcers:        Constitutional    Fever or chills:      PHYSICAL EXAM: Vitals:   09/24/19 0912  BP: (!) 83/53  Pulse: 74  Resp: 20  Temp: (!) 97.3 F (36.3 C)  SpO2: 98%  Weight: 146 lb (66.2 kg)  Height: 4\' 11"  (1.499 m)    GENERAL: The patient is a well-nourished female, in no acute distress. The vital signs are documented above. CARDIOVASCULAR: 2+ radial 2+ femoral and 2+ dorsalis pedis pulses bilaterally.  Carotid arteries are without bruits bilaterally. PULMONARY: There is good air exchange  ABDOMEN: Soft and non-tender  MUSCULOSKELETAL: There are no major deformities or cyanosis. NEUROLOGIC: No focal weakness or paresthesias are detected. SKIN: There are no ulcers or rashes noted. PSYCHIATRIC: The patient has a normal affect.  DATA:  I do not have availability of any current imaging studies.  She did have CT of her abdomen from 2011 that I can review.  This shows normal bifurcation of her iliac vessels in relation to the 5 1 disc.  MEDICAL ISSUES: I had an extensive discussion with the patient regarding my role in mobilization.  Explained that Dr. Rolena Infante is feels that surgery is warranted for her spinal disease and that this is best approached from an anterior exposure.  Explained the low left transverse incision from the midline to the left and mobilization of the rectus  muscle.  Also explained retroperitoneal exposure mobilization of the left ureter and intra-abdominal contents.  Also discussed the mobilization of the arterial and venous structures overlying the spine with particular attention to possible venous injury.  Patient understands the proposed procedure.  Patient is not obese.  She has not had prior abdominal or pelvic surgery and has no evidence of atherosclerotic disease.  I do not see any contraindications to anterior exposure.   Rosetta Posner, MD FACS [facility] -  Tel [phone] Pager [phone]

## 2019-10-03 ENCOUNTER — Ambulatory Visit: Payer: 59 | Admitting: Internal Medicine

## 2019-10-15 ENCOUNTER — Ambulatory Visit: Payer: 59 | Admitting: Internal Medicine

## 2019-10-17 ENCOUNTER — Encounter: Payer: Self-pay | Admitting: Internal Medicine

## 2019-10-17 ENCOUNTER — Other Ambulatory Visit: Payer: Self-pay

## 2019-10-17 ENCOUNTER — Ambulatory Visit: Payer: 59 | Attending: Internal Medicine | Admitting: Internal Medicine

## 2019-10-17 VITALS — BP 119/79 | HR 76 | Temp 96.6°F | Resp 16 | Wt 145.6 lb

## 2019-10-17 DIAGNOSIS — Z5321 Procedure and treatment not carried out due to patient leaving prior to being seen by health care provider: Secondary | ICD-10-CM

## 2019-10-17 NOTE — Progress Notes (Signed)
Pt left without being seen. Pt had 8:50 appt.  When I went to exam room at 9:15, pt was not there.  Front desk called her on her phone.  Pt state she left because she has anxiety and can not sit in room for long.  Plans to reschedule for a first appt of the morning.

## 2019-10-18 ENCOUNTER — Ambulatory Visit: Payer: 59 | Admitting: Internal Medicine

## 2019-10-30 ENCOUNTER — Other Ambulatory Visit: Payer: Self-pay

## 2019-10-30 ENCOUNTER — Ambulatory Visit: Payer: Medicaid Other | Attending: Internal Medicine | Admitting: Physician Assistant

## 2019-10-30 DIAGNOSIS — R079 Chest pain, unspecified: Secondary | ICD-10-CM

## 2019-10-30 DIAGNOSIS — R0789 Other chest pain: Secondary | ICD-10-CM

## 2019-10-30 MED ORDER — OMEPRAZOLE 20 MG PO CPDR: 20.0000 mg | DELAYED_RELEASE_CAPSULE | Freq: Every day | ORAL | 3 refills | Status: DC

## 2019-10-30 MED FILL — OMEPRAZOLE 20 MG CAP: 20 | 30 days supply | Qty: 30 | Fill #0

## 2019-10-30 NOTE — Progress Notes (Signed)
Pt states the pain comes and goes   Pt states she knows she has anxiety  Pt states she gets this tightness in her chest and it bothers her

## 2019-10-30 NOTE — Progress Notes (Signed)
Virtual Visit via Telephone Note  I connected with Natalie Thornton on 10/30/19 at  2:30 PM EDT by telephone and verified that I am speaking with the correct person using two identifiers.   I discussed the limitations, risks, security and privacy concerns of performing an evaluation and management service by telephone and the availability of in person appointments. I also discussed with the patient that there may be a patient responsible charge related to this service. The patient expressed understanding and agreed to proceed.  PATIENT visit by telephone virtually in the context of Covid-19 pandemic. Patient location:  home My Location:  [place] office Persons on the call:  Me and the patient.    History of Present Illness:  Patient is c/o chest pressure for about 2 months that occurs several times a day.  No fever.  Middle of chest and goes bt both breast.  Feels better when she is sitting. Worse with laying down.  No new anxiety or stress.  episodes last about 10 minutes.  No palpitations.  No SOB.  She has had this previously.  No FH sudden death/early MI.  She works out and it doesn't occur at [employer]   Relieved by vicks vapor rub.  Ginger tea helps.      Observations/Objective:  NAD.  A&Ox3   Assessment and Plan: 1. Chest pressure Likely reflux.  Reviewed EKG 08/15/2019.  No ST changes - H. pylori breath test - Comprehensive metabolic panel - TSH - Vitamin D, 25-hydroxy - omeprazole (PRILOSEC) 20 MG capsule; Take 1 capsule (20 mg total) by mouth daily.  Dispense: 30 capsule; Refill: 3  2. Chest pain in adult - Comprehensive metabolic panel - TSH - Vitamin D, 25-hydroxy Call 911 if changes/becomes severe or accompanied by SOB.  Patient verbalizes understanding   Follow Up Instructions: See PCP in 2-3 months   I discussed the assessment and treatment plan with the patient. The patient was provided an opportunity to ask questions and all were answered. The patient agreed with the  plan and demonstrated an understanding of the instructions.   The patient was advised to call back or seek an in-person evaluation if the symptoms worsen or if the condition fails to improve as anticipated.  I provided 14 minutes of non-face-to-face time during this encounter.   Freeman Caldron, PA-C  Patient ID: Natalie Thornton, female   DOB: 08/12/1985, 34 y.o.   MRN: [id number]

## 2019-10-31 LAB — COMPREHENSIVE METABOLIC PANEL
ALT: 11 IU/L (ref 0–32)
AST: 20 IU/L (ref 0–40)
Albumin/Globulin Ratio: 1.6 (ref 1.2–2.2)
Albumin: 4.1 g/dL (ref 3.8–4.8)
Alkaline Phosphatase: 36 IU/L — ABNORMAL LOW (ref 48–121)
BUN/Creatinine Ratio: 9 (ref 9–23)
BUN: 8 mg/dL (ref 6–20)
Bilirubin Total: 0.3 mg/dL (ref 0.0–1.2)
CO2: 23 mmol/L (ref 20–29)
Calcium: 9.5 mg/dL (ref 8.7–10.2)
Chloride: 100 mmol/L (ref 96–106)
Creatinine, Ser: 0.91 mg/dL (ref 0.57–1.00)
GFR calc Af Amer: 97 mL/min/{1.73_m2} (ref >59)
GFR calc non Af Amer: 84 mL/min/{1.73_m2} (ref >59)
Globulin, Total: 2.5 g/dL (ref 1.5–4.5)
Glucose: 76 mg/dL (ref 65–99)
Potassium: 4.8 mmol/L (ref 3.5–5.2)
Sodium: 139 mmol/L (ref 134–144)
Total Protein: 6.6 g/dL (ref 6.0–8.5)

## 2019-10-31 LAB — VITAMIN D 25 HYDROXY (VIT D DEFICIENCY, FRACTURES): Vit D, 25-Hydroxy: 27.9 ng/mL — ABNORMAL LOW (ref 30.0–100.0)

## 2019-10-31 LAB — TSH: TSH: 1.01 u[IU]/mL (ref 0.450–4.500)

## 2019-11-01 LAB — H. PYLORI BREATH TEST: H pylori Breath Test: NEGATIVE

## 2019-11-04 ENCOUNTER — Encounter: Payer: Self-pay | Admitting: Physician Assistant

## 2019-11-08 ENCOUNTER — Encounter: Payer: Self-pay | Admitting: Internal Medicine

## 2019-11-18 ENCOUNTER — Ambulatory Visit: Payer: Self-pay | Admitting: Orthopedic Surgery

## 2019-11-20 ENCOUNTER — Other Ambulatory Visit: Payer: Self-pay

## 2019-11-20 NOTE — Progress Notes (Signed)
Abstraction of notes for appt with Dr Trula Slade on 12/12/19    Pt having Natalie Thornton approach for spinal surgery.

## 2019-11-23 ENCOUNTER — Other Ambulatory Visit: Payer: Self-pay

## 2019-11-23 ENCOUNTER — Ambulatory Visit (INDEPENDENT_AMBULATORY_CARE_PROVIDER_SITE_OTHER): Payer: Self-pay

## 2019-11-23 ENCOUNTER — Ambulatory Visit (HOSPITAL_COMMUNITY)
Admission: EM | Admit: 2019-11-23 | Discharge: 2019-11-23 | Disposition: A | Discharge status: Home / Self Care | Payer: Self-pay | Attending: Family Medicine | Admitting: Family Medicine

## 2019-11-23 DIAGNOSIS — S63616A Unspecified sprain of right little finger, initial encounter: Secondary | ICD-10-CM

## 2019-11-23 DIAGNOSIS — S6991XA Unspecified injury of right wrist, hand and finger(s), initial encounter: Secondary | ICD-10-CM

## 2019-11-23 NOTE — ED Triage Notes (Signed)
Pt c/o pain/injury to right fifth finger approx 3 days ago. Pt states while she was hand sweeping/removing debris from her bed, her finger hit/jammed the wooden rail/frame of her bed. Finger edematous at mid and proximal joint and toward hand, difficulty making a fist/limited ROM of distal and mid joint.   Finger warm, brisk cap refill. Took tylenol 1 hour PTA

## 2019-11-23 NOTE — Discharge Instructions (Signed)
Keep the finger buddy taped  If not improving significantly over the next few days, follow up with sports medicine center  Over the counter ibuprofen every 6 hours as per label for pain

## 2019-11-23 NOTE — ED Provider Notes (Signed)
Natalie Thornton    CSN: [id number] Arrival date & time: 11/23/19  1658      History   Chief Complaint Chief Complaint  Patient presents with  . Finger Injury    HPI Natalie Thornton is a 34 y.o. female.   Patient reports for evaluation of right pinky pain.  She reports 3 days ago she jammed the pinky on a shelf.  She reports since then she has had pain over the joint of the pinky.  Hurts to bend but she is able to move it.  Denies cutting the pinky.  Pain has not gotten better and that is why she reports none today.  Denies numbness or tingling.  Denies other hand pain.  No previous injuries.     Past Medical History:  Diagnosis Date  . Anxiety   . Arthritis   . Chronic back pain   . DDD (degenerative disc disease)   . Degeneration of intervertebral disc of lumbar region 07/30/2019  . Degenerative disc disease, cervical   . Disc disease, degenerative, cervical   . H pylori ulcer   . H/O degenerative disc disease 2013  . History of hiatal hernia   . Low back pain   . Numbness    Whole back   . Pilonidal cyst     Patient Active Problem List   Diagnosis Date Noted  . Lipoma of back 06/05/2019  . S/P excision of lipoma 09/12/2018  . Chronic bilateral thoracic back pain 07/17/2018  . DDD (degenerative disc disease), lumbar 06/14/2018  . Diabetes mellitus screening 01/06/2017  . Tobacco abuse 01/06/2017  . Anxiety and depression 01/06/2017  . Dyspnea on exertion 10/20/2016  . Cigarette smoker 10/20/2016  . Solitary pulmonary nodule 10/20/2016  . Acute contact dermatitis 10/20/2016  . Pilonidal sinus 07/05/2011    Past Surgical History:  Procedure Laterality Date  . COLONOSCOPY    . LIPOMA EXCISION Right 08/10/2018   Procedure: EXCISION LIPOMA, RIGHT;  Surgeon: Fredirick Maudlin, MD;  Location: [facility];  Service: General;  Laterality: Right;    OB History   No obstetric history on file.      Home Medications    Prior to Admission medications     Medication Sig Start Date End Date Taking? Authorizing Provider  diclofenac Sodium (VOLTAREN) 1 % GEL Apply 2 g topically 4 (four) times daily. 08/15/19   Ladell Pier, MD  hydrOXYzine (ATARAX/VISTARIL) 25 MG tablet Take 25 mg by mouth every 8 (eight) hours as needed.    [provider]  omeprazole (PRILOSEC) 20 MG capsule Take 1 capsule (20 mg total) by mouth daily. 10/30/19   Argentina Donovan, PA-C  albuterol (VENTOLIN HFA) 108 (90 Base) MCG/ACT inhaler Inhale 2 puffs into the lungs every 2 (two) hours as needed for wheezing or shortness of breath (cough). Patient not taking: Reported on 06/13/2019 03/08/19 06/15/19  Muthersbaugh, Jarrett Soho, PA-C  sucralfate (CARAFATE) 1 GM/10ML suspension Take 10 mLs (1 g total) by mouth 4 (four) times daily -  with meals and at bedtime. Patient not taking: Reported on 06/13/2019 06/09/19 06/15/19  Jaynee Eagles, PA-C    Family History Family History  Problem Relation Age of Onset  . Healthy Mother   . Healthy Father   . AAA (abdominal aortic aneurysm) Maternal Grandmother   . Leukemia Maternal Grandmother   . Breast cancer Maternal Grandmother   . Head & neck cancer Paternal Grandmother        Back cancer  . Lymphoma  Paternal Grandmother   . Arthritis Paternal Grandmother   . Cirrhosis Maternal Uncle   . Breast cancer Other   . Arthritis Paternal Grandfather   . Colon cancer Neg Hx   . Esophageal cancer Neg Hx   . Rectal cancer Neg Hx   . Stomach cancer Neg Hx     Social History Social History   Tobacco Use  . Smoking status: Current Some Day Smoker    Years: 10.00    Types: Cigars  . Smokeless tobacco: Never Used  . Tobacco comment: BLACK AND MILDS. 2 pks per week  Vaping Use  . Vaping Use: Never used  Substance Use Topics  . Alcohol use: Yes    Comment: occasionally  . Drug use: No     Allergies   Patient has no known allergies.   Review of Systems Review of Systems   Physical Exam Triage Vital Signs ED Triage Vitals   Enc Vitals Group     BP 11/23/19 1715 118/74     Pulse Rate 11/23/19 1715 76     Resp 11/23/19 1715 16     Temp 11/23/19 1715 99.6 F (37.6 C)     Temp Source 11/23/19 1715 Oral     SpO2 11/23/19 1715 100 %     Weight --      Height --      Head Circumference --      Peak Flow --      Pain Score 11/23/19 1712 6     Pain Loc --      Pain Edu? --      Excl. in [place]? --    No data found.  Updated Vital Signs BP 118/74 (BP Location: Left Arm)   Pulse 76   Temp 99.6 F (37.6 C) (Oral)   Resp 16   LMP 11/14/2019 (Approximate)   SpO2 100%   Visual Acuity Right Eye Distance:   Left Eye Distance:   Bilateral Distance:    Right Eye Near:   Left Eye Near:    Bilateral Near:     Physical Exam Vitals and nursing note reviewed.  Constitutional:      Appearance: Normal appearance.  Musculoskeletal:     Comments: Right pinky with DIP swelling and tenderness.  Patient does have range of motion in flexion and resisted flexion.  Good extension.  Cap refill less than 2 seconds.  Joint feels stable.  Sensation intact.  No tenderness over the MCP joint.  Skin:    General: Skin is warm and dry.  Neurological:     Mental Status: She is alert.      UC Treatments / Results  Labs (all labs ordered are listed, but only abnormal results are displayed) Labs Reviewed - No data to display  EKG   Radiology No results found. Wet read: No bony fractures.  No dislocations.  No significant soft tissue swelling.  Procedures Procedures (including critical care time)  Medications Ordered in UC Medications - No data to display  Initial Impression / Assessment and Plan / UC Course  I have reviewed the triage vital signs and the nursing notes.  Pertinent labs & imaging results that were available during my care of the patient were reviewed by me and considered in my medical decision making (see chart for details).     #Right little finger sprain Patient is a 34 year old female with  likely right fifth digit sprain.  X-ray reviewed with sports medicine attending physician Dr. Clearance Coots as  radiology read was not in prior to discharge.  We do not see any fractures.  We will place in buddy tape and have her follow-up with sports medicine next week for reevaluation if she has not had significant improvement.  Patient verbalized agreement with this plan. Final Clinical Impressions(s) / UC Diagnoses   Final diagnoses:  Sprain of right little finger, unspecified site of digit, initial encounter     Discharge Instructions     Keep the finger buddy taped  If not improving significantly over the next few days, follow up with sports medicine center  Over the counter ibuprofen every 6 hours as per label for pain       ED Prescriptions    None     PDMP not reviewed this encounter.   Purnell Shoemaker, PA-C 11/23/19 1926

## 2019-12-02 ENCOUNTER — Encounter: Payer: Medicaid Other | Admitting: Surgery

## 2019-12-03 ENCOUNTER — Ambulatory Visit (HOSPITAL_COMMUNITY): Admission: EM | Admit: 2019-12-03 | Discharge: 2019-12-03 | Discharge status: Against Medical Advice | Payer: 59

## 2019-12-03 ENCOUNTER — Ambulatory Visit (HOSPITAL_COMMUNITY)
Admission: EM | Admit: 2019-12-03 | Discharge: 2019-12-03 | Disposition: A | Discharge status: Home / Self Care | Payer: 59 | Attending: Family Medicine | Admitting: Family Medicine

## 2019-12-03 ENCOUNTER — Other Ambulatory Visit: Payer: Self-pay

## 2019-12-03 ENCOUNTER — Encounter (HOSPITAL_COMMUNITY): Payer: Self-pay | Admitting: Emergency Medicine

## 2019-12-03 DIAGNOSIS — M5441 Lumbago with sciatica, right side: Secondary | ICD-10-CM | POA: Diagnosis not present

## 2019-12-03 MED ORDER — PREDNISONE 10 MG (21) PO TBPK
ORAL_TABLET | Freq: Every day | ORAL | 0 refills | Status: DC
Start: 2019-12-03 — End: 2020-05-04

## 2019-12-03 NOTE — ED Notes (Signed)
PT CALLED ONCE WITH NO ANSWER

## 2019-12-03 NOTE — ED Triage Notes (Signed)
Pt c/o right sided lower back pain radiating to her right leg. She states she used a heating pad which helped but she is still having pain.

## 2019-12-04 NOTE — ED Provider Notes (Signed)
[place]   [id number] 12/03/19 Arrival Time: 4650  ASSESSMENT & PLAN:  1. Acute right-sided low back pain with right-sided sciatica      Able to ambulate here and hemodynamically stable. No indication for imaging of back at this time given no trauma and normal neurological exam. Discussed.  Begin trial of: Meds ordered this encounter  Medications   predniSONE (STERAPRED UNI-PAK 21 TAB) 10 MG (21) TBPK tablet    Sig: Take by mouth daily. Take as directed.    Dispense:  21 tablet    Refill:  0    Activities as tolerated. Encourage ROM/movement as tolerated.  Recommend:  Follow-up Information    [facility].   Why: If worsening or failing to improve as anticipated. Contact information: [address] [place] [phone]              Reviewed expectations re: course of current medical issues. Questions answered. Outlined signs and symptoms indicating need for more acute intervention. Patient verbalized understanding. After Visit Summary given.   SUBJECTIVE: History from: patient.  Natalie Thornton is a 34 y.o. female who presents with complaint of intermittent right sided lower back/buttock pain that radiates to her right thigh. First noted a few d ago; off and on; no specific trigger. Question if certain movements worsen. Normal bowel/bladder habits. No extremity sensation changes or weakness. Ambulatory without difficulty. Heating pad without relief; no analgesics tried. No abd pain.  Reports no chronic steroid use, fevers, IV drug use, or recent back surgeries or procedures.    OBJECTIVE:  Vitals:   12/03/19 1821  BP: (!) 144/89  Pulse: 98  Resp: 14  Temp: 100 F (37.8 C)  TempSrc: Oral  SpO2: 99%    General appearance: alert; no distress HEENT: ; AT Neck: supple with FROM; without midline tenderness CV: regular Lungs: unlabored respirations; speaks full sentences  without difficulty Abdomen: soft, non-tender; non-distended Back: mild  and poorly localized tenderness to palpation over right lumbar musculature; FROM at waist; bruising: none; without midline tenderness Extremities: without edema; symmetrical without gross deformities; normal ROM of bilateral LE Skin: warm and dry Neurologic: normal gait; normal sensation and strength of bilateral LE Psychological: alert and cooperative; normal mood and affect    No Known Allergies  Past Medical History:  Diagnosis Date   Anxiety    Arthritis    Chronic back pain    DDD (degenerative disc disease)    Degeneration of intervertebral disc of lumbar region 07/30/2019   Degenerative disc disease, cervical    Disc disease, degenerative, cervical    H pylori ulcer    H/O degenerative disc disease 2013   History of hiatal hernia    Low back pain    Numbness    Whole back    Pilonidal cyst    Social History   Socioeconomic History   Marital status: Single    Spouse name: Not on file   Number of children: 2   Years of education: Not on file   Highest education level: Not on file  Occupational History   Occupation: [employer]   Tobacco Use   Smoking status: Current Some Day Smoker    Years: 10.00    Types: Cigars   Smokeless tobacco: Never Used   Tobacco comment: BLACK AND MILDS. 2 pks per week  Vaping Use   Vaping Use: Never used  Substance and Sexual Activity   Alcohol use: Yes  Comment: occasionally   Drug use: No   Sexual activity: Not on file    Comment: oral intercourse only  Other Topics Concern   Not on file  Social History Narrative   Not on file   Social Determinants of Health   Financial Resource Strain:    Difficulty of Paying Living Expenses:   Food Insecurity:    Worried About Charity fundraiser in the Last Year:    Arboriculturist in the Last Year:   Transportation Needs:    Film/video editor (Medical):    Lack of  Transportation (Non-Medical):   Physical Activity:    Days of Exercise per Week:    Minutes of Exercise per Session:   Stress:    Feeling of Stress :   Social Connections:    Frequency of Communication with Friends and Family:    Frequency of Social Gatherings with Friends and Family:    Attends Religious Services:    Active Member of Clubs or Organizations:    Attends Music therapist:    Marital Status:   Intimate Partner Violence:    Fear of Current or Ex-Partner:    Emotionally Abused:    Physically Abused:    Sexually Abused:    Family History  Problem Relation Age of Onset   Healthy Mother    Healthy Father    AAA (abdominal aortic aneurysm) Maternal Grandmother    Leukemia Maternal Grandmother    Breast cancer Maternal Grandmother    Head & neck cancer Paternal Grandmother        Back cancer   Lymphoma Paternal Grandmother    Arthritis Paternal Grandmother    Cirrhosis Maternal Uncle    Breast cancer Other    Arthritis Paternal Grandfather    Colon cancer Neg Hx    Esophageal cancer Neg Hx    Rectal cancer Neg Hx    Stomach cancer Neg Hx    Past Surgical History:  Procedure Laterality Date   COLONOSCOPY     LIPOMA EXCISION Right 08/10/2018   Procedure: EXCISION LIPOMA, RIGHT;  Surgeon: Fredirick Maudlin, MD;  Location: [facility];  Service: General;  Laterality: Right;     Vanessa Kick, MD 12/04/19 1124

## 2019-12-05 ENCOUNTER — Inpatient Hospital Stay (HOSPITAL_COMMUNITY): Admission: RE | Admit: 2019-12-05 | Payer: Medicaid Other | Source: Ambulatory Visit

## 2019-12-06 ENCOUNTER — Ambulatory Visit: Payer: 59 | Attending: Internal Medicine | Admitting: Internal Medicine

## 2019-12-10 ENCOUNTER — Other Ambulatory Visit (HOSPITAL_COMMUNITY): Payer: Medicaid Other

## 2019-12-12 ENCOUNTER — Inpatient Hospital Stay: Admit: 2019-12-12 | Payer: Medicaid Other | Admitting: Orthopedic Surgery

## 2019-12-12 SURGERY — ANTERIOR LUMBAR DISC ARTHROPLASTY
Anesthesia: General

## 2019-12-30 ENCOUNTER — Ambulatory Visit: Payer: 59 | Admitting: Internal Medicine

## 2020-01-30 ENCOUNTER — Ambulatory Visit: Payer: Medicaid Other | Admitting: Internal Medicine

## 2020-03-08 IMAGING — US ULTRASOUND RIGHT BREAST LIMITED
1 series · 3 of 3 positions shown · non-contrast
Comparison: Previous exam(s).

CLINICAL DATA: Focal pain in the left breast.

EXAM:
DIGITAL DIAGNOSTIC BILATERAL MAMMOGRAM WITH CAD AND TOMO
ULTRASOUND BILATERAL BREAST

[Series 1: ultrasound right breast limited · 0.05mm/px · 3 of 3 slices shown]
[im 1/3]
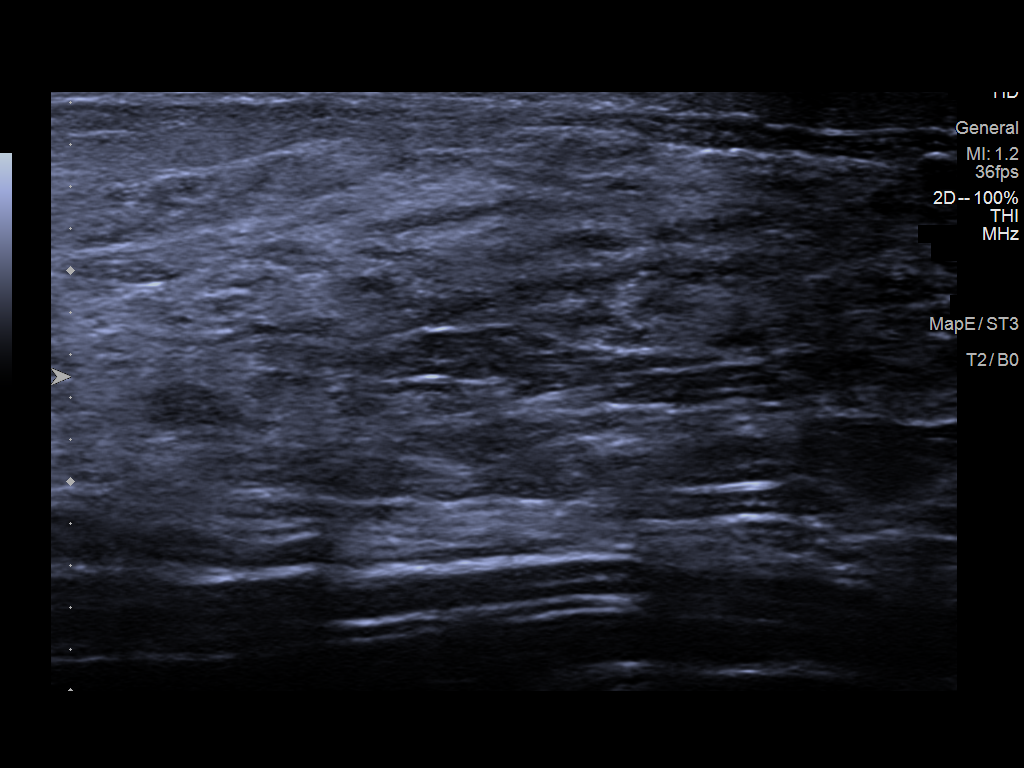
[im 2/3]
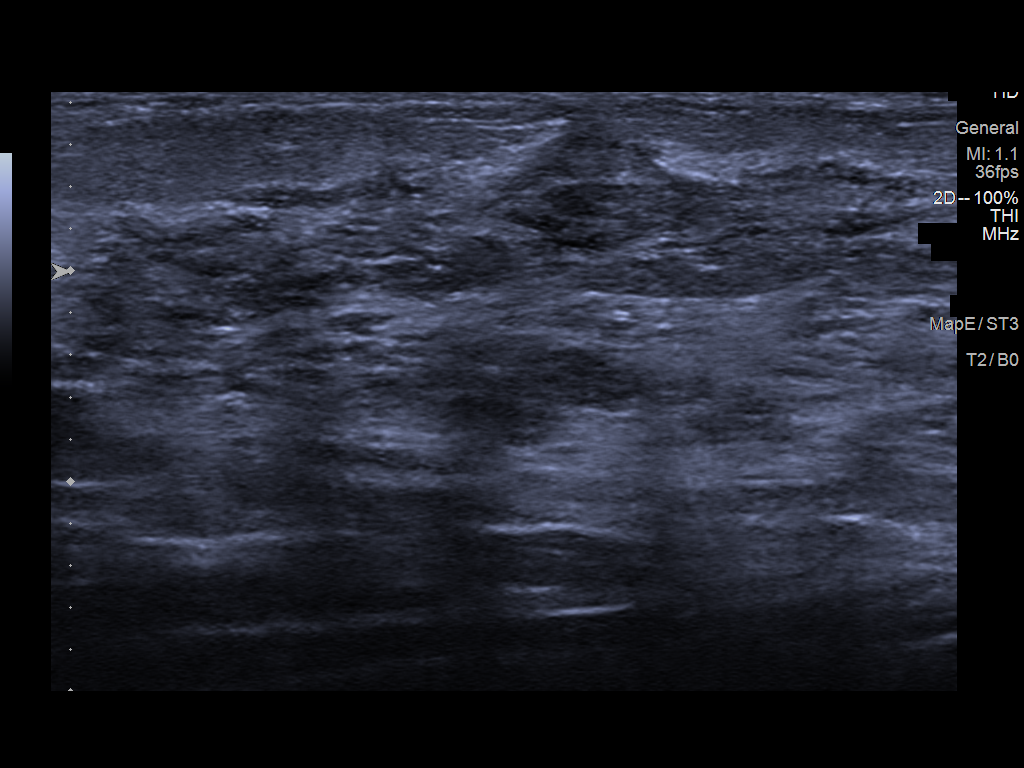
[im 3/3]
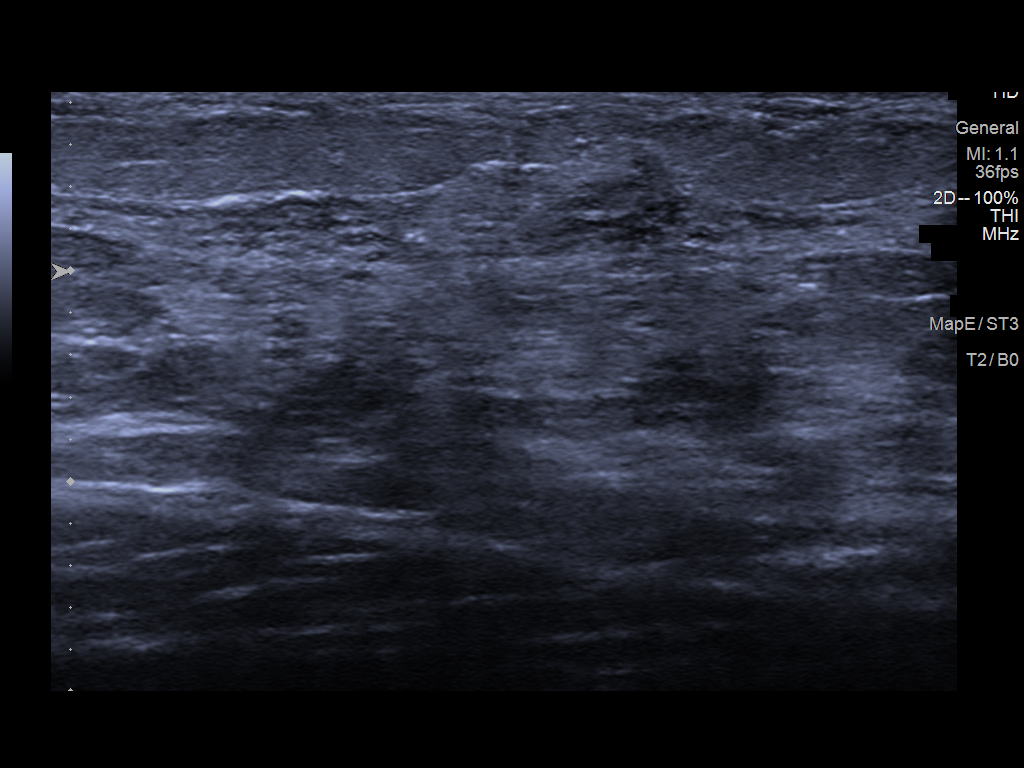

[3 of 3 positions shown; findings below may reference images not displayed]

ACR Breast Density Category c: The breast tissue is heterogeneously
dense, which may obscure small masses.
FINDINGS: There is an asymmetry in the central posterior right breast on the
CC view only. This improves but does not completely resolve on
additional imaging. No suspicious findings on the left.

Mammographic images were processed with CAD.

On physical exam, no suspicious lumps are identified.

Targeted ultrasound is performed, showing no sonographic correlate
for the right breast asymmetry. No sonographic abnormality in the
region of the patient's pain.
IMPRESSION: Probably benign asymmetry on the right. No evidence of malignancy
elsewhere in either breast.

RECOMMENDATION:
Recommend a six-month follow-up mammogram of the probably benign
right breast asymmetry.

I have discussed the findings and recommendations with the patient.
Results were also provided in writing at the conclusion of the
visit. If applicable, a reminder letter will be sent to the patient
regarding the next appointment.

BI-RADS CATEGORY  3: Probably benign.

## 2020-04-13 ENCOUNTER — Encounter: Payer: 59 | Admitting: Family

## 2020-04-13 NOTE — Progress Notes (Signed)
Patient did not show for appointment.   

## 2020-05-04 ENCOUNTER — Encounter (HOSPITAL_COMMUNITY): Payer: Self-pay | Admitting: *Deleted

## 2020-05-04 ENCOUNTER — Other Ambulatory Visit: Payer: Self-pay

## 2020-05-04 ENCOUNTER — Ambulatory Visit (HOSPITAL_COMMUNITY)
Admission: EM | Admit: 2020-05-04 | Discharge: 2020-05-04 | Disposition: A | Discharge status: Home / Self Care | Payer: 59 | Attending: Internal Medicine | Admitting: Internal Medicine

## 2020-05-04 DIAGNOSIS — J029 Acute pharyngitis, unspecified: Secondary | ICD-10-CM

## 2020-05-04 DIAGNOSIS — F17210 Nicotine dependence, cigarettes, uncomplicated: Secondary | ICD-10-CM | POA: Insufficient documentation

## 2020-05-04 DIAGNOSIS — B9789 Other viral agents as the cause of diseases classified elsewhere: Secondary | ICD-10-CM | POA: Insufficient documentation

## 2020-05-04 DIAGNOSIS — R051 Acute cough: Secondary | ICD-10-CM | POA: Insufficient documentation

## 2020-05-04 DIAGNOSIS — Z20822 Contact with and (suspected) exposure to covid-19: Secondary | ICD-10-CM | POA: Diagnosis not present

## 2020-05-04 DIAGNOSIS — J028 Acute pharyngitis due to other specified organisms: Secondary | ICD-10-CM | POA: Insufficient documentation

## 2020-05-04 MED ORDER — MOUTHWASH COMPOUNDING BASE PO LIQD
15.0000 mL | ORAL | 0 refills | Status: DC | PRN
Start: 2020-05-04 — End: 2020-09-10

## 2020-05-04 NOTE — ED Provider Notes (Signed)
[place]    CSN: [id number] Arrival date & time: 05/04/20  1742      History   Chief Complaint Chief Complaint  Patient presents with  . Sore Throat  . Cough    HPI Natalie Thornton is a 33 y.o. female comes to urgent care with with sore throat and nonproductive cough of 3 days duration.  Patient denies any fever, chills, shortness of breath or wheezing.  No nausea, vomiting or diarrhea.  No sick contacts.  Patient works at [employer].   Patient is not vaccinated against COVID-19 virus.  She has some body aches but denies any headaches.  No dizziness, near syncope or syncopal episodes.  HPI  Past Medical History:  Diagnosis Date  . Anxiety   . Arthritis   . Chronic back pain   . DDD (degenerative disc disease)   . Degeneration of intervertebral disc of lumbar region 07/30/2019  . Degenerative disc disease, cervical   . Disc disease, degenerative, cervical   . H pylori ulcer   . H/O degenerative disc disease 2013  . History of hiatal hernia   . Low back pain   . Numbness    Whole back   . Pilonidal cyst     Patient Active Problem List   Diagnosis Date Noted  . Lipoma of back 06/05/2019  . S/P excision of lipoma 09/12/2018  . Chronic bilateral thoracic back pain 07/17/2018  . DDD (degenerative disc disease), lumbar 06/14/2018  . Diabetes mellitus screening 01/06/2017  . Tobacco abuse 01/06/2017  . Anxiety and depression 01/06/2017  . Dyspnea on exertion 10/20/2016  . Cigarette smoker 10/20/2016  . Solitary pulmonary nodule 10/20/2016  . Acute contact dermatitis 10/20/2016  . Pilonidal sinus 07/05/2011    Past Surgical History:  Procedure Laterality Date  . COLONOSCOPY    . LIPOMA EXCISION Right 08/10/2018   Procedure: EXCISION LIPOMA, RIGHT;  Surgeon: Fredirick Maudlin, MD;  Location: [facility];  Service: General;  Laterality: Right;    OB History   No obstetric history on file.      Home Medications    Prior to Admission medications    Medication Sig Start Date End Date Taking? Authorizing Provider  Mouthwash Compounding Base LIQD Swish and spit 15 mLs as needed. Formulation: 1.  Benadryl 12.5 mg/ML-QS 2 to 40 mL 2.  Hydrocortisone suspension 60 mg 3.  Nystatin 100,000 units/ml- 35ml suspension 05/04/20   Jillian Pianka, Myrene Galas, MD  albuterol (VENTOLIN HFA) 108 (90 Base) MCG/ACT inhaler Inhale 2 puffs into the lungs every 2 (two) hours as needed for wheezing or shortness of breath (cough). Patient not taking: Reported on 06/13/2019 03/08/19 06/15/19  Muthersbaugh, Jarrett Soho, PA-C  omeprazole (PRILOSEC) 20 MG capsule Take 1 capsule (20 mg total) by mouth daily. Patient not taking: Reported on 11/27/2019 10/30/19 12/03/19  Argentina Donovan, PA-C  sucralfate (CARAFATE) 1 GM/10ML suspension Take 10 mLs (1 g total) by mouth 4 (four) times daily -  with meals and at bedtime. Patient not taking: Reported on 06/13/2019 06/09/19 06/15/19  Jaynee Eagles, PA-C    Family History Family History  Problem Relation Age of Onset  . Healthy Mother   . Healthy Father   . AAA (abdominal aortic aneurysm) Maternal Grandmother   . Leukemia Maternal Grandmother   . Breast cancer Maternal Grandmother   . Head & neck cancer Paternal Grandmother        Back cancer  . Lymphoma Paternal Grandmother   . Arthritis Paternal Grandmother   .  Cirrhosis Maternal Uncle   . Breast cancer Other   . Arthritis Paternal Grandfather   . Colon cancer Neg Hx   . Esophageal cancer Neg Hx   . Rectal cancer Neg Hx   . Stomach cancer Neg Hx     Social History Social History   Tobacco Use  . Smoking status: Current Some Day Smoker    Years: 10.00    Types: Cigars  . Smokeless tobacco: Never Used  . Tobacco comment: BLACK AND MILDS. 2 pks per week  Vaping Use  . Vaping Use: Never used  Substance Use Topics  . Alcohol use: Yes    Comment: occasionally  . Drug use: No     Allergies   Patient has no known allergies.   Review of Systems Review of Systems    Constitutional: Positive for activity change. Negative for appetite change, chills and fatigue.  HENT: Positive for rhinorrhea and sore throat. Negative for congestion and voice change.   Respiratory: Positive for cough. Negative for choking, chest tightness and shortness of breath.   Neurological: Negative.      Physical Exam Triage Vital Signs ED Triage Vitals  Enc Vitals Group     BP 05/04/20 1906 121/69     Pulse Rate 05/04/20 1906 84     Resp 05/04/20 1906 18     Temp 05/04/20 1906 99.1 F (37.3 C)     Temp Source 05/04/20 1906 Oral     SpO2 --      Weight 05/04/20 1908 136 lb (61.7 kg)     Height 05/04/20 1908 5' (1.524 m)     Head Circumference --      Peak Flow --      Pain Score 05/04/20 1907 10     Pain Loc --      Pain Edu? --      Excl. in [place]? --    No data found.  Updated Vital Signs BP 121/69 (BP Location: Right Arm)   Pulse 84   Temp 99.1 F (37.3 C) (Oral)   Resp 18   Ht 5' (1.524 m)   Wt 61.7 kg   LMP 04/17/2020   BMI 26.56 kg/m   Visual Acuity Right Eye Distance:   Left Eye Distance:   Bilateral Distance:    Right Eye Near:   Left Eye Near:    Bilateral Near:     Physical Exam Vitals and nursing note reviewed.  Constitutional:      Appearance: She is not toxic-appearing or diaphoretic.  HENT:     Right Ear: Tympanic membrane normal.     Left Ear: Tympanic membrane normal. No swelling.     Nose: No congestion or rhinorrhea.     Mouth/Throat:     Mouth: Mucous membranes are moist.     Pharynx: Posterior oropharyngeal erythema present. No pharyngeal swelling or oropharyngeal exudate.     Tonsils: No tonsillar exudate. 1+ on the right. 1+ on the left.  Cardiovascular:     Rate and Rhythm: Normal rate and regular rhythm.  Pulmonary:     Effort: Pulmonary effort is normal.     Breath sounds: Normal breath sounds.  Abdominal:     Palpations: Abdomen is soft.  Neurological:     Mental Status: She is alert.      UC Treatments /  Results  Labs (all labs ordered are listed, but only abnormal results are displayed) Labs Reviewed  SARS CORONAVIRUS 2 (TAT 6-24 HRS)  EKG   Radiology No results found.  Procedures Procedures (including critical care time)  Medications Ordered in UC Medications - No data to display  Initial Impression / Assessment and Plan / UC Course  I have reviewed the triage vital signs and the nursing notes.  Pertinent labs & imaging results that were available during my care of the patient were reviewed by me and considered in my medical decision making (see chart for details).     1.  Acute viral pharyngitis: COVID-19 PCR test sent Warm salt water gargle Push oral fluids Use mouthwash swish and spit Patient is advised to quarantine until COVID-19 test results available Tylenol/Motrin as needed Final Clinical Impressions(s) / UC Diagnoses   Final diagnoses:  Acute viral pharyngitis     Discharge Instructions     Warm salt water gargle Use mouthwash as recommended We will call you if testing results are significant Encourage you to sign up for MyChart to see your results.   ED Prescriptions    Medication Sig Dispense Auth. Provider   Mouthwash Compounding Base LIQD Swish and spit 15 mLs as needed. Formulation: 1.  Benadryl 12.5 mg/ML-QS 2 to 40 mL 2.  Hydrocortisone suspension 60 mg 3.  Nystatin 100,000 units/ml- 33ml suspension 240 mL Ivon Roedel, Myrene Galas, MD     PDMP not reviewed this encounter.   Chase Picket, MD 05/04/20 2002

## 2020-05-04 NOTE — Discharge Instructions (Addendum)
Warm salt water gargle Use mouthwash as recommended We will call you if testing results are significant Encourage you to sign up for MyChart to see your results.

## 2020-05-04 NOTE — ED Triage Notes (Signed)
Pt reports up to 3 days ago the sore throat and cough started.

## 2020-05-05 LAB — SARS CORONAVIRUS 2 (TAT 6-24 HRS): SARS Coronavirus 2: NEGATIVE

## 2020-06-04 ENCOUNTER — Other Ambulatory Visit: Payer: Self-pay

## 2020-06-04 ENCOUNTER — Ambulatory Visit (HOSPITAL_COMMUNITY)
Admission: EM | Admit: 2020-06-04 | Discharge: 2020-06-04 | Disposition: A | Discharge status: Home / Self Care | Payer: 59 | Attending: Emergency Medicine | Admitting: Emergency Medicine

## 2020-06-04 ENCOUNTER — Ambulatory Visit: Payer: Self-pay

## 2020-06-04 ENCOUNTER — Encounter (HOSPITAL_COMMUNITY): Payer: Self-pay | Admitting: Emergency Medicine

## 2020-06-04 DIAGNOSIS — N390 Urinary tract infection, site not specified: Secondary | ICD-10-CM | POA: Diagnosis not present

## 2020-06-04 DIAGNOSIS — R110 Nausea: Secondary | ICD-10-CM | POA: Diagnosis not present

## 2020-06-04 DIAGNOSIS — R1084 Generalized abdominal pain: Secondary | ICD-10-CM

## 2020-06-04 DIAGNOSIS — R197 Diarrhea, unspecified: Secondary | ICD-10-CM

## 2020-06-04 LAB — POCT URINALYSIS DIPSTICK, ED / UC
Bilirubin Urine: NEGATIVE
Glucose, UA: NEGATIVE mg/dL
Ketones, ur: NEGATIVE mg/dL
Nitrite: NEGATIVE
Protein, ur: NEGATIVE mg/dL
Specific Gravity, Urine: 1.025 (ref 1.005–1.030)
Urobilinogen, UA: 0.2 mg/dL (ref 0.0–1.0)
pH: 6 (ref 5.0–8.0)

## 2020-06-04 MED ORDER — ONDANSETRON 4 MG PO TBDP
4.0000 mg | ORAL_TABLET | Freq: Once | ORAL | Status: DC
Start: 2020-06-04 — End: 2020-06-04

## 2020-06-04 MED ORDER — ONDANSETRON HCL 4 MG/2ML IJ SOLN
INTRAMUSCULAR | Status: AC
  Filled 2020-06-04: qty 2

## 2020-06-04 MED ORDER — SULFAMETHOXAZOLE-TRIMETHOPRIM 800-160 MG PO TABS: 1.0000 | ORAL_TABLET | Freq: Two times a day (BID) | ORAL | 0 refills | Status: AC

## 2020-06-04 MED ORDER — ALUM & MAG HYDROXIDE-SIMETH 200-200-20 MG/5ML PO SUSP
30.0000 mL | Freq: Once | ORAL | Status: AC
  Administered 2020-06-04: 30 mL via ORAL

## 2020-06-04 MED ORDER — ONDANSETRON HCL 4 MG/2ML IJ SOLN
4.0000 mg | Freq: Once | INTRAMUSCULAR | Status: AC
  Administered 2020-06-04: 4 mg via INTRAMUSCULAR

## 2020-06-04 MED ORDER — ALUM & MAG HYDROXIDE-SIMETH 200-200-20 MG/5ML PO SUSP
ORAL | Status: AC
  Filled 2020-06-04: qty 30

## 2020-06-04 NOTE — ED Triage Notes (Signed)
Pt presents with abdominal pain, nausea, and vomiting xs 5 days. States has sharp pains that comes and goes. Describes pain as a fullness.

## 2020-06-04 NOTE — ED Provider Notes (Signed)
[place]    CSN: [id number] Arrival date & time: 06/04/20  1612      History   Chief Complaint Chief Complaint  Patient presents with   Abdominal Pain   Emesis   Diarrhea    HPI Natalie Thornton is a 34 y.o. female.   Pt is here states n/d no emesis for a few days now. States that she feels "fullness" denies any way she could be preg . Denies any burning on urination. The fullness is worse with foods more so in rt lower quad. Denies an back pain or blood in urine. Took otc meds with no relief. Last BM today loose.      Past Medical History:  Diagnosis Date   Anxiety    Arthritis    Chronic back pain    DDD (degenerative disc disease)    Degeneration of intervertebral disc of lumbar region 07/30/2019   Degenerative disc disease, cervical    Disc disease, degenerative, cervical    H pylori ulcer    H/O degenerative disc disease 2013   History of hiatal hernia    Low back pain    Numbness    Whole back    Pilonidal cyst     Patient Active Problem List   Diagnosis Date Noted   Lipoma of back 06/05/2019   S/P excision of lipoma 09/12/2018   Chronic bilateral thoracic back pain 07/17/2018   DDD (degenerative disc disease), lumbar 06/14/2018   Diabetes mellitus screening 01/06/2017   Tobacco abuse 01/06/2017   Anxiety and depression 01/06/2017   Dyspnea on exertion 10/20/2016   Cigarette smoker 10/20/2016   Solitary pulmonary nodule 10/20/2016   Acute contact dermatitis 10/20/2016   Pilonidal sinus 07/05/2011    Past Surgical History:  Procedure Laterality Date   COLONOSCOPY     LIPOMA EXCISION Right 08/10/2018   Procedure: EXCISION LIPOMA, RIGHT;  Surgeon: Fredirick Maudlin, MD;  Location: [facility] ORS;  Service: General;  Laterality: Right;    OB History   No obstetric history on file.      Home Medications    Prior to Admission medications   Medication Sig Start Date End Date Taking? Authorizing Provider   sulfamethoxazole-trimethoprim (BACTRIM DS) 800-160 MG tablet Take 1 tablet by mouth 2 (two) times daily for 7 days. 06/04/20 06/11/20 Yes Marney Setting, NP  Mouthwash Compounding Base LIQD Swish and spit 15 mLs as needed. Formulation: 1.  Benadryl 12.5 mg/ML-QS 2 to 40 mL 2.  Hydrocortisone suspension 60 mg 3.  Nystatin 100,000 units/ml- 94ml suspension 05/04/20   Lamptey, Myrene Galas, MD  albuterol (VENTOLIN HFA) 108 (90 Base) MCG/ACT inhaler Inhale 2 puffs into the lungs every 2 (two) hours as needed for wheezing or shortness of breath (cough). Patient not taking: Reported on 06/13/2019 03/08/19 06/15/19  Muthersbaugh, Jarrett Soho, PA-C  omeprazole (PRILOSEC) 20 MG capsule Take 1 capsule (20 mg total) by mouth daily. Patient not taking: Reported on 11/27/2019 10/30/19 12/03/19  Argentina Donovan, PA-C  sucralfate (CARAFATE) 1 GM/10ML suspension Take 10 mLs (1 g total) by mouth 4 (four) times daily -  with meals and at bedtime. Patient not taking: Reported on 06/13/2019 06/09/19 06/15/19  Jaynee Eagles, PA-C    Family History Family History  Problem Relation Age of Onset   Healthy Mother    Healthy Father    AAA (abdominal aortic aneurysm) Maternal Grandmother    Leukemia Maternal Grandmother    Breast cancer Maternal Grandmother    Head & neck  cancer Paternal Grandmother        Back cancer   Lymphoma Paternal Grandmother    Arthritis Paternal Grandmother    Cirrhosis Maternal Uncle    Breast cancer Other    Arthritis Paternal Grandfather    Colon cancer Neg Hx    Esophageal cancer Neg Hx    Rectal cancer Neg Hx    Stomach cancer Neg Hx     Social History Social History   Tobacco Use   Smoking status: Current Some Day Smoker    Years: 10.00    Types: Cigars   Smokeless tobacco: Never Used   Tobacco comment: BLACK AND MILDS. 2 pks per week  Vaping Use   Vaping Use: Never used  Substance Use Topics   Alcohol use: Yes    Comment: occasionally   Drug use: No      Allergies   Patient has no known allergies.   Review of Systems Review of Systems  Constitutional: Positive for appetite change.  Respiratory: Negative.   Cardiovascular: Negative.   Gastrointestinal: Positive for abdominal pain, diarrhea and nausea.  Genitourinary: Negative.      Physical Exam Triage Vital Signs ED Triage Vitals  Enc Vitals Group     BP 06/04/20 1839 114/64     Pulse Rate 06/04/20 1839 (!) 55     Resp 06/04/20 1839 16     Temp 06/04/20 1839 98.3 F (36.8 C)     Temp Source 06/04/20 1839 Oral     SpO2 06/04/20 1839 100 %     Weight --      Height --      Head Circumference --      Peak Flow --      Pain Score 06/04/20 1837 9     Pain Loc --      Pain Edu? --      Excl. in [place]? --    No data found.  Updated Vital Signs BP 114/64 (BP Location: Right Arm)    Pulse (!) 55    Temp 98.3 F (36.8 C) (Oral)    Resp 16    LMP 05/18/2020    SpO2 100%   Visual Acuity     Physical Exam Cardiovascular:     Rate and Rhythm: Normal rate.  Pulmonary:     Effort: Pulmonary effort is normal.  Abdominal:     General: Bowel sounds are normal. There is distension.     Palpations: Abdomen is soft.     Tenderness: There is generalized abdominal tenderness. There is no right CVA tenderness, left CVA tenderness, guarding or rebound. Negative signs include Murphy's sign and Rovsing's sign.  Skin:    General: Skin is warm.     Capillary Refill: Capillary refill takes less than 2 seconds.  Neurological:     Mental Status: She is alert.      UC Treatments / Results  Labs (all labs ordered are listed, but only abnormal results are displayed) Labs Reviewed  POCT URINALYSIS DIPSTICK, ED / UC - Abnormal; Notable for the following components:      Result Value   Hgb urine dipstick TRACE (*)    Leukocytes,Ua SMALL (*)    All other components within normal limits    EKG   Radiology No results found.  Procedures Procedures (including critical care  time)  Medications Ordered in UC Medications  alum & mag hydroxide-simeth (MAALOX/MYLANTA) 200-200-20 MG/5ML suspension 30 mL (30 mLs Oral Given 06/04/20 1910)  ondansetron (ZOFRAN) injection  4 mg (4 mg Intramuscular Given 06/04/20 1910)    Initial Impression / Assessment and Plan / UC Course  I have reviewed the triage vital signs and the nursing notes.  Pertinent labs & imaging results that were available during my care of the patient were reviewed by me and considered in my medical decision making (see chart for details).     Avoid spicy foods  You may need to see gastro if symptoms cont  Follow brat diet to help  Urine showed uti take full dose of meds with food     Final Clinical Impressions(s) / UC Diagnoses   Final diagnoses:  Generalized abdominal pain  Nausea  Diarrhea, unspecified type  Lower urinary tract infectious disease   Discharge Instructions   None    ED Prescriptions    Medication Sig Dispense Auth. Provider   sulfamethoxazole-trimethoprim (BACTRIM DS) 800-160 MG tablet Take 1 tablet by mouth 2 (two) times daily for 7 days. 14 tablet Coralyn Mark, NP     PDMP not reviewed this encounter.   Coralyn Mark, NP 06/04/20 [phone]

## 2020-06-04 NOTE — Telephone Encounter (Signed)
Patient clled stating that she has had stomach pain since Saturday.  She states that she was drinking the night befor and woke with stomach bloating and sharp pains.  She states she feels nauseated but no vomiting.  She had some diarrhea but that has resolved and the pain has not.  She rates the pain at 9. No fever. Per protocol patient will go to UC for evaluation of her symptoms.  Care advice read to patient She verbalized understanding and will follow plan of care.  Reason for Disposition . [1] MILD-MODERATE pain AND [2] constant AND [3] present > 2 hours  Answer Assessment - Initial Assessment Questions 1. LOCATION: "Where does it hurt?"     Mid abdomin below belly button 2. RADIATION: "Does the pain shoot anywhere else?" (e.g., chest, back)     no 3. ONSET: "When did the pain begin?" (e.g., minutes, hours or days ago)     saturday 4. SUDDEN: "Gradual or sudden onset?"     Sudden but was drinking night before 5. PATTERN "Does the pain come and go, or is it constant?"    - If constant: "Is it getting better, staying the same, or worsening?"      (Note: Constant means the pain never goes away completely; most serious pain is constant and it progresses)     - If intermittent: "How long does it last?" "Do you have pain now?"     (Note: Intermittent means the pain goes away completely between bouts)     Constant pain sharp at times  6. SEVERITY: "How bad is the pain?"  (e.g., Scale 1-10; mild, moderate, or severe)   - MILD (1-3): doesn't interfere with normal activities, abdomen soft and not tender to touch    - MODERATE (4-7): interferes with normal activities or awakens from sleep, tender to touch    - SEVERE (8-10): excruciating pain, doubled over, unable to do any normal activities      9 7. RECURRENT SYMPTOM: "Have you ever had this type of stomach pain before?" If Yes, ask: "When was the last time?" and "What happened that time?"      no 8. CAUSE: "What do you think is causing the  stomach pain?"     unsure 9. RELIEVING/AGGRAVATING FACTORS: "What makes it better or worse?" (e.g., movement, antacids, bowel movement)     nothing 10. OTHER SYMPTOMS: "Has there been any vomiting, diarrhea, constipation, or urine problems?"      Diarrhea originally nausea 11. PREGNANCY: "Is there any chance you are pregnant?" "When was your last menstrual period?"       Mid december  Protocols used: ABDOMINAL PAIN - [facility]

## 2020-06-18 ENCOUNTER — Ambulatory Visit: Payer: Self-pay | Admitting: *Deleted

## 2020-06-18 NOTE — Telephone Encounter (Signed)
Patient is calling to report her son tested + COVID- she had negative rapid test but has symptoms. Chills, headache, cough sinus pressure, headache, eye pain. Call to office to see if virtual visit can be scheduled. Patient is using thera flu, Nyquil/dayquil, hot tea. Attempted to call office- no answer/no virtual visit appointments available.  Message for possible appointment and patient call back Reason for Disposition . [1] Redness or swelling on the cheek, forehead or around the eye AND [2] no fever . [1] COVID-19 infection suspected by caller or triager AND [2] mild symptoms (cough, fever, or others) AND [3] negative COVID-19 rapid test  Answer Assessment - Initial Assessment Questions 1. ONSET: "When did the pain start?" (e.g., minutes, hours, days)     2 days ago 2. TIMING: "Does the pain come and go, or has it been constant since it started?" (e.g., constant, intermittent, fleeting)     Better-sinus pressure 3. SEVERITY: "How bad is the pain?"   (Scale 1-10; mild, moderate or severe)   - MILD (1-3): doesn't interfere with normal activities    - MODERATE (4-7): interferes with normal activities or awakens from sleep    - SEVERE (8-10): excruciating pain and patient unable to do normal activities     mild 4. LOCATION: "Where does it hurt?"  (e.g., eyelid, eye, cheekbone)     Bilateral- whole eye 5. CAUSE: "What do you think is causing the pain?"     Sinus pressure 6. VISION: "Do you have blurred vision or changes in your vision?"      no 7. EYE DISCHARGE: "Is there any discharge (pus) from the eye(s)?"  If yes, ask: "What color is it?"      no 8. FEVER: "Do you have a fever?" If Yes, ask: "What is it, how was it measured, and when did it start?"      Not sure 9. OTHER SYMPTOMS: "Do you have any other symptoms?" (e.g., headache, nasal discharge, facial rash)     Headache, migraine, sinus pressure 10. PREGNANCY: "Is there any chance you are pregnant?" "When was your last menstrual  period?"       No- LMP- present now  Answer Assessment - Initial Assessment Questions 1. COVID-19 EXPOSURE: "Please describe how you were exposed to someone with a COVID-19 infection."     Son is + COVID 2. PLACE of CONTACT: "Where were you when you were exposed to COVID-19?" (e.g., home, school, medical waiting room; which city?)     home 3. TYPE of CONTACT: "How much contact was there?" (e.g., sitting next to, live in same house, work in same office, same building)     home 4. DURATION of CONTACT: "How long were you in contact with the COVID-19 patient?" (e.g., a few seconds, passed by person, a few minutes, 15 minutes or longer, live with the patient)     Lives with patient 5. MASK: "Were you wearing a mask?" "Was the other person wearing a mask?" Note: wearing a mask reduces the risk of an otherwise close contact.     no 6. DATE of CONTACT: "When did you have contact with a COVID-19 patient?" (e.g., how many days ago)     06/18/19 7. COMMUNITY SPREAD: "Are there lots of cases of COVID-19 (community spread) where you live?" (See public health department website, if unsure)       yes 8. SYMPTOMS: "Do you have any symptoms?" (e.g., fever, cough, breathing difficulty, loss of taste or smell)     Sinus  pressure, cough, eye pain, headache, chills 9. VACCINE: "Have you gotten the COVID-19 vaccine?" If Yes ask: "Which one, how many shots, when did you get it?"     no 10. PREGNANCY OR POSTPARTUM: "Is there any chance you are pregnant?" "When was your last menstrual period?" "Did you deliver in the last 2 weeks?"       no 11. HIGH RISK: "Do you have any heart or lung problems?" "Do you have a weak immune system?" (e.g., heart failure, COPD, asthma, HIV positive, chemotherapy, renal failure, diabetes mellitus, sickle cell anemia, obesity)       no 12. TRAVEL: "Have you traveled out of the country recently?" If Yes, ask: "When and where?" Also ask about out-of-state travel, since the CDC has  identified some high-risk cities for community spread in the [place]. Note: Travel becomes less relevant if there is widespread community transmission where the patient lives.       no  Protocols used: SINUS PAIN OR CONGESTION-A-AH, CORONAVIRUS (COVID-19) DIAGNOSED OR SUSPECTED-A-AH, EYE PAIN-A-AH, CORONAVIRUS (COVID-19) EXPOSURE-A-AH

## 2020-06-19 ENCOUNTER — Encounter: Payer: Self-pay | Admitting: Internal Medicine

## 2020-06-19 ENCOUNTER — Other Ambulatory Visit: Payer: Self-pay

## 2020-06-19 ENCOUNTER — Ambulatory Visit: Payer: 59 | Attending: Internal Medicine | Admitting: Internal Medicine

## 2020-06-19 ENCOUNTER — Telehealth (INDEPENDENT_AMBULATORY_CARE_PROVIDER_SITE_OTHER): Payer: Self-pay

## 2020-06-19 DIAGNOSIS — J069 Acute upper respiratory infection, unspecified: Secondary | ICD-10-CM

## 2020-06-19 NOTE — Progress Notes (Signed)
Pt states she is having pain in b/l eye

## 2020-06-19 NOTE — Progress Notes (Addendum)
This is a telephone visit. Patient understands the limitations associated with telephone visit  4 days ago patient started with URI sxs. ST, rhinorrhea. She lives with her wife and son (daughter is off visiting family). All three were tested for Covid and son was positive. Patient has had covid - 11 or 12 of 2021. ( patient messaged and stated that her Covid dx was several months ago.. this is a late addition to the note) Patient also had eye pain yesterday but that has resolved.  She has been using some OTC meds 9dayquil, nyquil).   Past Medical History:  Diagnosis Date  . Anxiety   . Arthritis   . Chronic back pain   . DDD (degenerative disc disease)   . Degeneration of intervertebral disc of lumbar region 07/30/2019  . Degenerative disc disease, cervical   . Disc disease, degenerative, cervical   . H pylori ulcer   . H/O degenerative disc disease 2013  . History of hiatal hernia   . Low back pain   . Numbness    Whole back   . Pilonidal cyst     Social History   Socioeconomic History  . Marital status: Single    Spouse name: Not on file  . Number of children: 2  . Years of education: Not on file  . Highest education level: Not on file  Occupational History  . Occupation: [employer]   Tobacco Use  . Smoking status: Current Some Day Smoker    Years: 10.00    Types: Cigars  . Smokeless tobacco: Never Used  . Tobacco comment: BLACK AND MILDS. 2 pks per week  Vaping Use  . Vaping Use: Never used  Substance and Sexual Activity  . Alcohol use: Yes    Comment: occasionally  . Drug use: No  . Sexual activity: Not on file    Comment: oral intercourse only  Other Topics Concern  . Not on file  Social History Narrative  . Not on file   Social Determinants of Health   Financial Resource Strain: Not on file  Food Insecurity: Not on file  Transportation Needs: Not on file  Physical Activity: Not on file  Stress: Not on file  Social Connections: Not on file  Intimate  Partner Violence: Not on file    Past Surgical History:  Procedure Laterality Date  . COLONOSCOPY    . LIPOMA EXCISION Right 08/10/2018   Procedure: EXCISION LIPOMA, RIGHT;  Surgeon: Fredirick Maudlin, MD;  Location: [facility] ORS;  Service: General;  Laterality: Right;    Family History  Problem Relation Age of Onset  . Healthy Mother   . Healthy Father   . AAA (abdominal aortic aneurysm) Maternal Grandmother   . Leukemia Maternal Grandmother   . Breast cancer Maternal Grandmother   . Head & neck cancer Paternal Grandmother        Back cancer  . Lymphoma Paternal Grandmother   . Arthritis Paternal Grandmother   . Cirrhosis Maternal Uncle   . Breast cancer Other   . Arthritis Paternal Grandfather   . Colon cancer Neg Hx   . Esophageal cancer Neg Hx   . Rectal cancer Neg Hx   . Stomach cancer Neg Hx     No Known Allergies  Current Outpatient Medications on File Prior to Visit  Medication Sig Dispense Refill  . Mouthwash Compounding Base LIQD Swish and spit 15 mLs as needed. Formulation: 1.  Benadryl 12.5 mg/ML-QS 2 to 40 mL 2.  Hydrocortisone suspension 60  mg 3.  Nystatin 100,000 units/ml- 63ml suspension 240 mL 0  . [DISCONTINUED] albuterol (VENTOLIN HFA) 108 (90 Base) MCG/ACT inhaler Inhale 2 puffs into the lungs every 2 (two) hours as needed for wheezing or shortness of breath (cough). (Patient not taking: Reported on 06/13/2019) 8 g 0  . [DISCONTINUED] omeprazole (PRILOSEC) 20 MG capsule Take 1 capsule (20 mg total) by mouth daily. (Patient not taking: Reported on 11/27/2019) 30 capsule 3  . [DISCONTINUED] sucralfate (CARAFATE) 1 GM/10ML suspension Take 10 mLs (1 g total) by mouth 4 (four) times daily -  with meals and at bedtime. (Patient not taking: Reported on 06/13/2019) 500 mL 0   No current facility-administered medications on file prior to visit.     patient denies chest pain, shortness of breath, orthopnea. Denies lower extremity edema, abdominal pain, change in appetite,  change in bowel movements. Patient denies rashes, musculoskeletal complaints. No other specific complaints in a complete review of systems.   There were no vitals taken for this visit. No examination-- televisit  A/p-patient likely has an upper respiratory viral infection.  She has tested negative for COVID.  She had COVID within the last 8 weeks.  I think it is very unlikely that she would be reinfected with COVID this early.  I have advised conservative measures such as staying hydrated and getting plenty of rest.  She will call us back if she has any other concerns. The additional information does not change the management

## 2020-06-19 NOTE — Telephone Encounter (Signed)
Copied from [place] [id number]. Topic: General - Other >> Jun 19, 2020 11:06 AM Yvette Rack wrote: Reason for CRM: Pt stated she had a virtual visit and she noticed on the after visit summary that the doctor entered incorrect information. Pt stated she informed the doctor that she previously had Covid last year (was not certain of date) but the doctor entered that she had Covid 8 weeks ago and it unlikely that she would contract it again so soon. Pt would like the information be corrected. Pt then stated this is why she was diagnosed incorrectly.

## 2020-07-02 ENCOUNTER — Ambulatory Visit: Payer: 59 | Admitting: Physician Assistant

## 2020-07-23 ENCOUNTER — Other Ambulatory Visit: Payer: Self-pay

## 2020-07-23 ENCOUNTER — Encounter: Payer: Self-pay | Admitting: Physician Assistant

## 2020-07-23 ENCOUNTER — Other Ambulatory Visit: Payer: Self-pay | Admitting: Physician Assistant

## 2020-07-23 ENCOUNTER — Ambulatory Visit: Payer: 59 | Attending: Physician Assistant | Admitting: Physician Assistant

## 2020-07-23 VITALS — BP 113/74 | HR 73 | Wt 142.0 lb

## 2020-07-23 DIAGNOSIS — R110 Nausea: Secondary | ICD-10-CM

## 2020-07-23 DIAGNOSIS — R519 Headache, unspecified: Secondary | ICD-10-CM

## 2020-07-23 DIAGNOSIS — R109 Unspecified abdominal pain: Secondary | ICD-10-CM | POA: Diagnosis not present

## 2020-07-23 DIAGNOSIS — R42 Dizziness and giddiness: Secondary | ICD-10-CM

## 2020-07-23 DIAGNOSIS — N3000 Acute cystitis without hematuria: Secondary | ICD-10-CM

## 2020-07-23 DIAGNOSIS — K5900 Constipation, unspecified: Secondary | ICD-10-CM

## 2020-07-23 LAB — POCT URINALYSIS DIP (CLINITEK)
Bilirubin, UA: NEGATIVE
Blood, UA: NEGATIVE
Glucose, UA: NEGATIVE mg/dL
Ketones, POC UA: NEGATIVE mg/dL
Nitrite, UA: NEGATIVE
POC PROTEIN,UA: NEGATIVE
Spec Grav, UA: 1.02 (ref 1.010–1.025)
Urobilinogen, UA: 0.2 E.U./dL
pH, UA: 6 (ref 5.0–8.0)

## 2020-07-23 LAB — POCT URINE PREGNANCY: Preg Test, Ur: NEGATIVE

## 2020-07-23 MED ORDER — NITROFURANTOIN MONOHYD MACRO 100 MG PO CAPS: 100.0000 mg | ORAL_CAPSULE | Freq: Two times a day (BID) | ORAL | 0 refills | Status: DC

## 2020-07-23 MED ORDER — POLYETHYLENE GLYCOL 3350 17 GM/SCOOP PO POWD: 17.0000 g | Freq: Two times a day (BID) | ORAL | 1 refills | Status: DC | PRN

## 2020-07-23 MED ORDER — FLUCONAZOLE 150 MG PO TABS: 150.0000 mg | ORAL_TABLET | Freq: Once | ORAL | 0 refills | Status: AC

## 2020-07-23 MED ORDER — PROMETHAZINE HCL 12.5 MG PO TABS: 12.5000 mg | ORAL_TABLET | Freq: Three times a day (TID) | ORAL | 0 refills | Status: DC | PRN

## 2020-07-23 NOTE — Progress Notes (Signed)
Patient ID: Natalie Thornton, female   DOB: 09/17/1985, 35 y.o.   MRN: [id number]   Natalie Thornton, is a 35 y.o. female  LYY:[id number]  LEX:[id number]  DOB - 1985/10/17  Subjective:  Chief Complaint and HPI: Natalie Thornton is a 35 y.o. female here today with multiple issues. 1. Stomach pain that has been present on and off since end of December.  She was found to have UTI w/o UTI s/sx with this pain and was prescribed septra but threw it up and could not complete course. 2-today she still has the same lower abdominal pain.  +SA with SSP.  Denies urinary s/sx.  No fever.  No vaginal discharge.  +nausea.  No vomiting.  Appetite ok.   3-HA on and off for 3-4 days.  usu relieved bu=y OTC meds at home.  No vision changes.  No FH of aneurysm.  HA has come and gone.  NO HA right now.   4-also having intermittent dizziness.  Occurs usu when she stands up too fast.   5-constipation long-term and ongoing.    ROS:   Constitutional:  No f/c, No night sweats, No unexplained weight loss. EENT:  No vision changes, No blurry vision, No hearing changes. No mouth, throat, or ear problems.  Respiratory: No cough, No SOB Cardiac: No CP, no palpitations GI:  See above.  No diarrhea GU: No Urinary s/sx Musculoskeletal: No joint pain Neuro: + headache, + mild and intermittent dizziness, no motor weakness.  Skin: No rash Endocrine:  No polydipsia. No polyuria.  Psych: Denies SI/HI  No problems updated.  ALLERGIES: No Known Allergies  PAST MEDICAL HISTORY: Past Medical History:  Diagnosis Date  . Anxiety   . Arthritis   . Chronic back pain   . DDD (degenerative disc disease)   . Degeneration of intervertebral disc of lumbar region 07/30/2019  . Degenerative disc disease, cervical   . Disc disease, degenerative, cervical   . H pylori ulcer   . H/O degenerative disc disease 2013  . History of hiatal hernia   . Low back pain   . Numbness    Whole back   . Pilonidal cyst     MEDICATIONS AT  HOME: Prior to Admission medications   Medication Sig Start Date End Date Taking? Authorizing Provider  fluconazole (DIFLUCAN) 150 MG tablet Take 1 tablet (150 mg total) by mouth once for 1 dose. If needed after antibiotics for yeast 07/23/20 07/23/20 Yes Rhoderick Farrel, Dionne Bucy, PA-C  nitrofurantoin, macrocrystal-monohydrate, (MACROBID) 100 MG capsule Take 1 capsule (100 mg total) by mouth 2 (two) times daily. 07/23/20  Yes Freeman Caldron M, PA-C  polyethylene glycol powder (GLYCOLAX/MIRALAX) 17 GM/SCOOP powder Take 17 g by mouth 2 (two) times daily as needed. 07/23/20  Yes Yolani Vo, Dionne Bucy, PA-C  promethazine (PHENERGAN) 12.5 MG tablet Take 1 tablet (12.5 mg total) by mouth every 8 (eight) hours as needed for nausea or vomiting. 07/23/20  Yes Argentina Donovan, PA-C  Mouthwash Compounding Base LIQD Swish and spit 15 mLs as needed. Formulation: 1.  Benadryl 12.5 mg/ML-QS 2 to 40 mL 2.  Hydrocortisone suspension 60 mg 3.  Nystatin 100,000 units/ml- 53ml suspension 05/04/20   Lamptey, Myrene Galas, MD  albuterol (VENTOLIN HFA) 108 (90 Base) MCG/ACT inhaler Inhale 2 puffs into the lungs every 2 (two) hours as needed for wheezing or shortness of breath (cough). Patient not taking: Reported on 06/13/2019 03/08/19 06/15/19  Muthersbaugh, Jarrett Soho, PA-C  omeprazole (PRILOSEC) 20 MG capsule Take 1 capsule (20 mg  total) by mouth daily. Patient not taking: Reported on 11/27/2019 10/30/19 12/03/19  Argentina Donovan, PA-C  sucralfate (CARAFATE) 1 GM/10ML suspension Take 10 mLs (1 g total) by mouth 4 (four) times daily -  with meals and at bedtime. Patient not taking: Reported on 06/13/2019 06/09/19 06/15/19  Jaynee Eagles, PA-C     Objective:  EXAM:   Vitals:   07/23/20 0838  BP: 113/74  Pulse: 73  SpO2: 98%  Weight: 142 lb (64.4 kg)    General appearance : A&OX3. NAD. Non-toxic-appearing HEENT: Atraumatic and Normocephalic.  PERRLA. EOM intact. Neck: supple, no JVD. No cervical lymphadenopathy. No thyromegaly Chest/Lungs:   Breathing-non-labored, Good air entry bilaterally, breath sounds normal without rales, rhonchi, or wheezing  CVS: S1 S2 regular, no murmurs, gallops, rubs  Abdomen: Bowel sounds present, Non tender and not distended with no gaurding, rigidity or rebound. Extremities: Bilateral Lower Ext shows no edema, both legs are warm to touch with = pulse throughout Neurology:  CN II-XII grossly intact, Non focal.   Psych:  TP linear. J/I WNL. Normal speech. Appropriate eye contact and affect.  Skin:  No Rash  Data Review No results found for: HGBA1C   Assessment & Plan   1. Stomach pain No red flags - Comprehensive metabolic panel - TSH - CBC with Differential/Platelet - Vitamin D, 25-hydroxy - POCT URINALYSIS DIP (CLINITEK) - POCT urine pregnancy - Cervicovaginal ancillary only - H. pylori breath test - Urine Culture - promethazine (PHENERGAN) 12.5 MG tablet; Take 1 tablet (12.5 mg total) by mouth every 8 (eight) hours as needed for nausea or vomiting.  Dispense: 20 tablet; Refill: 0  2. Nonintractable headache, unspecified chronicity pattern, unspecified headache type Believe most symptoms are multifactorial and esp related to dehydration and UTI.  No red flags on exam  3. Dizziness Believe most symptoms are multifactorial and esp related to dehydration and UTI.  No red flags on exam - TSH - CBC with Differential/Platelet - POCT urine pregnancy - Cervicovaginal ancillary only  4. Acute cystitis without hematuria Increase water intake to 80-100 ounces daily.   - nitrofurantoin, macrocrystal-monohydrate, (MACROBID) 100 MG capsule; Take 1 capsule (100 mg total) by mouth 2 (two) times daily.  Dispense: 14 capsule; Refill: 0 - fluconazole (DIFLUCAN) 150 MG tablet; Take 1 tablet (150 mg total) by mouth once for 1 dose. If needed after antibiotics for yeast  Dispense: 1 tablet; Refill: 0 - Urine Culture  - Cervicovaginal ancillary only  5. Nausea Believe most symptoms are multifactorial  and esp related to dehydration and UTI.  No red flags on exam - promethazine (PHENERGAN) 12.5 MG tablet; Take 1 tablet (12.5 mg total) by mouth every 8 (eight) hours as needed for nausea or vomiting.  Dispense: 20 tablet; Refill: 0  6. Constipation, unspecified constipation type Believe most symptoms are multifactorial and esp related to dehydration and UTI.  No red flags on exam - polyethylene glycol powder (GLYCOLAX/MIRALAX) 17 GM/SCOOP powder; Take 17 g by mouth 2 (two) times daily as needed.  Dispense: 3350 g; Refill: 1   Patient have been counseled extensively about nutrition and exercise  Return in about 2 months (around 09/20/2020) for with PCP for chronic conditions.  The patient was given clear instructions to go to ER or return to medical center if symptoms don't improve, worsen or new problems develop. The patient verbalized understanding. The patient was told to call to get lab results if they haven't heard anything in the next week.  Freeman Caldron, PA-C [facility] [place], [place]   07/23/2020, 9:05 AM

## 2020-07-23 NOTE — Patient Instructions (Signed)
Drink 80-100 ounces water daily   Constipation, Adult Constipation is when a person has trouble pooping (having a bowel movement). When you have this condition, you may poop fewer than 3 times a week. Your poop (stool) may also be dry, hard, or bigger than normal. Follow these instructions at home: Eating and drinking  Eat foods that have a lot of fiber, such as: ? Fresh fruits and vegetables. ? Whole grains. ? Beans.  Eat less of foods that are low in fiber and high in fat and sugar, such as: ? [place] fries. ? Hamburgers. ? Cookies. ? Candy. ? Soda.  Drink enough fluid to keep your pee (urine) pale yellow.   General instructions  Exercise regularly or as told by your doctor. Try to do 150 minutes of exercise each week.  Go to the restroom when you feel like you need to poop. Do not hold it in.  Take over-the-counter and prescription medicines only as told by your doctor. These include any fiber supplements.  When you poop: ? Do deep breathing while relaxing your lower belly (abdomen). ? Relax your pelvic floor. The pelvic floor is a group of muscles that support the rectum, bladder, and intestines (as well as the uterus in women).  Watch your condition for any changes. Tell your doctor if you notice any.  Keep all follow-up visits as told by your doctor. This is important. Contact a doctor if:  You have pain that gets worse.  You have a fever.  You have not pooped for 4 days.  You vomit.  You are not hungry.  You lose weight.  You are bleeding from the opening of the butt (anus).  You have thin, pencil-like poop. Get help right away if:  You have a fever, and your symptoms suddenly get worse.  You leak poop or have blood in your poop.  Your belly feels hard or bigger than normal (bloated).  You have very bad belly pain.  You feel dizzy or you faint. Summary  Constipation is when a person poops fewer than 3 times a week, has trouble pooping, or has  poop that is dry, hard, or bigger than normal.  Eat foods that have a lot of fiber.  Drink enough fluid to keep your pee (urine) pale yellow.  Take over-the-counter and prescription medicines only as told by your doctor. These include any fiber supplements. This information is not intended to replace advice given to you by your health care provider. Make sure you discuss any questions you have with your health care provider. Document Revised: 04/10/2019 Document Reviewed: 04/10/2019 Elsevier Patient Education  2021 [place].    Dizziness Dizziness is a common problem. It makes you feel unsteady or light-headed. You may feel like you are about to pass out (faint). Dizziness can lead to getting hurt if you stumble or fall. Dizziness can be caused by many things, including:  Medicines.  Not having enough water in your body (dehydration).  Illness. Follow these instructions at home: Eating and drinking  Drink enough fluid to keep your pee (urine) clear or pale yellow. This helps to keep you from getting dehydrated. Try to drink more clear fluids, such as water.  Do not drink alcohol.  Limit how much caffeine you drink or eat, if your doctor tells you to do that.  Limit how much salt (sodium) you drink or eat, if your doctor tells you to do that.   Activity  Avoid making quick movements. ? When you  stand up from sitting in a chair, steady yourself until you feel okay. ? In the morning, first sit up on the side of the bed. When you feel okay, stand slowly while you hold onto something. Do this until you know that your balance is fine.  If you need to stand in one place for a long time, move your legs often. Tighten and relax the muscles in your legs while you are standing.  Do not drive or use heavy machinery if you feel dizzy.  Avoid bending down if you feel dizzy. Place items in your home so you can reach them easily without leaning over.   Lifestyle  Do not use any  products that contain nicotine or tobacco, such as cigarettes and e-cigarettes. If you need help quitting, ask your doctor.  Try to lower your stress level. You can do this by using methods such as yoga or meditation. Talk with your doctor if you need help. General instructions  Watch your dizziness for any changes.  Take over-the-counter and prescription medicines only as told by your doctor. Talk with your doctor if you think that you are dizzy because of a medicine that you are taking.  Tell a friend or a family member that you are feeling dizzy. If he or she notices any changes in your behavior, have this person call your doctor.  Keep all follow-up visits as told by your doctor. This is important. Contact a doctor if:  Your dizziness does not go away.  Your dizziness or light-headedness gets worse.  You feel sick to your stomach (nauseous).  You have trouble hearing.  You have new symptoms.  You are unsteady on your feet.  You feel like the room is spinning. Get help right away if:  You throw up (vomit) or have watery poop (diarrhea), and you cannot eat or drink anything.  You have trouble: ? Talking. ? Walking. ? Swallowing. ? Using your arms, hands, or legs.  You feel generally weak.  You are not thinking clearly, or you have trouble forming sentences. A friend or family member may notice this.  You have: ? Chest pain. ? Pain in your belly (abdomen). ? Shortness of breath. ? Sweating.  Your vision changes.  You are bleeding.  You have a very bad headache.  You have neck pain or a stiff neck.  You have a fever. These symptoms may be an emergency. Do not wait to see if the symptoms will go away. Get medical help right away. Call your local emergency services (911 in the [place] Do not drive yourself to the hospital. Summary  Dizziness makes you feel unsteady or light-headed. You may feel like you are about to pass out (faint).  Drink enough fluid to  keep your pee (urine) clear or pale yellow. Do not drink alcohol.  Avoid making quick movements if you feel dizzy.  Watch your dizziness for any changes. This information is not intended to replace advice given to you by your health care provider. Make sure you discuss any questions you have with your health care provider. Document Revised: 02/12/2020 Document Reviewed: 06/09/2016 Elsevier Patient Education  2021 [place]. Urinary Tract Infection, Adult A urinary tract infection (UTI) is an infection of any part of the urinary tract. The urinary tract includes:  The kidneys.  The ureters.  The bladder.  The urethra. These organs make, store, and get rid of pee (urine) in the body. What are the causes? This infection is caused by  germs (bacteria) in your genital area. These germs grow and cause swelling (inflammation) of your urinary tract. What increases the risk? The following factors may make you more likely to develop this condition:  Using a small, thin tube (catheter) to drain pee.  Not being able to control when you pee or poop (incontinence).  Being female. If you are female, these things can increase the risk: ? Using these methods to prevent pregnancy:  A medicine that kills sperm (spermicide).  A device that blocks sperm (diaphragm). ? Having low levels of a female hormone (estrogen). ? Being pregnant. You are more likely to develop this condition if:  You have genes that add to your risk.  You are sexually active.  You take antibiotic medicines.  You have trouble peeing because of: ? A prostate that is bigger than normal, if you are female. ? A blockage in the part of your body that drains pee from the bladder. ? A kidney stone. ? A nerve condition that affects your bladder. ? Not getting enough to drink. ? Not peeing often enough.  You have other conditions, such as: ? Diabetes. ? A weak disease-fighting system (immune system). ? Sickle cell  disease. ? Gout. ? Injury of the spine. What are the signs or symptoms? Symptoms of this condition include:  Needing to pee right away.  Peeing small amounts often.  Pain or burning when peeing.  Blood in the pee.  Pee that smells bad or not like normal.  Trouble peeing.  Pee that is cloudy.  Fluid coming from the vagina, if you are female.  Pain in the belly or lower back. Other symptoms include:  Vomiting.  Not feeling hungry.  Feeling mixed up (confused). This may be the first symptom in older adults.  Being tired and grouchy (irritable).  A fever.  Watery poop (diarrhea). How is this treated?  Taking antibiotic medicine.  Taking other medicines.  Drinking enough water. In some cases, you may need to see a specialist. Follow these instructions at home: Medicines  Take over-the-counter and prescription medicines only as told by your doctor.  If you were prescribed an antibiotic medicine, take it as told by your doctor. Do not stop taking it even if you start to feel better. General instructions  Make sure you: ? Pee until your bladder is empty. ? Do not hold pee for a long time. ? Empty your bladder after sex. ? Wipe from front to back after peeing or pooping if you are a female. Use each tissue one time when you wipe.  Drink enough fluid to keep your pee pale yellow.  Keep all follow-up visits.   Contact a doctor if:  You do not get better after 1-2 days.  Your symptoms go away and then come back. Get help right away if:  You have very bad back pain.  You have very bad pain in your lower belly.  You have a fever.  You have chills.  You feeling like you will vomit or you vomit. Summary  A urinary tract infection (UTI) is an infection of any part of the urinary tract.  This condition is caused by germs in your genital area.  There are many risk factors for a UTI.  Treatment includes antibiotic medicines.  Drink enough fluid to  keep your pee pale yellow. This information is not intended to replace advice given to you by your health care provider. Make sure you discuss any questions you have with your health  care provider. Document Revised: 01/03/2020 Document Reviewed: 01/03/2020 Elsevier Patient Education  [place].

## 2020-07-24 LAB — COMPREHENSIVE METABOLIC PANEL
ALT: 6 IU/L (ref 0–32)
AST: 14 IU/L (ref 0–40)
Albumin/Globulin Ratio: 1.5 (ref 1.2–2.2)
Albumin: 4.2 g/dL (ref 3.8–4.8)
Alkaline Phosphatase: 37 IU/L — ABNORMAL LOW (ref 44–121)
BUN/Creatinine Ratio: 6 — ABNORMAL LOW (ref 9–23)
BUN: 6 mg/dL (ref 6–20)
Bilirubin Total: 0.3 mg/dL (ref 0.0–1.2)
CO2: 23 mmol/L (ref 20–29)
Calcium: 9.6 mg/dL (ref 8.7–10.2)
Chloride: 103 mmol/L (ref 96–106)
Creatinine, Ser: 0.96 mg/dL (ref 0.57–1.00)
GFR calc Af Amer: 90 mL/min/{1.73_m2} (ref >59)
GFR calc non Af Amer: 79 mL/min/{1.73_m2} (ref >59)
Globulin, Total: 2.8 g/dL (ref 1.5–4.5)
Glucose: 83 mg/dL (ref 65–99)
Potassium: 4.7 mmol/L (ref 3.5–5.2)
Sodium: 141 mmol/L (ref 134–144)
Total Protein: 7 g/dL (ref 6.0–8.5)

## 2020-07-24 LAB — CBC WITH DIFFERENTIAL/PLATELET
Basophils Absolute: 0 10*3/uL (ref 0.0–0.2)
Basos: 1 %
EOS (ABSOLUTE): 0.1 10*3/uL (ref 0.0–0.4)
Eos: 3 %
Hematocrit: 41.7 % (ref 34.0–46.6)
Hemoglobin: 13.6 g/dL (ref 11.1–15.9)
Immature Grans (Abs): 0 10*3/uL (ref 0.0–0.1)
Immature Granulocytes: 0 %
Lymphocytes Absolute: 1.4 10*3/uL (ref 0.7–3.1)
Lymphs: 39 %
MCH: 29.8 pg (ref 26.6–33.0)
MCHC: 32.6 g/dL (ref 31.5–35.7)
MCV: 91 fL (ref 79–97)
Monocytes Absolute: 0.3 10*3/uL (ref 0.1–0.9)
Monocytes: 10 %
Neutrophils Absolute: 1.7 10*3/uL (ref 1.4–7.0)
Neutrophils: 47 %
Platelets: 229 10*3/uL (ref 150–450)
RBC: 4.56 x10E6/uL (ref 3.77–5.28)
RDW: 12.2 % (ref 11.7–15.4)
WBC: 3.5 10*3/uL (ref 3.4–10.8)

## 2020-07-24 LAB — CERVICOVAGINAL ANCILLARY ONLY
Bacterial Vaginitis (gardnerella): NEGATIVE
Candida Glabrata: NEGATIVE
Candida Vaginitis: NEGATIVE
Chlamydia: NEGATIVE
Comment: NEGATIVE
Comment: NEGATIVE
Comment: NEGATIVE
Comment: NEGATIVE
Comment: NEGATIVE
Comment: NORMAL
Neisseria Gonorrhea: NEGATIVE
Trichomonas: NEGATIVE

## 2020-07-24 LAB — H. PYLORI BREATH TEST: H pylori Breath Test: NEGATIVE

## 2020-07-24 LAB — TSH: TSH: 0.63 u[IU]/mL (ref 0.450–4.500)

## 2020-07-24 LAB — VITAMIN D 25 HYDROXY (VIT D DEFICIENCY, FRACTURES): Vit D, 25-Hydroxy: 17.5 ng/mL — ABNORMAL LOW (ref 30.0–100.0)

## 2020-07-25 LAB — URINE CULTURE

## 2020-07-29 ENCOUNTER — Other Ambulatory Visit: Payer: Self-pay | Admitting: Physician Assistant

## 2020-07-29 MED ORDER — VITAMIN D (ERGOCALCIFEROL) 1.25 MG (50000 UNIT) PO CAPS: 50000.0000 [IU] | ORAL_CAPSULE | ORAL | 0 refills | Status: DC

## 2020-08-04 ENCOUNTER — Ambulatory Visit: Payer: 59

## 2020-08-11 ENCOUNTER — Ambulatory Visit: Payer: 59

## 2020-08-12 ENCOUNTER — Other Ambulatory Visit: Payer: Self-pay

## 2020-08-12 ENCOUNTER — Ambulatory Visit: Payer: Self-pay | Attending: Internal Medicine

## 2020-08-12 DIAGNOSIS — Z111 Encounter for screening for respiratory tuberculosis: Secondary | ICD-10-CM

## 2020-08-12 NOTE — Progress Notes (Signed)
Tuberculin skin test applied to the right ventral forearm.  Patient informed to schedule appt for nurse visit in 48-72 hours to have site read.  Jackelyn Knife, RMA

## 2020-08-14 ENCOUNTER — Ambulatory Visit: Payer: Self-pay | Attending: Internal Medicine

## 2020-08-14 ENCOUNTER — Other Ambulatory Visit: Payer: Self-pay

## 2020-08-14 ENCOUNTER — Other Ambulatory Visit: Payer: Self-pay | Admitting: Pharmacy Technician

## 2020-08-14 DIAGNOSIS — Z111 Encounter for screening for respiratory tuberculosis: Secondary | ICD-10-CM

## 2020-08-14 LAB — TB SKIN TEST
Induration: 0 mm
TB Skin Test: NEGATIVE

## 2020-08-14 MED FILL — VIT D2 1.25 MG (50,000 UNIT: 1.25 MG | 28 days supply | Qty: 4 | Fill #0

## 2020-08-14 MED FILL — PROMETHAZINE 12.5 MG TABLET: 12.5 | 6 days supply | Qty: 20 | Fill #0

## 2020-08-14 NOTE — Progress Notes (Signed)
Patient here today to have PPD site read.   PPD read and results entered in EpicCare. Result: 0 mm induration. Interpretation: Negative Jay'A R Sanjiv Castorena, RMA   

## 2020-08-17 ENCOUNTER — Ambulatory Visit: Payer: Self-pay | Admitting: *Deleted

## 2020-08-17 NOTE — Telephone Encounter (Signed)
C/o right upper arm pain and swelling. 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 Patient denies chest pain, difficulty breathing , weakness in right arm. Reports pain and swelling in right arm started after working out in gym and now arm painful to swollen area located on bicep area. Using right arm causes pain and pressure. Pain comes and goes depending on how much arm is used. Denies fever, swelling in other areas of arm. Swelling noted as size of a golf ball. 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 appt scheduled for 08/19/20. Care advise given. Patient verbalized understanding of care advise and to call back or go to [facility] or ED if symptoms worsen.   Reason for Disposition . [1] Arm pains with exertion (e.g., walking) AND [2] pain goes away on resting AND [3] not present now  Answer Assessment - Initial Assessment Questions 1. ONSET: "When did the pain start?"     After working out in gym 2. LOCATION: "Where is the pain located?"     Right upper arm in Deltoid area 3. PAIN: "How bad is the pain?" (Scale 1-10; or mild, moderate, severe)   - MILD (1-3): doesn't interfere with normal activities   - MODERATE (4-7): interferes with normal activities (e.g., work or school) or awakens from sleep   - SEVERE (8-10): excruciating pain, unable to do any normal activities, unable to hold a cup of water     Moderate  4. WORK OR EXERCISE: "Has there been any recent work or exercise that involved this part of the body?"     Pain noted after working out in gym 5. CAUSE: "What do you think is causing the arm pain?"     lypoma 6. OTHER SYMPTOMS: "Do you have any other symptoms?" (e.g., neck pain, swelling, rash, fever, numbness, weakness)     Swelling in right upper arm, painful to touch 7. PREGNANCY: "Is there any chance you are pregnant?" "When was your last menstrual period?"     na  Protocols used: ARM PAIN-A-AH

## 2020-08-17 NOTE — Telephone Encounter (Signed)
Will forward to [place]. Pt has a virtual appt 3/16

## 2020-08-19 ENCOUNTER — Other Ambulatory Visit: Payer: Self-pay

## 2020-08-19 ENCOUNTER — Encounter: Payer: Self-pay | Admitting: Physician Assistant

## 2020-08-19 ENCOUNTER — Ambulatory Visit: Payer: Self-pay | Attending: Physician Assistant | Admitting: Physician Assistant

## 2020-08-19 DIAGNOSIS — D1721 Benign lipomatous neoplasm of skin and subcutaneous tissue of right arm: Secondary | ICD-10-CM

## 2020-08-19 DIAGNOSIS — N644 Mastodynia: Secondary | ICD-10-CM

## 2020-08-19 NOTE — Progress Notes (Signed)
Patient ID: Natalie Thornton, female   DOB: 12-22-1985, 35 y.o.   MRN: [id number] Virtual Visit via Telephone Note  I connected with Natalie Thornton on 08/19/20 at  9:50 AM EDT by telephone and verified that I am speaking with the correct person using two identifiers.  Location: Patient: home Provider: [facility] office   I discussed the limitations, risks, security and privacy concerns of performing an evaluation and management service by telephone and the availability of in person appointments. I also discussed with the patient that there may be a patient responsible charge related to this service. The patient expressed understanding and agreed to proceed.   History of Present Illness: Patient is c/o R arm pain 2 days after going to the gym.  Was told at [facility] and was told she had a lipoma about a year ago.  Seems to be bumping into things more and it is bothering her more  Pain L breast at times.  Sometimes the area appears inflamed and fluid comes out of it.  Not happening currently.   No FH early breast CA.       Observations/Objective: NAD.  A&Ox3  Assessment and Plan:   1. Lipoma of right upper extremity - Ambulatory referral to General Surgery  2. Breast pain, left - US BREAST LTD UNI LEFT INC AXILLA; Future -SBE encourage monthly after periods.    Follow Up Instructions: Keep 4/26 appt with Dr Wynetta Emery   I discussed the assessment and treatment plan with the patient. The patient was provided an opportunity to ask questions and all were answered. The patient agreed with the plan and demonstrated an understanding of the instructions.   The patient was advised to call back or seek an in-person evaluation if the symptoms worsen or if the condition fails to improve as anticipated.  I provided 14 minutes of non-face-to-face time during this encounter.   Freeman Caldron, PA-C

## 2020-08-20 ENCOUNTER — Other Ambulatory Visit: Payer: Self-pay | Admitting: Physician Assistant

## 2020-08-26 ENCOUNTER — Telehealth: Payer: Self-pay | Admitting: Internal Medicine

## 2020-08-26 ENCOUNTER — Other Ambulatory Visit: Payer: Self-pay | Admitting: Physician Assistant

## 2020-08-26 DIAGNOSIS — N644 Mastodynia: Secondary | ICD-10-CM

## 2020-08-26 NOTE — Telephone Encounter (Signed)
Reached out to pt and made aware that she contact the [facility] center to see if appt can be moved up. Pt states wasn't calling to see if appt can be moved up pt states she wanted to know if there was somewhere she can go to do the ultrasound made pt aware that she can contact [facility] to see when they can get here in for ultrasound. Pt states she understands and doesn't have any questions or concerns. Provided pt the number for the [place] [phone]

## 2020-08-26 NOTE — Telephone Encounter (Signed)
Pt is asking if the Ultrasound Appt for 10/07/20 can be moved up earlier due to the discomfort she's having. Please advise and thank you

## 2020-09-09 ENCOUNTER — Ambulatory Visit: Payer: Self-pay | Admitting: *Deleted

## 2020-09-09 NOTE — Telephone Encounter (Signed)
  Patient is calling to report she is having midline abdominal pain- burning pain that comes and goes- lasting seconds since Monday. Patient vomited Monday PM- no vomiting since- but the burning pain started after that. Patient has not used any OTC medications for symptoms.  Reason for Disposition . [1] MODERATE pain (e.g., interferes with normal activities) AND [2] comes and goes (cramps) AND [3] present > 24 hours  (Exception: pain with Vomiting or Diarrhea - see that Guideline)  Answer Assessment - Initial Assessment Questions 1. LOCATION: "Where does it hurt?"      Upper mid line 2. RADIATION: "Does the pain shoot anywhere else?" (e.g., chest, back)     Started in lower abdomen- 2 nights ago- had vomiting, burning sensation present now 3. ONSET: "When did the pain begin?" (e.g., minutes, hours or days ago)      2 days ago- comes and goes 4. SUDDEN: "Gradual or sudden onset?"     gradual 5. PATTERN "Does the pain come and go, or is it constant?"    - If constant: "Is it getting better, staying the same, or worsening?"      (Note: Constant means the pain never goes away completely; most serious pain is constant and it progresses)     - If intermittent: "How long does it last?" "Do you have pain now?"     (Note: Intermittent means the pain goes away completely between bouts)     Comes and goes- lasts throughout the day 6. SEVERITY: "How bad is the pain?"  (e.g., Scale 1-10; mild, moderate, or severe)   - MILD (1-3): doesn't interfere with normal activities, abdomen soft and not tender to touch    - MODERATE (4-7): interferes with normal activities or awakens from sleep, tender to touch    - SEVERE (8-10): excruciating pain, doubled over, unable to do any normal activities      severe 7. RECURRENT SYMPTOM: "Have you ever had this type of stomach pain before?" If Yes, ask: "When was the last time?" and "What happened that time?"      no 8. CAUSE: "What do you think is causing the stomach  pain?"     unsure 9. RELIEVING/AGGRAVATING FACTORS: "What makes it better or worse?" (e.g., movement, antacids, bowel movement)     nothing 10. OTHER SYMPTOMS: "Has there been any vomiting, diarrhea, constipation, or urine problems?"       Vomiting- started 2 nights ago- 1 episode 11. PREGNANCY: "Is there any chance you are pregnant?" "When was your last menstrual period?"       No- LMP-due soon- beginning of March  Protocols used: ABDOMINAL PAIN - UPPER-A-AH, ABDOMINAL PAIN - FEMALE-A-AH

## 2020-09-10 ENCOUNTER — Encounter: Payer: Self-pay | Admitting: Critical Care Medicine

## 2020-09-10 ENCOUNTER — Other Ambulatory Visit: Payer: Self-pay

## 2020-09-10 ENCOUNTER — Ambulatory Visit: Payer: Self-pay | Attending: Critical Care Medicine | Admitting: Critical Care Medicine

## 2020-09-10 DIAGNOSIS — R1013 Epigastric pain: Secondary | ICD-10-CM

## 2020-09-10 MED ORDER — PANTOPRAZOLE SODIUM 40 MG PO TBEC: 40.0000 mg | DELAYED_RELEASE_TABLET | Freq: Two times a day (BID) | ORAL | 3 refills | Status: DC

## 2020-09-10 NOTE — Assessment & Plan Note (Signed)
Chronic recurrent epigastric pain which sounds like gastritis with associated reflux  Plan for this patient is to begin Protonix 40 mg twice daily before meals  Plan also be for this patient to be referred back to gastroenterology for further evaluation  Patient advised to follow-up with primary care as well

## 2020-09-10 NOTE — Progress Notes (Signed)
Subjective:    Patient ID: Natalie Thornton, female    DOB: 1986-01-09, 35 y.o.   MRN: [id number] Virtual Visit via Telephone Note  I connected with Natalie Thornton on 09/10/20 at  9:00 AM EDT by telephone and verified that I am speaking with the correct person using two identifiers.   Consent:  I discussed the limitations, risks, security and privacy concerns of performing an evaluation and management service by telephone and the availability of in person appointments. I also discussed with the patient that there may be a patient responsible charge related to this service. The patient expressed understanding and agreed to proceed.  Location of patient: Patient's at home  Location of provider: I am in my office  Persons participating in the televisit with the patient.   No one else on the call  Note I tried to get a video established for this patient but she did not have the proper technology and she preferred a telephone call    History of Present Illness: 35 y.o.M  PCP Wynetta Emery  09/10/2020 This is a former primary care patient of Dr. Wynetta Emery who has had prior history for about 2 years of intermittent upper abdominal pain she appeared to improve somewhat but over the last week she is worsened and it is in the upper epigastric area and left upper quadrant areas of burning sensation she feels as though her stomach is empty and she has bloating.  She is tends to stay constipated.  She does drink 4 shots of alcohol weekends socially she also smokes 4 to 5 cigarettes daily.  She has had a history of gastritis in the past but last upper endoscopy done the gastroenterology in March 2021 showed no ulcer or esophagitis or gastritis.  She recently was seen by one of our other providers and a H. pylori breath test was ordered and was negative.  The chart shows there is a potential past medical history of H. pylori gastritis in the past  Patient notes some nausea but no vomiting.  There is no blood  in the stool noted she is not got hematemesis.  She denies belching burping or reflux symptoms into the throat.  This is a work in visit to evaluate the abdominal pain.   Past Medical History:  Diagnosis Date  . Anxiety   . Arthritis   . Chronic back pain   . DDD (degenerative disc disease)   . Degeneration of intervertebral disc of lumbar region 07/30/2019  . Degenerative disc disease, cervical   . Disc disease, degenerative, cervical   . H pylori ulcer   . H/O degenerative disc disease 2013  . History of hiatal hernia   . Low back pain   . Numbness    Whole back   . Pilonidal cyst      Family History  Problem Relation Age of Onset  . Healthy Mother   . Healthy Father   . AAA (abdominal aortic aneurysm) Maternal Grandmother   . Leukemia Maternal Grandmother   . Breast cancer Maternal Grandmother   . Head & neck cancer Paternal Grandmother        Back cancer  . Lymphoma Paternal Grandmother   . Arthritis Paternal Grandmother   . Cirrhosis Maternal Uncle   . Breast cancer Other   . Arthritis Paternal Grandfather   . Colon cancer Neg Hx   . Esophageal cancer Neg Hx   . Rectal cancer Neg Hx   . Stomach cancer Neg Hx  Social History   Socioeconomic History  . Marital status: Single    Spouse name: Not on file  . Number of children: 2  . Years of education: Not on file  . Highest education level: Not on file  Occupational History  . Occupation: [employer]   Tobacco Use  . Smoking status: Current Some Day Smoker    Years: 10.00    Types: Cigars  . Smokeless tobacco: Never Used  . Tobacco comment: BLACK AND MILDS. 2 pks per week  Vaping Use  . Vaping Use: Never used  Substance and Sexual Activity  . Alcohol use: Yes    Comment: occasionally  . Drug use: No  . Sexual activity: Not on file    Comment: oral intercourse only  Other Topics Concern  . Not on file  Social History Narrative  . Not on file   Social Determinants of Health   Financial  Resource Strain: Not on file  Food Insecurity: Not on file  Transportation Needs: Not on file  Physical Activity: Not on file  Stress: Not on file  Social Connections: Not on file  Intimate Partner Violence: Not on file     No Known Allergies   Outpatient Medications Prior to Visit  Medication Sig Dispense Refill  . nitrofurantoin, macrocrystal-monohydrate, (MACROBID) 100 MG capsule Take 1 capsule (100 mg total) by mouth 2 (two) times daily. (Patient not taking: Reported on 09/10/2020) 14 capsule 0  . polyethylene glycol powder (GLYCOLAX/MIRALAX) 17 GM/SCOOP powder Take 17 g by mouth 2 (two) times daily as needed. (Patient not taking: Reported on 09/10/2020) 3350 g 1  . promethazine (PHENERGAN) 12.5 MG tablet Take 1 tablet (12.5 mg total) by mouth every 8 (eight) hours as needed for nausea or vomiting. (Patient not taking: Reported on 09/10/2020) 20 tablet 0  . promethazine (PHENERGAN) 12.5 MG tablet TAKE 1 TABLET BY MOUTH EVERY 8 HOURS AS NEEDED FOR NAUSEA OR VOMITING. (Patient not taking: Reported on 09/10/2020) 20 tablet 0  . Vitamin D, Ergocalciferol, (DRISDOL) 1.25 MG (50000 UNIT) CAPS capsule Take 1 capsule (50,000 Units total) by mouth every 7 (seven) days. (Patient not taking: Reported on 09/10/2020) 16 capsule 0  . Mouthwash Compounding Base LIQD Swish and spit 15 mLs as needed. Formulation: 1.  Benadryl 12.5 mg/ML-QS 2 to 40 mL 2.  Hydrocortisone suspension 60 mg 3.  Nystatin 100,000 units/ml- 75ml suspension 240 mL 0  . Vitamin D, Ergocalciferol, (DRISDOL) 1.25 MG (50000 UNIT) CAPS capsule TAKE 1 TABLET BY MOUTH EVERY 7 DAYS. 16 capsule 0   No facility-administered medications prior to visit.     Review of Systems  HENT: Negative.   Respiratory: Negative.   Cardiovascular: Negative.   Gastrointestinal: Positive for abdominal distention, abdominal pain, constipation, nausea and vomiting. Negative for anal bleeding, blood in stool and diarrhea.       GERD  Genitourinary: Negative.    Musculoskeletal: Positive for back pain.  Psychiatric/Behavioral: Negative.        Objective:   Physical Exam  No exam this is a telephone visit      Assessment & Plan:  I personally reviewed all images and lab data in the [facility] system as well as any outside material available during this office visit and agree with the  radiology impressions.   No problem-specific Assessment & Plan notes found for this encounter.   Diagnoses and all orders for this visit:  Epigastric pain -     Ambulatory referral to Gastroenterology  Other orders -  pantoprazole (PROTONIX) 40 MG tablet; Take 1 tablet (40 mg total) by mouth 2 (two) times daily before a meal.   I spent 25 minutes on this patient on the telephone as well as additional time afterwards reviewing records and prior examinations by gastroenterology  Follow Up Instructions: The patient knows to follow-up with her primary care provider and we will also refer her to gastroenterology for recurrent abdominal pain   I discussed the assessment and treatment plan with the patient. The patient was provided an opportunity to ask questions and all were answered. The patient agreed with the plan and demonstrated an understanding of the instructions.   The patient was advised to call back or seek an in-person evaluation if the symptoms worsen or if the condition fails to improve as anticipated.  I provided 25 minutes of non-face-to-face time during this encounter  including  median intraservice time , review of notes, labs, imaging, medications  and explaining diagnosis and management to the patient .    Asencion Noble, MD

## 2020-09-14 ENCOUNTER — Other Ambulatory Visit: Payer: Self-pay

## 2020-09-14 ENCOUNTER — Other Ambulatory Visit: Payer: Self-pay | Admitting: Physician Assistant

## 2020-09-15 MED ORDER — VITAMIN D (ERGOCALCIFEROL) 1.25 MG (50000 UNIT) PO CAPS: 50000.0000 [IU] | ORAL_CAPSULE | ORAL | 5 refills | Status: DC

## 2020-09-29 ENCOUNTER — Ambulatory Visit: Payer: 59 | Admitting: Internal Medicine

## 2020-10-01 ENCOUNTER — Encounter: Payer: Self-pay | Admitting: Nurse Practitioner

## 2020-10-01 ENCOUNTER — Ambulatory Visit (INDEPENDENT_AMBULATORY_CARE_PROVIDER_SITE_OTHER): Payer: Self-pay | Admitting: Nurse Practitioner

## 2020-10-01 ENCOUNTER — Other Ambulatory Visit (INDEPENDENT_AMBULATORY_CARE_PROVIDER_SITE_OTHER): Payer: Self-pay

## 2020-10-01 VITALS — BP 119/65 | HR 78 | Ht 59.0 in | Wt 139.2 lb

## 2020-10-01 DIAGNOSIS — R1013 Epigastric pain: Secondary | ICD-10-CM

## 2020-10-01 DIAGNOSIS — K769 Liver disease, unspecified: Secondary | ICD-10-CM

## 2020-10-01 DIAGNOSIS — R1084 Generalized abdominal pain: Secondary | ICD-10-CM

## 2020-10-01 LAB — COMPREHENSIVE METABOLIC PANEL
ALT: 9 U/L (ref 0–35)
AST: 15 U/L (ref 0–37)
Albumin: 4.3 g/dL (ref 3.5–5.2)
Alkaline Phosphatase: 37 U/L — ABNORMAL LOW (ref 39–117)
BUN: 11 mg/dL (ref 6–23)
CO2: 28 mEq/L (ref 19–32)
Calcium: 9.8 mg/dL (ref 8.4–10.5)
Chloride: 105 mEq/L (ref 96–112)
Creatinine, Ser: 1.06 mg/dL (ref 0.40–1.20)
GFR: 69.44 mL/min (ref >60.00)
Glucose, Bld: 92 mg/dL (ref 70–99)
Potassium: 3.5 mEq/L (ref 3.5–5.1)
Sodium: 139 mEq/L (ref 135–145)
Total Bilirubin: 0.2 mg/dL (ref 0.2–1.2)
Total Protein: 7 g/dL (ref 6.0–8.3)

## 2020-10-01 LAB — CBC WITH DIFFERENTIAL/PLATELET
Basophils Absolute: 0 10*3/uL (ref 0.0–0.1)
Basophils Relative: 0.7 % (ref 0.0–3.0)
Eosinophils Absolute: 0.1 10*3/uL (ref 0.0–0.7)
Eosinophils Relative: 1.5 % (ref 0.0–5.0)
HCT: 38.7 % (ref 36.0–46.0)
Hemoglobin: 12.8 g/dL (ref 12.0–15.0)
Lymphocytes Relative: 37.4 % (ref 12.0–46.0)
Lymphs Abs: 2.3 10*3/uL (ref 0.7–4.0)
MCHC: 33.1 g/dL (ref 30.0–36.0)
MCV: 91.4 fl (ref 78.0–100.0)
Monocytes Absolute: 0.4 10*3/uL (ref 0.1–1.0)
Monocytes Relative: 7 % (ref 3.0–12.0)
Neutro Abs: 3.2 10*3/uL (ref 1.4–7.7)
Neutrophils Relative %: 53.4 % (ref 43.0–77.0)
Platelets: 223 10*3/uL (ref 150.0–400.0)
RBC: 4.24 Mil/uL (ref 3.87–5.11)
RDW: 12.5 % (ref 11.5–15.5)
WBC: 6.1 10*3/uL (ref 4.0–10.5)

## 2020-10-01 LAB — VITAMIN D 25 HYDROXY (VIT D DEFICIENCY, FRACTURES): VITD: 25.11 ng/mL — ABNORMAL LOW (ref 30.00–100.00)

## 2020-10-01 MED ORDER — DICYCLOMINE HCL 10 MG PO CAPS
10.0000 mg | ORAL_CAPSULE | Freq: Three times a day (TID) | ORAL | 0 refills | Status: DC
Start: 2020-10-01 — End: 2021-12-29

## 2020-10-01 MED ORDER — FAMOTIDINE 20 MG PO TABS: 20.0000 mg | ORAL_TABLET | Freq: Every day | ORAL | 2 refills | Status: DC

## 2020-10-01 NOTE — Patient Instructions (Addendum)
Your provider has requested that you go to the basement level for lab work before leaving today. Press "B" on the elevator. The lab is located at the first door on the left as you exit the elevator.   Due to recent changes in healthcare laws, you may see the results of your imaging and laboratory studies on MyChart before your provider has had a chance to review them.  We understand that in some cases there may be results that are confusing or concerning to you. Not all laboratory results come back in the same time frame and the provider may be waiting for multiple results in order to interpret others.  Please give [place] 48 hours in order for your provider to thoroughly review all the results before contacting the office for clarification of your results.   We have sent the following medications to your pharmacy for you to pick up at your convenience: Famotidine, Dicyclomine   You have been scheduled for an abdominal ultrasound at [facility] (1st floor of hospital) on 10/09/20 at 10:30am. Please arrive 15 minutes prior to your appointment for registration. Make certain not to have anything to eat or drink 6 hours prior to your appointment. Should you need to reschedule your appointment, please contact radiology at [phone]. This test typically takes about 30 minutes to perform.   If you are age 33 or younger, your body mass index should be between 19-25. Your Body mass index is 28.11 kg/m. If this is out of the aformentioned range listed, please consider follow up with your Primary Care Provider.   Please keep follow-up in 6 weeks -11/05/20 @ 3:00pm    NO NSaids- including Excedrin   Thank you for choosing me and [facility].  Holland Patent

## 2020-10-01 NOTE — Progress Notes (Signed)
     10/01/2020 Natalie Thornton [id number] 04-07-1986   Chief Complaint:  Nausea, vomiting and upper abdominal pain   History of Present Illness: Natalie Thornton is 35 year old female with a past medical history of anxiety, arthritis, chronic back pain and + H. Pylori 05/2017 and chronic functional abdominal pain. She was last seen in office by Dr .Henrene Pastor on 07/10/2019. At that time, she was having intermittent abdominal pain with mild nausea without vomiting. An EGD was done on 07/16/2019 which was normal. She presents today with complaints of similar epigastric pain with nausea. She recently vomited up partially digested food x 1 about one week ago. She describes having epigastric dull discomfort, stomach feels empty with generalized abdominal cramping which occurs randomly. She is passing a normal formed brown BM daily. No rectal bleeding or black stools. She underwent a colonoscopy 08/12/2016 which was normal. Normal gastric empty study 06/29/2017. Normal [facility] HDIA 05/19/2017. 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 She is trying to lose weight by eating a healthier diet, she has lost 3lbs. She takes Excedrin HA 2 tabs po once weekly. No other NSAID use.  H. Pylori breath test 07/2020 was negative.   Review of Systems:   Constitutional: Negative for fever, sweats, chills or weight loss.  Respiratory: Negative for shortness of breath.   Cardiovascular: Negative for chest pain, palpitations and leg swelling.  Gastrointestinal: See HPI.  Musculoskeletal: Negative for back pain or muscle aches.  Neurological: Negative for dizziness, headaches or paresthesias.    Physical Exam: BP 119/65   Pulse 78   Ht 4\' 11"  (1.499 m)   Wt 139 lb 3.2 oz (63.1 kg)   SpO2 99%   BMI 28.11 kg/m  General: 35 year old female in no acute distress. Head: Normocephalic and atraumatic. Eyes: No scleral icterus. Conjunctiva pink  . Ears: Normal auditory acuity. Lungs: Clear throughout to auscultation. Heart: Regular rate and rhythm, no murmur. Abdomen: Soft, nontender and nondistended. No masses or hepatomegaly. Normal bowel sounds x 4 quadrants.  Rectal: Deferred.  Musculoskeletal: Symmetrical with no gross deformities. Extremities: No edema. Neurological: Alert oriented x 4. No focal deficits.  Psychological: Alert and cooperative. Normal mood and affect  Assessment and Recommendations:  7. 35 year old female with non-ulcer dyspepsia and nausea with one episode of vomiting.  -Abdominal sonogram to evaluate the gallbladder  -Famotidine 20mg  QD -Dicyclomine 10mg  po tid PRN -Call office if symptoms worsen -CBC, CMP -Follow up in 6 weeks   2. Abdominal MRI 05/13/2016 showed Multiple small benign hepatic lesions, including focal nodular hyperplasia, hemangioma, and cysts  3. Colon cancer screening -Colonoscopy age 39, earlier if symptoms warrant   4. Vitamin D deficiency -Vitamin D level

## 2020-10-03 NOTE — Progress Notes (Signed)
Noted  

## 2020-10-07 ENCOUNTER — Ambulatory Visit
Admission: RE | Admit: 2020-10-07 | Discharge: 2020-10-07 | Disposition: A | Discharge status: Home / Self Care | Payer: PRIVATE HEALTH INSURANCE | Source: Ambulatory Visit | Attending: Physician Assistant | Admitting: Physician Assistant

## 2020-10-07 ENCOUNTER — Ambulatory Visit
Admission: RE | Admit: 2020-10-07 | Discharge: 2020-10-07 | Disposition: A | Discharge status: Home / Self Care | Payer: Medicaid Other | Source: Ambulatory Visit | Attending: Physician Assistant | Admitting: Physician Assistant

## 2020-10-07 ENCOUNTER — Other Ambulatory Visit: Payer: Self-pay

## 2020-10-07 DIAGNOSIS — N644 Mastodynia: Secondary | ICD-10-CM

## 2020-10-08 ENCOUNTER — Other Ambulatory Visit: Payer: Self-pay

## 2020-10-08 DIAGNOSIS — E559 Vitamin D deficiency, unspecified: Secondary | ICD-10-CM

## 2020-10-09 ENCOUNTER — Ambulatory Visit (HOSPITAL_COMMUNITY)
Admission: RE | Admit: 2020-10-09 | Discharge: 2020-10-09 | Disposition: A | Discharge status: Home / Self Care | Payer: Self-pay | Source: Ambulatory Visit | Attending: Nurse Practitioner | Admitting: Nurse Practitioner

## 2020-10-09 ENCOUNTER — Other Ambulatory Visit: Payer: Self-pay

## 2020-10-09 DIAGNOSIS — R1084 Generalized abdominal pain: Secondary | ICD-10-CM | POA: Insufficient documentation

## 2020-10-09 DIAGNOSIS — K769 Liver disease, unspecified: Secondary | ICD-10-CM | POA: Insufficient documentation

## 2020-10-09 DIAGNOSIS — R1013 Epigastric pain: Secondary | ICD-10-CM | POA: Insufficient documentation

## 2020-11-05 ENCOUNTER — Ambulatory Visit: Payer: Self-pay | Admitting: Nurse Practitioner

## 2020-11-05 NOTE — Progress Notes (Deleted)
     11/05/2020 Natalie Thornton [id number] 01/14/1986   Chief Complaint:  History of Present Illness: Natalie Thornton 35 year old female with a past medical history of anxiety, arthritis, chronic back pain and + H. Pylori 05/2017 and chronic functional abdominal pain    CBC Latest Ref Rng & Units 10/01/2020 07/23/2020 05/01/2019  WBC 4.0 - 10.5 K/uL 6.1 3.5 4.6  Hemoglobin 12.0 - 15.0 g/dL 12.8 13.6 14.0  Hematocrit 36.0 - 46.0 % 38.7 41.7 41.8  Platelets 150.0 - 400.0 K/uL 223.0 229 255   CMP Latest Ref Rng & Units 10/01/2020 07/23/2020 10/30/2019  Glucose 70 - 99 mg/dL 92 83 76  BUN 6 - 23 mg/dL 11 6 8   Creatinine 0.40 - 1.20 mg/dL 1.06 0.96 0.91  Sodium 135 - 145 mEq/L 139 141 139  Potassium 3.5 - 5.1 mEq/L 3.5 4.7 4.8  Chloride 96 - 112 mEq/L 105 103 100  CO2 19 - 32 mEq/L 28 23 23   Calcium 8.4 - 10.5 mg/dL 9.8 9.6 9.5  Total Protein 6.0 - 8.3 g/dL 7.0 7.0 6.6  Total Bilirubin 0.2 - 1.2 mg/dL 0.2 0.3 0.3  Alkaline Phos 39 - 117 U/L 37(L) 37(L) 36(L)  AST 0 - 37 U/L 15 14 20   ALT 0 - 35 U/L 9 6 11     Abdominal sonogram 10/09/2020: ULTRASOUND ABDOMEN LIMITED RIGHT UPPER QUADRANT  COMPARISON:  CT April 15, 2017  FINDINGS: Gallbladder:  No gallstones or wall thickening visualized. No sonographic Murphy sign noted by sonographer.  Common bile duct: Diameter: 2 mm  Liver:  Well-circumscribed 1.8 cm hyperechoic lesion in the left lobe of the liver which demonstrates increased through transmission and a well-circumscribed 1.2 cm hyperechoic lesion in the right lobe of the liver which demonstrates increased through transmission these appear to correspond with hypodense lesion seen on prior CT. Within normal limits in parenchymal echogenicity. Portal vein is patent on color Doppler imaging with normal direction of blood flow towards the liver. IMPRESSION: 1. No acute abnormality. 2. Well-circumscribed hyperechoic lesions in the liver, most consistent with benign  hemangiomas.   Current Medications, Allergies, Past Medical History, Past Surgical History, Family History and Social History were reviewed in [employer] record.   Review of Systems:   Constitutional: Negative for fever, sweats, chills or weight loss.  Respiratory: Negative for shortness of breath.   Cardiovascular: Negative for chest pain, palpitations and leg swelling.  Gastrointestinal: See HPI.  Musculoskeletal: Negative for back pain or muscle aches.  Neurological: Negative for dizziness, headaches or paresthesias.    Physical Exam: There were no vitals taken for this visit. General: Well developed, w   ***female in no acute distress. Head: Normocephalic and atraumatic. Eyes: No scleral icterus. Conjunctiva pink . Ears: Normal auditory acuity. Mouth: Dentition intact. No ulcers or lesions.  Lungs: Clear throughout to auscultation. Heart: Regular rate and rhythm, no murmur. Abdomen: Soft, nontender and nondistended. No masses or hepatomegaly. Normal bowel sounds x 4 quadrants.  Rectal: *** Musculoskeletal: Symmetrical with no gross deformities. Extremities: No edema. Neurological: Alert oriented x 4. No focal deficits.  Psychological: Alert and cooperative. Normal mood and affect  Assessment and Recommendations: ***

## 2020-11-06 ENCOUNTER — Ambulatory Visit: Payer: Medicaid Other | Admitting: Internal Medicine

## 2020-12-03 ENCOUNTER — Ambulatory Visit (HOSPITAL_COMMUNITY)
Admission: EM | Admit: 2020-12-03 | Discharge: 2020-12-03 | Disposition: A | Discharge status: Home / Self Care | Payer: PRIVATE HEALTH INSURANCE

## 2020-12-03 ENCOUNTER — Other Ambulatory Visit: Payer: Self-pay

## 2020-12-03 ENCOUNTER — Encounter (HOSPITAL_COMMUNITY): Payer: Self-pay

## 2020-12-03 DIAGNOSIS — H6121 Impacted cerumen, right ear: Secondary | ICD-10-CM | POA: Diagnosis not present

## 2020-12-03 NOTE — Discharge Instructions (Addendum)
You can continue to use Debrox or other ear drops to help prevent wax build up.    Do not stick anything smaller than your pinky-tip into your ear.    Return or go to the Emergency Department if symptoms worsen or do not improve in the next few days.

## 2020-12-03 NOTE — ED Provider Notes (Signed)
[facility]    CSN: [id number] Arrival date & time: 12/03/20  1146      History   Chief Complaint Chief Complaint  Patient presents with   Ear Fullness    HPI Natalie Thornton is a 35 y.o. female.   Patient here for evaluation of right-sided ear fullness that has been ongoing for the past several days.  Reports using eardrops and Q-tips that were initially helpful but now feels like it is clogged again.  Reports swimming recently.  Denies any trauma, injury, or other precipitating event.  Denies any specific alleviating or aggravating factors.  Denies any fevers, chest pain, shortness of breath, N/V/D, numbness, tingling, weakness, abdominal pain, or headaches.     The history is provided by the patient.  Ear Fullness   Past Medical History:  Diagnosis Date   Anxiety    Arthritis    Chronic back pain    DDD (degenerative disc disease)    Degeneration of intervertebral disc of lumbar region 07/30/2019   Degenerative disc disease, cervical    Disc disease, degenerative, cervical    H pylori ulcer    H/O degenerative disc disease 2013   History of hiatal hernia    Low back pain    Numbness    Whole back    Pilonidal cyst     Patient Active Problem List   Diagnosis Date Noted   Epigastric pain 09/10/2020   Lipoma of back 06/05/2019   Lumbar foraminal stenosis 03/13/2019   Neck pain 01/18/2019   S/P excision of lipoma 09/12/2018   Chronic bilateral thoracic back pain 07/17/2018   DDD (degenerative disc disease), lumbar 06/14/2018   Anxiety and depression 01/06/2017   Cigarette smoker 10/20/2016   Solitary pulmonary nodule 10/20/2016    Past Surgical History:  Procedure Laterality Date   COLONOSCOPY     LIPOMA EXCISION Right 08/10/2018   Procedure: EXCISION LIPOMA, RIGHT;  Surgeon: Fredirick Maudlin, MD;  Location: [facility] ORS;  Service: General;  Laterality: Right;    OB History   No obstetric history on file.      Home Medications    Prior to  Admission medications   Medication Sig Start Date End Date Taking? Authorizing Provider  dicyclomine (BENTYL) 10 MG capsule Take 1 capsule (10 mg total) by mouth 3 (three) times daily. 10/01/20   Noralyn Pick, NP  famotidine (PEPCID) 20 MG tablet Take 1 tablet (20 mg total) by mouth daily. 10/01/20   Noralyn Pick, NP  albuterol (VENTOLIN HFA) 108 (90 Base) MCG/ACT inhaler Inhale 2 puffs into the lungs every 2 (two) hours as needed for wheezing or shortness of breath (cough). Patient not taking: No sig reported 03/08/19 06/15/19  Muthersbaugh, Jarrett Soho, PA-C  omeprazole (PRILOSEC) 20 MG capsule Take 1 capsule (20 mg total) by mouth daily. Patient not taking: No sig reported 10/30/19 12/03/19  Argentina Donovan, PA-C  sucralfate (CARAFATE) 1 GM/10ML suspension Take 10 mLs (1 g total) by mouth 4 (four) times daily -  with meals and at bedtime. Patient not taking: No sig reported 06/09/19 06/15/19  Jaynee Eagles, PA-C    Family History Family History  Problem Relation Age of Onset   Healthy Mother    Healthy Father    AAA (abdominal aortic aneurysm) Maternal Grandmother    Leukemia Maternal Grandmother    Breast cancer Maternal Grandmother    Head & neck cancer Paternal Grandmother        Back cancer   Lymphoma  Paternal Grandmother    Arthritis Paternal Grandmother    Cirrhosis Maternal Uncle    Breast cancer Other    Arthritis Paternal Grandfather    Colon cancer Neg Hx    Esophageal cancer Neg Hx    Rectal cancer Neg Hx    Stomach cancer Neg Hx     Social History Social History   Tobacco Use   Smoking status: Some Days    Pack years: 0.00    Types: Cigars   Smokeless tobacco: Never   Tobacco comments:    BLACK AND MILDS. 2 pks per week  Vaping Use   Vaping Use: Never used  Substance Use Topics   Alcohol use: Yes    Comment: occasionally   Drug use: No     Allergies   Patient has no known allergies.   Review of Systems Review of Systems  HENT:  Positive  for ear discharge and ear pain.   All other systems reviewed and are negative.   Physical Exam Triage Vital Signs ED Triage Vitals  Enc Vitals Group     BP 12/03/20 1223 124/80     Pulse Rate 12/03/20 1223 68     Resp 12/03/20 1223 18     Temp 12/03/20 1223 99.7 F (37.6 C)     Temp Source 12/03/20 1223 Oral     SpO2 12/03/20 1223 98 %     Weight --      Height --      Head Circumference --      Peak Flow --      Pain Score 12/03/20 1226 2     Pain Loc --      Pain Edu? --      Excl. in [place]? --    No data found.  Updated Vital Signs BP 124/80 (BP Location: Left Arm)   Pulse 68   Temp 99.7 F (37.6 C) (Oral)   Resp 18   LMP 11/10/2020   SpO2 98%   Visual Acuity Right Eye Distance:   Left Eye Distance:   Bilateral Distance:    Right Eye Near:   Left Eye Near:    Bilateral Near:     Physical Exam Vitals and nursing note reviewed.  Constitutional:      General: She is not in acute distress.    Appearance: Normal appearance. She is not ill-appearing, toxic-appearing or diaphoretic.  HENT:     Head: Normocephalic and atraumatic.     Right Ear: There is impacted cerumen.  Eyes:     Conjunctiva/sclera: Conjunctivae normal.  Cardiovascular:     Rate and Rhythm: Normal rate.     Pulses: Normal pulses.  Pulmonary:     Effort: Pulmonary effort is normal.  Abdominal:     General: Abdomen is flat.  Musculoskeletal:        General: Normal range of motion.     Cervical back: Normal range of motion.  Skin:    General: Skin is warm and dry.  Neurological:     General: No focal deficit present.     Mental Status: She is alert and oriented to person, place, and time.  Psychiatric:        Mood and Affect: Mood normal.     UC Treatments / Results  Labs (all labs ordered are listed, but only abnormal results are displayed) Labs Reviewed - No data to display  EKG   Radiology No results found.  Procedures Procedures (including critical care  time)  Medications Ordered in UC Medications - No data to display  Initial Impression / Assessment and Plan / UC Course  I have reviewed the triage vital signs and the nursing notes.  Pertinent labs & imaging results that were available during my care of the patient were reviewed by me and considered in my medical decision making (see chart for details).    Assessment negative for red flags or concerns.  Ear lavage performed in office by staff.  Post lavage, ear canal clear and TM pearly, white.  May continue to use Debrox and OTC drop to help with ear wax build up.  Instructed patient that she should not put anything smaller than her pinky into her ear. Follow up with primary care as needed.  Final Clinical Impressions(s) / UC Diagnoses   Final diagnoses:  Impacted cerumen of right ear     Discharge Instructions      You can continue to use Debrox or other ear drops to help prevent wax build up.    Do not stick anything smaller than your pinky-tip into your ear.    Return or go to the Emergency Department if symptoms worsen or do not improve in the next few days.      ED Prescriptions   None    PDMP not reviewed this encounter.   Pearson Forster, NP 12/03/20 1315

## 2020-12-03 NOTE — ED Triage Notes (Signed)
Pt presents with right ear fullness X 3 days.

## 2020-12-25 ENCOUNTER — Emergency Department (HOSPITAL_COMMUNITY)
Admission: EM | Admit: 2020-12-25 | Discharge: 2020-12-25 | Disposition: A | Discharge status: Home / Self Care | Payer: PRIVATE HEALTH INSURANCE | Attending: Emergency Medicine | Admitting: Emergency Medicine

## 2020-12-25 ENCOUNTER — Emergency Department (HOSPITAL_COMMUNITY): Payer: PRIVATE HEALTH INSURANCE

## 2020-12-25 ENCOUNTER — Encounter (HOSPITAL_COMMUNITY): Payer: Self-pay | Admitting: Emergency Medicine

## 2020-12-25 DIAGNOSIS — N83201 Unspecified ovarian cyst, right side: Secondary | ICD-10-CM

## 2020-12-25 DIAGNOSIS — R1030 Lower abdominal pain, unspecified: Secondary | ICD-10-CM

## 2020-12-25 DIAGNOSIS — D259 Leiomyoma of uterus, unspecified: Secondary | ICD-10-CM

## 2020-12-25 DIAGNOSIS — F17290 Nicotine dependence, other tobacco product, uncomplicated: Secondary | ICD-10-CM | POA: Insufficient documentation

## 2020-12-25 DIAGNOSIS — R102 Pelvic and perineal pain: Secondary | ICD-10-CM | POA: Insufficient documentation

## 2020-12-25 LAB — CBC WITH DIFFERENTIAL/PLATELET
Abs Immature Granulocytes: 0.01 10*3/uL (ref 0.00–0.07)
Basophils Absolute: 0 10*3/uL (ref 0.0–0.1)
Basophils Relative: 0 %
Eosinophils Absolute: 0.1 10*3/uL (ref 0.0–0.5)
Eosinophils Relative: 1 %
HCT: 39.3 % (ref 36.0–46.0)
Hemoglobin: 12.9 g/dL (ref 12.0–15.0)
Immature Granulocytes: 0 %
Lymphocytes Relative: 32 %
Lymphs Abs: 1.8 10*3/uL (ref 0.7–4.0)
MCH: 30.4 pg (ref 26.0–34.0)
MCHC: 32.8 g/dL (ref 30.0–36.0)
MCV: 92.7 fL (ref 80.0–100.0)
Monocytes Absolute: 0.4 10*3/uL (ref 0.1–1.0)
Monocytes Relative: 7 %
Neutro Abs: 3.3 10*3/uL (ref 1.7–7.7)
Neutrophils Relative %: 60 %
Platelets: 233 10*3/uL (ref 150–400)
RBC: 4.24 MIL/uL (ref 3.87–5.11)
RDW: 12.4 % (ref 11.5–15.5)
WBC: 5.5 10*3/uL (ref 4.0–10.5)
nRBC: 0 % (ref 0.0–0.2)

## 2020-12-25 LAB — COMPREHENSIVE METABOLIC PANEL
ALT: 14 U/L (ref 0–44)
AST: 18 U/L (ref 15–41)
Albumin: 3.8 g/dL (ref 3.5–5.0)
Alkaline Phosphatase: 32 U/L — ABNORMAL LOW (ref 38–126)
Anion gap: 7 (ref 5–15)
BUN: 7 mg/dL (ref 6–20)
CO2: 26 mmol/L (ref 22–32)
Calcium: 9 mg/dL (ref 8.9–10.3)
Chloride: 103 mmol/L (ref 98–111)
Creatinine, Ser: 0.9 mg/dL (ref 0.44–1.00)
GFR, Estimated: >60 mL/min (ref >60)
Glucose, Bld: 103 mg/dL — ABNORMAL HIGH (ref 70–99)
Potassium: 3.8 mmol/L (ref 3.5–5.1)
Sodium: 136 mmol/L (ref 135–145)
Total Bilirubin: 0.9 mg/dL (ref 0.3–1.2)
Total Protein: 6.4 g/dL — ABNORMAL LOW (ref 6.5–8.1)

## 2020-12-25 LAB — URINALYSIS, ROUTINE W REFLEX MICROSCOPIC
Bilirubin Urine: NEGATIVE
Glucose, UA: NEGATIVE mg/dL
Hgb urine dipstick: NEGATIVE
Ketones, ur: NEGATIVE mg/dL
Leukocytes,Ua: NEGATIVE
Nitrite: NEGATIVE
Protein, ur: NEGATIVE mg/dL
Specific Gravity, Urine: 1.017 (ref 1.005–1.030)
pH: 8 (ref 5.0–8.0)

## 2020-12-25 LAB — PREGNANCY, URINE: Preg Test, Ur: NEGATIVE

## 2020-12-25 MED ORDER — ACETAMINOPHEN 500 MG PO TABS
1000.0000 mg | ORAL_TABLET | Freq: Once | ORAL | Status: AC
  Administered 2020-12-25: 1000 mg via ORAL
  Filled 2020-12-25: qty 2

## 2020-12-25 MED ORDER — IBUPROFEN 400 MG PO TABS
400.0000 mg | ORAL_TABLET | Freq: Once | ORAL | Status: AC
  Administered 2020-12-25: 400 mg via ORAL
  Filled 2020-12-25: qty 1

## 2020-12-25 NOTE — Discharge Instructions (Addendum)
It was our pleasure to provide your ER care today - we hope that you feel better.  Your imaging shows uterine fibroids and a small right ovarian cyst.  Take acetaminophen and/or ibuprofen as need for pain.  Follow up with ob/gyn doctor in the next 1-2 weeks.   Return to ER if worse, new symptoms, fevers, new, worsening, or severe abdominal pain, persistent vomiting, or other concern.

## 2020-12-25 NOTE — ED Provider Notes (Signed)
[facility] EMERGENCY DEPARTMENT Provider Note   CSN: [id number] Arrival date & time: 12/25/20  1219     History Chief Complaint  Patient presents with   Abdominal Pain    Natalie Thornton is a 35 y.o. female.  Patient c/o bilateral lower abd pain. States symptoms on and off in past couple weeks, acute onset, dull, moderate, non radiating, without specific exacerbating or alleviating factors. Pt notes hx ovarian cyst. Denies hx endometriosis. Denies prior abd surgery. Normal appetite. No nvd. Is having normal bms. Last period 2-3 weeks ago, normal. No current vaginal discharge or bleeding. No dysuria, hematuria or gu c/o. No back/flank pain. Has not taken anything for pain today.   The history is provided by the patient.      Past Medical History:  Diagnosis Date   Anxiety    Arthritis    Chronic back pain    DDD (degenerative disc disease)    Degeneration of intervertebral disc of lumbar region 07/30/2019   Degenerative disc disease, cervical    Disc disease, degenerative, cervical    H pylori ulcer    H/O degenerative disc disease 2013   History of hiatal hernia    Low back pain    Numbness    Whole back    Pilonidal cyst     Patient Active Problem List   Diagnosis Date Noted   Epigastric pain 09/10/2020   Lipoma of back 06/05/2019   Lumbar foraminal stenosis 03/13/2019   Neck pain 01/18/2019   S/P excision of lipoma 09/12/2018   Chronic bilateral thoracic back pain 07/17/2018   DDD (degenerative disc disease), lumbar 06/14/2018   Anxiety and depression 01/06/2017   Cigarette smoker 10/20/2016   Solitary pulmonary nodule 10/20/2016    Past Surgical History:  Procedure Laterality Date   COLONOSCOPY     LIPOMA EXCISION Right 08/10/2018   Procedure: EXCISION LIPOMA, RIGHT;  Surgeon: Fredirick Maudlin, MD;  Location: [facility] ORS;  Service: General;  Laterality: Right;     OB History   No obstetric history on file.     Family History   Problem Relation Age of Onset   Healthy Mother    Healthy Father    AAA (abdominal aortic aneurysm) Maternal Grandmother    Leukemia Maternal Grandmother    Breast cancer Maternal Grandmother    Head & neck cancer Paternal Grandmother        Back cancer   Lymphoma Paternal Grandmother    Arthritis Paternal Grandmother    Cirrhosis Maternal Uncle    Breast cancer Other    Arthritis Paternal Grandfather    Colon cancer Neg Hx    Esophageal cancer Neg Hx    Rectal cancer Neg Hx    Stomach cancer Neg Hx     Social History   Tobacco Use   Smoking status: Some Days    Types: Cigars   Smokeless tobacco: Never   Tobacco comments:    BLACK AND MILDS. 2 pks per week  Vaping Use   Vaping Use: Never used  Substance Use Topics   Alcohol use: Yes    Comment: occasionally   Drug use: No    Home Medications Prior to Admission medications   Medication Sig Start Date End Date Taking? Authorizing Provider  dicyclomine (BENTYL) 10 MG capsule Take 1 capsule (10 mg total) by mouth 3 (three) times daily. 10/01/20   Noralyn Pick, NP  famotidine (PEPCID) 20 MG tablet Take 1 tablet (20 mg total) by  mouth daily. 10/01/20   Noralyn Pick, NP  albuterol (VENTOLIN HFA) 108 (90 Base) MCG/ACT inhaler Inhale 2 puffs into the lungs every 2 (two) hours as needed for wheezing or shortness of breath (cough). Patient not taking: No sig reported 03/08/19 06/15/19  Muthersbaugh, Jarrett Soho, PA-C  omeprazole (PRILOSEC) 20 MG capsule Take 1 capsule (20 mg total) by mouth daily. Patient not taking: No sig reported 10/30/19 12/03/19  Argentina Donovan, PA-C  sucralfate (CARAFATE) 1 GM/10ML suspension Take 10 mLs (1 g total) by mouth 4 (four) times daily -  with meals and at bedtime. Patient not taking: No sig reported 06/09/19 06/15/19  Jaynee Eagles, PA-C    Allergies    Patient has no known allergies.  Review of Systems   Review of Systems  Constitutional:  Negative for fever.  HENT:  Negative  for sore throat.   Eyes:  Negative for redness.  Respiratory:  Negative for cough and shortness of breath.   Cardiovascular:  Negative for chest pain.  Gastrointestinal:  Negative for abdominal pain, diarrhea and vomiting.  Genitourinary:  Negative for dysuria and flank pain.  Musculoskeletal:  Negative for back pain and neck pain.  Skin:  Negative for rash.  Neurological:  Negative for headaches.  Hematological:  Does not bruise/bleed easily.  Psychiatric/Behavioral:  Negative for confusion.    Physical Exam Updated Vital Signs BP 111/64   Pulse 68   Temp 99.1 F (37.3 C) (Oral)   Resp 15   Ht 1.499 m ('4\' 11"'$ )   Wt 63 kg   SpO2 100%   BMI 28.07 kg/m   Physical Exam Vitals and nursing note reviewed.  Constitutional:      Appearance: Normal appearance. She is well-developed.  HENT:     Head: Atraumatic.     Nose: Nose normal.     Mouth/Throat:     Mouth: Mucous membranes are moist.  Eyes:     General: No scleral icterus.    Conjunctiva/sclera: Conjunctivae normal.  Neck:     Trachea: No tracheal deviation.  Cardiovascular:     Rate and Rhythm: Normal rate and regular rhythm.     Pulses: Normal pulses.     Heart sounds: Normal heart sounds. No murmur heard.   No friction rub. No gallop.  Pulmonary:     Effort: Pulmonary effort is normal. No respiratory distress.     Breath sounds: Normal breath sounds.  Abdominal:     General: Bowel sounds are normal. There is no distension.     Palpations: Abdomen is soft. There is no mass.     Tenderness: There is no abdominal tenderness. There is no guarding or rebound.     Hernia: No hernia is present.  Genitourinary:    Comments: No cva tenderness.  Musculoskeletal:        General: No swelling or tenderness.     Cervical back: Normal range of motion and neck supple. No rigidity. No muscular tenderness.  Skin:    General: Skin is warm and dry.     Findings: No rash.  Neurological:     Mental Status: She is alert.      Comments: Alert, speech normal.   Psychiatric:        Mood and Affect: Mood normal.    ED Results / Procedures / Treatments   Labs (all labs ordered are listed, but only abnormal results are displayed) Results for orders placed or performed during the hospital encounter of 12/25/20  Comprehensive metabolic panel  Result Value Ref Range   Sodium 136 135 - 145 mmol/L   Potassium 3.8 3.5 - 5.1 mmol/L   Chloride 103 98 - 111 mmol/L   CO2 26 22 - 32 mmol/L   Glucose, Bld 103 (H) 70 - 99 mg/dL   BUN 7 6 - 20 mg/dL   Creatinine, Ser 0.90 0.44 - 1.00 mg/dL   Calcium 9.0 8.9 - 10.3 mg/dL   Total Protein 6.4 (L) 6.5 - 8.1 g/dL   Albumin 3.8 3.5 - 5.0 g/dL   AST 18 15 - 41 U/L   ALT 14 0 - 44 U/L   Alkaline Phosphatase 32 (L) 38 - 126 U/L   Total Bilirubin 0.9 0.3 - 1.2 mg/dL   GFR, Estimated >60 >60 mL/min   Anion gap 7 5 - 15  CBC with Differential  Result Value Ref Range   WBC 5.5 4.0 - 10.5 K/uL   RBC 4.24 3.87 - 5.11 MIL/uL   Hemoglobin 12.9 12.0 - 15.0 g/dL   HCT 39.3 36.0 - 46.0 %   MCV 92.7 80.0 - 100.0 fL   MCH 30.4 26.0 - 34.0 pg   MCHC 32.8 30.0 - 36.0 g/dL   RDW 12.4 11.5 - 15.5 %   Platelets 233 150 - 400 K/uL   nRBC 0.0 0.0 - 0.2 %   Neutrophils Relative % 60 %   Neutro Abs 3.3 1.7 - 7.7 K/uL   Lymphocytes Relative 32 %   Lymphs Abs 1.8 0.7 - 4.0 K/uL   Monocytes Relative 7 %   Monocytes Absolute 0.4 0.1 - 1.0 K/uL   Eosinophils Relative 1 %   Eosinophils Absolute 0.1 0.0 - 0.5 K/uL   Basophils Relative 0 %   Basophils Absolute 0.0 0.0 - 0.1 K/uL   Immature Granulocytes 0 %   Abs Immature Granulocytes 0.01 0.00 - 0.07 K/uL  Urinalysis, Routine w reflex microscopic Urine, Clean Catch  Result Value Ref Range   Color, Urine YELLOW YELLOW   APPearance CLEAR CLEAR   Specific Gravity, Urine 1.017 1.005 - 1.030   pH 8.0 5.0 - 8.0   Glucose, UA NEGATIVE NEGATIVE mg/dL   Hgb urine dipstick NEGATIVE NEGATIVE   Bilirubin Urine NEGATIVE NEGATIVE   Ketones, ur  NEGATIVE NEGATIVE mg/dL   Protein, ur NEGATIVE NEGATIVE mg/dL   Nitrite NEGATIVE NEGATIVE   Leukocytes,Ua NEGATIVE NEGATIVE  Pregnancy, urine  Result Value Ref Range   Preg Test, Ur NEGATIVE NEGATIVE   US PELVIC COMPLETE W TRANSVAGINAL AND TORSION R/O  Result Date: 12/25/2020 CLINICAL DATA:  pelvic pain EXAM: TRANSABDOMINAL AND TRANSVAGINAL ULTRASOUND OF PELVIS DOPPLER ULTRASOUND OF OVARIES TECHNIQUE: Both transabdominal and transvaginal ultrasound examinations of the pelvis were performed. Transabdominal technique was performed for global imaging of the pelvis including uterus, ovaries, adnexal regions, and pelvic cul-de-sac. It was necessary to proceed with endovaginal exam following the transabdominal exam to visualize the ovaries, endometrium and cul-de-sac. Color and duplex Doppler ultrasound was utilized to evaluate blood flow to the ovaries. COMPARISON:  None. FINDINGS: Uterus Measurements: 8.2 x 3.7 x 5.3 cm = volume: 83 mL. There is an intramural fibroid in the RIGHT aspect of the uterus which measures 3.9 x 3.0 x 4.1 cm. Endometrium Thickness: 10 mm.  No focal abnormality visualized. Right ovary Measurements: 4.3 x 2.9 by 2.1 cm = volume: 14 mL. Normal appearance/no adnexal mass. Multiple follicles are noted. Left ovary Measurements: 3.0 x 1.4 by 2.0 cm = volume: 4 mL. Normal appearance/no adnexal mass. Pulsed Doppler evaluation  of both ovaries demonstrates normal low-resistance arterial and venous waveforms. Other findings Trace pelvic free fluid. In the RIGHT adnexa there is an anechoic, circumscribed mass with posterior acoustic enhancement consistent with a cyst which measures 17 x 13 by 18 mm. This likely reflects a benign paraovarian cyst. IMPRESSION: 1. Fibroid uterus. Electronically Signed   By: Valentino Saxon MD   On: 12/25/2020 14:43    EKG None  Radiology US PELVIC COMPLETE W TRANSVAGINAL AND TORSION R/O  Result Date: 12/25/2020 CLINICAL DATA:  pelvic pain EXAM:  TRANSABDOMINAL AND TRANSVAGINAL ULTRASOUND OF PELVIS DOPPLER ULTRASOUND OF OVARIES TECHNIQUE: Both transabdominal and transvaginal ultrasound examinations of the pelvis were performed. Transabdominal technique was performed for global imaging of the pelvis including uterus, ovaries, adnexal regions, and pelvic cul-de-sac. It was necessary to proceed with endovaginal exam following the transabdominal exam to visualize the ovaries, endometrium and cul-de-sac. Color and duplex Doppler ultrasound was utilized to evaluate blood flow to the ovaries. COMPARISON:  None. FINDINGS: Uterus Measurements: 8.2 x 3.7 x 5.3 cm = volume: 83 mL. There is an intramural fibroid in the RIGHT aspect of the uterus which measures 3.9 x 3.0 x 4.1 cm. Endometrium Thickness: 10 mm.  No focal abnormality visualized. Right ovary Measurements: 4.3 x 2.9 by 2.1 cm = volume: 14 mL. Normal appearance/no adnexal mass. Multiple follicles are noted. Left ovary Measurements: 3.0 x 1.4 by 2.0 cm = volume: 4 mL. Normal appearance/no adnexal mass. Pulsed Doppler evaluation of both ovaries demonstrates normal low-resistance arterial and venous waveforms. Other findings Trace pelvic free fluid. In the RIGHT adnexa there is an anechoic, circumscribed mass with posterior acoustic enhancement consistent with a cyst which measures 17 x 13 by 18 mm. This likely reflects a benign paraovarian cyst. IMPRESSION: 1. Fibroid uterus. Electronically Signed   By: Valentino Saxon MD   On: 12/25/2020 14:43    Procedures Procedures   Medications Ordered in ED Medications  acetaminophen (TYLENOL) tablet 1,000 mg (has no administration in time range)  ibuprofen (ADVIL) tablet 400 mg (has no administration in time range)    ED Course  I have reviewed the triage vital signs and the nursing notes.  Pertinent labs & imaging results that were available during my care of the patient were reviewed by me and considered in my medical decision making (see chart for  details).    MDM Rules/Calculators/A&P                          Labs sent. Imaging ordered.   Reviewed nursing notes and prior charts for additional history.   Labs reviewed/interpreted by me - wbc normal. Preg neg.   U/s reviewed/interpreted by me - fibroids.  No meds pta. Acetaminophen po, ibuprofen po. Po fluids.   Abd is soft and non tender.  Pt currently appears stable for d/c.   Return precautions provided.      Final Clinical Impression(s) / ED Diagnoses Final diagnoses:  Pelvic pain    Rx / DC Orders ED Discharge Orders     None        Lajean Saver, MD 12/25/20 1842

## 2020-12-25 NOTE — ED Provider Notes (Signed)
Emergency Medicine Provider Triage Evaluation Note  Natalie Thornton , a 35 y.o. female  was evaluated in triage.  Pt complains of right pelvic pain.  History of ovarian cyst, yesterday pain started intensely.  It comes and goes, radiated to the suprapubic area.  No dysuria or hematuria.  No flank pain.  No history of kidney stones or previous abdominal surgeries..  Review of Systems  Positive: Right adnexal/pelvic pain Negative: Dysuria, hematuria  Physical Exam  There were no vitals taken for this visit. Gen:   Awake, no distress   Resp:  Normal effort  MSK:   Moves extremities without difficulty  Other:  No CVA tenderness, right pelvic tenderness and suprapubic tenderness   Medical Decision Making  Medically screening exam initiated at 12:32 PM.  Appropriate orders placed.  Natalie Thornton was informed that the remainder of the evaluation will be completed by another provider, this initial triage assessment does not replace that evaluation, and the importance of remaining in the ED until their evaluation is complete.     Sherrill Raring, PA-C 12/25/20 1233    Quintella Reichert, MD 12/25/20 1655

## 2020-12-25 NOTE — ED Triage Notes (Signed)
Pt endorses right sided pelvic since yesterday. Denies n/v/d.

## 2021-01-04 ENCOUNTER — Ambulatory Visit: Payer: Self-pay

## 2021-01-04 NOTE — Telephone Encounter (Signed)
Pt called stating she has moderate to severe back pain on her left side.  [facility] agent has already looked for an appointment with none available. Mobile clinic is not operating. Pt did not want warm transfer to to clinic helping with appointments.   Patient states that she will go to UC near her house on Weds when she has off.  Reason for Disposition  [1] MODERATE back pain (e.g., interferes with normal activities) AND [2] present > 3 days  Answer Assessment - Initial Assessment Questions 1. ONSET: "When did the pain begin?"      01/03/2021 2. LOCATION: "Where does it hurt?" (upper, mid or lower back)     Left side of her back, 3. SEVERITY: "How bad is the pain?"  (e.g., Scale 1-10; mild, moderate, or severe)   - MILD (1-3): doesn't interfere with normal activities    - MODERATE (4-7): interferes with normal activities or awakens from sleep    - SEVERE (8-10): excruciating pain, unable to do any normal activities      Pain goes from 7-10.  4. PATTERN: "Is the pain constant?" (e.g., yes, no; constant, intermittent)      No - severity changes 5. RADIATION: "Does the pain shoot into your legs or elsewhere?"     no 6. CAUSE:  "What do you think is causing the back pain?"      unknown 7. BACK OVERUSE:  "Any recent lifting of heavy objects, strenuous work or exercise?"     no 8. MEDICATIONS: "What have you taken so far for the pain?" (e.g., nothing, acetaminophen, NSAIDS)     Tylenol extra strength 9. NEUROLOGIC SYMPTOMS: "Do you have any weakness, numbness, or problems with bowel/bladder control?"     no 10. OTHER SYMPTOMS: "Do you have any other symptoms?" (e.g., fever, abdominal pain, burning with urination, blood in urine)      no 11. PREGNANCY: "Is there any chance you are pregnant?" (e.g., yes, no; LMP)       unknown  Protocols used: Back Pain-A-AH

## 2021-01-22 ENCOUNTER — Other Ambulatory Visit: Payer: Self-pay

## 2021-01-28 ENCOUNTER — Inpatient Hospital Stay: Payer: Medicaid Other | Admitting: Internal Medicine

## 2021-02-05 ENCOUNTER — Emergency Department (HOSPITAL_COMMUNITY)
Admission: EM | Admit: 2021-02-05 | Discharge: 2021-02-06 | Disposition: A | Discharge status: Home / Self Care | Payer: Medicaid Other | Attending: Emergency Medicine | Admitting: Emergency Medicine

## 2021-02-05 ENCOUNTER — Other Ambulatory Visit: Payer: Self-pay

## 2021-02-05 ENCOUNTER — Encounter (HOSPITAL_COMMUNITY): Payer: Self-pay

## 2021-02-05 DIAGNOSIS — R112 Nausea with vomiting, unspecified: Secondary | ICD-10-CM | POA: Insufficient documentation

## 2021-02-05 DIAGNOSIS — Z5321 Procedure and treatment not carried out due to patient leaving prior to being seen by health care provider: Secondary | ICD-10-CM | POA: Insufficient documentation

## 2021-02-05 DIAGNOSIS — Z79899 Other long term (current) drug therapy: Secondary | ICD-10-CM | POA: Insufficient documentation

## 2021-02-05 DIAGNOSIS — R1084 Generalized abdominal pain: Secondary | ICD-10-CM | POA: Insufficient documentation

## 2021-02-05 LAB — URINALYSIS, ROUTINE W REFLEX MICROSCOPIC
Bilirubin Urine: NEGATIVE
Glucose, UA: NEGATIVE mg/dL
Ketones, ur: NEGATIVE mg/dL
Nitrite: NEGATIVE
Protein, ur: NEGATIVE mg/dL
Specific Gravity, Urine: 1.019 (ref 1.005–1.030)
pH: 6 (ref 5.0–8.0)

## 2021-02-05 LAB — I-STAT BETA HCG BLOOD, ED (MC, WL, AP ONLY): I-stat hCG, quantitative: <5 m[IU]/mL

## 2021-02-05 NOTE — ED Triage Notes (Signed)
Pt c/o abdominal pain x 2 months. Pt denies diarrhea. Pt has taken excedrin and a motrin for same. Pt has had nausea, denies vomiting.

## 2021-02-05 NOTE — ED Provider Notes (Signed)
Emergency Medicine Provider Triage Evaluation Note  Natalie Thornton , a 35 y.o. female  was evaluated in triage.  Pt complains of diffuse abdominal pain x 2 months.  States almost daily nausea/vomiting.  Denies diarrhea.  No prior abdominal surgeries.  Had motrin and Excedrin PTA today.  Review of Systems  Positive: Nausea/vomiting Negative: diarrhea  Physical Exam  BP 126/60 (BP Location: Right Arm)   Pulse 79   Temp 98.6 F (37 C) (Oral)   Resp 20   SpO2 100%   Gen:   Awake, no distress   Resp:  Normal effort  MSK:   Moves extremities without difficulty  Other:    Medical Decision Making  Medically screening exam initiated at 11:19 PM.  Appropriate orders placed.  Natalie Thornton was informed that the remainder of the evaluation will be completed by another provider, this initial triage assessment does not replace that evaluation, and the importance of remaining in the ED until their evaluation is complete.  Will check labs, UDS.   Larene Pickett, PA-C 02/05/21 2321    Lacretia Leigh, MD 02/08/21 [phone]

## 2021-02-06 LAB — CBC WITH DIFFERENTIAL/PLATELET
Abs Immature Granulocytes: 0.01 10*3/uL (ref 0.00–0.07)
Basophils Absolute: 0 10*3/uL (ref 0.0–0.1)
Basophils Relative: 1 %
Eosinophils Absolute: 0.1 10*3/uL (ref 0.0–0.5)
Eosinophils Relative: 3 %
HCT: 37.7 % (ref 36.0–46.0)
Hemoglobin: 12.2 g/dL (ref 12.0–15.0)
Immature Granulocytes: 0 %
Lymphocytes Relative: 41 %
Lymphs Abs: 2.1 10*3/uL (ref 0.7–4.0)
MCH: 30.1 pg (ref 26.0–34.0)
MCHC: 32.4 g/dL (ref 30.0–36.0)
MCV: 93.1 fL (ref 80.0–100.0)
Monocytes Absolute: 0.5 10*3/uL (ref 0.1–1.0)
Monocytes Relative: 9 %
Neutro Abs: 2.4 10*3/uL (ref 1.7–7.7)
Neutrophils Relative %: 46 %
Platelets: 240 10*3/uL (ref 150–400)
RBC: 4.05 MIL/uL (ref 3.87–5.11)
RDW: 11.9 % (ref 11.5–15.5)
WBC: 5.1 10*3/uL (ref 4.0–10.5)
nRBC: 0 % (ref 0.0–0.2)

## 2021-02-06 LAB — RAPID URINE DRUG SCREEN, HOSP PERFORMED
Amphetamines: NOT DETECTED
Barbiturates: NOT DETECTED
Benzodiazepines: NOT DETECTED
Cocaine: NOT DETECTED
Opiates: NOT DETECTED
Tetrahydrocannabinol: NOT DETECTED

## 2021-02-06 LAB — COMPREHENSIVE METABOLIC PANEL
ALT: 11 U/L (ref 0–44)
AST: 18 U/L (ref 15–41)
Albumin: 3.7 g/dL (ref 3.5–5.0)
Alkaline Phosphatase: 33 U/L — ABNORMAL LOW (ref 38–126)
Anion gap: 6 (ref 5–15)
BUN: 7 mg/dL (ref 6–20)
CO2: 25 mmol/L (ref 22–32)
Calcium: 9.1 mg/dL (ref 8.9–10.3)
Chloride: 105 mmol/L (ref 98–111)
Creatinine, Ser: 0.93 mg/dL (ref 0.44–1.00)
GFR, Estimated: >60 mL/min (ref >60)
Glucose, Bld: 154 mg/dL — ABNORMAL HIGH (ref 70–99)
Potassium: 3.6 mmol/L (ref 3.5–5.1)
Sodium: 136 mmol/L (ref 135–145)
Total Bilirubin: 0.8 mg/dL (ref 0.3–1.2)
Total Protein: 6.3 g/dL — ABNORMAL LOW (ref 6.5–8.1)

## 2021-02-06 LAB — LIPASE, BLOOD: Lipase: 48 U/L (ref 11–51)

## 2021-02-06 NOTE — ED Notes (Addendum)
Pt asked about the wait time this EMT told her the wait time is based on acuity although she has been here the longest. Pt states that she could not wait any longer. Pt was encouraged to stay and be seen by a doctor. Pt refused and handed over labels and blood pressure cuff and then walked out of the ED.

## 2021-02-24 ENCOUNTER — Encounter: Payer: Self-pay | Admitting: General Surgery

## 2021-03-03 ENCOUNTER — Emergency Department (HOSPITAL_COMMUNITY)
Admission: EM | Admit: 2021-03-03 | Discharge: 2021-03-04 | Disposition: A | Discharge status: Home / Self Care | Payer: Self-pay | Attending: Student | Admitting: Student

## 2021-03-03 ENCOUNTER — Ambulatory Visit: Payer: Self-pay

## 2021-03-03 ENCOUNTER — Other Ambulatory Visit: Payer: Self-pay

## 2021-03-03 ENCOUNTER — Encounter (HOSPITAL_COMMUNITY): Payer: Self-pay | Admitting: Emergency Medicine

## 2021-03-03 ENCOUNTER — Emergency Department (HOSPITAL_COMMUNITY): Payer: Self-pay

## 2021-03-03 DIAGNOSIS — Z5321 Procedure and treatment not carried out due to patient leaving prior to being seen by health care provider: Secondary | ICD-10-CM | POA: Insufficient documentation

## 2021-03-03 DIAGNOSIS — R1011 Right upper quadrant pain: Secondary | ICD-10-CM | POA: Insufficient documentation

## 2021-03-03 DIAGNOSIS — G8929 Other chronic pain: Secondary | ICD-10-CM | POA: Insufficient documentation

## 2021-03-03 DIAGNOSIS — R1084 Generalized abdominal pain: Secondary | ICD-10-CM | POA: Insufficient documentation

## 2021-03-03 DIAGNOSIS — R110 Nausea: Secondary | ICD-10-CM | POA: Insufficient documentation

## 2021-03-03 DIAGNOSIS — R109 Unspecified abdominal pain: Secondary | ICD-10-CM

## 2021-03-03 LAB — COMPREHENSIVE METABOLIC PANEL
ALT: 12 U/L (ref 0–44)
AST: 24 U/L (ref 15–41)
Albumin: 3.8 g/dL (ref 3.5–5.0)
Alkaline Phosphatase: 35 U/L — ABNORMAL LOW (ref 38–126)
Anion gap: 6 (ref 5–15)
BUN: 11 mg/dL (ref 6–20)
CO2: 24 mmol/L (ref 22–32)
Calcium: 9.5 mg/dL (ref 8.9–10.3)
Chloride: 104 mmol/L (ref 98–111)
Creatinine, Ser: 0.95 mg/dL (ref 0.44–1.00)
GFR, Estimated: >60 mL/min (ref >60)
Glucose, Bld: 100 mg/dL — ABNORMAL HIGH (ref 70–99)
Potassium: 4 mmol/L (ref 3.5–5.1)
Sodium: 134 mmol/L — ABNORMAL LOW (ref 135–145)
Total Bilirubin: 0.5 mg/dL (ref 0.3–1.2)
Total Protein: 6.7 g/dL (ref 6.5–8.1)

## 2021-03-03 LAB — CBC WITH DIFFERENTIAL/PLATELET
Abs Immature Granulocytes: 0.01 10*3/uL (ref 0.00–0.07)
Basophils Absolute: 0 10*3/uL (ref 0.0–0.1)
Basophils Relative: 0 %
Eosinophils Absolute: 0.1 10*3/uL (ref 0.0–0.5)
Eosinophils Relative: 1 %
HCT: 36.5 % (ref 36.0–46.0)
Hemoglobin: 12.3 g/dL (ref 12.0–15.0)
Immature Granulocytes: 0 %
Lymphocytes Relative: 44 %
Lymphs Abs: 2.8 10*3/uL (ref 0.7–4.0)
MCH: 30.9 pg (ref 26.0–34.0)
MCHC: 33.7 g/dL (ref 30.0–36.0)
MCV: 91.7 fL (ref 80.0–100.0)
Monocytes Absolute: 0.4 10*3/uL (ref 0.1–1.0)
Monocytes Relative: 7 %
Neutro Abs: 2.9 10*3/uL (ref 1.7–7.7)
Neutrophils Relative %: 48 %
Platelets: 243 10*3/uL (ref 150–400)
RBC: 3.98 MIL/uL (ref 3.87–5.11)
RDW: 12.3 % (ref 11.5–15.5)
WBC: 6.3 10*3/uL (ref 4.0–10.5)
nRBC: 0 % (ref 0.0–0.2)

## 2021-03-03 LAB — LIPASE, BLOOD: Lipase: 40 U/L (ref 11–51)

## 2021-03-03 LAB — I-STAT BETA HCG BLOOD, ED (MC, WL, AP ONLY): I-stat hCG, quantitative: <5 m[IU]/mL (ref <5)

## 2021-03-03 LAB — URINALYSIS, ROUTINE W REFLEX MICROSCOPIC
Bilirubin Urine: NEGATIVE
Glucose, UA: NEGATIVE mg/dL
Hgb urine dipstick: NEGATIVE
Ketones, ur: 5 mg/dL — AB
Leukocytes,Ua: NEGATIVE
Nitrite: NEGATIVE
Protein, ur: NEGATIVE mg/dL
Specific Gravity, Urine: 1.019 (ref 1.005–1.030)
pH: 5 (ref 5.0–8.0)

## 2021-03-03 NOTE — ED Triage Notes (Signed)
Patient reports chronic generalized abdominal pain with occasional nausea , no emesis or diarrhea , denies fever or chills .

## 2021-03-03 NOTE — ED Provider Notes (Signed)
Emergency Medicine Provider Triage Evaluation Note  Natalie Thornton , a 35 y.o. female  was evaluated in triage.  Pt complains of abdominal pain x 1 month. Pain is primarily to the epigastrium, it is constant, worse if she eats/drinks anything, no alleviating factors. Trying tylenol/motrin w/o relief. No recent N/V/D, melena, dysuria, or vaginal bleeding/discharge.   Review of Systems  Positive: Abdominal pain Negative: N/V/D, melena, dysuria, vagina bleeding/discharge  Physical Exam  BP 140/80 (BP Location: Right Arm)   Pulse 97   Temp 98.7 F (37.1 C) (Oral)   Resp 18   Ht 4\' 11"  (1.499 m)   Wt 65 kg   LMP 02/10/2021   SpO2 98%   BMI 28.94 kg/m  Gen:   Awake, no distress   Resp:  Normal effort  MSK:   Moves extremities without difficulty  Other:  Epigastric tenderness to palpation. No peritoneal signs. Negative murphys.   Medical Decision Making  Medically screening exam initiated at 10:31 PM.  Appropriate orders placed.  Natalie Thornton was informed that the remainder of the evaluation will be completed by another provider, this initial triage assessment does not replace that evaluation, and the importance of remaining in the ED until their evaluation is complete.  Abdominal pain.    Leafy Kindle 03/03/21 2234    Drenda Freeze, MD 03/03/21 2249

## 2021-03-03 NOTE — Telephone Encounter (Signed)
Pt. Reports she has been having abdominal pain x 2 weeks. Went to ED 02/05/21 but had to leave. Pain is around belly button and across abdomen. No radiation, no other symptoms. Requests to be seen in office. Advise dto go back to ED for worsening of symptoms.No answer on FC line . Please advise pt.    Reason for Disposition  [1] MODERATE pain (e.g., interferes with normal activities) AND [2] pain comes and goes (cramps) AND [3] present > 24 hours  (Exception: pain with Vomiting or Diarrhea - see that Guideline)  Answer Assessment - Initial Assessment Questions 1. LOCATION: "Where does it hurt?"      Around belly button 2. RADIATION: "Does the pain shoot anywhere else?" (e.g., chest, back)     No 3. ONSET: "When did the pain begin?" (e.g., minutes, hours or days ago)       2 weeks 4. SUDDEN: "Gradual or sudden onset?"     Sudden 5. PATTERN "Does the pain come and go, or is it constant?"    - If constant: "Is it getting better, staying the same, or worsening?"      (Note: Constant means the pain never goes away completely; most serious pain is constant and it progresses)     - If intermittent: "How long does it last?" "Do you have pain now?"     (Note: Intermittent means the pain goes away completely between bouts)     Constant 6. SEVERITY: "How bad is the pain?"  (e.g., Scale 1-10; mild, moderate, or severe)   - MILD (1-3): doesn't interfere with normal activities, abdomen soft and not tender to touch    - MODERATE (4-7): interferes with normal activities or awakens from sleep, abdomen tender to touch    - SEVERE (8-10): excruciating pain, doubled over, unable to do any normal activities      10 7. RECURRENT SYMPTOM: "Have you ever had this type of stomach pain before?" If Yes, ask: "When was the last time?" and "What happened that time?"      No 8. CAUSE: "What do you think is causing the stomach pain?"     Unsure 9. RELIEVING/AGGRAVATING FACTORS: "What makes it better or worse?" (e.g.,  movement, antacids, bowel movement)     No 10. OTHER SYMPTOMS: "Do you have any other symptoms?" (e.g., back pain, diarrhea, fever, urination pain, vomiting)       No 11. PREGNANCY: "Is there any chance you are pregnant?" "When was your last menstrual period?"       No  Protocols used: Abdominal Pain - [facility]

## 2021-03-04 ENCOUNTER — Emergency Department (HOSPITAL_COMMUNITY)
Admission: EM | Admit: 2021-03-04 | Discharge: 2021-03-04 | Disposition: A | Discharge status: Home / Self Care | Payer: Self-pay | Attending: Emergency Medicine | Admitting: Emergency Medicine

## 2021-03-04 ENCOUNTER — Encounter (HOSPITAL_COMMUNITY): Payer: Self-pay | Admitting: Emergency Medicine

## 2021-03-04 ENCOUNTER — Ambulatory Visit: Payer: Self-pay | Admitting: *Deleted

## 2021-03-04 DIAGNOSIS — R1084 Generalized abdominal pain: Secondary | ICD-10-CM | POA: Insufficient documentation

## 2021-03-04 DIAGNOSIS — Z5321 Procedure and treatment not carried out due to patient leaving prior to being seen by health care provider: Secondary | ICD-10-CM | POA: Insufficient documentation

## 2021-03-04 NOTE — Telephone Encounter (Signed)
Spoke with patient and she stated she will call back. She received some test at the ED and will wait for those results.

## 2021-03-04 NOTE — ED Notes (Signed)
Pt not responding for vital recheck

## 2021-03-04 NOTE — ED Triage Notes (Signed)
Pt endorses generalized abd pain for a few weeks. States she had labs and [place] yesterday. Pain worse after eating and drinking.

## 2021-03-04 NOTE — Telephone Encounter (Signed)
Reason for Disposition  [1] SEVERE pain (e.g., excruciating) AND [2] present > 1 hour  Answer Assessment - Initial Assessment Questions 1. LOCATION: "Where does it hurt?"      Mid on down 2. RADIATION: "Does the pain shoot anywhere else?" (e.g., chest, back)     No 3. ONSET: "When did the pain begin?" (e.g., minutes, hours or days ago)      First of September, 3 weeks ago 4. SUDDEN: "Gradual or sudden onset?"     suddenly 5. PATTERN "Does the pain come and go, or is it constant?"    - If constant: "Is it getting better, staying the same, or worsening?"      (Note: Constant means the pain never goes away completely; most serious pain is constant and it progresses)     - If intermittent: "How long does it last?" "Do you have pain now?"     (Note: Intermittent means the pain goes away completely between bouts)     constant 6. SEVERITY: "How bad is the pain?"  (e.g., Scale 1-10; mild, moderate, or severe)   - MILD (1-3): doesn't interfere with normal activities, abdomen soft and not tender to touch    - MODERATE (4-7): interferes with normal activities or awakens from sleep, abdomen tender to touch    - SEVERE (8-10): excruciating pain, doubled over, unable to do any normal activities      10/10 Worse after eating, drinking, vomiting at times  2 xs. 7. RECURRENT SYMPTOM: "Have you ever had this type of stomach pain before?" If Yes, ask: "When was the last time?" and "What happened that time?"      no 8. CAUSE: "What do you think is causing the stomach pain?"     Unsure 9. RELIEVING/AGGRAVATING FACTORS: "What makes it better or worse?" (e.g., movement, antacids, bowel movement)     Eating, drinking 10. OTHER SYMPTOMS: "Do you have any other symptoms?" (e.g., back pain, diarrhea, fever, urination pain, vomiting)       Fever LGT off and on 99.5  Today 99.3 11. PREGNANCY: "Is there any chance you are pregnant?" "When was your last menstrual period?"       no  Protocols used: Abdominal Pain  - [facility] 2

## 2021-03-04 NOTE — ED Provider Notes (Signed)
Emergency Medicine Provider Triage Evaluation Note  Natalie Thornton , a 35 y.o. female  was evaluated in triage.  Pt complains of abdominal pain x 1 month. Patient has taken tylenol and Excedrin. Patient has not tried milk of magnesia or pepto bismol because they make her vomit. Patient denies dysuria, does endorse heavy menstrual periods, LMP beginning of September. Patient reports abdominal pain is worse after eating or drinking anything. Denies hematemesis or melena. Patient LWBS yesterday and had normal RUQ [place], and Labwork remarkable only for ketones in urine.  Review of Systems  Positive: As above Negative: As above  Physical Exam  BP 122/78 (BP Location: Right Arm)   Pulse 85   Temp 99.3 F (37.4 C) (Oral)   Resp 18   LMP 02/10/2021   SpO2 99%  Gen:   Awake, no distress   Resp:  Normal effort  MSK:   Moves extremities without difficulty  Other:  Diffusely tender across abdomen, worse in epigastrium and suprapubic region  Medical Decision Making  Medically screening exam initiated at 11:52 AM.  Appropriate orders placed.  STEPHENIE NAVEJAS was informed that the remainder of the evaluation will be completed by another provider, this initial triage assessment does not replace that evaluation, and the importance of remaining in the ED until their evaluation is complete.  Abdominal pain, nausea, vomiting   Dorien Chihuahua 03/04/21 1155    Gareth Morgan, MD 03/04/21 1255

## 2021-03-04 NOTE — ED Notes (Signed)
Pt called for vitals recheck. No response.

## 2021-03-04 NOTE — Telephone Encounter (Signed)
Pt reports 10/10 abdominal pain, onset 3 weeks ago. States pain is constant, does not radiate. Has had 2 episodes of vomiting last week. Also reports LGT off and on 99.3-99.5.  99.5 today. Denies diarrhea, no dysuria, no back pain. Pt was in ED today (1130) left due to wait. States they did do an [place] and blood work Set designer they were [place] but I never got to see a doctor." States she saw GI MD 'But not for this." Advised ED best place for eval. "I've been 3 times and never see a doctor. Assured pt NT would route to practice for PCPs review and final disposition.   Care advise given. Please advise: [phone]

## 2021-03-08 NOTE — Telephone Encounter (Signed)
Returned pt call and made aware of Dr. Wynetta Emery response. Pt states that she has been to ED 3 times and she work at nursing home. Made pt aware that their is [facility], [place]. Pt then asked about scheduling an appt here. I made pt aware that she is on Walk-in basis because of her No Show rate. Made pt aware that she had an appt with Dr. Wynetta Emery on 8/25 and she No showed. Pt then asked about the GI referral. Made pt aware that it looks like the last time she seen Gi was in April and she can call them to schedule an appt.

## 2021-03-08 NOTE — Telephone Encounter (Signed)
Will forward to provider  

## 2021-05-21 ENCOUNTER — Other Ambulatory Visit: Payer: Self-pay

## 2021-05-21 ENCOUNTER — Ambulatory Visit
Admission: EM | Admit: 2021-05-21 | Discharge: 2021-05-21 | Disposition: A | Discharge status: Home / Self Care | Payer: Self-pay | Attending: Internal Medicine | Admitting: Internal Medicine

## 2021-05-21 DIAGNOSIS — W57XXXA Bitten or stung by nonvenomous insect and other nonvenomous arthropods, initial encounter: Secondary | ICD-10-CM

## 2021-05-21 DIAGNOSIS — R21 Rash and other nonspecific skin eruption: Secondary | ICD-10-CM

## 2021-05-21 MED ORDER — HYDROCORTISONE 1 % EX CREA: TOPICAL_CREAM | CUTANEOUS | 0 refills | Status: DC

## 2021-05-21 NOTE — ED Triage Notes (Signed)
Pt c/o being bitten by bed bug about a week ago. States she has been putting creams on the area without relief. Lesions are located on face, neck, hands.

## 2021-05-21 NOTE — ED Provider Notes (Signed)
[facility]    CSN: [id number] Arrival date & time: 05/21/21  1558      History   Chief Complaint Chief Complaint  Patient presents with   Rash    HPI Natalie Thornton is a 35 y.o. female.   Patient presents with rash to right side of face, neck, hands that has been present for approximately 1 week.  Patient reports that she thinks that she was bitten by bedbugs as she saw a bug similar to this on her body.  Itching is present and has worsened over the past few days.  Denies any pain.  Denies any fevers.  Denies any other changes to the environment.  Patient has used over-the-counter creams with no improvement in symptoms.   Rash  Past Medical History:  Diagnosis Date   Anxiety    Arthritis    Chronic back pain    DDD (degenerative disc disease)    Degeneration of intervertebral disc of lumbar region 07/30/2019   Degenerative disc disease, cervical    Disc disease, degenerative, cervical    H pylori ulcer    H/O degenerative disc disease 2013   History of hiatal hernia    Low back pain    Numbness    Whole back    Pilonidal cyst     Patient Active Problem List   Diagnosis Date Noted   Epigastric pain 09/10/2020   Lipoma of back 06/05/2019   Lumbar foraminal stenosis 03/13/2019   Neck pain 01/18/2019   S/P excision of lipoma 09/12/2018   Chronic bilateral thoracic back pain 07/17/2018   DDD (degenerative disc disease), lumbar 06/14/2018   Anxiety and depression 01/06/2017   Cigarette smoker 10/20/2016   Solitary pulmonary nodule 10/20/2016    Past Surgical History:  Procedure Laterality Date   COLONOSCOPY     LIPOMA EXCISION Right 08/10/2018   Procedure: EXCISION LIPOMA, RIGHT;  Surgeon: Fredirick Maudlin, MD;  Location: [facility] ORS;  Service: General;  Laterality: Right;    OB History   No obstetric history on file.      Home Medications    Prior to Admission medications   Medication Sig Start Date End Date Taking? Authorizing Provider   hydrocortisone cream 1 % Apply to affected area 2 times daily 05/21/21  Yes Woody Kronberg, [place] E, [place]  dicyclomine (BENTYL) 10 MG capsule Take 1 capsule (10 mg total) by mouth 3 (three) times daily. 10/01/20   Noralyn Pick, NP  famotidine (PEPCID) 20 MG tablet Take 1 tablet (20 mg total) by mouth daily. 10/01/20   Noralyn Pick, NP  albuterol (VENTOLIN HFA) 108 (90 Base) MCG/ACT inhaler Inhale 2 puffs into the lungs every 2 (two) hours as needed for wheezing or shortness of breath (cough). Patient not taking: No sig reported 03/08/19 06/15/19  Muthersbaugh, Jarrett Soho, PA-C  omeprazole (PRILOSEC) 20 MG capsule Take 1 capsule (20 mg total) by mouth daily. Patient not taking: No sig reported 10/30/19 12/03/19  Argentina Donovan, PA-C  sucralfate (CARAFATE) 1 GM/10ML suspension Take 10 mLs (1 g total) by mouth 4 (four) times daily -  with meals and at bedtime. Patient not taking: No sig reported 06/09/19 06/15/19  Jaynee Eagles, PA-C    Family History Family History  Problem Relation Age of Onset   Healthy Mother    Healthy Father    AAA (abdominal aortic aneurysm) Maternal Grandmother    Leukemia Maternal Grandmother    Breast cancer Maternal Grandmother    Head & neck cancer  Paternal Grandmother        Back cancer   Lymphoma Paternal Grandmother    Arthritis Paternal Grandmother    Cirrhosis Maternal Uncle    Breast cancer Other    Arthritis Paternal Grandfather    Colon cancer Neg Hx    Esophageal cancer Neg Hx    Rectal cancer Neg Hx    Stomach cancer Neg Hx     Social History Social History   Tobacco Use   Smoking status: Some Days    Types: Cigars   Smokeless tobacco: Never   Tobacco comments:    BLACK AND MILDS. 2 pks per week  Vaping Use   Vaping Use: Never used  Substance Use Topics   Alcohol use: Yes    Comment: occasionally   Drug use: No     Allergies   Patient has no known allergies.   Review of Systems Review of Systems Per HPI  Physical  Exam Triage Vital Signs ED Triage Vitals  Enc Vitals Group     BP 05/21/21 1806 123/78     Pulse Rate 05/21/21 1806 70     Resp 05/21/21 1806 20     Temp 05/21/21 1806 98.4 F (36.9 C)     Temp Source 05/21/21 1806 Oral     SpO2 05/21/21 1806 98 %     Weight --      Height --      Head Circumference --      Peak Flow --      Pain Score 05/21/21 1813 0     Pain Loc --      Pain Edu? --      Excl. in [place]? --    No data found.  Updated Vital Signs BP 123/78 (BP Location: Left Arm)    Pulse 70    Temp 98.4 F (36.9 C) (Oral)    Resp 20    SpO2 98%   Visual Acuity Right Eye Distance:   Left Eye Distance:   Bilateral Distance:    Right Eye Near:   Left Eye Near:    Bilateral Near:     Physical Exam Constitutional:      General: She is not in acute distress.    Appearance: Normal appearance. She is not toxic-appearing or diaphoretic.  HENT:     Head: Normocephalic and atraumatic.  Eyes:     Extraocular Movements: Extraocular movements intact.     Conjunctiva/sclera: Conjunctivae normal.  Pulmonary:     Effort: Pulmonary effort is normal.  Skin:    General: Skin is warm and dry.     Findings: Rash present.     Comments: Patient has multiple singular raised, erythematous lesions present to right side of face/forehead, posterior neck, left dorsal surface of hand.  No purulent drainage noted.  Neurological:     General: No focal deficit present.     Mental Status: She is alert and oriented to person, place, and time. Mental status is at baseline.  Psychiatric:        Mood and Affect: Mood normal.        Behavior: Behavior normal.        Thought Content: Thought content normal.        Judgment: Judgment normal.     UC Treatments / Results  Labs (all labs ordered are listed, but only abnormal results are displayed) Labs Reviewed - No data to display  EKG   Radiology No results found.  Procedures Procedures (including critical  care time)  Medications  Ordered in UC Medications - No data to display  Initial Impression / Assessment and Plan / UC Course  I have reviewed the triage vital signs and the nursing notes.  Pertinent labs & imaging results that were available during my care of the patient were reviewed by me and considered in my medical decision making (see chart for details).    Rash is consistent with possible bug bites.  Will treat with hydrocortisone cream since face is involved.  Advised patient to wash all linens and clothing in hot water due to possibility of bedbugs.  Discussed strict return precautions.  Patient verbalized understanding and was agreeable with plan. Final Clinical Impressions(s) / UC Diagnoses   Final diagnoses:  Rash and nonspecific skin eruption  Bug bite, initial encounter     Discharge Instructions      You have been prescribed an ointment to apply to affected areas.  Please also wash all clothing and linen in very hot water in the washing machine.    ED Prescriptions     Medication Sig Dispense Auth. Provider   hydrocortisone cream 1 % Apply to affected area 2 times daily 15 g Teodora Medici, [place]      PDMP not reviewed this encounter.   Teodora Medici, [place] 05/21/21 [phone]

## 2021-05-21 NOTE — Discharge Instructions (Addendum)
You have been prescribed an ointment to apply to affected areas.  Please also wash all clothing and linen in very hot water in the washing machine.

## 2021-06-07 ENCOUNTER — Emergency Department (HOSPITAL_COMMUNITY): Payer: Self-pay

## 2021-06-07 ENCOUNTER — Emergency Department (HOSPITAL_COMMUNITY)
Admission: EM | Admit: 2021-06-07 | Discharge: 2021-06-08 | Discharge status: Against Medical Advice | Payer: Self-pay | Attending: Emergency Medicine | Admitting: Emergency Medicine

## 2021-06-07 DIAGNOSIS — R1110 Vomiting, unspecified: Secondary | ICD-10-CM | POA: Insufficient documentation

## 2021-06-07 DIAGNOSIS — Z5321 Procedure and treatment not carried out due to patient leaving prior to being seen by health care provider: Secondary | ICD-10-CM | POA: Insufficient documentation

## 2021-06-07 DIAGNOSIS — R109 Unspecified abdominal pain: Secondary | ICD-10-CM | POA: Insufficient documentation

## 2021-06-07 LAB — COMPREHENSIVE METABOLIC PANEL
ALT: 11 U/L (ref 0–44)
AST: 19 U/L (ref 15–41)
Albumin: 3.8 g/dL (ref 3.5–5.0)
Alkaline Phosphatase: 33 U/L — ABNORMAL LOW (ref 38–126)
Anion gap: 8 (ref 5–15)
BUN: 6 mg/dL (ref 6–20)
CO2: 22 mmol/L (ref 22–32)
Calcium: 9.1 mg/dL (ref 8.9–10.3)
Chloride: 104 mmol/L (ref 98–111)
Creatinine, Ser: 0.99 mg/dL (ref 0.44–1.00)
GFR, Estimated: >60 mL/min (ref >60)
Glucose, Bld: 110 mg/dL — ABNORMAL HIGH (ref 70–99)
Potassium: 4.3 mmol/L (ref 3.5–5.1)
Sodium: 134 mmol/L — ABNORMAL LOW (ref 135–145)
Total Bilirubin: 0.6 mg/dL (ref 0.3–1.2)
Total Protein: 6.6 g/dL (ref 6.5–8.1)

## 2021-06-07 LAB — URINALYSIS, ROUTINE W REFLEX MICROSCOPIC
Bilirubin Urine: NEGATIVE
Glucose, UA: NEGATIVE mg/dL
Hgb urine dipstick: NEGATIVE
Ketones, ur: NEGATIVE mg/dL
Nitrite: NEGATIVE
Protein, ur: NEGATIVE mg/dL
Specific Gravity, Urine: 1.024 (ref 1.005–1.030)
pH: 6 (ref 5.0–8.0)

## 2021-06-07 LAB — CBC
HCT: 42 % (ref 36.0–46.0)
Hemoglobin: 13.8 g/dL (ref 12.0–15.0)
MCH: 30.2 pg (ref 26.0–34.0)
MCHC: 32.9 g/dL (ref 30.0–36.0)
MCV: 91.9 fL (ref 80.0–100.0)
Platelets: 237 10*3/uL (ref 150–400)
RBC: 4.57 MIL/uL (ref 3.87–5.11)
RDW: 11.9 % (ref 11.5–15.5)
WBC: 4.5 10*3/uL (ref 4.0–10.5)
nRBC: 0 % (ref 0.0–0.2)

## 2021-06-07 LAB — I-STAT BETA HCG BLOOD, ED (MC, WL, AP ONLY): I-stat hCG, quantitative: <5 m[IU]/mL (ref <5)

## 2021-06-07 LAB — LIPASE, BLOOD: Lipase: 38 U/L (ref 11–51)

## 2021-06-07 MED ORDER — IOHEXOL 300 MG/ML  SOLN
100.0000 mL | Freq: Once | INTRAMUSCULAR | Status: AC | PRN
  Administered 2021-06-07: 100 mL via INTRAVENOUS

## 2021-06-07 NOTE — ED Notes (Signed)
PT opting to leave at this time. IV removed.

## 2021-06-07 NOTE — ED Triage Notes (Signed)
Patient complains of abdominal pain with intermittent emesis that started several weeks ago but states a few days ago the pain started to also radiate into her chest and she is experiences intermittent nausea. Patient also requests a CT scan to evaluate cysts on her ovaries and lesions on her liver. Patient states she pain she is feeling now is different than the pain she felt when she discovered she had the cysts and lesions. Patient alert, oriented, ambulatory, and in on apparent distress at this time.

## 2021-06-07 NOTE — ED Provider Notes (Signed)
Emergency Medicine Provider Triage Evaluation Note  Natalie Thornton , a 36 y.o. female  was evaluated in triage.  Pt complains of generalized abdominal pain over the last 2 weeks.  Intermittent in nature but has been worsening.  Reports associated nausea and vomiting.  Also reports fever.  No urinary symptoms, chest pain, shortness of breath.  Review of Systems  Positive:  Negative: See above   Physical Exam  BP 110/71 (BP Location: Right Arm)    Pulse 83    Temp 98.9 F (37.2 C) (Oral)    Resp 17    LMP  (Within Weeks)    SpO2 98%  Gen:   Awake, no distress   Resp:  Normal effort  MSK:   Moves extremities without difficulty  Other:  Generalized abdominal tenderness  Medical Decision Making  Medically screening exam initiated at 1:57 PM.  Appropriate orders placed.  NADEEN SHIPMAN was informed that the remainder of the evaluation will be completed by another provider, this initial triage assessment does not replace that evaluation, and the importance of remaining in the ED until their evaluation is complete.     Myna Bright [place], PA-C 06/07/21 1358    Godfrey Pick, MD 06/09/21 [phone]

## 2021-06-21 ENCOUNTER — Encounter (HOSPITAL_COMMUNITY): Payer: Self-pay

## 2021-06-21 ENCOUNTER — Other Ambulatory Visit: Payer: Self-pay

## 2021-06-21 ENCOUNTER — Emergency Department (HOSPITAL_COMMUNITY): Payer: Self-pay

## 2021-06-21 ENCOUNTER — Emergency Department (HOSPITAL_COMMUNITY)
Admission: EM | Admit: 2021-06-21 | Discharge: 2021-06-21 | Disposition: A | Discharge status: Home / Self Care | Payer: Self-pay | Attending: Emergency Medicine | Admitting: Emergency Medicine

## 2021-06-21 DIAGNOSIS — N898 Other specified noninflammatory disorders of vagina: Secondary | ICD-10-CM | POA: Insufficient documentation

## 2021-06-21 DIAGNOSIS — R1084 Generalized abdominal pain: Secondary | ICD-10-CM

## 2021-06-21 DIAGNOSIS — D259 Leiomyoma of uterus, unspecified: Secondary | ICD-10-CM

## 2021-06-21 DIAGNOSIS — R109 Unspecified abdominal pain: Secondary | ICD-10-CM | POA: Insufficient documentation

## 2021-06-21 LAB — I-STAT BETA HCG BLOOD, ED (MC, WL, AP ONLY): I-stat hCG, quantitative: <5 m[IU]/mL (ref <5)

## 2021-06-21 LAB — COMPREHENSIVE METABOLIC PANEL
ALT: 17 U/L (ref 0–44)
AST: 19 U/L (ref 15–41)
Albumin: 4.3 g/dL (ref 3.5–5.0)
Alkaline Phosphatase: 30 U/L — ABNORMAL LOW (ref 38–126)
Anion gap: 6 (ref 5–15)
BUN: <5 mg/dL — ABNORMAL LOW (ref 6–20)
CO2: 27 mmol/L (ref 22–32)
Calcium: 8.9 mg/dL (ref 8.9–10.3)
Chloride: 103 mmol/L (ref 98–111)
Creatinine, Ser: 0.84 mg/dL (ref 0.44–1.00)
GFR, Estimated: >60 mL/min (ref >60)
Glucose, Bld: 104 mg/dL — ABNORMAL HIGH (ref 70–99)
Potassium: 4 mmol/L (ref 3.5–5.1)
Sodium: 136 mmol/L (ref 135–145)
Total Bilirubin: 0.5 mg/dL (ref 0.3–1.2)
Total Protein: 7.1 g/dL (ref 6.5–8.1)

## 2021-06-21 LAB — URINALYSIS, ROUTINE W REFLEX MICROSCOPIC
Bilirubin Urine: NEGATIVE
Glucose, UA: NEGATIVE mg/dL
Hgb urine dipstick: NEGATIVE
Ketones, ur: NEGATIVE mg/dL
Nitrite: NEGATIVE
Protein, ur: NEGATIVE mg/dL
Specific Gravity, Urine: 1.019 (ref 1.005–1.030)
pH: 5 (ref 5.0–8.0)

## 2021-06-21 LAB — CBC
HCT: 40.7 % (ref 36.0–46.0)
Hemoglobin: 13.4 g/dL (ref 12.0–15.0)
MCH: 30.3 pg (ref 26.0–34.0)
MCHC: 32.9 g/dL (ref 30.0–36.0)
MCV: 92.1 fL (ref 80.0–100.0)
Platelets: 298 10*3/uL (ref 150–400)
RBC: 4.42 MIL/uL (ref 3.87–5.11)
RDW: 11.9 % (ref 11.5–15.5)
WBC: 3 10*3/uL — ABNORMAL LOW (ref 4.0–10.5)
nRBC: 0 % (ref 0.0–0.2)

## 2021-06-21 LAB — LIPASE, BLOOD: Lipase: 39 U/L (ref 11–51)

## 2021-06-21 MED ORDER — MORPHINE SULFATE (PF) 4 MG/ML IV SOLN
4.0000 mg | Freq: Once | INTRAVENOUS | Status: AC
  Administered 2021-06-21: 4 mg via INTRAVENOUS
  Filled 2021-06-21: qty 1

## 2021-06-21 MED ORDER — ONDANSETRON HCL 4 MG/2ML IJ SOLN
4.0000 mg | Freq: Once | INTRAMUSCULAR | Status: AC
  Administered 2021-06-21: 4 mg via INTRAVENOUS
  Filled 2021-06-21: qty 2

## 2021-06-21 MED ORDER — IOHEXOL 300 MG/ML  SOLN
100.0000 mL | Freq: Once | INTRAMUSCULAR | Status: AC | PRN
  Administered 2021-06-21: 100 mL via INTRAVENOUS

## 2021-06-21 MED ORDER — SODIUM CHLORIDE 0.9 % IV BOLUS
1000.0000 mL | Freq: Once | INTRAVENOUS | Status: AC
  Administered 2021-06-21: 1000 mL via INTRAVENOUS

## 2021-06-21 MED ORDER — NAPROXEN 375 MG PO TABS: 375.0000 mg | ORAL_TABLET | Freq: Two times a day (BID) | ORAL | 0 refills | Status: DC

## 2021-06-21 NOTE — ED Triage Notes (Signed)
Patient c/o mid abdominal pain x 2 weeks. Patient also c/o intermittent N/V. Patient also c/o headaches and heavy periods.

## 2021-06-21 NOTE — Discharge Instructions (Addendum)
Your work-up today showed you have a uterine fibroid as consistent with previous work-up.  No new or concerning cause of your abdominal pain.  I have sent in naproxen to the pharmacy for you to take and provided you with GYN follow-up.  Please call GYN tomorrow to schedule an appointment for further work-up and management.  If you have worsening abdominal pain, nausea vomiting to the point you are unable to keep any food or drink down please return to the emergency room for evaluation. He also had evidence of constipation on your CT scan.  Recommend you buy over-the-counter laxative and establish a regular bowel regimen.

## 2021-06-21 NOTE — ED Provider Triage Note (Signed)
Emergency Medicine Provider Triage Evaluation Note  BLESSED GIRDNER , a 36 y.o. female  was evaluated in triage.  Pt complains of abdominal pain that is constant and severe for the past couple weeks.  Reports history of same domino pain associated with heavy menstrual bleeding, constipation.  Denies dysuria, fever but endorses intermittent nausea and vomiting..  Review of Systems  Positive: As above Negative: As above  Physical Exam  BP 127/74 (BP Location: Left Arm)    Pulse 76    Temp 98.3 F (36.8 C) (Oral)    Resp 18    Ht 4\' 11"  (1.499 m)    Wt 60.3 kg    LMP 06/11/2021 (Approximate)    SpO2 96%    BMI 26.86 kg/m  Gen:   Awake, no distress   Resp:  Normal effort  MSK:   Moves extremities without difficulty  Other:  Mild tenderness to palpation present over abdomen diffusely  Medical Decision Making  Medically screening exam initiated at 12:16 PM.  Appropriate orders placed.  BRITTNAY PIGMAN was informed that the remainder of the evaluation will be completed by another provider, this initial triage assessment does not replace that evaluation, and the importance of remaining in the ED until their evaluation is complete.     Evlyn Courier, PA-C 06/21/21 1217

## 2021-06-21 NOTE — ED Provider Notes (Signed)
[place] DEPT Provider Note   CSN: [id number] Arrival date & time: 06/21/21  1122     History  Chief Complaint  Patient presents with   Abdominal Pain    Natalie Thornton is a 36 y.o. female.  36 year old female with history of uterine fibroid presents today for evaluation of abdominal pain, irregular menstrual cycles of multiple week duration.  Patient reports she has been to [facility] but left before being evaluated.  She reports her symptoms are persistent and she rates her pain as a 10/10.  She denies fever, chills, vomiting, vaginal discharge.    Abdominal Pain Associated symptoms: no chills, no cough, no fever, no hematuria, no nausea, no shortness of breath, no vaginal discharge and no vomiting       Home Medications Prior to Admission medications   Medication Sig Start Date End Date Taking? Authorizing Provider  dicyclomine (BENTYL) 10 MG capsule Take 1 capsule (10 mg total) by mouth 3 (three) times daily. 10/01/20   Noralyn Pick, NP  famotidine (PEPCID) 20 MG tablet Take 1 tablet (20 mg total) by mouth daily. 10/01/20   Noralyn Pick, NP  hydrocortisone cream 1 % Apply to affected area 2 times daily 05/21/21   Teodora Medici, FNP  albuterol (VENTOLIN HFA) 108 (90 Base) MCG/ACT inhaler Inhale 2 puffs into the lungs every 2 (two) hours as needed for wheezing or shortness of breath (cough). Patient not taking: No sig reported 03/08/19 06/15/19  Muthersbaugh, Jarrett Soho, PA-C  omeprazole (PRILOSEC) 20 MG capsule Take 1 capsule (20 mg total) by mouth daily. Patient not taking: No sig reported 10/30/19 12/03/19  Argentina Donovan, PA-C  sucralfate (CARAFATE) 1 GM/10ML suspension Take 10 mLs (1 g total) by mouth 4 (four) times daily -  with meals and at bedtime. Patient not taking: No sig reported 06/09/19 06/15/19  Jaynee Eagles, PA-C      Allergies    Patient has no known allergies.    Review of Systems   Review of Systems   Constitutional:  Negative for activity change, chills and fever.  Respiratory:  Negative for cough and shortness of breath.   Gastrointestinal:  Positive for abdominal pain. Negative for abdominal distention, nausea and vomiting.  Genitourinary:  Negative for difficulty urinating, hematuria and vaginal discharge.  Neurological:  Negative for weakness and light-headedness.  All other systems reviewed and are negative.  Physical Exam Updated Vital Signs BP 116/69    Pulse 70    Temp 98.9 F (37.2 C) (Oral)    Resp 16    Ht 4\' 11"  (1.499 m)    Wt 60.3 kg    LMP 06/11/2021 (Approximate)    SpO2 100%    BMI 26.86 kg/m  Physical Exam Vitals and nursing note reviewed.  Constitutional:      General: She is not in acute distress.    Appearance: Normal appearance. She is not ill-appearing.  HENT:     Head: Normocephalic and atraumatic.     Nose: Nose normal.  Eyes:     General: No scleral icterus.    Extraocular Movements: Extraocular movements intact.     Conjunctiva/sclera: Conjunctivae normal.  Cardiovascular:     Rate and Rhythm: Normal rate and regular rhythm.     Pulses: Normal pulses.     Heart sounds: Normal heart sounds.  Pulmonary:     Effort: Pulmonary effort is normal. No respiratory distress.     Breath sounds: Normal breath sounds. No wheezing  or rales.  Abdominal:     General: There is no distension.     Tenderness: There is no abdominal tenderness.  Musculoskeletal:        General: Normal range of motion.     Cervical back: Normal range of motion.  Skin:    General: Skin is warm and dry.  Neurological:     General: No focal deficit present.     Mental Status: She is alert. Mental status is at baseline.    ED Results / Procedures / Treatments   Labs (all labs ordered are listed, but only abnormal results are displayed) Labs Reviewed  COMPREHENSIVE METABOLIC PANEL - Abnormal; Notable for the following components:      Result Value   Glucose, Bld 104 (*)    BUN  <5 (*)    Alkaline Phosphatase 30 (*)    All other components within normal limits  CBC - Abnormal; Notable for the following components:   WBC 3.0 (*)    All other components within normal limits  URINALYSIS, ROUTINE W REFLEX MICROSCOPIC - Abnormal; Notable for the following components:   APPearance HAZY (*)    Leukocytes,Ua SMALL (*)    Bacteria, UA RARE (*)    All other components within normal limits  LIPASE, BLOOD  I-STAT BETA HCG BLOOD, ED ([facility], [facility], [facility] ONLY)    EKG None  Radiology No results found.  Procedures Procedures    Medications Ordered in ED Medications  morphine 4 MG/ML injection 4 mg (has no administration in time range)  ondansetron (ZOFRAN) injection 4 mg (has no administration in time range)  sodium chloride 0.9 % bolus 1,000 mL (has no administration in time range)    ED Course/ Medical Decision Making/ A&P                           Medical Decision Making  Medical Decision Making / ED Course   This patient presents to the ED for concern of abdominal pain, this involves an extensive number of treatment options, and is a complaint that carries with it a high risk of complications and morbidity.  The differential diagnosis includes appendicitis, diverticulitis, cholecystitis, UTI, STI,  MDM: 36 year old female presents today for evaluation of persistent abdominal pain which she rates as a 10/10 without associated nausea, vomiting.  She does have history of uterine fibroid but has not followed up with GYN regarding this.  Patient's abdomen is soft and nondistended however she exhibits diffuse abdominal tenderness.  Initial work-up with mild leukopenia at 3.0, CMP with glucose 104 otherwise unremarkable, lipase within normal limits UA without UTI.  Patient is afebrile low concern that this is infectious given presentation and duration of symptoms.  CT abdomen pelvis without acute intra-abdominal process.  It does show uterine fibroid.  Given patient was  diagnosed with this but she has not followed up with GYN will need referral.  Discussed the use of NSAIDs for symptom management.   Lab Tests: -I ordered, reviewed, and interpreted labs.   The pertinent results include:   Labs Reviewed  COMPREHENSIVE METABOLIC PANEL - Abnormal; Notable for the following components:      Result Value   Glucose, Bld 104 (*)    BUN <5 (*)    Alkaline Phosphatase 30 (*)    All other components within normal limits  CBC - Abnormal; Notable for the following components:   WBC 3.0 (*)    All other components within  normal limits  URINALYSIS, ROUTINE W REFLEX MICROSCOPIC - Abnormal; Notable for the following components:   APPearance HAZY (*)    Leukocytes,Ua SMALL (*)    Bacteria, UA RARE (*)    All other components within normal limits  LIPASE, BLOOD  I-STAT BETA HCG BLOOD, ED ([facility], [facility], [facility] ONLY)      EKG  EKG Interpretation  Date/Time:    Ventricular Rate:    PR Interval:    QRS Duration:   QT Interval:    QTC Calculation:   R Axis:     Text Interpretation:           Imaging Studies ordered: I ordered imaging studies including CT abdomen pelvis with contrast I independently visualized and interpreted imaging. I agree with the radiologist interpretation   Medicines ordered and prescription drug management: Meds ordered this encounter  Medications   morphine 4 MG/ML injection 4 mg   ondansetron (ZOFRAN) injection 4 mg   sodium chloride 0.9 % bolus 1,000 mL    -I have reviewed the patients home medicines and have made adjustments as needed  Reevaluation: After the interventions noted above, I reevaluated the patient and found that they have : Mild improvement after IV fluids and pain medicine.   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 Follow-up and naproxen provided.  Patient voices understanding and is in agreement with plan.   Final Clinical Impression(s) / ED Diagnoses Final diagnoses:  None    Rx / DC Orders ED Discharge Orders     None         Evlyn Courier, PA-C 43/32/95 [id number]    Gray, [place] P, DO 16/60/63 2353

## 2021-08-11 ENCOUNTER — Ambulatory Visit (INDEPENDENT_AMBULATORY_CARE_PROVIDER_SITE_OTHER): Payer: Self-pay | Admitting: Obstetrics

## 2021-08-11 ENCOUNTER — Other Ambulatory Visit: Payer: Self-pay

## 2021-08-11 ENCOUNTER — Encounter: Payer: Self-pay | Admitting: Obstetrics

## 2021-08-11 VITALS — BP 122/79 | HR 85 | Wt 132.0 lb

## 2021-08-11 DIAGNOSIS — N946 Dysmenorrhea, unspecified: Secondary | ICD-10-CM

## 2021-08-11 DIAGNOSIS — N939 Abnormal uterine and vaginal bleeding, unspecified: Secondary | ICD-10-CM

## 2021-08-11 DIAGNOSIS — D251 Intramural leiomyoma of uterus: Secondary | ICD-10-CM

## 2021-08-11 MED ORDER — NAPROXEN 500 MG PO TBEC: 500.0000 mg | DELAYED_RELEASE_TABLET | Freq: Two times a day (BID) | ORAL | 5 refills | Status: DC

## 2021-08-11 NOTE — Progress Notes (Signed)
Pt states last pap last year. ?Pt is here today for eval of fibroids.  Pt states she has issues with heavy lengthy bleeding with cramping. ? ?

## 2021-08-11 NOTE — Progress Notes (Signed)
Patient ID: Natalie Thornton, female   DOB: 08/30/1985, 36 y.o.   MRN: [id number] ? ?Chief Complaint  ?Patient presents with  ? New Patient (Initial Visit)  ? Gynecologic Exam  ? ? ?HPI ?Natalie Thornton is a 36 y.o. female.  Complains of heavy and painful periods.  Wants a hysterectomy. ?HPI ? ?Past Medical History:  ?Diagnosis Date  ? Anxiety   ? Arthritis   ? Chronic back pain   ? DDD (degenerative disc disease)   ? 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 Degenerative disc disease, cervical   ? Disc disease, degenerative, cervical   ? H pylori ulcer   ? H/O degenerative disc disease 2013  ? History of hiatal hernia   ? Low back pain   ? Numbness   ? Whole back   ? Pilonidal cyst   ? ? ?Past Surgical History:  ?Procedure Laterality Date  ? COLONOSCOPY    ? LIPOMA EXCISION Right 08/10/2018  ? Procedure: EXCISION LIPOMA, RIGHT;  Surgeon: Fredirick Maudlin, MD;  Location: [facility] ORS;  Service: General;  Laterality: Right;  ? ? ?Family History  ?Problem Relation Age of Onset  ? Arthritis Paternal Grandfather   ? Head & neck cancer Paternal Grandmother   ?     Back cancer  ? Lymphoma Paternal Grandmother   ? Arthritis Paternal Grandmother   ? AAA (abdominal aortic aneurysm) Maternal Grandmother   ? Leukemia Maternal Grandmother   ? Breast cancer Maternal Grandmother   ? Healthy Father   ? Hypertension Mother   ? Healthy Mother   ? Cirrhosis Maternal Uncle   ? Breast cancer Other   ? Colon cancer Neg Hx   ? Esophageal cancer Neg Hx   ? Rectal cancer Neg Hx   ? Stomach cancer Neg Hx   ? ? ?Social History ?Social History  ? ?Tobacco Use  ? Smoking status: Some Days  ?  Packs/day: 1.00  ?  Types: Cigars, Cigarettes  ? Smokeless tobacco: Never  ?Vaping Use  ? Vaping Use: Never used  ?Substance Use Topics  ? Alcohol use: Yes  ?  Comment: occasionally  ? Drug use: No  ? ? ?No Known Allergies ? ?Current Outpatient Medications  ?Medication Sig Dispense Refill  ? naproxen (EC-NAPROSYN) 500 MG EC tablet Take 1  tablet (500 mg total) by mouth 2 (two) times daily with a meal. 30 tablet 5  ? dicyclomine (BENTYL) 10 MG capsule Take 1 capsule (10 mg total) by mouth 3 (three) times daily. 60 capsule 0  ? famotidine (PEPCID) 20 MG tablet Take 1 tablet (20 mg total) by mouth daily. 30 tablet 2  ? hydrocortisone cream 1 % Apply to affected area 2 times daily 15 g 0  ? ?No current facility-administered medications for this visit.  ? ? ?Review of Systems ?Review of Systems ?Constitutional: negative for fatigue and weight loss ?Respiratory: negative for cough and wheezing ?Cardiovascular: negative for chest pain, fatigue and palpitations ?Gastrointestinal: negative for abdominal pain and change in bowel habits ?Genitourinary: positive for AUB ?Integument/breast: negative for nipple discharge ?Musculoskeletal:negative for myalgias ?Neurological: negative for gait problems and tremors ?Behavioral/Psych: negative for abusive relationship, depression ?Endocrine: negative for temperature intolerance    ?  ?Blood pressure 122/79, pulse 85, weight 132 lb (59.9 kg), last menstrual period 08/10/2021. ? ?Physical Exam ?Physical Exam ?General:   Alert and no distress  ?Skin:   no rash or abnormalities  ?Lungs:   clear to auscultation bilaterally  ?  Heart:   regular rate and rhythm, S1, S2 normal, no murmur, click, rub or gallop  ?Breasts:   normal without suspicious masses, skin or nipple changes or axillary nodes  ?Abdomen:  normal findings: no organomegaly, soft, non-tender and no hernia  ?Pelvis:  External genitalia: normal general appearance ?Urinary system: urethral meatus normal and bladder without fullness, nontender ?Vaginal: normal without tenderness, induration or masses ?Cervix: normal appearance ?Adnexa: normal bimanual exam ?Uterus: anteverted and non-tender, normal size  ?  ?I have spent a total of 20 minutes of face-to-face time, excluding clinical staff time, reviewing notes and preparing to see patient, ordering tests and/or  medications, and counseling the patient.  ? ?Data Reviewed ?Labs ?Ultrasound ? ?Assessment  ?   ?1. Abnormal uterine bleeding (AUB) ?- will follow clinically ?- wants definitive surgical management  ? ?2. Fibroids, intramural ? ?3. Severe dysmenorrhea ?Rx: ?- naproxen (EC-NAPROSYN) 500 MG EC tablet; Take 1 tablet (500 mg total) by mouth 2 (two) times daily with a meal.  Dispense: 30 tablet; Refill: 5  ?  ? ?Plan ?  Follow up in 2 weeks with Dr. Rip Harbour for surgical consult ? ?No orders of the defined types were placed in this encounter. ? ?Meds ordered this encounter  ?Medications  ? naproxen (EC-NAPROSYN) 500 MG EC tablet  ?  Sig: Take 1 tablet (500 mg total) by mouth 2 (two) times daily with a meal.  ?  Dispense:  30 tablet  ?  Refill:  5  ? ?  ? ?Shelly Bombard, MD ?08/11/2021 2:48 PM  ?

## 2021-09-03 ENCOUNTER — Other Ambulatory Visit: Payer: Self-pay

## 2021-09-03 ENCOUNTER — Encounter (HOSPITAL_COMMUNITY): Payer: Self-pay

## 2021-09-03 ENCOUNTER — Emergency Department (HOSPITAL_COMMUNITY)
Admission: EM | Admit: 2021-09-03 | Discharge: 2021-09-03 | Disposition: A | Discharge status: Home / Self Care | Payer: Medicaid Other | Attending: Emergency Medicine | Admitting: Emergency Medicine

## 2021-09-03 DIAGNOSIS — X58XXXA Exposure to other specified factors, initial encounter: Secondary | ICD-10-CM | POA: Insufficient documentation

## 2021-09-03 DIAGNOSIS — S025XXA Fracture of tooth (traumatic), initial encounter for closed fracture: Secondary | ICD-10-CM | POA: Insufficient documentation

## 2021-09-03 DIAGNOSIS — S025XXB Fracture of tooth (traumatic), initial encounter for open fracture: Secondary | ICD-10-CM

## 2021-09-03 MED ORDER — KETOROLAC TROMETHAMINE 60 MG/2ML IM SOLN
30.0000 mg | Freq: Once | INTRAMUSCULAR | Status: AC
  Administered 2021-09-03: 30 mg via INTRAMUSCULAR
  Filled 2021-09-03: qty 2

## 2021-09-03 MED ORDER — AMOXICILLIN-POT CLAVULANATE 875-125 MG PO TABS: 1.0000 | ORAL_TABLET | Freq: Two times a day (BID) | ORAL | 0 refills | Status: DC

## 2021-09-03 MED ORDER — AMOXICILLIN-POT CLAVULANATE 875-125 MG PO TABS
1.0000 | ORAL_TABLET | Freq: Two times a day (BID) | ORAL | 0 refills | Status: DC
Start: 2021-09-03 — End: 2021-09-03

## 2021-09-03 MED ORDER — OXYCODONE-ACETAMINOPHEN 5-325 MG PO TABS
1.0000 | ORAL_TABLET | Freq: Four times a day (QID) | ORAL | 0 refills | Status: DC | PRN
Start: 2021-09-03 — End: 2021-12-29

## 2021-09-03 MED ORDER — OXYCODONE-ACETAMINOPHEN 5-325 MG PO TABS
1.0000 | ORAL_TABLET | Freq: Four times a day (QID) | ORAL | 0 refills | Status: DC | PRN
Start: 2021-09-03 — End: 2021-09-03

## 2021-09-03 MED ORDER — AMOXICILLIN-POT CLAVULANATE 875-125 MG PO TABS
1.0000 | ORAL_TABLET | Freq: Once | ORAL | Status: AC
Start: 2021-09-03 — End: 2021-09-03
  Administered 2021-09-03: 1 via ORAL
  Filled 2021-09-03: qty 1

## 2021-09-03 MED ORDER — OXYCODONE-ACETAMINOPHEN 5-325 MG PO TABS
1.0000 | ORAL_TABLET | Freq: Once | ORAL | Status: AC
  Administered 2021-09-03: 1 via ORAL
  Filled 2021-09-03: qty 1

## 2021-09-03 NOTE — ED Provider Notes (Signed)
?[facility] DEPT ?Provider Note ? ? ?CSN: [id number] ?Arrival date & time: 09/03/21  1933 ? ?  ? ?History ? ?Chief Complaint  ?Patient presents with  ? Dental Pain  ? ? ?Natalie Thornton is a 36 y.o. female who presents to the ED today for evaluation of right mandibular pain from suspect tooth fracture that occurred while eating peanuts earlier today.  Since then she has had significant pain of the affected tooth extending down into her jaw and up into her head.  Pain is constant, difficult to eat or drink.  No treatment prior to arrival.  Patient does not have a dentist.  She denies fever, chills, and all other symptoms. ? ?Dental Pain ? ?  ? ?Home Medications ?Prior to Admission medications   ?Medication Sig Start Date End Date Taking? Authorizing Provider  ?amoxicillin-clavulanate (AUGMENTIN) 875-125 MG tablet Take 1 tablet by mouth every 12 (twelve) hours. 09/03/21   Tonye Pearson, PA-C  ?dicyclomine (BENTYL) 10 MG capsule Take 1 capsule (10 mg total) by mouth 3 (three) times daily. 10/01/20   Noralyn Pick, NP  ?famotidine (PEPCID) 20 MG tablet Take 1 tablet (20 mg total) by mouth daily. 10/01/20   Noralyn Pick, NP  ?hydrocortisone cream 1 % Apply to affected area 2 times daily 05/21/21   Teodora Medici, FNP  ?naproxen (EC-NAPROSYN) 500 MG EC tablet Take 1 tablet (500 mg total) by mouth 2 (two) times daily with a meal. 08/11/21   Shelly Bombard, MD  ?oxyCODONE-acetaminophen (PERCOCET/ROXICET) 5-325 MG tablet Take 1 tablet by mouth every 6 (six) hours as needed for severe pain. 09/03/21   Tonye Pearson, PA-C  ?albuterol (VENTOLIN HFA) 108 (90 Base) MCG/ACT inhaler Inhale 2 puffs into the lungs every 2 (two) hours as needed for wheezing or shortness of breath (cough). ?Patient not taking: No sig reported 03/08/19 06/15/19  Muthersbaugh, Jarrett Soho, PA-C  ?omeprazole (PRILOSEC) 20 MG capsule Take 1 capsule (20 mg total) by mouth daily. ?Patient not taking: No sig  reported 10/30/19 12/03/19  Argentina Donovan, PA-C  ?sucralfate (CARAFATE) 1 GM/10ML suspension Take 10 mLs (1 g total) by mouth 4 (four) times daily -  with meals and at bedtime. ?Patient not taking: No sig reported 06/09/19 06/15/19  Jaynee Eagles, PA-C  ?   ? ?Allergies    ?Patient has no known allergies.   ? ?Review of Systems   ?Review of Systems ? ?Physical Exam ?Updated Vital Signs ?BP (!) 142/60 (BP Location: Left Arm)   Pulse 77   Temp 98.3 ?F (36.8 ?C) (Oral)   Resp 18   Ht '4\' 11"'$  (1.499 m)   Wt 59.9 kg   LMP 08/10/2021   SpO2 99%   BMI 26.66 kg/m?  ?Physical Exam ?Vitals and nursing note reviewed.  ?Constitutional:   ?   General: She is not in acute distress. ?   Appearance: She is not ill-appearing.  ?HENT:  ?   Head: Atraumatic.  ?   Mouth/Throat:  ? ?Eyes:  ?   Conjunctiva/sclera: Conjunctivae normal.  ?Cardiovascular:  ?   Rate and Rhythm: Normal rate and regular rhythm.  ?   Pulses: Normal pulses.  ?   Heart sounds: No murmur heard. ?Pulmonary:  ?   Effort: Pulmonary effort is normal. No respiratory distress.  ?   Breath sounds: Normal breath sounds.  ?Abdominal:  ?   General: Abdomen is flat. There is no distension.  ?   Palpations: Abdomen is  soft.  ?   Tenderness: There is no abdominal tenderness.  ?Musculoskeletal:     ?   General: Normal range of motion.  ?   Cervical back: Normal range of motion.  ?Skin: ?   General: Skin is warm and dry.  ?   Capillary Refill: Capillary refill takes less than 2 seconds.  ?Neurological:  ?   General: No focal deficit present.  ?   Mental Status: She is alert.  ?Psychiatric:     ?   Mood and Affect: Mood normal.  ? ? ?ED Results / Procedures / Treatments   ?Labs ?(all labs ordered are listed, but only abnormal results are displayed) ?Labs Reviewed - No data to display ? ?EKG ?None ? ?Radiology ?No results found. ? ?Procedures ?Procedures  ? ?Medications Ordered in ED ?Medications  ?oxyCODONE-acetaminophen (PERCOCET/ROXICET) 5-325 MG per tablet 1 tablet (1  tablet Oral Given 09/03/21 2133)  ?ketorolac (TORADOL) injection 30 mg (30 mg Intramuscular Given 09/03/21 2135)  ?amoxicillin-clavulanate (AUGMENTIN) 875-125 MG per tablet 1 tablet (1 tablet Oral Given 09/03/21 2202)  ? ? ?ED Course/ Medical Decision Making/ A&P ?  ?                        ?Medical Decision Making ?Risk ?Prescription drug management. ? ? ?History:  ?Per HPI ?Social determinants of health: none ? ?Initial impression: ? ?This patient presents to the ED for concern of dental pain, this involves an extensive number of treatment options, and is a complaint that carries with it a high risk of complications and morbidity.    ? ? ?Medicines ordered and prescription drug management: ? ?I ordered medication including: ?Toradol 30 mg IM ?Augmentin ?Percocet ?Reevaluation of the patient after these medicines showed that the patient improved ?I have reviewed the patients home medicines and have made adjustments as needed ? ?ED Course: ?36 year old female in no acute distress, nontoxic-appearing who presents for evaluation of a right mandibular molar fracture.  On exam, tooth 81 has longstanding caries with fracture extending into the dentin.  Pain was managed here in the ED with Toradol, Percocet.  Given the dentin exposure, I am sending patient home with a course of Augmentin to prevent development of abscess or odontogenic infection.  She does not have access to a dentist, so resource guide was provided.  Pain medication was also indicated as patient is unlikely to get an appointment until the weekend is over.  Return precautions were discussed. ? ?Disposition: ? ?After consideration of the diagnostic results, physical exam, history and the patients response to treatment feel that the patent would benefit from discharge.   ?Open fracture of tooth: Pain management prescribed.  Antibiotics prescribed.  Return precautions were discussed.  Plan and management as described above.  Patient discharged home in stable  condition. ? ? ? ?Final Clinical Impression(s) / ED Diagnoses ?Final diagnoses:  ?Open fracture of tooth, initial encounter  ? ? ?Rx / DC Orders ?ED Discharge Orders   ? ?      Ordered  ?  oxyCODONE-acetaminophen (PERCOCET/ROXICET) 5-325 MG tablet  Every 6 hours PRN,   Status:  Discontinued       ? 09/03/21 2132  ?  amoxicillin-clavulanate (AUGMENTIN) 875-125 MG tablet  Every 12 hours,   Status:  Discontinued       ? 09/03/21 2132  ?  amoxicillin-clavulanate (AUGMENTIN) 875-125 MG tablet  Every 12 hours       ? 09/03/21 2157  ?  oxyCODONE-acetaminophen (PERCOCET/ROXICET) 5-325 MG tablet  Every 6 hours PRN       ? 09/03/21 2157  ? ?  ?  ? ?  ? ? ?  ?Tonye Pearson, [place] ?09/03/21 2257 ? ?  ?Lorelle Gibbs, DO ?09/03/21 2315 ? ?

## 2021-09-03 NOTE — ED Triage Notes (Signed)
Patient having bottom right tooth pain. She said she was eating a hard peanut and thinks she could have cracked it. Does not see a dentist.  ?

## 2021-09-03 NOTE — Discharge Instructions (Addendum)
It looks as if you have fractured one of your back teeth when you are eating a peanut today, which is causing your pain.  We got your pain under control while you are here in the emergency department, and I have sent you home with some pain medications that you can use that will hopefully get you through the weekend.  I recommend alternating this with Motrin (up to 800 mg) in order to keep some pain medication on board.  I have also given you an antibiotic to prevent infection until you can be seen by a dentist.  I have attached a guide here for dental resources that you can call and attempt to set up an appointment.  I recommend just going down the list until you find one that can see you within a few days.  ? ? ?

## 2021-09-06 ENCOUNTER — Institutional Professional Consult (permissible substitution): Payer: Medicaid Other | Admitting: Obstetrics and Gynecology

## 2021-09-23 ENCOUNTER — Ambulatory Visit (INDEPENDENT_AMBULATORY_CARE_PROVIDER_SITE_OTHER): Payer: Self-pay | Admitting: Obstetrics and Gynecology

## 2021-09-23 ENCOUNTER — Encounter: Payer: Self-pay | Admitting: Obstetrics and Gynecology

## 2021-09-23 ENCOUNTER — Other Ambulatory Visit (HOSPITAL_COMMUNITY)
Admission: RE | Admit: 2021-09-23 | Discharge: 2021-09-23 | Disposition: A | Discharge status: Home / Self Care | Payer: Medicaid Other | Source: Ambulatory Visit | Attending: Obstetrics and Gynecology | Admitting: Obstetrics and Gynecology

## 2021-09-23 DIAGNOSIS — N920 Excessive and frequent menstruation with regular cycle: Secondary | ICD-10-CM | POA: Diagnosis present

## 2021-09-23 DIAGNOSIS — D219 Benign neoplasm of connective and other soft tissue, unspecified: Secondary | ICD-10-CM | POA: Insufficient documentation

## 2021-09-23 DIAGNOSIS — N946 Dysmenorrhea, unspecified: Secondary | ICD-10-CM | POA: Insufficient documentation

## 2021-09-23 MED ORDER — NORETHINDRONE ACETATE 5 MG PO TABS: 5.0000 mg | ORAL_TABLET | Freq: Every day | ORAL | 2 refills | Status: DC

## 2021-09-23 MED ORDER — TRAMADOL HCL 50 MG PO TABS: 50.0000 mg | ORAL_TABLET | Freq: Four times a day (QID) | ORAL | 0 refills | Status: DC | PRN

## 2021-09-23 NOTE — Patient Instructions (Signed)
Vaginal Hysterectomy, Care After ?The following information offers guidance on how to care for yourself after your procedure. Your health care provider may also give you more specific instructions. If you have problems or questions, contact your health care provider. ?What can I expect after the procedure? ?After the procedure, it is common to have: ?Pain in the lower abdomen and vagina. ?Vaginal bleeding and discharge for up to 1 week. You will need to use a sanitary pad after this procedure. ?Difficulty having a bowel movement (constipation). ?Temporary problems emptying the bladder. ?Tiredness (fatigue). ?Poor appetite. ?Less interest in sex. ?Feelings of sadness or other emotions. ?If your ovaries were also removed, it is also common to have symptoms of menopause, such as hot flashes, night sweats, and lack of sleep (insomnia). ?Follow these instructions at home: ?Medicines ? ?Take over-the-counter and prescription medicines only as told by your health care provider. ?Do not take aspirin or NSAIDs, such as ibuprofen. These medicines can cause bleeding. ?Ask your health care provider if the medicine prescribed to you: ?Requires you to avoid driving or using machinery. ?Can cause constipation. You may need to take these actions to prevent or treat constipation: ?Drink enough fluid to keep your urine pale yellow. ?Take over-the-counter or prescription medicines. ?Eat foods that are high in fiber, such as beans, whole grains, and fresh fruits and vegetables. ?Limit foods that are high in fat and processed sugars, such as fried or sweet foods. ?Activity ? ?Rest as told by your health care provider. ?Return to your normal activities as told by your health care provider. Ask your health care provider what activities are safe for you ?Avoid sitting for a long time without moving. Get up to take short walks every 1-2 hours. This is important to improve blood flow and breathing. Ask for help if you feel weak or  unsteady. ?Try to have someone home with you for 1-2 weeks to help you with everyday chores. ?Do not lift anything that is heavier than 10 lb (4.5 kg), or the limit that you are told, until your health care provider says that it is safe. ?If you were given a sedative during the procedure, it can affect you for several hours. Do not drive or operate machinery until your health care provider says that it is safe. ?Lifestyle ?Do not use any products that contain nicotine or tobacco. These products include cigarettes, chewing tobacco, and vaping devices, such as e-cigarettes. These can delay healing after surgery. If you need help quitting, ask your health care provider. ?Do not drink alcohol until your health care provider approves. ?General instructions ?Do not douche, use tampons, or have sex for at least 6 weeks, or as told by your health care provider. ?If you struggle with physical or emotional changes after your procedure, speak with your health care provider or a therapist. ?The stitches inside your vagina will dissolve over time and do not need to be taken out. ?Do not take baths, swim, or use a hot tub until your health care provider approves. You may only be allowed to take showers for 2-3 weeks ?Wear compression stockings as told by your health care provider. These stockings help to prevent blood clots and reduce swelling in your legs. ?Keep all follow-up visits. This is important. ?Contact a health care provider if: ?Your pain medicine is not helping. ?You have a fever. ?You have nausea or vomiting that does not go away. ?You feel dizzy. ?You have blood, pus, or a bad-smelling discharge from your vagina  more than 1 week after the procedure. ?You continue to have trouble urinating 3-5 days after the procedure. ?Get help right away if: ?You have severe pain in your abdomen or back. ?You faint. ?You have heavy vaginal bleeding and blood clots, soaking through a sanitary pad in less than 1 hour. ?You have chest  pain or shortness of breath. ?You have pain, swelling, or redness in your leg. ?These symptoms may represent a serious problem that is an emergency. Do not wait to see if the symptoms will go away. Get medical help right away. Call your local emergency services (911 in the [place] Do not drive yourself to the hospital. ?Summary ?After the procedure, it is common to have pain, vaginal bleeding, constipation, temporary problems emptying your bladder, and feelings of sadness or other emotions. ?Take over-the-counter and prescription medicines only as told by your health care provider. ?Rest as told by your health care provider. Return to your normal activities as told by your health care provider. ?Contact a health care provider if your pain medicine is not helping, or you have a fever, dizziness, or trouble urinating several days after the procedure. ?Get help right away if you have severe pain in your abdomen or back, or if you faint, have heavy bleeding, or have chest pain or shortness of breath. ?This information is not intended to replace advice given to you by your health care provider. Make sure you discuss any questions you have with your health care provider. ?Document Revised: 01/24/2020 Document Reviewed: 01/24/2020 ?Elsevier Patient Education ? [place]. ?Vaginal Hysterectomy ? ?A vaginal hysterectomy is a procedure to remove all or part of the uterus through a small incision in the vagina. In this procedure, your health care provider may remove your entire uterus, including the cervix. The cervix is the opening and bottom part of the uterus and is located between the vagina and the uterus. Sometimes, the ovaries and fallopian tubes are also removed. ?This surgery may be done to treat problems such as: ?Noncancerous growths in the uterus (uterine fibroids) that cause symptoms. ?A condition that causes the lining of the uterus to grow in other areas (endometriosis). ?Problems with pelvic  support. ?Cancer of the cervix, ovaries, uterus, or tissue that lines the uterus (endometrium). ?Excessive bleeding in the uterus. ?When removing your uterus, your health care provider may also remove the ovaries and the fallopian tubes. After this procedure, you will no longer be able to have a baby, and you will no longer have a menstrual period. ?Tell a health care provider about: ?Any allergies you have. ?All medicines you are taking, including vitamins, herbs, eye drops, creams, and over-the-counter medicines. ?Any problems you or family members have had with anesthetic medicines. ?Any blood disorders you have. ?Any surgeries you have had. ?Any medical conditions you have. ?Whether you are pregnant or may be pregnant. ?What are the risks? ?Generally, this is a safe procedure. However, problems may occur, including: ?Bleeding. ?Infection. ?Blood clots in the legs or lungs. ?Damage to nearby structures or organs. ?Pain during sex. ?Allergic reactions to medicines. ?What happens before the procedure? ?Staying hydrated ?Follow instructions from your health care provider about hydration, which may include: ?Up to 2 hours before the procedure - you may continue to drink clear liquids, such as water, clear fruit juice, black coffee, and plain tea. ? ?Eating and drinking restrictions ?Follow instructions from your health care provider about eating and drinking, which may include: ?8 hours before the procedure - stop eating  heavy meals or foods, such as meat, fried foods, or fatty foods. ?6 hours before the procedure - stop eating light meals or foods, such as toast or cereal. ?6 hours before the procedure - stop drinking milk or drinks that contain milk. ?2 hours before the procedure - stop drinking clear liquids. ?Medicines ?Ask your health care provider about: ?Changing or stopping your regular medicines. This is especially important if you are taking diabetes medicines or blood thinners. ?Taking medicines such as  aspirin and ibuprofen. These medicines can thin your blood. Do not take these medicines unless your health care provider tells you to take them. ?Taking over-the-counter medicines, vitamins, herbs, and supplements. ?You may

## 2021-09-23 NOTE — Progress Notes (Signed)
Natalie Thornton presents in referral form Dr. Johnney Ou for eval of [facility] and uterine fibroids. ?Pt reports monthly cycles, heavy with cramps, clots, last 5-7 days ?GYN U/S uterine fibroid 4 cm x 3 cm, vol 83 gm ?Has tried numerous hormonal manipulation options in the past without relief. ? ?Nulligravid ?Same sex relationship ?Pap smear due ? ?Denies any bowel or bladder dysfunction. ? ?PE AF VSS ?Chaperone present during exam ? ?Lungs clear Heart RRR ?Abd soft + BS ?GU Nl EGBUS, cervix no lesions, pap smear collected, uterus small, mobile, no masses or tenderness ? ?A/P [facility] ?       Uterine fibroids ? ?Tx options reviewed with pt. Pt desires definite therapy. She does not desire children. She does not want to try any other medical options including IUD. Will start Aygestin and Tramadol for now. TVH with possible salpingectomy reviewed with pt. R/B/Post op care reviewed. Inability to conceive and bear children discussed. Pt is considering egg donor with her partner.  Information provided. Hysterectomy papers signed. Financial aid package provided to pt. Pt instructed to call office once approved and surgery will be scheduled.  ?

## 2021-09-23 NOTE — Progress Notes (Signed)
Pt was seen by Dr Jodi Mourning a couple weeks ago, here today for surgical consult.  ?Pt states that Naproxen she was given is too expensive, ?new Rx. ? ?

## 2021-09-30 LAB — CYTOLOGY - PAP
Comment: NEGATIVE
Diagnosis: NEGATIVE
High risk HPV: NEGATIVE

## 2021-10-20 ENCOUNTER — Ambulatory Visit: Payer: Medicaid Other | Admitting: Nurse Practitioner

## 2021-10-20 NOTE — Progress Notes (Deleted)
Assessment   Patient Profile:  Natalie Thornton is a 36 y.o. female known to Dr. Henrene Pastor with a past medical history of anxiety, chronic back pain, arthritis, H. pylori infection, chronic functional abdominal pain, liver hemangiomas. Additional medical history as listed in [place] .   Natalie Thornton has a history of chronic abdominal pain.  She has undergone extensive previous work-up (colonoscopy, upper endoscopies , RUQ [place] x2 CT scan, MRI, gastric emptying scan, blood work) without organic cause ascertained.  She was previously treated for Helicobacter pylori.  Successful eradication was proven with endoscopic biopsy.    Patient was last seen in the office April 2022 with nausea and upper abdominal pain.  We recommended dicyclomine, famotidine, abdominal ultrasound.  Labs were obtained. [place] again showed hepatic hemangioma.   Interval history:  Since last being seen in the office in April 2022 patient has been to [facility] / ED and had an additional labs,  2 CT scans and another RUQ ultrasound.   CBC repeatedly normal except for mild leukopenia in January.  Liver chemistries repeatedly normal.  Lipase repeatedly normal.   RUQ ultrasound on 03/03/2021 was normal.    CT scan with contrast on 06/07/2021 showed a fibroid in the uterus, multiple hypodense liver cysts that were unchanged.  Exam otherwise .  Another CT scan with contrast on 06/21/2021 showed moderate fecal retention.  Again the liver hemangiomas were seen and a uterine fibroid.  No other findings.      Plan      HPI   Chief Complaint :     Previous GI Evaluation   Endoscopies:  Colonoscopy - March 201 for abdominal pain  The examined portion of the ileum was normal. - The entire examined colon is normal on direct and retroflexion views. - No specimens collected.  EGD Oct 2018 - Mild Schatzki ring. - Normal esophagus. - Normal stomach. Biopsied. - Normal examined duodenum.  EGD November 2019  The esophagus was normal. -  The stomach was normal vital hernia. Biopsies were taken with a cold forceps for Helicobacter pylori testing using CLOtest. - The examined duodenum was normal. - The cardia and gastric fundus were normal on retroflexion.  EGD Feb 2021 - The esophagus was normal. - The stomach was normal. - The examined duodenum was normal. - The cardia and gastric fundus were normal on retroflexion - Normal exam   Imaging  Multiple CT scans, RUQ [place]  Labs:     Latest Ref Rng & Units 06/21/2021   12:08 PM 06/07/2021    1:52 PM 03/03/2021   10:35 PM  CBC  WBC 4.0 - 10.5 K/uL 3.0   4.5   6.3    Hemoglobin 12.0 - 15.0 g/dL 13.4   13.8   12.3    Hematocrit 36.0 - 46.0 % 40.7   42.0   36.5    Platelets 150 - 400 K/uL 298   237   243         Latest Ref Rng & Units 06/21/2021   12:08 PM 06/07/2021    1:52 PM 03/03/2021   10:35 PM  Hepatic Function  Total Protein 6.5 - 8.1 g/dL 7.1   6.6   6.7    Albumin 3.5 - 5.0 g/dL 4.3   3.8   3.8    AST 15 - 41 U/L _0 ALT 0 - 44 U/L _1 Alk Phosphatase  38 - 126 U/L 30   33   35    Total Bilirubin 0.3 - 1.2 mg/dL 0.5   0.6   0.5       Past Medical History:  Diagnosis Date   Anxiety    Arthritis    Chronic back pain    DDD (degenerative disc disease)    Degeneration of intervertebral disc of lumbar region 07/30/2019   Degenerative disc disease, cervical    Disc disease, degenerative, cervical    H pylori ulcer    H/O degenerative disc disease 2013   History of hiatal hernia    Low back pain    Numbness    Whole back    Pilonidal cyst     Past Surgical History:  Procedure Laterality Date   COLONOSCOPY     LIPOMA EXCISION Right 08/10/2018   Procedure: EXCISION LIPOMA, RIGHT;  Surgeon: Fredirick Maudlin, MD;  Location: [facility] ORS;  Service: General;  Laterality: Right;    Current Medications, Allergies, Family History and Social History were reviewed in [place] record.     Current Outpatient  Medications  Medication Sig Dispense Refill   amoxicillin-clavulanate (AUGMENTIN) 875-125 MG tablet Take 1 tablet by mouth every 12 (twelve) hours. 14 tablet 0   dicyclomine (BENTYL) 10 MG capsule Take 1 capsule (10 mg total) by mouth 3 (three) times daily. 60 capsule 0   famotidine (PEPCID) 20 MG tablet Take 1 tablet (20 mg total) by mouth daily. 30 tablet 2   hydrocortisone cream 1 % Apply to affected area 2 times daily 15 g 0   naproxen (EC-NAPROSYN) 500 MG EC tablet Take 1 tablet (500 mg total) by mouth 2 (two) times daily with a meal. 30 tablet 5   naproxen sodium (ALEVE) 220 MG tablet Take 220 mg by mouth.     norethindrone (AYGESTIN) 5 MG tablet Take 1 tablet (5 mg total) by mouth daily. 30 tablet 2   oxyCODONE-acetaminophen (PERCOCET/ROXICET) 5-325 MG tablet Take 1 tablet by mouth every 6 (six) hours as needed for severe pain. 15 tablet 0   traMADol (ULTRAM) 50 MG tablet Take 1 tablet (50 mg total) by mouth every 6 (six) hours as needed. 60 tablet 0   No current facility-administered medications for this visit.    Review of Systems: No chest pain. No shortness of breath. No urinary complaints.    Physical Exam  Wt Readings from Last 3 Encounters:  09/23/21 129 lb (58.5 kg)  09/03/21 132 lb (59.9 kg)  08/11/21 132 lb (59.9 kg)    There were no vitals taken for this visit. Constitutional:  Generally well appearing ***female in no acute distress. Psychiatric: Pleasant. Normal mood and affect. Behavior is normal. EENT: Pupils normal.  Conjunctivae are normal. No scleral icterus. Neck supple.  Cardiovascular: Normal rate, regular rhythm. No edema Pulmonary/chest: Effort normal and breath sounds normal. No wheezing, rales or rhonchi. Abdominal: Soft, nondistended, nontender. Bowel sounds active throughout. There are no masses palpable. No hepatomegaly. Neurological: Alert and oriented to person place and time. Skin: Skin is warm and dry. No rashes noted.  Tye Savoy, NP   10/20/2021, 8:46 AM  Cc:  No ref. provider found

## 2021-11-16 ENCOUNTER — Encounter: Payer: Self-pay | Admitting: Obstetrics and Gynecology

## 2021-11-18 ENCOUNTER — Telehealth: Payer: Self-pay

## 2021-12-01 ENCOUNTER — Emergency Department (HOSPITAL_COMMUNITY)
Admission: EM | Admit: 2021-12-01 | Discharge: 2021-12-02 | Disposition: A | Discharge status: Home / Self Care | Payer: Commercial Managed Care - HMO | Attending: Emergency Medicine | Admitting: Emergency Medicine

## 2021-12-01 ENCOUNTER — Emergency Department (HOSPITAL_COMMUNITY): Payer: Commercial Managed Care - HMO

## 2021-12-01 ENCOUNTER — Telehealth: Payer: Commercial Managed Care - HMO | Admitting: Physician Assistant

## 2021-12-01 ENCOUNTER — Other Ambulatory Visit: Payer: Self-pay

## 2021-12-01 DIAGNOSIS — R110 Nausea: Secondary | ICD-10-CM | POA: Diagnosis not present

## 2021-12-01 DIAGNOSIS — R1084 Generalized abdominal pain: Secondary | ICD-10-CM | POA: Insufficient documentation

## 2021-12-01 DIAGNOSIS — N9489 Other specified conditions associated with female genital organs and menstrual cycle: Secondary | ICD-10-CM | POA: Insufficient documentation

## 2021-12-01 DIAGNOSIS — R1031 Right lower quadrant pain: Secondary | ICD-10-CM

## 2021-12-01 LAB — CBC WITH DIFFERENTIAL/PLATELET
Abs Immature Granulocytes: 0.03 10*3/uL (ref 0.00–0.07)
Basophils Absolute: 0 10*3/uL (ref 0.0–0.1)
Basophils Relative: 0 %
Eosinophils Absolute: 0 10*3/uL (ref 0.0–0.5)
Eosinophils Relative: 1 %
HCT: 41.8 % (ref 36.0–46.0)
Hemoglobin: 13.5 g/dL (ref 12.0–15.0)
Immature Granulocytes: 0 %
Lymphocytes Relative: 25 %
Lymphs Abs: 1.8 10*3/uL (ref 0.7–4.0)
MCH: 30.1 pg (ref 26.0–34.0)
MCHC: 32.3 g/dL (ref 30.0–36.0)
MCV: 93.3 fL (ref 80.0–100.0)
Monocytes Absolute: 0.5 10*3/uL (ref 0.1–1.0)
Monocytes Relative: 7 %
Neutro Abs: 5 10*3/uL (ref 1.7–7.7)
Neutrophils Relative %: 67 %
Platelets: 316 10*3/uL (ref 150–400)
RBC: 4.48 MIL/uL (ref 3.87–5.11)
RDW: 12.6 % (ref 11.5–15.5)
WBC: 7.4 10*3/uL (ref 4.0–10.5)
nRBC: 0 % (ref 0.0–0.2)

## 2021-12-01 LAB — URINALYSIS, ROUTINE W REFLEX MICROSCOPIC
Bacteria, UA: NONE SEEN
Bilirubin Urine: NEGATIVE
Glucose, UA: NEGATIVE mg/dL
Hgb urine dipstick: NEGATIVE
Ketones, ur: 5 mg/dL — AB
Nitrite: NEGATIVE
Protein, ur: 30 mg/dL — AB
Specific Gravity, Urine: 1.028 (ref 1.005–1.030)
pH: 5 (ref 5.0–8.0)

## 2021-12-01 LAB — COMPREHENSIVE METABOLIC PANEL
ALT: 14 U/L (ref 0–44)
AST: 17 U/L (ref 15–41)
Albumin: 4.1 g/dL (ref 3.5–5.0)
Alkaline Phosphatase: 39 U/L (ref 38–126)
Anion gap: 6 (ref 5–15)
BUN: 8 mg/dL (ref 6–20)
CO2: 29 mmol/L (ref 22–32)
Calcium: 9.7 mg/dL (ref 8.9–10.3)
Chloride: 103 mmol/L (ref 98–111)
Creatinine, Ser: 0.92 mg/dL (ref 0.44–1.00)
GFR, Estimated: >60 mL/min (ref >60)
Glucose, Bld: 109 mg/dL — ABNORMAL HIGH (ref 70–99)
Potassium: 4 mmol/L (ref 3.5–5.1)
Sodium: 138 mmol/L (ref 135–145)
Total Bilirubin: 0.6 mg/dL (ref 0.3–1.2)
Total Protein: 7.2 g/dL (ref 6.5–8.1)

## 2021-12-01 LAB — LIPASE, BLOOD: Lipase: 32 U/L (ref 11–51)

## 2021-12-01 LAB — I-STAT BETA HCG BLOOD, ED (MC, WL, AP ONLY): I-stat hCG, quantitative: <5 m[IU]/mL (ref <5)

## 2021-12-01 MED ORDER — IOHEXOL 300 MG/ML  SOLN
80.0000 mL | Freq: Once | INTRAMUSCULAR | Status: AC | PRN
  Administered 2021-12-01: 80 mL via INTRAVENOUS

## 2021-12-01 MED ORDER — KETOROLAC TROMETHAMINE 15 MG/ML IJ SOLN
15.0000 mg | Freq: Once | INTRAMUSCULAR | Status: AC
  Administered 2021-12-01: 15 mg via INTRAVENOUS
  Filled 2021-12-01: qty 1

## 2021-12-01 NOTE — Patient Instructions (Signed)
Natalie Thornton, thank you for joining Mar Daring, PA-C for today's virtual visit.  While this provider is not your primary care provider (PCP), if your PCP is located in our provider database this encounter information will be shared with them immediately following your visit.  Consent: (Patient) Natalie Thornton provided verbal consent for this virtual visit at the beginning of the encounter.  Current Medications:  Current Outpatient Medications:    amoxicillin-clavulanate (AUGMENTIN) 875-125 MG tablet, Take 1 tablet by mouth every 12 (twelve) hours., Disp: 14 tablet, Rfl: 0   dicyclomine (BENTYL) 10 MG capsule, Take 1 capsule (10 mg total) by mouth 3 (three) times daily., Disp: 60 capsule, Rfl: 0   famotidine (PEPCID) 20 MG tablet, Take 1 tablet (20 mg total) by mouth daily., Disp: 30 tablet, Rfl: 2   hydrocortisone cream 1 %, Apply to affected area 2 times daily, Disp: 15 g, Rfl: 0   naproxen (EC-NAPROSYN) 500 MG EC tablet, Take 1 tablet (500 mg total) by mouth 2 (two) times daily with a meal., Disp: 30 tablet, Rfl: 5   naproxen sodium (ALEVE) 220 MG tablet, Take 220 mg by mouth., Disp: , Rfl:    norethindrone (AYGESTIN) 5 MG tablet, Take 1 tablet (5 mg total) by mouth daily., Disp: 30 tablet, Rfl: 2   oxyCODONE-acetaminophen (PERCOCET/ROXICET) 5-325 MG tablet, Take 1 tablet by mouth every 6 (six) hours as needed for severe pain., Disp: 15 tablet, Rfl: 0   traMADol (ULTRAM) 50 MG tablet, Take 1 tablet (50 mg total) by mouth every 6 (six) hours as needed., Disp: 60 tablet, Rfl: 0   Medications ordered in this encounter:  No orders of the defined types were placed in this encounter.    *If you need refills on other medications prior to your next appointment, please contact your pharmacy*  Follow-Up: Call back or seek an in-person evaluation if the symptoms worsen or if the condition fails to improve as anticipated.  Other Instructions Based on what you shared with me, I feel  your condition warrants further evaluation as soon as possible at an Emergency department.   If you are having a true medical emergency please call 911.      Emergency [facility]  Get Driving Directions  [phone]  [address]  [place], [place] [address]  Open 24/7/365      [facility] Emergency Department at [place]  [address]  [place], [place] [address]  Open 24/7/365    Emergency [facility]  Get Driving Directions  [phone]  [address], [place] [address]  Open 24/7/365      [facility]  Get Driving Directions  [phone]  [address]  [place], [place] [address]  Open 24/7/365    [facility]  Emergency [place]  Get Driving Directions  [phone]  [place], [place] [address]  Open 24/7/365    [place]  Emergency Department- [place]  Get Driving Directions  [address]  [place], [place] [address]  Open 24/7/365    [facility]  Emergency [facility]  Get Driving Directions  [phone]  [address]  [place], [place] [address]  Open 24/7/365     If you have been instructed to have an in-person evaluation today at a local Urgent Care facility, please use the  link below. It will take you to a list of all of our available [facility], including address, phone number and hours of operation. Please do not delay care.  [facility]  If you or a family member do not have a primary care provider, use the link below to schedule a visit and establish care. When you choose a [facility] primary care physician or advanced practice provider, you gain a long-term partner in health. Find a Primary Care Provider  Learn more about [facility]'s in-office and virtual care options: [facility]

## 2021-12-01 NOTE — ED Provider Triage Note (Signed)
Emergency Medicine Provider Triage Evaluation Note  Natalie Thornton , a 36 y.o. female  was evaluated in triage.  Pt complains of right lower quadrant abdominal pain.  Has history of fibroids, and cyst however states this episode is different.  Due to severity and associated nausea, vomiting.  Also endorses lack of appetite.  Has hysterectomy scheduled for next month.  Denies history of abdominal surgeries.  Review of Systems  Positive: As above Negative: As above   Physical Exam  BP (!) 142/84   Pulse 75   Temp 98.4 F (36.9 C) (Oral)   Resp 17   Ht '4\' 11"'$  (1.499 m)   Wt 58.1 kg   SpO2 100%   BMI 25.85 kg/m  Gen:   Awake, no distress   Resp:  Normal effort  MSK:   Moves extremities without difficulty Other:  Generalized abdominal tenderness present.  Medical Decision Making  Medically screening exam initiated at 7:59 PM.  Appropriate orders placed.  Natalie Thornton was informed that the remainder of the evaluation will be completed by another provider, this initial triage assessment does not replace that evaluation, and the importance of remaining in the ED until their evaluation is complete.     Evlyn Courier, PA-C 12/01/21 2000

## 2021-12-01 NOTE — Progress Notes (Signed)
Virtual Visit Consent   [name], you are scheduled for a virtual visit with a [facility] provider today. Just as with appointments in the office, your consent must be obtained to participate. Your consent will be active for this visit and any virtual visit you may have with one of our providers in the next 365 days. If you have a MyChart account, a copy of this consent can be sent to you electronically.  As this is a virtual visit, video technology does not allow for your provider to perform a traditional examination. This may limit your provider's ability to fully assess your condition. If your provider identifies any concerns that need to be evaluated in person or the need to arrange testing (such as labs, EKG, etc.), we will make arrangements to do so. Although advances in technology are sophisticated, we cannot ensure that it will always work on either your end or our end. If the connection with a video visit is poor, the visit may have to be switched to a telephone visit. With either a video or telephone visit, we are not always able to ensure that we have a secure connection.  By engaging in this virtual visit, you consent to the provision of healthcare and authorize for your insurance to be billed (if applicable) for the services provided during this visit. Depending on your insurance coverage, you may receive a charge related to this service.  I need to obtain your verbal consent now. Are you willing to proceed with your visit today? [name] has provided verbal consent on 12/01/2021 for a virtual visit (video or telephone). Natalie Daring, PA-C  Date: 12/01/2021 6:29 PM  Virtual Visit via Video Note   I, Natalie Thornton, connected with  [name]  ([id number], 07-Apr-1986) on 12/01/21 at  6:15 PM EDT by a video-enabled telemedicine application and verified that I am speaking with the correct person using two identifiers.  Location: Patient: Virtual Visit  Location Patient: Home Provider: Virtual Visit Location Provider: Home Office   I discussed the limitations of evaluation and management by telemedicine and the availability of in person appointments. The patient expressed understanding and agreed to proceed.    History of Present Illness: Natalie Thornton is a 36 y.o. who identifies as a female who was assigned female at birth, and is being seen today for abdominal pain.  HPI: Abdominal Pain This is a new problem. The current episode started in the past 7 days (over last 4 days but worsened acutely last night). The onset quality is sudden. The problem occurs constantly. The problem has been waxing and waning. The pain is located in the RLQ. The pain is severe. The quality of the pain is aching, cramping, a sensation of fullness and sharp. Pain radiation: generalized stomach pain and tenderness to touch. Associated symptoms include anorexia (decreased appetite over last 4 days significant enough for weight loss), constipation (has chronically), myalgias, nausea and weight loss. Pertinent negatives include no arthralgias, belching, diarrhea, dysuria, fever, flatus, frequency, headaches, hematochezia, hematuria, melena or vomiting. Associated symptoms comments: General malaise and weakness. The pain is aggravated by certain positions, movement, palpation and eating. The pain is relieved by Being still. She has tried acetaminophen for the symptoms. The treatment provided no relief. Prior diagnostic workup includes CT scan (from Jan 2023).      Problems:  Patient Active Problem List   Diagnosis Date Noted   Fibroids 09/23/2021   Dysmenorrhea 09/23/2021   Menorrhagia  09/23/2021   Epigastric pain 09/10/2020   Lipoma of back 06/05/2019   Lumbar foraminal stenosis 03/13/2019   Neck pain 01/18/2019   S/P excision of lipoma 09/12/2018   Chronic bilateral thoracic back pain 07/17/2018   DDD (degenerative disc disease), lumbar 06/14/2018   Anxiety and  depression 01/06/2017   Cigarette smoker 10/20/2016   Solitary pulmonary nodule 10/20/2016    Allergies: No Known Allergies Medications:  Current Outpatient Medications:    amoxicillin-clavulanate (AUGMENTIN) 875-125 MG tablet, Take 1 tablet by mouth every 12 (twelve) hours., Disp: 14 tablet, Rfl: 0   dicyclomine (BENTYL) 10 MG capsule, Take 1 capsule (10 mg total) by mouth 3 (three) times daily., Disp: 60 capsule, Rfl: 0   famotidine (PEPCID) 20 MG tablet, Take 1 tablet (20 mg total) by mouth daily., Disp: 30 tablet, Rfl: 2   hydrocortisone cream 1 %, Apply to affected area 2 times daily, Disp: 15 g, Rfl: 0   naproxen (EC-NAPROSYN) 500 MG EC tablet, Take 1 tablet (500 mg total) by mouth 2 (two) times daily with a meal., Disp: 30 tablet, Rfl: 5   naproxen sodium (ALEVE) 220 MG tablet, Take 220 mg by mouth., Disp: , Rfl:    norethindrone (AYGESTIN) 5 MG tablet, Take 1 tablet (5 mg total) by mouth daily., Disp: 30 tablet, Rfl: 2   oxyCODONE-acetaminophen (PERCOCET/ROXICET) 5-325 MG tablet, Take 1 tablet by mouth every 6 (six) hours as needed for severe pain., Disp: 15 tablet, Rfl: 0   traMADol (ULTRAM) 50 MG tablet, Take 1 tablet (50 mg total) by mouth every 6 (six) hours as needed., Disp: 60 tablet, Rfl: 0  Observations/Objective: Patient is well-developed, well-nourished in no acute distress.  Resting comfortably at home.  Head is normocephalic, atraumatic.  No labored breathing.  Speech is clear and coherent with logical content.  Patient is alert and oriented at baseline.  Patient appears uncomfortable  Assessment and Plan: 1. Pain, abdominal, RLQ  - Acute abd pain in RLQ, does have h/o ovarian cysts - DDx: appendicitis, ovarian cyst rupture, ovarian torsion, constipation - Advised to seek immediate care at a local ER for further evaluation  Follow Up Instructions: I discussed the assessment and treatment plan with the patient. The patient was provided an opportunity to ask  questions and all were answered. The patient agreed with the plan and demonstrated an understanding of the instructions.  A copy of instructions were sent to the patient via MyChart unless otherwise noted below.    The patient was advised to call back or seek an in-person evaluation if the symptoms worsen or if the condition fails to improve as anticipated.  Time:  I spent 12 minutes with the patient via telehealth technology discussing the above problems/concerns.    Natalie Daring, PA-C

## 2021-12-01 NOTE — ED Provider Notes (Signed)
[facility]-EMERGENCY DEPT Provider Note  CSN: [id number] Arrival date & time: 12/01/21 1927  Chief Complaint(s) Abdominal Pain  HPI Natalie Thornton is a 36 y.o. female     Abdominal Pain Pain location:  RLQ Pain quality: fullness   Pain severity:  Severe Onset quality:  Gradual Duration:  1 day Timing:  Constant Progression:  Unchanged Chronicity:  Chronic (worse today) Context comment:  Known fibroid Relieved by:  None tried Worsened by:  Palpation Ineffective treatments:  None tried Associated symptoms: nausea   Associated symptoms: no constipation, no diarrhea, no vaginal bleeding, no vaginal discharge and no vomiting     Past Medical History Past Medical History:  Diagnosis Date   Anxiety    Arthritis    Chronic back pain    DDD (degenerative disc disease)    Degeneration of intervertebral disc of lumbar region 07/30/2019   Degenerative disc disease, cervical    Disc disease, degenerative, cervical    H pylori ulcer    H/O degenerative disc disease 2013   History of hiatal hernia    Low back pain    Numbness    Whole back    Pilonidal cyst    Patient Active Problem List   Diagnosis Date Noted   Fibroids 09/23/2021   Dysmenorrhea 09/23/2021   Menorrhagia 09/23/2021   Epigastric pain 09/10/2020   Lipoma of back 06/05/2019   Lumbar foraminal stenosis 03/13/2019   Neck pain 01/18/2019   S/P excision of lipoma 09/12/2018   Chronic bilateral thoracic back pain 07/17/2018   DDD (degenerative disc disease), lumbar 06/14/2018   Anxiety and depression 01/06/2017   Cigarette smoker 10/20/2016   Solitary pulmonary nodule 10/20/2016   Home Medication(s) Prior to Admission medications   Medication Sig Start Date End Date Taking? Authorizing Provider  amoxicillin-clavulanate (AUGMENTIN) 875-125 MG tablet Take 1 tablet by mouth every 12 (twelve) hours. 09/03/21   Janell Quiet, PA-C  dicyclomine (BENTYL) 10 MG capsule Take 1 capsule (10  mg total) by mouth 3 (three) times daily. 10/01/20   Arnaldo Natal, NP  famotidine (PEPCID) 20 MG tablet Take 1 tablet (20 mg total) by mouth daily. 10/01/20   Arnaldo Natal, NP  hydrocortisone cream 1 % Apply to affected area 2 times daily 05/21/21   Gustavus Bryant, FNP  naproxen (EC-NAPROSYN) 500 MG EC tablet Take 1 tablet (500 mg total) by mouth 2 (two) times daily with a meal. 08/11/21   Brock Bad, MD  naproxen sodium (ALEVE) 220 MG tablet Take 220 mg by mouth.    [provider]  norethindrone (AYGESTIN) 5 MG tablet Take 1 tablet (5 mg total) by mouth daily. 09/23/21   Hermina Staggers, MD  oxyCODONE-acetaminophen (PERCOCET/ROXICET) 5-325 MG tablet Take 1 tablet by mouth every 6 (six) hours as needed for severe pain. 09/03/21   Janell Quiet, PA-C  traMADol (ULTRAM) 50 MG tablet Take 1 tablet (50 mg total) by mouth every 6 (six) hours as needed. 09/23/21   Hermina Staggers, MD  albuterol (VENTOLIN HFA) 108 (90 Base) MCG/ACT inhaler Inhale 2 puffs into the lungs every 2 (two) hours as needed for wheezing or shortness of breath (cough). Patient not taking: No sig reported 03/08/19 06/15/19  Muthersbaugh, Dahlia Client, PA-C  omeprazole (PRILOSEC) 20 MG capsule Take 1 capsule (20 mg total) by mouth daily. Patient not taking: No sig reported 10/30/19 12/03/19  Anders Simmonds, PA-C  sucralfate (CARAFATE) 1 GM/10ML suspension Take 10 mLs (1  g total) by mouth 4 (four) times daily -  with meals and at bedtime. Patient not taking: No sig reported 06/09/19 06/15/19  Wallis Bamberg, PA-C                                                                                                                                    Allergies Patient has no known allergies.  Review of Systems Review of Systems  Gastrointestinal:  Positive for abdominal pain and nausea. Negative for constipation, diarrhea and vomiting.  Genitourinary:  Negative for vaginal bleeding and vaginal discharge.   As noted  in HPI  Physical Exam Vital Signs  I have reviewed the triage vital signs BP (!) 120/50   Pulse 60   Temp 98.4 F (36.9 C) (Oral)   Resp 16   Ht 4\' 11"  (1.499 m)   Wt 58.1 kg   SpO2 100%   BMI 25.85 kg/m   Physical Exam Vitals reviewed.  Constitutional:      General: She is not in acute distress.    Appearance: She is well-developed. She is not diaphoretic.  HENT:     Head: Normocephalic and atraumatic.     Right Ear: External ear normal.     Left Ear: External ear normal.     Nose: Nose normal.  Eyes:     General: No scleral icterus.    Conjunctiva/sclera: Conjunctivae normal.  Neck:     Trachea: Phonation normal.  Cardiovascular:     Rate and Rhythm: Normal rate and regular rhythm.  Pulmonary:     Effort: Pulmonary effort is normal. No respiratory distress.     Breath sounds: No stridor.  Abdominal:     General: There is no distension.     Tenderness: There is generalized abdominal tenderness. There is no guarding or rebound.  Musculoskeletal:        General: Normal range of motion.     Cervical back: Normal range of motion.  Neurological:     Mental Status: She is alert and oriented to person, place, and time.  Psychiatric:        Behavior: Behavior normal.     ED Results and Treatments Labs (all labs ordered are listed, but only abnormal results are displayed) Labs Reviewed  COMPREHENSIVE METABOLIC PANEL - Abnormal; Notable for the following components:      Result Value   Glucose, Bld 109 (*)    All other components within normal limits  URINALYSIS, ROUTINE W REFLEX MICROSCOPIC - Abnormal; Notable for the following components:   Color, Urine AMBER (*)    APPearance CLOUDY (*)    Ketones, ur 5 (*)    Protein, ur 30 (*)    Leukocytes,Ua TRACE (*)    All other components within normal limits  CBC WITH DIFFERENTIAL/PLATELET  LIPASE, BLOOD  I-STAT BETA HCG BLOOD, ED ([facility], [facility], [facility] ONLY)  EKG  EKG Interpretation  Date/Time:    Ventricular Rate:    PR Interval:    QRS Duration:   QT Interval:    QTC Calculation:   R Axis:     Text Interpretation:         Radiology CT ABDOMEN PELVIS W CONTRAST  Result Date: 12/01/2021 CLINICAL DATA:  Right lower quadrant pain EXAM: CT ABDOMEN AND PELVIS WITH CONTRAST TECHNIQUE: Multidetector CT imaging of the abdomen and pelvis was performed using the standard protocol following bolus administration of intravenous contrast. RADIATION DOSE REDUCTION: This exam was performed according to the departmental dose-optimization program which includes automated exposure control, adjustment of the mA and/or kV according to patient size and/or use of iterative reconstruction technique. CONTRAST:  80mL OMNIPAQUE IOHEXOL 300 MG/ML  SOLN COMPARISON:  06/21/2021 FINDINGS: Lower chest: No acute abnormality Hepatobiliary: 9 mm hypodensity in the right hepatic lobe and 16 mm hypodensity in the left hepatic lobe, stable since prior study most compatible with hemangiomas. No suspicious focal hepatic abnormality. Gallbladder unremarkable. Pancreas: No focal abnormality or ductal dilatation. Spleen: No focal abnormality.  Normal size. Adrenals/Urinary Tract: No adrenal abnormality. No focal renal abnormality. No stones or hydronephrosis. Urinary bladder is unremarkable. Stomach/Bowel: Normal appendix. Stomach, large and small bowel grossly unremarkable. Vascular/Lymphatic: No evidence of aneurysm or adenopathy. Reproductive: Uterus and adnexa unremarkable.  No mass. Other: No free fluid or free air. Musculoskeletal: No acute bony abnormality. IMPRESSION: Normal appendix. 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 No acute findings. Electronically Signed   By: Charlett Nose M.D.   On: 12/01/2021 21:13    Pertinent labs & imaging results that were available during my care of the patient were reviewed by me and considered in my  medical decision making (see MDM for details).  Medications Ordered in ED Medications  alum & mag hydroxide-simeth (MAALOX/MYLANTA) 200-200-20 MG/5ML suspension 30 mL (has no administration in time range)  hyoscyamine (LEVSIN SL) SL tablet 0.25 mg (has no administration in time range)  iohexol (OMNIPAQUE) 300 MG/ML solution 80 mL (80 mLs Intravenous Contrast Given 12/01/21 2105)  ketorolac (TORADOL) 15 MG/ML injection 15 mg (15 mg Intravenous Given 12/01/21 2335)                                                                                                                                     Procedures Procedures  (including critical care time)  Medical Decision Making / ED Course    Complexity of Problem:   Patient's presenting problem/concern, DDX, and MDM listed below: Abd pain We will rule out pregnancy related process, assess for UTI/pyelonephritis, assess for intra-abdominal inflammatory/infectious process such as appendicitis. Pain may be related to chronic abdominal pain.  Less concerned for torsion  Hospitalization Considered:  Yes if found to have appendicitis or other surgical emergency     Complexity of Data:    Laboratory Tests ordered listed below with my independent interpretation: CBC without  leukocytosis or anemia Metabolic panel without significant electrolyte derangements or renal sufficiency.  No evidence of bili obstruction or pancreatitis. UA without evidence of infection hCG negative   Imaging Studies ordered listed below with my independent interpretation: CT of the abdomen without evidence of serious intra-abdominal inflammatory/infectious process or bowel obstruction.     ED Course:    Assessment, Add'l Intervention, and Reassessment: Abd pain Work up reassuring w/o serious etiology Treated with toradol and GI cocktail Patient has hysterectomy in July for fibroids    Final Clinical Impression(s) / ED Diagnoses Final diagnoses:   Generalized abdominal pain   The patient appears reasonably screened and/or stabilized for discharge and I doubt any other medical condition or other [facility] requiring further screening, evaluation, or treatment in the ED at this time prior to discharge. Safe for discharge with strict return precautions.  Disposition: Discharge  Condition: Good  I have discussed the results, Dx and Tx plan with the patient/family who expressed understanding and agree(s) with the plan. Discharge instructions discussed at length. The patient/family was given strict return precautions who verbalized understanding of the instructions. No further questions at time of discharge.    ED Discharge Orders     None         Follow Up: Primary care provider  Call  to schedule an appointment for close follow up           This chart was dictated using voice recognition software.  Despite best efforts to proofread,  errors can occur which can change the documentation meaning.    Nira Conn, MD 12/02/21 [phone]

## 2021-12-01 NOTE — ED Triage Notes (Signed)
Patient coming to ED for evaluation of pain to RLQ.  Has known "cyst and mass" on R ovary.  Has pain at baseline.  Today pain is worse and different.  C/o "bloating and fullness to that area."  Has had vomiting.

## 2021-12-02 MED ORDER — ALUM & MAG HYDROXIDE-SIMETH 200-200-20 MG/5ML PO SUSP
30.0000 mL | Freq: Once | ORAL | Status: AC
  Administered 2021-12-02: 30 mL via ORAL
  Filled 2021-12-02: qty 30

## 2021-12-02 MED ORDER — HYOSCYAMINE SULFATE 0.125 MG SL SUBL
0.2500 mg | SUBLINGUAL_TABLET | Freq: Once | SUBLINGUAL | Status: AC
  Administered 2021-12-02: 0.25 mg via SUBLINGUAL
  Filled 2021-12-02: qty 2

## 2021-12-20 NOTE — H&P (Signed)
Natalie Thornton is an 36 y.o. female with [facility] and uterine fibroids.  Has not responded to medical therapy.  GYN U/S, vol 83 grams, fibroids 3-4 cm.  Nulligravid    Menstrual History: Menarche age: 34 No LMP recorded.    Past Medical History:  Diagnosis Date   Anxiety    Arthritis    Chronic back pain    DDD (degenerative disc disease)    Degeneration of intervertebral disc of lumbar region 07/30/2019   Degenerative disc disease, cervical    Disc disease, degenerative, cervical    H pylori ulcer    H/O degenerative disc disease 2013   History of hiatal hernia    Low back pain    Numbness    Whole back    Pilonidal cyst     Past Surgical History:  Procedure Laterality Date   COLONOSCOPY     LIPOMA EXCISION Right 08/10/2018   Procedure: EXCISION LIPOMA, RIGHT;  Surgeon: Fredirick Maudlin, MD;  Location: [facility] ORS;  Service: General;  Laterality: Right;    Family History  Problem Relation Age of Onset   Arthritis Paternal Grandfather    Head & neck cancer Paternal Grandmother        Back cancer   Lymphoma Paternal Grandmother    Arthritis Paternal Grandmother    AAA (abdominal aortic aneurysm) Maternal Grandmother    Leukemia Maternal Grandmother    Breast cancer Maternal Grandmother    Healthy Father    Hypertension Mother    Healthy Mother    Cirrhosis Maternal Uncle    Breast cancer Other    Colon cancer Neg Hx    Esophageal cancer Neg Hx    Rectal cancer Neg Hx    Stomach cancer Neg Hx     Social History:  reports that she has been smoking cigars and cigarettes. She has been smoking an average of 1 pack per day. She has never used smokeless tobacco. She reports current alcohol use. She reports that she does not use drugs.  Allergies: No Known Allergies  No medications prior to admission.    Review of Systems  Constitutional: Negative.   Respiratory: Negative.    Cardiovascular: Negative.   Gastrointestinal: Negative.   Genitourinary:  Positive for  menstrual problem.    There were no vitals taken for this visit. Physical Exam Constitutional:      Appearance: Normal appearance.  Cardiovascular:     Rate and Rhythm: Normal rate and regular rhythm.  Pulmonary:     Effort: Pulmonary effort is normal.     Breath sounds: Normal breath sounds.  Abdominal:     General: Bowel sounds are normal.     Palpations: Abdomen is soft.  Genitourinary:    Comments: Normal EGBUS, uterus small, mobile, no masses    No results found for this or any previous visit (from the past 24 hour(s)).  No results found.  Assessment/Plan: [facility] with uterine fibroids  Desires definite therapy. TVH with possible BS reviewed with pt. R/B/Post op care discussed. Pt verbalized understanding and desires to proceed.   Chancy Milroy 12/20/2021, 12:52 PM

## 2021-12-27 ENCOUNTER — Other Ambulatory Visit: Payer: Self-pay

## 2021-12-27 ENCOUNTER — Encounter (HOSPITAL_COMMUNITY): Payer: Self-pay | Admitting: Obstetrics and Gynecology

## 2021-12-27 NOTE — Progress Notes (Signed)
SDW CALL  Patient was given pre-op instructions over the phone. The opportunity was given for the patient to ask questions. No further questions asked. Patient verbalized understanding of instructions given.   PCP - does not currently have one Cardiologist - denies  PPM/ICD - denies   Chest x-ray - denies EKG - 06/08/21 Stress Test - n/a ECHO - n/a Cardiac Cath - n/a  Sleep Study - denies    Blood Thinner Instructions: n/a Aspirin Instructions: n/a  ERAS Protcol - clears until 3 hours prior - will stop clears at 1130  COVID TEST- n/a   Anesthesia review: no  Patient denies shortness of breath, fever, cough and chest pain over the phone call   All instructions explained to the patient, with a verbal understanding of the material. Patient agrees to go over the instructions while at home for a better understanding.

## 2021-12-28 ENCOUNTER — Observation Stay (HOSPITAL_COMMUNITY)
Admission: RE | Admit: 2021-12-28 | Discharge: 2021-12-29 | Disposition: A | Discharge status: Home / Self Care | Payer: Commercial Managed Care - HMO | Attending: Obstetrics and Gynecology | Admitting: Obstetrics and Gynecology

## 2021-12-28 ENCOUNTER — Observation Stay (HOSPITAL_COMMUNITY): Payer: Commercial Managed Care - HMO

## 2021-12-28 ENCOUNTER — Other Ambulatory Visit: Payer: Self-pay

## 2021-12-28 ENCOUNTER — Encounter (HOSPITAL_COMMUNITY): Payer: Self-pay | Admitting: Obstetrics and Gynecology

## 2021-12-28 ENCOUNTER — Observation Stay (HOSPITAL_BASED_OUTPATIENT_CLINIC_OR_DEPARTMENT_OTHER): Payer: Commercial Managed Care - HMO

## 2021-12-28 ENCOUNTER — Encounter (HOSPITAL_COMMUNITY)
Admission: RE | Disposition: A | Discharge status: Home / Self Care | Payer: Self-pay | Source: Home / Self Care | Attending: Obstetrics and Gynecology

## 2021-12-28 DIAGNOSIS — N3289 Other specified disorders of bladder: Secondary | ICD-10-CM | POA: Insufficient documentation

## 2021-12-28 DIAGNOSIS — N920 Excessive and frequent menstruation with regular cycle: Secondary | ICD-10-CM

## 2021-12-28 DIAGNOSIS — D251 Intramural leiomyoma of uterus: Principal | ICD-10-CM | POA: Insufficient documentation

## 2021-12-28 DIAGNOSIS — F17210 Nicotine dependence, cigarettes, uncomplicated: Secondary | ICD-10-CM | POA: Diagnosis not present

## 2021-12-28 DIAGNOSIS — N72 Inflammatory disease of cervix uteri: Secondary | ICD-10-CM | POA: Diagnosis not present

## 2021-12-28 DIAGNOSIS — D282 Benign neoplasm of uterine tubes and ligaments: Secondary | ICD-10-CM | POA: Insufficient documentation

## 2021-12-28 DIAGNOSIS — N8501 Benign endometrial hyperplasia: Secondary | ICD-10-CM | POA: Insufficient documentation

## 2021-12-28 DIAGNOSIS — D259 Leiomyoma of uterus, unspecified: Secondary | ICD-10-CM | POA: Diagnosis not present

## 2021-12-28 DIAGNOSIS — Z9889 Other specified postprocedural states: Secondary | ICD-10-CM

## 2021-12-28 HISTORY — PX: VAGINAL HYSTERECTOMY: SHX2639

## 2021-12-28 LAB — CBC
HCT: 39.9 % (ref 36.0–46.0)
Hemoglobin: 13.2 g/dL (ref 12.0–15.0)
MCH: 30.8 pg (ref 26.0–34.0)
MCHC: 33.1 g/dL (ref 30.0–36.0)
MCV: 93.2 fL (ref 80.0–100.0)
Platelets: 267 10*3/uL (ref 150–400)
RBC: 4.28 MIL/uL (ref 3.87–5.11)
RDW: 12.6 % (ref 11.5–15.5)
WBC: 3.8 10*3/uL — ABNORMAL LOW (ref 4.0–10.5)
nRBC: 0 % (ref 0.0–0.2)

## 2021-12-28 LAB — BASIC METABOLIC PANEL
Anion gap: 9 (ref 5–15)
BUN: 7 mg/dL (ref 6–20)
CO2: 28 mmol/L (ref 22–32)
Calcium: 9.5 mg/dL (ref 8.9–10.3)
Chloride: 104 mmol/L (ref 98–111)
Creatinine, Ser: 0.86 mg/dL (ref 0.44–1.00)
GFR, Estimated: >60 mL/min (ref >60)
Glucose, Bld: 104 mg/dL — ABNORMAL HIGH (ref 70–99)
Potassium: 3.9 mmol/L (ref 3.5–5.1)
Sodium: 141 mmol/L (ref 135–145)

## 2021-12-28 LAB — TYPE AND SCREEN
ABO/RH(D): B POS
Antibody Screen: NEGATIVE

## 2021-12-28 LAB — ABO/RH: ABO/RH(D): B POS

## 2021-12-28 LAB — POCT PREGNANCY, URINE: Preg Test, Ur: NEGATIVE

## 2021-12-28 SURGERY — HYSTERECTOMY, VAGINAL
Anesthesia: General | Site: Abdomen

## 2021-12-28 MED ORDER — HYDROMORPHONE HCL 1 MG/ML IJ SOLN
0.2500 mg | INTRAMUSCULAR | Status: DC | PRN
  Administered 2021-12-28: 0.25 mg via INTRAVENOUS
  Administered 2021-12-28 (×3): 0.5 mg via INTRAVENOUS
  Administered 2021-12-28: 0.25 mg via INTRAVENOUS

## 2021-12-28 MED ORDER — KETOROLAC TROMETHAMINE 15 MG/ML IJ SOLN: 15.0000 mg | INTRAMUSCULAR | Status: AC

## 2021-12-28 MED ORDER — ACETAMINOPHEN 500 MG PO TABS: 1000.0000 mg | ORAL_TABLET | ORAL | Status: AC

## 2021-12-28 MED ORDER — KETOROLAC TROMETHAMINE 30 MG/ML IJ SOLN
30.0000 mg | Freq: Four times a day (QID) | INTRAMUSCULAR | Status: DC
  Administered 2021-12-29 (×2): 30 mg via INTRAVENOUS
  Filled 2021-12-28 (×2): qty 1

## 2021-12-28 MED ORDER — DEXAMETHASONE SODIUM PHOSPHATE 10 MG/ML IJ SOLN
INTRAMUSCULAR | Status: AC
  Filled 2021-12-28: qty 2

## 2021-12-28 MED ORDER — MIDAZOLAM HCL 2 MG/2ML IJ SOLN
INTRAMUSCULAR | Status: DC | PRN
  Administered 2021-12-28: 2 mg via INTRAVENOUS

## 2021-12-28 MED ORDER — ZOLPIDEM TARTRATE 5 MG PO TABS
5.0000 mg | ORAL_TABLET | Freq: Every evening | ORAL | Status: DC | PRN
  Administered 2021-12-28: 5 mg via ORAL
  Filled 2021-12-28: qty 1

## 2021-12-28 MED ORDER — ROCURONIUM BROMIDE 10 MG/ML (PF) SYRINGE
PREFILLED_SYRINGE | INTRAVENOUS | Status: AC
  Filled 2021-12-28: qty 40

## 2021-12-28 MED ORDER — POVIDONE-IODINE 10 % EX SWAB
2.0000 | Freq: Once | CUTANEOUS | Status: AC
  Administered 2021-12-28: 2 via TOPICAL

## 2021-12-28 MED ORDER — ONDANSETRON HCL 4 MG/2ML IJ SOLN
INTRAMUSCULAR | Status: AC
  Filled 2021-12-28: qty 4

## 2021-12-28 MED ORDER — ACETAMINOPHEN 10 MG/ML IV SOLN
INTRAVENOUS | Status: AC
  Filled 2021-12-28: qty 100

## 2021-12-28 MED ORDER — ONDANSETRON HCL 4 MG PO TABS: 4.0000 mg | ORAL_TABLET | Freq: Four times a day (QID) | ORAL | Status: DC | PRN

## 2021-12-28 MED ORDER — ONDANSETRON HCL 4 MG/2ML IJ SOLN: 4.0000 mg | Freq: Four times a day (QID) | INTRAMUSCULAR | Status: DC | PRN

## 2021-12-28 MED ORDER — FENTANYL CITRATE (PF) 100 MCG/2ML IJ SOLN
INTRAMUSCULAR | Status: AC
  Filled 2021-12-28: qty 2

## 2021-12-28 MED ORDER — KETOROLAC TROMETHAMINE 30 MG/ML IJ SOLN
INTRAMUSCULAR | Status: DC | PRN
  Administered 2021-12-28: 15 mg via INTRAVENOUS

## 2021-12-28 MED ORDER — PROPOFOL 10 MG/ML IV BOLUS
INTRAVENOUS | Status: AC
  Filled 2021-12-28: qty 20

## 2021-12-28 MED ORDER — FENTANYL CITRATE (PF) 100 MCG/2ML IJ SOLN
25.0000 ug | INTRAMUSCULAR | Status: DC | PRN
  Administered 2021-12-28 (×3): 50 ug via INTRAVENOUS

## 2021-12-28 MED ORDER — KETOROLAC TROMETHAMINE 30 MG/ML IJ SOLN
INTRAMUSCULAR | Status: AC
  Filled 2021-12-28: qty 1

## 2021-12-28 MED ORDER — FENTANYL CITRATE (PF) 250 MCG/5ML IJ SOLN
INTRAMUSCULAR | Status: AC
  Filled 2021-12-28: qty 5

## 2021-12-28 MED ORDER — CEFAZOLIN SODIUM-DEXTROSE 2-4 GM/100ML-% IV SOLN
INTRAVENOUS | Status: AC
  Filled 2021-12-28: qty 100

## 2021-12-28 MED ORDER — ORAL CARE MOUTH RINSE
15.0000 mL | Freq: Once | OROMUCOSAL | Status: AC
Start: 2021-12-28 — End: 2021-12-28

## 2021-12-28 MED ORDER — HYDROMORPHONE HCL 1 MG/ML IJ SOLN
INTRAMUSCULAR | Status: AC
  Filled 2021-12-28: qty 1

## 2021-12-28 MED ORDER — DEXAMETHASONE SODIUM PHOSPHATE 10 MG/ML IJ SOLN
INTRAMUSCULAR | Status: DC | PRN
  Administered 2021-12-28: 4 mg via INTRAVENOUS

## 2021-12-28 MED ORDER — SIMETHICONE 80 MG PO CHEW: 80.0000 mg | CHEWABLE_TABLET | Freq: Four times a day (QID) | ORAL | Status: DC | PRN

## 2021-12-28 MED ORDER — MIDAZOLAM HCL 2 MG/2ML IJ SOLN
INTRAMUSCULAR | Status: AC
  Filled 2021-12-28: qty 2

## 2021-12-28 MED ORDER — LACTATED RINGERS IV SOLN: INTRAVENOUS | Status: DC

## 2021-12-28 MED ORDER — CEFAZOLIN SODIUM-DEXTROSE 2-4 GM/100ML-% IV SOLN
2.0000 g | INTRAVENOUS | Status: AC
  Administered 2021-12-28: 2 g via INTRAVENOUS

## 2021-12-28 MED ORDER — OXYCODONE-ACETAMINOPHEN 5-325 MG PO TABS
1.0000 | ORAL_TABLET | ORAL | Status: DC | PRN
  Administered 2021-12-29: 1 via ORAL
  Filled 2021-12-28: qty 1

## 2021-12-28 MED ORDER — HYDROMORPHONE HCL 1 MG/ML IJ SOLN
1.0000 mg | Freq: Once | INTRAMUSCULAR | Status: AC
  Administered 2021-12-28: 1 mg via INTRAVENOUS
  Filled 2021-12-28: qty 1

## 2021-12-28 MED ORDER — ACETAMINOPHEN 10 MG/ML IV SOLN
1000.0000 mg | Freq: Once | INTRAVENOUS | Status: AC
Start: 2021-12-28 — End: 2021-12-28
  Administered 2021-12-28: 1000 mg via INTRAVENOUS

## 2021-12-28 MED ORDER — METHOCARBAMOL 1000 MG/10ML IJ SOLN
500.0000 mg | Freq: Once | INTRAVENOUS | Status: AC
  Administered 2021-12-28: 500 mg via INTRAVENOUS
  Filled 2021-12-28: qty 5

## 2021-12-28 MED ORDER — HYDROMORPHONE HCL 1 MG/ML IJ SOLN: 1.0000 mg | INTRAMUSCULAR | Status: DC | PRN

## 2021-12-28 MED ORDER — LACTATED RINGERS IV SOLN
INTRAVENOUS | Status: DC
  Administered 2021-12-29: 125 mL/h via INTRAVENOUS

## 2021-12-28 MED ORDER — FENTANYL CITRATE (PF) 250 MCG/5ML IJ SOLN
INTRAMUSCULAR | Status: DC | PRN
  Administered 2021-12-28: 50 ug via INTRAVENOUS
  Administered 2021-12-28: 100 ug via INTRAVENOUS
  Administered 2021-12-28 (×2): 50 ug via INTRAVENOUS

## 2021-12-28 MED ORDER — CHLORHEXIDINE GLUCONATE 0.12 % MT SOLN: 15.0000 mL | Freq: Once | OROMUCOSAL | Status: AC

## 2021-12-28 MED ORDER — METHOCARBAMOL 500 MG PO TABS
ORAL_TABLET | ORAL | Status: AC
  Filled 2021-12-28: qty 1

## 2021-12-28 MED ORDER — ROCURONIUM BROMIDE 10 MG/ML (PF) SYRINGE
PREFILLED_SYRINGE | INTRAVENOUS | Status: DC | PRN
  Administered 2021-12-28: 50 mg via INTRAVENOUS

## 2021-12-28 MED ORDER — 0.9 % SODIUM CHLORIDE (POUR BTL) OPTIME
TOPICAL | Status: DC | PRN
  Administered 2021-12-28: 1000 mL

## 2021-12-28 MED ORDER — SODIUM CHLORIDE 0.9 % IV SOLN
25.0000 mg | Freq: Once | INTRAVENOUS | Status: AC
  Administered 2021-12-28: 25 mg via INTRAVENOUS
  Filled 2021-12-28: qty 1

## 2021-12-28 MED ORDER — IBUPROFEN 800 MG PO TABS: 800.0000 mg | ORAL_TABLET | Freq: Three times a day (TID) | ORAL | Status: DC

## 2021-12-28 MED ORDER — KETOROLAC TROMETHAMINE 30 MG/ML IJ SOLN
INTRAMUSCULAR | Status: AC
  Filled 2021-12-28: qty 2

## 2021-12-28 MED ORDER — SUGAMMADEX SODIUM 200 MG/2ML IV SOLN
INTRAVENOUS | Status: DC | PRN
  Administered 2021-12-28: 200 mg via INTRAVENOUS

## 2021-12-28 MED ORDER — LIDOCAINE 2% (20 MG/ML) 5 ML SYRINGE
INTRAMUSCULAR | Status: DC | PRN
  Administered 2021-12-28: 60 mg via INTRAVENOUS

## 2021-12-28 MED ORDER — AMISULPRIDE (ANTIEMETIC) 5 MG/2ML IV SOLN: 10.0000 mg | Freq: Once | INTRAVENOUS | Status: DC | PRN

## 2021-12-28 MED ORDER — ACETAMINOPHEN 500 MG PO TABS
ORAL_TABLET | ORAL | Status: AC
  Administered 2021-12-28: 1000 mg via ORAL
  Filled 2021-12-28: qty 2

## 2021-12-28 MED ORDER — SOD CITRATE-CITRIC ACID 500-334 MG/5ML PO SOLN
ORAL | Status: AC
  Administered 2021-12-28: 30 mL via ORAL
  Filled 2021-12-28: qty 30

## 2021-12-28 MED ORDER — PROPOFOL 10 MG/ML IV BOLUS
INTRAVENOUS | Status: DC | PRN
  Administered 2021-12-28: 200 mg via INTRAVENOUS

## 2021-12-28 MED ORDER — SOD CITRATE-CITRIC ACID 500-334 MG/5ML PO SOLN: 30.0000 mL | ORAL | Status: AC

## 2021-12-28 MED ORDER — SODIUM CHLORIDE 0.9 % IV SOLN: 1.0000 mg/h | Freq: Once | INTRAVENOUS | Status: DC

## 2021-12-28 MED ORDER — PHENYLEPHRINE 80 MCG/ML (10ML) SYRINGE FOR IV PUSH (FOR BLOOD PRESSURE SUPPORT)
PREFILLED_SYRINGE | INTRAVENOUS | Status: AC
  Filled 2021-12-28: qty 10

## 2021-12-28 MED ORDER — CHLORHEXIDINE GLUCONATE 0.12 % MT SOLN
OROMUCOSAL | Status: AC
  Administered 2021-12-28: 15 mL via OROMUCOSAL
  Filled 2021-12-28: qty 15

## 2021-12-28 MED ORDER — KETOROLAC TROMETHAMINE 30 MG/ML IJ SOLN
30.0000 mg | Freq: Four times a day (QID) | INTRAMUSCULAR | Status: DC
  Administered 2021-12-28: 30 mg via INTRAVENOUS

## 2021-12-28 MED ORDER — OXYCODONE-ACETAMINOPHEN 5-325 MG PO TABS
2.0000 | ORAL_TABLET | ORAL | Status: DC | PRN
  Administered 2021-12-28 – 2021-12-29 (×2): 2 via ORAL
  Filled 2021-12-28 (×2): qty 2

## 2021-12-28 MED ORDER — ONDANSETRON HCL 4 MG/2ML IJ SOLN
INTRAMUSCULAR | Status: DC | PRN
  Administered 2021-12-28: 4 mg via INTRAVENOUS

## 2021-12-28 MED ORDER — SENNA 8.6 MG PO TABS
1.0000 | ORAL_TABLET | Freq: Two times a day (BID) | ORAL | Status: DC
Start: 2021-12-28 — End: 2021-12-29
  Administered 2021-12-28: 8.6 mg via ORAL
  Filled 2021-12-28 (×2): qty 1

## 2021-12-28 MED ORDER — KETOROLAC TROMETHAMINE 15 MG/ML IJ SOLN
INTRAMUSCULAR | Status: AC
  Administered 2021-12-28: 15 mg via INTRAVENOUS
  Filled 2021-12-28: qty 1

## 2021-12-28 MED ORDER — LIDOCAINE 2% (20 MG/ML) 5 ML SYRINGE
INTRAMUSCULAR | Status: AC
  Filled 2021-12-28: qty 15

## 2021-12-28 SURGICAL SUPPLY — 26 items
BAG COUNTER SPONGE SURGICOUNT (BAG) ×2 IMPLANT
BAG SPNG CNTER NS LX DISP (BAG) ×1
GAUZE 4X4 16PLY ~~LOC~~+RFID DBL (SPONGE) ×4 IMPLANT
GLOVE BIO SURGEON STRL SZ7.5 (GLOVE) ×2 IMPLANT
GLOVE BIOGEL PI IND STRL 8 (GLOVE) ×1 IMPLANT
GLOVE BIOGEL PI INDICATOR 8 (GLOVE) ×1
GLOVE SURG ORTHO 8.0 STRL STRW (GLOVE) ×2 IMPLANT
GLOVE SURG UNDER POLY LF SZ7 (GLOVE) ×2 IMPLANT
GOWN STRL REUS W/ TWL LRG LVL3 (GOWN DISPOSABLE) ×1 IMPLANT
GOWN STRL REUS W/ TWL XL LVL3 (GOWN DISPOSABLE) ×2 IMPLANT
GOWN STRL REUS W/TWL LRG LVL3 (GOWN DISPOSABLE) ×4
GOWN STRL REUS W/TWL XL LVL3 (GOWN DISPOSABLE) ×4
HIBICLENS CHG 4% 4OZ BTL (MISCELLANEOUS) ×2 IMPLANT
KIT TURNOVER KIT B (KITS) ×2 IMPLANT
MANIFOLD NEPTUNE II (INSTRUMENTS) ×2 IMPLANT
NS IRRIG 1000ML POUR BTL (IV SOLUTION) ×2 IMPLANT
PACK VAGINAL WOMENS (CUSTOM PROCEDURE TRAY) ×2 IMPLANT
PAD OB MATERNITY 4.3X12.25 (PERSONAL CARE ITEMS) ×2 IMPLANT
SUT VIC AB 2-0 CT1 18 (SUTURE) ×2 IMPLANT
SUT VIC AB 2-0 CT1 27 (SUTURE) ×2
SUT VIC AB 2-0 CT1 TAPERPNT 27 (SUTURE) ×1 IMPLANT
SUT VIC AB PLUS 45CM 1-MO-4 (SUTURE) ×4 IMPLANT
SUT VICRYL 1 TIES 12X18 (SUTURE) ×2 IMPLANT
TOWEL GREEN STERILE FF (TOWEL DISPOSABLE) ×2 IMPLANT
TRAY FOLEY W/BAG SLVR 14FR (SET/KITS/TRAYS/PACK) ×2 IMPLANT
UNDERPAD 30X36 HEAVY ABSORB (UNDERPADS AND DIAPERS) ×2 IMPLANT

## 2021-12-28 NOTE — Progress Notes (Signed)
Family member stated patient has been dozing off and on since analgesia given each time. When asked about pain score, it's always 8-10 even though she is eating and drinking every thing she can get.

## 2021-12-28 NOTE — Op Note (Signed)
Natalie Thornton PROCEDURE DATE: 12/28/2021  PREOPERATIVE DIAGNOSIS:  Symptomatic fibroids, menorrhagia POSTOPERATIVE DIAGNOSIS:  Symptomatic fibroids, menorrhagia SURGEON:   Arlina Robes M.D. ASSISTANT: Darron Doom, M.D.  An experienced assistant was required given the standard of surgical care given the complexity of the case.  This assistant was needed for exposure, dissection, suctioning, and for overall help during the procedure.   OPERATION:  Total Vaginal hysterectomy with Bilateral salpingectomy ANESTHESIA:  General endotracheal.  INDICATIONS: The patient is a 36 y.o. G0P0000 with history of symptomatic uterine fibroids/menorrhagia. The patient made a decision to undergo definite surgical treatment. On the preoperative visit, the risks, benefits, indications, and alternatives of the procedure were reviewed with the patient.  On the day of surgery, the risks of surgery were again discussed with the patient including but not limited to: bleeding which may require transfusion or reoperation; infection which may require antibiotics; injury to bowel, bladder, ureters or other surrounding organs; need for additional procedures; thromboembolic phenomenon, incisional problems and other postoperative/anesthesia complications. Written informed consent was obtained.    OPERATIVE FINDINGS: A 6 week size uterus with normal tubes and ovaries bilaterally.  ESTIMATED BLOOD LOSS: 125 ml FLUIDS:  As recorded URINE OUTPUT:  As recorded SPECIMENS:  Uterus and cervix and tubes sent to pathology COMPLICATIONS:  None immediate.  DESCRIPTION OF PROCEDURE:  The patient received intravenous antibiotics and had sequential compression devices applied to her lower extremities while in the preoperative area.  She was then taken to the operating room where general anesthesia was administered and was found to be adequate.  She was placed in the dorsal lithotomy position, and was prepped and draped in a sterile  manner.  A Foley catheter was inserted into her bladder and attached to constant drainage. After an adequate timeout was performed, attention was turned to her pelvis.  A weighted speculum was then placed in the vagina, and the posterior lips of the cervix were grasped bilaterally with tenaculums.  The posterior cul de sac was entered sharply. Long bill weighted was placed. The anterior lip the cervix was grasped with a tenaculum. The cervix was sharply circumferentially incised. The anterior cul de sac was entered sharply. Deaver was replaced.  Zeplin clamps were then used to clamp the uterosacral ligaments on either side.  They were then cut and sutured ligated with 0 Vicryl.  Of note, all sutures used in this case were 0 Vicryl unless otherwise noted.   The cardinal ligaments were then clamped, cut and ligated. The uterine vessels and broad ligaments were then serially clamped with the Heaney clamps, cut, and suture ligated on both sides. The final pedicle involving the uteroovarian was clamped, cut and ligated with a free tie and then in a [place] fashion. This was repeated on the other side. The uterus was then passed off for pathological evaluation.  Excellent hemostasis was noted. Each fallopian tube was identified and grasped with a Babcock. The mesosalpinx was clamped and the tube was cut, ligated with a free tie. Repeated on the other side. Each tube was passed off for pathology. All pedicles were then reviewed to ensure hemostasis.  The perineum was then closed in a purse string fashion.  The vaginal cuff was then closed with 2/0 Vicryl.  All instruments were then removed from the pelvis. The patient tolerated the procedure well.  All instruments, needles, and sponge counts were correct x 2. The patient was taken to the recovery room in stable condition.    Arlina Robes, MD, Cherlynn June  Attending [place], [place]

## 2021-12-28 NOTE — Interval H&P Note (Signed)
History and Physical Interval Note:  12/28/2021 3:39 PM  Natalie Thornton  has presented today for surgery, with the diagnosis of Fibroids.  The various methods of treatment have been discussed with the patient and family. After consideration of risks, benefits and other options for treatment, the patient has consented to  Procedure(s): HYSTERECTOMY VAGINAL WITH SALPINGECTOMY (N/A) as a surgical intervention.  The patient's history has been reviewed, patient examined, no change in status, stable for surgery.  I have reviewed the patient's chart and labs.  Questions were answered to the patient's satisfaction.     Chancy Milroy

## 2021-12-28 NOTE — Anesthesia Procedure Notes (Signed)
Procedure Name: Intubation Date/Time: 12/28/2021 4:20 PM  Performed by: Suzette Battiest, MDPre-anesthesia Checklist: Patient identified, Emergency Drugs available, Suction available and Patient being monitored Patient Re-evaluated:Patient Re-evaluated prior to induction Oxygen Delivery Method: Circle system utilized Preoxygenation: Pre-oxygenation with 100% oxygen Induction Type: IV induction Ventilation: Mask ventilation without difficulty Laryngoscope Size: Mac and 3 Grade View: Grade I Tube type: Oral Tube size: 7.0 mm Number of attempts: 1 Airway Equipment and Method: Stylet and Oral airway Placement Confirmation: ETT inserted through vocal cords under direct vision, positive ETCO2 and breath sounds checked- equal and bilateral Secured at: 20 cm Tube secured with: Tape Dental Injury: Teeth and Oropharynx as per pre-operative assessment

## 2021-12-28 NOTE — Transfer of Care (Signed)
Immediate Anesthesia Transfer of Care Note  Patient: Natalie Thornton  Procedure(s) Performed: HYSTERECTOMY VAGINAL WITH BILATERAL SALPINGECTOMY (Abdomen)  Patient Location: PACU  Anesthesia Type:General  Level of Consciousness: drowsy, patient cooperative and responds to stimulation  Airway & Oxygen Therapy: Patient Spontanous Breathing  Post-op Assessment: Report given to RN and Post -op Vital signs reviewed and stable  Post vital signs: Reviewed and stable  Last Vitals:  Vitals Value Taken Time  BP 117/65 12/28/21 1726  Temp    Pulse 87 12/28/21 1729  Resp 15 12/28/21 1729  SpO2 99 % 12/28/21 1729  Vitals shown include unvalidated device data.  Last Pain:  Vitals:   12/28/21 1259  TempSrc:   PainSc: 7          Complications: No notable events documented.

## 2021-12-28 NOTE — Anesthesia Preprocedure Evaluation (Addendum)
Anesthesia Evaluation  Patient identified by MRN, date of birth, ID band Patient awake    Reviewed: Allergy & Precautions, NPO status , Patient's Chart, lab work & pertinent test results  Airway Mallampati: I  TM Distance: >3 FB Neck ROM: Full    Dental  (+) Dental Advisory Given   Pulmonary neg pulmonary ROS, Current Smoker and Patient abstained from smoking.,    breath sounds clear to auscultation       Cardiovascular negative cardio ROS   Rhythm:Regular Rate:Normal     Neuro/Psych PSYCHIATRIC DISORDERS Anxiety Depression negative neurological ROS     GI/Hepatic negative GI ROS, Neg liver ROS,   Endo/Other  negative endocrine ROS  Renal/GU negative Renal ROS  negative genitourinary   Musculoskeletal  (+) Arthritis ,   Abdominal   Peds  Hematology negative hematology ROS (+)   Anesthesia Other Findings Uterine fibroids  Reproductive/Obstetrics                            Anesthesia Physical Anesthesia Plan  ASA: 2  Anesthesia Plan: General   Post-op Pain Management: Tylenol PO (pre-op)* and Toradol IV (intra-op)*   Induction: Intravenous  PONV Risk Score and Plan: 2 and Midazolam, Dexamethasone and Ondansetron  Airway Management Planned: Oral ETT  Additional Equipment: None  Intra-op Plan:   Post-operative Plan: Extubation in OR  Informed Consent: I have reviewed the patients History and Physical, chart, labs and discussed the procedure including the risks, benefits and alternatives for the proposed anesthesia with the patient or authorized representative who has indicated his/her understanding and acceptance.     Dental advisory given  Plan Discussed with: CRNA  Anesthesia Plan Comments:        Anesthesia Quick Evaluation

## 2021-12-29 ENCOUNTER — Encounter (HOSPITAL_COMMUNITY): Payer: Self-pay | Admitting: Obstetrics and Gynecology

## 2021-12-29 DIAGNOSIS — D251 Intramural leiomyoma of uterus: Secondary | ICD-10-CM | POA: Diagnosis not present

## 2021-12-29 LAB — CBC
HCT: 36.8 % (ref 36.0–46.0)
Hemoglobin: 12.4 g/dL (ref 12.0–15.0)
MCH: 31.3 pg (ref 26.0–34.0)
MCHC: 33.7 g/dL (ref 30.0–36.0)
MCV: 92.9 fL (ref 80.0–100.0)
Platelets: 301 10*3/uL (ref 150–400)
RBC: 3.96 MIL/uL (ref 3.87–5.11)
RDW: 12.3 % (ref 11.5–15.5)
WBC: 12.6 10*3/uL — ABNORMAL HIGH (ref 4.0–10.5)
nRBC: 0 % (ref 0.0–0.2)

## 2021-12-29 LAB — BASIC METABOLIC PANEL
Anion gap: 9 (ref 5–15)
BUN: 5 mg/dL — ABNORMAL LOW (ref 6–20)
CO2: 25 mmol/L (ref 22–32)
Calcium: 9.1 mg/dL (ref 8.9–10.3)
Chloride: 103 mmol/L (ref 98–111)
Creatinine, Ser: 0.97 mg/dL (ref 0.44–1.00)
GFR, Estimated: >60 mL/min (ref >60)
Glucose, Bld: 118 mg/dL — ABNORMAL HIGH (ref 70–99)
Potassium: 3.8 mmol/L (ref 3.5–5.1)
Sodium: 137 mmol/L (ref 135–145)

## 2021-12-29 MED ORDER — OXYCODONE-ACETAMINOPHEN 5-325 MG PO TABS: 1.0000 | ORAL_TABLET | ORAL | 0 refills | Status: DC | PRN

## 2021-12-29 MED ORDER — IBUPROFEN 800 MG PO TABS: 800.0000 mg | ORAL_TABLET | Freq: Three times a day (TID) | ORAL | 0 refills | Status: DC

## 2021-12-29 NOTE — Progress Notes (Signed)
Patient discharged to home with mother. Condition stable.  Patient to car via wheelchair with [place] Neal, NT.  No equipment for home order at discharge.

## 2021-12-29 NOTE — Discharge Summary (Signed)
Physician Discharge Summary  Patient ID: Natalie Thornton MRN: [id number] DOB/AGE: 11-Jul-1985 36 y.o.  Admit date: 12/28/2021 Discharge date: 12/29/2021  Admission Diagnoses: Menorrhagia and uterine fibroids  Discharge Diagnoses:  Principal Problem:   Post-operative state   Discharged Condition: good  Hospital Course: Natalie Thornton was admitted with above Dx. She underwent TVH with BS without problems. See OP note for additional information.  Pt progressed without to ambulating, voiding, tolerating and good oral pain control. Felt amendable for discharge home. Discharge medications, instructions and follow up reviewed with pt. Pt verbalized understanding.  Consults: None  Significant Diagnostic Studies: labs  Treatments: surgery: TVH/BS  Discharge Exam: Blood pressure (!) 106/53, pulse 67, temperature 98.2 F (36.8 C), temperature source Oral, resp. rate 15, height '4\' 11"'$  (1.499 m), weight 59 kg, last menstrual period 12/13/2021, SpO2 98 %.  Lungs clear Heart RRR Abd soft + BS GU no bleeding Ext non tender   Disposition: Discharge disposition: 01-Home or Self Care       Discharge Instructions     Call MD for:  difficulty breathing, headache or visual disturbances   Complete by: As directed    Call MD for:  extreme fatigue   Complete by: As directed    Call MD for:  hives   Complete by: As directed    Call MD for:  persistant dizziness or light-headedness   Complete by: As directed    Call MD for:  persistant nausea and vomiting   Complete by: As directed    Call MD for:  redness, tenderness, or signs of infection (pain, swelling, redness, odor or green/yellow discharge around incision site)   Complete by: As directed    Call MD for:  severe uncontrolled pain   Complete by: As directed    Call MD for:  temperature >100.4   Complete by: As directed    Diet - low sodium heart healthy   Complete by: As directed    Increase activity slowly   Complete by: As  directed    Sexual Activity Restrictions   Complete by: As directed    Pelvic rest x 4 weeks      Allergies as of 12/29/2021   No Known Allergies      Medication List     STOP taking these medications    amoxicillin-clavulanate 875-125 MG tablet Commonly known as: AUGMENTIN   dicyclomine 10 MG capsule Commonly known as: BENTYL   famotidine 20 MG tablet Commonly known as: PEPCID   hydrocortisone cream 1 %   naproxen 500 MG EC tablet Commonly known as: EC-Naprosyn   norethindrone 5 MG tablet Commonly known as: AYGESTIN   traMADol 50 MG tablet Commonly known as: ULTRAM       TAKE these medications    ibuprofen 800 MG tablet Commonly known as: ADVIL Take 1 tablet (800 mg total) by mouth every 8 (eight) hours.   oxyCODONE-acetaminophen 5-325 MG tablet Commonly known as: PERCOCET/ROXICET Take 1 tablet by mouth every 4 (four) hours as needed for moderate pain. What changed:  when to take this reasons to take this        [place]. Schedule an appointment as soon as possible for a visit in 4 week(s).   Why: Please make a face to face post op appt with Dr. Gardiner Fanti Contact information: [address] [place] [id number] [phone]  Signed: Chancy Milroy 12/29/2021, 7:41 AM

## 2021-12-29 NOTE — Anesthesia Postprocedure Evaluation (Signed)
Anesthesia Post Note  Patient: PRESLEIGH FELDSTEIN  Procedure(s) Performed: HYSTERECTOMY VAGINAL WITH BILATERAL SALPINGECTOMY (Abdomen)     Patient location during evaluation: PACU Anesthesia Type: General Level of consciousness: awake and alert Pain management: pain level controlled Vital Signs Assessment: post-procedure vital signs reviewed and stable Respiratory status: spontaneous breathing, nonlabored ventilation, respiratory function stable and patient connected to nasal cannula oxygen Cardiovascular status: blood pressure returned to baseline and stable Postop Assessment: no apparent nausea or vomiting Anesthetic complications: no   No notable events documented.  Last Vitals:  Vitals:   12/29/21 0312 12/29/21 0752  BP: (!) 106/53 (!) 102/52  Pulse: 67 79  Resp: 15 16  Temp: 36.8 C 37.1 C  SpO2: 98% 99%    Last Pain:  Vitals:   12/29/21 0755  TempSrc:   PainSc: 4                  Tiajuana Amass

## 2021-12-30 LAB — SURGICAL PATHOLOGY

## 2022-01-04 ENCOUNTER — Other Ambulatory Visit: Payer: Self-pay

## 2022-01-04 ENCOUNTER — Encounter: Payer: Self-pay | Admitting: Obstetrics and Gynecology

## 2022-01-04 ENCOUNTER — Other Ambulatory Visit (INDEPENDENT_AMBULATORY_CARE_PROVIDER_SITE_OTHER): Payer: Self-pay | Admitting: Obstetrics and Gynecology

## 2022-01-04 MED ORDER — KETOROLAC TROMETHAMINE 10 MG PO TABS
15.0000 mg | ORAL_TABLET | Freq: Three times a day (TID) | ORAL | 0 refills | Status: DC | PRN
Start: 2022-01-04 — End: 2022-02-02

## 2022-01-11 ENCOUNTER — Telehealth: Payer: Self-pay | Admitting: Emergency Medicine

## 2022-01-11 ENCOUNTER — Encounter: Payer: Self-pay | Admitting: Obstetrics and Gynecology

## 2022-01-11 NOTE — Telephone Encounter (Signed)
RC to patient. Pt reports copious amounts of discharge and describes and "pus-like" that is blood tinged. Also reports strong vaginal odor along with pelvic pain.  Apt overbooked with Dr. Gardiner Fanti, MD tomorrow for evaluation. Pt made aware.

## 2022-01-12 ENCOUNTER — Encounter: Payer: Self-pay | Admitting: Obstetrics and Gynecology

## 2022-01-12 ENCOUNTER — Ambulatory Visit (INDEPENDENT_AMBULATORY_CARE_PROVIDER_SITE_OTHER): Payer: Commercial Managed Care - HMO | Admitting: Obstetrics and Gynecology

## 2022-01-12 VITALS — BP 127/83 | HR 85 | Wt 124.0 lb

## 2022-01-12 DIAGNOSIS — Z9079 Acquired absence of other genital organ(s): Secondary | ICD-10-CM

## 2022-01-12 DIAGNOSIS — N760 Acute vaginitis: Secondary | ICD-10-CM | POA: Insufficient documentation

## 2022-01-12 DIAGNOSIS — Z9889 Other specified postprocedural states: Secondary | ICD-10-CM

## 2022-01-12 DIAGNOSIS — Z90710 Acquired absence of both cervix and uterus: Secondary | ICD-10-CM | POA: Insufficient documentation

## 2022-01-12 MED ORDER — METRONIDAZOLE 500 MG PO TABS: 500.0000 mg | ORAL_TABLET | Freq: Two times a day (BID) | ORAL | 0 refills | Status: DC

## 2022-01-12 NOTE — Patient Instructions (Signed)
Vaginal Hysterectomy, Care After The following information offers guidance on how to care for yourself after your procedure. Your health care provider may also give you more specific instructions. If you have problems or questions, contact your health care provider. What can I expect after the procedure? After the procedure, it is common to have: Pain in the lower abdomen and vagina. Vaginal bleeding and discharge for up to 1 week. You will need to use a sanitary pad after this procedure. Difficulty having a bowel movement (constipation). Temporary problems emptying the bladder. Tiredness (fatigue). Poor appetite. Less interest in sex. Feelings of sadness or other emotions. If your ovaries were also removed, it is also common to have symptoms of menopause, such as hot flashes, night sweats, and lack of sleep (insomnia). Follow these instructions at home: Medicines  Take over-the-counter and prescription medicines only as told by your health care provider. Ask your health care provider if the medicine prescribed to you: Requires you to avoid driving or using machinery. Can cause constipation. You may need to take these actions to prevent or treat constipation: Drink enough fluid to keep your urine pale yellow. Take over-the-counter or prescription medicines. Eat foods that are high in fiber, such as beans, whole grains, and fresh fruits and vegetables. Limit foods that are high in fat and processed sugars, such as fried or sweet foods. Activity  Rest as told by your health care provider. Return to your normal activities as told by your health care provider. Ask your health care provider what activities are safe for you Avoid sitting for a long time without moving. Get up to take short walks every 1-2 hours. This is important to improve blood flow and breathing. Ask for help if you feel weak or unsteady. Try to have someone home with you for 1-2 weeks to help you with everyday chores. Do  not lift anything that is heavier than 10 lb (4.5 kg), or the limit that you are told, until your health care provider says that it is safe. If you were given a sedative during the procedure, it can affect you for several hours. Do not drive or operate machinery until your health care provider says that it is safe. Lifestyle Do not use any products that contain nicotine or tobacco. These products include cigarettes, chewing tobacco, and vaping devices, such as e-cigarettes. These can delay healing after surgery. If you need help quitting, ask your health care provider. Do not drink alcohol until your health care provider approves. General instructions Do not douche, use tampons, or have sex for at least 6 weeks, or as told by your health care provider. If you struggle with physical or emotional changes after your procedure, speak with your health care provider or a therapist. The stitches inside your vagina will dissolve over time and do not need to be taken out. Do not take baths, swim, or use a hot tub until your health care provider approves. You may only be allowed to take showers for 2-3 weeks Wear compression stockings as told by your health care provider. These stockings help to prevent blood clots and reduce swelling in your legs. Keep all follow-up visits. This is important. Contact a health care provider if: Your pain medicine is not helping. You have a fever. You have nausea or vomiting that does not go away. You feel dizzy. You have blood, pus, or a bad-smelling discharge from your vagina more than 1 week after the procedure. You continue to have trouble urinating 3-5  days after the procedure. Get help right away if: You have severe pain in your abdomen or back. You faint. You have heavy vaginal bleeding and blood clots, soaking through a sanitary pad in less than 1 hour. You have chest pain or shortness of breath. You have pain, swelling, or redness in your leg. These symptoms  may represent a serious problem that is an emergency. Do not wait to see if the symptoms will go away. Get medical help right away. Call your local emergency services (911 in the [place] Do not drive yourself to the hospital. Summary After the procedure, it is common to have pain, vaginal bleeding, constipation, temporary problems emptying your bladder, and feelings of sadness or other emotions. Take over-the-counter and prescription medicines only as told by your health care provider. Rest as told by your health care provider. Return to your normal activities as told by your health care provider. Contact a health care provider if your pain medicine is not helping, or you have a fever, dizziness, or trouble urinating several days after the procedure. Get help right away if you have severe pain in your abdomen or back, or if you faint, have heavy bleeding, or have chest pain or shortness of breath. This information is not intended to replace advice given to you by your health care provider. Make sure you discuss any questions you have with your health care provider. Document Revised: 08/04/2021 Document Reviewed: 01/24/2020 Else[employer] Patient Education  [employer].

## 2022-01-12 NOTE — Progress Notes (Signed)
Pt presents for post op problem visit. She reports malodorous vaginal discharge.  TVH with BS completed 12/28/21

## 2022-01-12 NOTE — Progress Notes (Signed)
Ms Frazee presents for post op problem S/P TVH/BS on 12/28/21 without problems Pt has noted some spotting and malodorous discharge for the last week No fever Some constipation  PE AF VSS Chaperone present Lungs clear Heart RRR Abd soft + BS GU NL EGBUS, cuff healing well, some initiation noted, scant yellow discharge, no masses, appropriately tender   A/P Post op vaginitis  Discussed with pt. Flagyl 500 mg po bid x 7 days Miralax for constipation Continue with pelvic rest Keep routine post op appt

## 2022-01-14 ENCOUNTER — Encounter: Payer: Self-pay | Admitting: Obstetrics and Gynecology

## 2022-01-21 ENCOUNTER — Other Ambulatory Visit: Payer: Self-pay

## 2022-01-21 ENCOUNTER — Emergency Department (HOSPITAL_BASED_OUTPATIENT_CLINIC_OR_DEPARTMENT_OTHER): Payer: Commercial Managed Care - HMO

## 2022-01-21 ENCOUNTER — Encounter: Payer: Self-pay | Admitting: Obstetrics and Gynecology

## 2022-01-21 ENCOUNTER — Encounter (HOSPITAL_BASED_OUTPATIENT_CLINIC_OR_DEPARTMENT_OTHER): Payer: Self-pay

## 2022-01-21 ENCOUNTER — Ambulatory Visit: Admission: EM | Admit: 2022-01-21 | Discharge: 2022-01-21 | Payer: Commercial Managed Care - HMO

## 2022-01-21 ENCOUNTER — Emergency Department (HOSPITAL_BASED_OUTPATIENT_CLINIC_OR_DEPARTMENT_OTHER)
Admission: EM | Admit: 2022-01-21 | Discharge: 2022-01-21 | Disposition: A | Discharge status: Home / Self Care | Payer: Commercial Managed Care - HMO | Attending: Emergency Medicine | Admitting: Emergency Medicine

## 2022-01-21 DIAGNOSIS — R1030 Lower abdominal pain, unspecified: Secondary | ICD-10-CM | POA: Diagnosis not present

## 2022-01-21 DIAGNOSIS — N898 Other specified noninflammatory disorders of vagina: Secondary | ICD-10-CM | POA: Insufficient documentation

## 2022-01-21 DIAGNOSIS — R1084 Generalized abdominal pain: Secondary | ICD-10-CM | POA: Diagnosis not present

## 2022-01-21 DIAGNOSIS — Z20822 Contact with and (suspected) exposure to covid-19: Secondary | ICD-10-CM | POA: Diagnosis not present

## 2022-01-21 DIAGNOSIS — R109 Unspecified abdominal pain: Secondary | ICD-10-CM | POA: Diagnosis present

## 2022-01-21 LAB — URINALYSIS, ROUTINE W REFLEX MICROSCOPIC
Bilirubin Urine: NEGATIVE
Glucose, UA: NEGATIVE mg/dL
Hgb urine dipstick: NEGATIVE
Ketones, ur: 15 mg/dL — AB
Nitrite: NEGATIVE
Protein, ur: 30 mg/dL — AB
Specific Gravity, Urine: >1.046 — ABNORMAL HIGH (ref 1.005–1.030)
pH: 6 (ref 5.0–8.0)

## 2022-01-21 LAB — RESP PANEL BY RT-PCR (FLU A&B, COVID) ARPGX2
Influenza A by PCR: NEGATIVE
Influenza B by PCR: NEGATIVE
SARS Coronavirus 2 by RT PCR: NEGATIVE

## 2022-01-21 LAB — CBC
HCT: 38.5 % (ref 36.0–46.0)
Hemoglobin: 12.7 g/dL (ref 12.0–15.0)
MCH: 30.8 pg (ref 26.0–34.0)
MCHC: 33 g/dL (ref 30.0–36.0)
MCV: 93.4 fL (ref 80.0–100.0)
Platelets: 288 10*3/uL (ref 150–400)
RBC: 4.12 MIL/uL (ref 3.87–5.11)
RDW: 12.8 % (ref 11.5–15.5)
WBC: 5.1 10*3/uL (ref 4.0–10.5)
nRBC: 0 % (ref 0.0–0.2)

## 2022-01-21 LAB — COMPREHENSIVE METABOLIC PANEL
ALT: 11 U/L (ref 0–44)
AST: 24 U/L (ref 15–41)
Albumin: 4.5 g/dL (ref 3.5–5.0)
Alkaline Phosphatase: 37 U/L — ABNORMAL LOW (ref 38–126)
Anion gap: 7 (ref 5–15)
BUN: 8 mg/dL (ref 6–20)
CO2: 27 mmol/L (ref 22–32)
Calcium: 9.6 mg/dL (ref 8.9–10.3)
Chloride: 100 mmol/L (ref 98–111)
Creatinine, Ser: 0.93 mg/dL (ref 0.44–1.00)
GFR, Estimated: >60 mL/min (ref >60)
Glucose, Bld: 90 mg/dL (ref 70–99)
Potassium: 4.3 mmol/L (ref 3.5–5.1)
Sodium: 134 mmol/L — ABNORMAL LOW (ref 135–145)
Total Bilirubin: 0.4 mg/dL (ref 0.3–1.2)
Total Protein: 7.4 g/dL (ref 6.5–8.1)

## 2022-01-21 LAB — HCG, SERUM, QUALITATIVE: Preg, Serum: NEGATIVE

## 2022-01-21 LAB — LIPASE, BLOOD: Lipase: 34 U/L (ref 11–51)

## 2022-01-21 MED ORDER — SODIUM CHLORIDE 0.9 % IV BOLUS
1000.0000 mL | Freq: Once | INTRAVENOUS | Status: AC
  Administered 2022-01-21: 1000 mL via INTRAVENOUS

## 2022-01-21 MED ORDER — ONDANSETRON HCL 4 MG/2ML IJ SOLN
4.0000 mg | Freq: Once | INTRAMUSCULAR | Status: AC
  Administered 2022-01-21: 4 mg via INTRAVENOUS
  Filled 2022-01-21: qty 2

## 2022-01-21 MED ORDER — ONDANSETRON HCL 4 MG PO TABS: 4.0000 mg | ORAL_TABLET | Freq: Four times a day (QID) | ORAL | 0 refills | Status: DC

## 2022-01-21 MED ORDER — IOHEXOL 300 MG/ML  SOLN
100.0000 mL | Freq: Once | INTRAMUSCULAR | Status: AC | PRN
  Administered 2022-01-21: 80 mL via INTRAVENOUS

## 2022-01-21 NOTE — ED Triage Notes (Signed)
Patient here POV from Home.  Endorses ABD Pain associated with N/VD and Headaches for approximately 3 Days. Worsening since. Pain is Mid ABD.  Hysterectomy 3 Weeks ago. Sent by UC today for Evaluation.   NAD Noted during Triage. A&Ox4. GCS 15. Ambulatory.

## 2022-01-21 NOTE — ED Triage Notes (Signed)
Pt presents with abdominal zdiscomfort, nausea, vomiting, and diarrhea X 3 days; pt states she had a hysterectomy X 2 weeks ago.

## 2022-01-21 NOTE — ED Notes (Signed)
Patient is being discharged from the Urgent Care and sent to the Emergency Department via personal vehicle . Per Provider Ewell Poe, patient is in need of higher level of care due to abdominal pain. Patient is aware and verbalizes understanding of plan of care.    Vitals:   01/21/22 1657  BP: 129/87  Pulse: 73  Resp: 18  Temp: 98.5 F (36.9 C)  SpO2: 96%

## 2022-01-21 NOTE — Discharge Instructions (Addendum)
Diagnosed with lower abdominal pain. Likely related to ongoing pain in setting of hysterectomy 3 weeks ago. CT scan of your abdomen and pelvis revealed a small amount of free fluid but nothing acute. Recommend ibuprofen/tylenol as needed for pain. And I have ordered zofran for you to take as needed for nausea. Otherwise, strongly recommend follow up with ob/gyn regarding ongoing lower abdominal pain. If new fever, abnormal or blood vaginal discharge, or new back pain, blood in urine or stool please return to the ED for further evaluation.

## 2022-01-21 NOTE — ED Provider Notes (Signed)
[facility]    CSN: [id number] Arrival date & time: 01/21/22  1633      History   Chief Complaint Chief Complaint  Patient presents with   Abdominal Pain   Emesis   Diarrhea    HPI Natalie Thornton is a 35 y.o. female.   Patient here today for evaluation of abdominal discomfort, nausea, vomiting and mild diarrhea that started 3 days ago.  She reports that abdominal pain is diffuse.  She notes that she did have a hysterectomy 2 weeks ago.  She did call her surgeon who reported that he they did not think her abdominal pain was related.  She has not had fever.  She denies any blood in her vomit or stool.  The history is provided by the patient.  Abdominal Pain Associated symptoms: diarrhea, nausea, vaginal bleeding, vaginal discharge and vomiting   Associated symptoms: no chills, no fever and no shortness of breath   Emesis Associated symptoms: abdominal pain and diarrhea   Associated symptoms: no chills and no fever   Diarrhea Associated symptoms: abdominal pain and vomiting   Associated symptoms: no chills and no fever     Past Medical History:  Diagnosis Date   Anxiety    Arthritis    Chronic back pain    Degeneration of intervertebral disc of lumbar region 07/30/2019   Degenerative disc disease, cervical    H pylori ulcer    H/O degenerative disc disease 2013   History of hiatal hernia    Low back pain    Numbness    Whole back    Pilonidal cyst     Patient Active Problem List   Diagnosis Date Noted   H/O vaginal hysterectomy 01/12/2022   History of bilateral salpingectomy 01/12/2022   Vaginitis 01/12/2022   Post-operative state 12/28/2021   Epigastric pain 09/10/2020   Lipoma of back 06/05/2019   Lumbar foraminal stenosis 03/13/2019   Neck pain 01/18/2019   S/P excision of lipoma 09/12/2018   Chronic bilateral thoracic back pain 07/17/2018   DDD (degenerative disc disease), lumbar 06/14/2018   Anxiety and depression 01/06/2017    Cigarette smoker 10/20/2016   Solitary pulmonary nodule 10/20/2016    Past Surgical History:  Procedure Laterality Date   COLONOSCOPY     LIPOMA EXCISION Right 08/10/2018   Procedure: EXCISION LIPOMA, RIGHT;  Surgeon: Fredirick Maudlin, MD;  Location: [facility];  Service: General;  Laterality: Right;   VAGINAL HYSTERECTOMY N/A 12/28/2021   Procedure: HYSTERECTOMY VAGINAL WITH BILATERAL SALPINGECTOMY;  Surgeon: Chancy Milroy, MD;  Location: [facility];  Service: Gynecology;  Laterality: N/A;    OB History     Gravida  0   Para  0   Term  0   Preterm  0   AB  0   Living  0      SAB  0   IAB  0   Ectopic  0   Multiple  0   Live Births  0            Home Medications    Prior to Admission medications   Medication Sig Start Date End Date Taking? Authorizing Provider  ketorolac (TORADOL) 10 MG tablet Take 1.5 tablets (15 mg total) by mouth 3 (three) times daily as needed. Patient not taking: Reported on 01/12/2022 01/04/22   Chancy Milroy, MD  metroNIDAZOLE (FLAGYL) 500 MG tablet Take 1 tablet (500 mg total) by mouth 2 (two) times daily. 01/12/22   Rip Harbour,  Audrie Lia, MD  albuterol (VENTOLIN HFA) 108 (90 Base) MCG/ACT inhaler Inhale 2 puffs into the lungs every 2 (two) hours as needed for wheezing or shortness of breath (cough). Patient not taking: No sig reported 03/08/19 06/15/19  Muthersbaugh, Jarrett Soho, PA-C  omeprazole (PRILOSEC) 20 MG capsule Take 1 capsule (20 mg total) by mouth daily. Patient not taking: No sig reported 10/30/19 12/03/19  Argentina Donovan, PA-C  sucralfate (CARAFATE) 1 GM/10ML suspension Take 10 mLs (1 g total) by mouth 4 (four) times daily -  with meals and at bedtime. Patient not taking: No sig reported 06/09/19 06/15/19  Jaynee Eagles, PA-C    Family History Family History  Problem Relation Age of Onset   Arthritis Paternal Grandfather    Head & neck cancer Paternal Grandmother        Back cancer   Lymphoma Paternal Grandmother    Arthritis Paternal  Grandmother    AAA (abdominal aortic aneurysm) Maternal Grandmother    Leukemia Maternal Grandmother    Breast cancer Maternal Grandmother    Healthy Father    Hypertension Mother    Healthy Mother    Cirrhosis Maternal Uncle    Breast cancer Other    Colon cancer Neg Hx    Esophageal cancer Neg Hx    Rectal cancer Neg Hx    Stomach cancer Neg Hx     Social History Social History   Tobacco Use   Smoking status: Some Days    Packs/day: 1.00    Types: Cigars, Cigarettes    Passive exposure: Current   Smokeless tobacco: Never  Vaping Use   Vaping Use: Never used  Substance Use Topics   Alcohol use: Yes    Comment: occasionally   Drug use: No     Allergies   Patient has no known allergies.   Review of Systems Review of Systems  Constitutional:  Negative for chills and fever.  Eyes:  Negative for discharge and redness.  Respiratory:  Negative for shortness of breath.   Gastrointestinal:  Positive for abdominal pain, diarrhea, nausea and vomiting.  Genitourinary:  Positive for vaginal bleeding and vaginal discharge.     Physical Exam Triage Vital Signs ED Triage Vitals  Enc Vitals Group     BP 01/21/22 1657 129/87     Pulse Rate 01/21/22 1657 73     Resp 01/21/22 1657 18     Temp 01/21/22 1657 98.5 F (36.9 C)     Temp Source 01/21/22 1657 Oral     SpO2 01/21/22 1657 96 %     Weight --      Height --      Head Circumference --      Peak Flow --      Pain Score 01/21/22 1700 5     Pain Loc --      Pain Edu? --      Excl. in [place]? --    No data found.  Updated Vital Signs BP 129/87 (BP Location: Left Arm)   Pulse 73   Temp 98.5 F (36.9 C) (Oral)   Resp 18   LMP 12/15/2021   SpO2 96%   Physical Exam Vitals and nursing note reviewed.  Constitutional:      General: She is not in acute distress.    Appearance: Normal appearance. She is not ill-appearing.  HENT:     Head: Normocephalic and atraumatic.  Eyes:     Conjunctiva/sclera: Conjunctivae  normal.  Cardiovascular:     Rate  and Rhythm: Normal rate.  Pulmonary:     Effort: Pulmonary effort is normal.  Abdominal:     General: Abdomen is flat. There is no distension.     Tenderness: There is abdominal tenderness (diffuse TTP, lower abdomen seemingly more firm to palpation). There is no guarding or rebound.  Neurological:     Mental Status: She is alert.  Psychiatric:        Mood and Affect: Mood normal.        Behavior: Behavior normal.        Thought Content: Thought content normal.      UC Treatments / Results  Labs (all labs ordered are listed, but only abnormal results are displayed) Labs Reviewed - No data to display  EKG   Radiology No results found.  Procedures Procedures (including critical care time)  Medications Ordered in UC Medications - No data to display  Initial Impression / Assessment and Plan / UC Course  I have reviewed the triage vital signs and the nursing notes.  Pertinent labs & imaging results that were available during my care of the patient were reviewed by me and considered in my medical decision making (see chart for details).    Discussed current outbreak of viral gastroenteritis, but given recent surgical history recommended further evaluation in the emergency department for imaging.  She is agreeable to same.   Final Clinical Impressions(s) / UC Diagnoses   Final diagnoses:  Generalized abdominal pain     Discharge Instructions       [facility]  [place], [place] [id number]     [facility]  [place], [place] [address]   ED Prescriptions   None    PDMP not reviewed this encounter.   Francene Finders, PA-C 01/21/22 1807

## 2022-01-21 NOTE — ED Provider Notes (Cosign Needed Addendum)
[facility] EMERGENCY DEPT Provider Note   CSN: [id number] Arrival date & time: 01/21/22  1738     History  Chief Complaint  Patient presents with   Abdominal Pain    Natalie Thornton is a 36 y.o. female with h/o hysterectomy 3 weeks ago presenting for abdominal pain. Pain started 3 days ago. Located in the mid and lower portion of her abdomen. Hurts all day. Pain is sharp but non radiating. Has been vomiting today also. Denies fever. Eating makes it worse. Ibuprofen helps minimally.Was seen by urgent care earlier today who was concerned for post op complication warranting further imaging. For this reason, they recommended she be evaluated in the ED. Currently on antibiotics after being seen by surgery for recent pustulant vaginal discharge. Denies abnormal vaginal discharge. Denies changes in urination or bowel movements.    Abdominal Pain      Home Medications Prior to Admission medications   Medication Sig Start Date End Date Taking? Authorizing Provider  ondansetron (ZOFRAN) 4 MG tablet Take 1 tablet (4 mg total) by mouth every 6 (six) hours. 01/21/22  Yes Harriet Pho, PA-C  ketorolac (TORADOL) 10 MG tablet Take 1.5 tablets (15 mg total) by mouth 3 (three) times daily as needed. Patient not taking: Reported on 01/12/2022 01/04/22   Chancy Milroy, MD  metroNIDAZOLE (FLAGYL) 500 MG tablet Take 1 tablet (500 mg total) by mouth 2 (two) times daily. 01/12/22   Chancy Milroy, MD  albuterol (VENTOLIN HFA) 108 (90 Base) MCG/ACT inhaler Inhale 2 puffs into the lungs every 2 (two) hours as needed for wheezing or shortness of breath (cough). Patient not taking: No sig reported 03/08/19 06/15/19  Muthersbaugh, Jarrett Soho, PA-C  omeprazole (PRILOSEC) 20 MG capsule Take 1 capsule (20 mg total) by mouth daily. Patient not taking: No sig reported 10/30/19 12/03/19  Argentina Donovan, PA-C  sucralfate (CARAFATE) 1 GM/10ML suspension Take 10 mLs (1 g total) by mouth 4 (four) times daily  -  with meals and at bedtime. Patient not taking: No sig reported 06/09/19 06/15/19  Jaynee Eagles, PA-C      Allergies    Patient has no known allergies.    Review of Systems   Review of Systems  Gastrointestinal:  Positive for abdominal pain.    Physical Exam Updated Vital Signs BP 133/85 (BP Location: Right Arm)   Pulse 66   Temp 98.1 F (36.7 C) (Oral)   Resp 16   Ht '4\' 11"'$  (1.499 m)   Wt 56.2 kg   LMP 12/15/2021   SpO2 100%   BMI 25.02 kg/m  Physical Exam Vitals and nursing note reviewed.  HENT:     Head: Normocephalic and atraumatic.     Mouth/Throat:     Mouth: Mucous membranes are moist.  Eyes:     General:        Right eye: No discharge.        Left eye: No discharge.     Conjunctiva/sclera: Conjunctivae normal.  Cardiovascular:     Rate and Rhythm: Normal rate and regular rhythm.     Pulses: Normal pulses.     Heart sounds: Normal heart sounds.  Pulmonary:     Effort: Pulmonary effort is normal.     Breath sounds: Normal breath sounds.  Abdominal:     General: Abdomen is flat.     Palpations: Abdomen is soft.     Tenderness: There is abdominal tenderness in the right lower quadrant, suprapubic area and left  lower quadrant.  Skin:    General: Skin is warm and dry.  Neurological:     General: No focal deficit present.  Psychiatric:        Mood and Affect: Mood normal.     ED Results / Procedures / Treatments   Labs (all labs ordered are listed, but only abnormal results are displayed) Labs Reviewed  COMPREHENSIVE METABOLIC PANEL - Abnormal; Notable for the following components:      Result Value   Sodium 134 (*)    Alkaline Phosphatase 37 (*)    All other components within normal limits  URINALYSIS, ROUTINE W REFLEX MICROSCOPIC - Abnormal; Notable for the following components:   Specific Gravity, Urine >1.046 (*)    Ketones, ur 15 (*)    Protein, ur 30 (*)    Leukocytes,Ua TRACE (*)    All other components within normal limits  RESP PANEL BY  RT-PCR (FLU A&B, COVID) ARPGX2  LIPASE, BLOOD  CBC  HCG, SERUM, QUALITATIVE    EKG None  Radiology CT Abdomen Pelvis W Contrast  Result Date: 01/21/2022 CLINICAL DATA:  Abdominal pain, post-op Hysterectomy 3 weeks ago. Abdominal pain with nausea, vomiting, diarrhea for 3 days, worsening. EXAM: CT ABDOMEN AND PELVIS WITH CONTRAST TECHNIQUE: Multidetector CT imaging of the abdomen and pelvis was performed using the standard protocol following bolus administration of intravenous contrast. RADIATION DOSE REDUCTION: This exam was performed according to the departmental dose-optimization program which includes automated exposure control, adjustment of the mA and/or kV according to patient size and/or use of iterative reconstruction technique. CONTRAST:  40m OMNIPAQUE IOHEXOL 300 MG/ML  SOLN COMPARISON:  Preoperative CT 12/01/2021 FINDINGS: Lower chest: No acute airspace disease or pleural effusion. Hepatobiliary: Stable 9 mm hypodensity in the right hepatic lobe and 18 mm hypodensity in the left lobe, likely cysts or hemangiomas. Additional tiny hepatic hypodensities are too small to characterize. No suspicious liver lesion. Gallbladder physiologically distended, no calcified stone. No biliary dilatation. Pancreas: No ductal dilatation or inflammation. Spleen: Normal in size without focal abnormality. Adrenals/Urinary Tract: Normal adrenal glands. No hydronephrosis or perinephric edema. Homogeneous renal enhancement with symmetric excretion on delayed phase imaging. No renal calculi, no focal renal lesion. Normal ureteral opacification on delayed phase imaging. Urinary bladder is physiologically distended without wall thickening. Stomach/Bowel: Detailed bowel assessment is limited in the absence of enteric contrast. Stomach is partially distended, no evidence of gastric inflammation. Occasional fluid-filled loops of small bowel, no definite wall thickening or inflammatory change. Portions of normal appendix  are tentatively visualized. Small volume of formed stool in the colon. Vascular/Lymphatic: Normal caliber abdominal aorta. Patent portal and splenic veins. Limited assessment for adenopathy in the absence of enteric contrast, no bulky enlarged lymph nodes. Reproductive: Hysterectomy. There is no pelvic fluid collection or evidence of abscess. Small amount of nonspecific non organized free fluid in the pelvis. The ovaries are not well-defined on the current exam. Other: Small amount of pelvic free fluid. No organized fluid collection or free air. No subcutaneous soft tissue abnormalities. Musculoskeletal: Mild L5-S1 degenerative disc disease. There are no acute or suspicious osseous abnormalities. IMPRESSION: 1. Post recent hysterectomy without evidence of complication. Small amount of nonspecific non organized free fluid in the pelvis, may be reactive. 2. Occasional fluid-filled loops of small bowel, nonspecific. No abnormal distension, obstruction or inflammation. Electronically Signed   By: MKeith RakeM.D.   On: 01/21/2022 22:24    Procedures Procedures    Medications Ordered in ED Medications  sodium chloride 0.9 %  bolus 1,000 mL (1,000 mLs Intravenous New Bag/Given 01/21/22 2141)  ondansetron (ZOFRAN) injection 4 mg (4 mg Intravenous Given 01/21/22 2138)  iohexol (OMNIPAQUE) 300 MG/ML solution 100 mL (80 mLs Intravenous Contrast Given 01/21/22 2200)    ED Course/ Medical Decision Making/ A&P                           Medical Decision Making Amount and/or Complexity of Data Reviewed Labs: ordered. Radiology: ordered.  Risk Prescription drug management.   This patient presents to the ED for concern of abdominal pain, this involves a number of treatment options, and is a complaint that carries with it a high risk of complications and morbidity.  The differential diagnosis includes post op infection or abscess, appendicitis, UTI, and ovarian torsion.    Co morbidities: Discussed in  HPI   EMR reviewed including pt PMHx, past surgical history and past visits to ER.   See HPI for more details   Lab Tests:   I ordered and independently interpreted labs. Labs notable for ketonuria   Imaging Studies:  Abnormal findings. I personally reviewed all imaging studies. Imaging notable for  Small amount of nonspecific non organized free fluid in the pelvis        Cardiac Monitoring:  The patient was maintained on a cardiac monitor.  I personally viewed and interpreted the cardiac monitored which showed an underlying rhythm of: NSR NA   Medicines ordered:  I ordered medication including zofran for nausea and 1L volume bolus for fluid resuscitation  Reevaluation of the patient after these medicines showed that the patient improved I have reviewed the patients home medicines and have made adjustments as needed   Critical Interventions:  NA   Consults/Attending Physician   I discussed this case with my attending physician who cosigned this note including patient's presenting symptoms, physical exam, and planned diagnostics and interventions. Attending physician stated agreement with plan or made changes to plan which were implemented.   Reevaluation:  After the interventions noted above I re-evaluated patient and found that they have :improved.      Problem List / ED Course:  Patient presented with lower abdominal pain in setting of recent hysterectomy. This was concerning for post op complication: abscess, infection, or free fluid. CT of the abdomen and pelvis did reveal a small amount of free fluid but otherwise unremarkable. Gave zofran for nausea/vomiting and NS bolus for volume resuscitation. Upon reevaluation, patient felt better. Discussed return precautions and recommended follow up with ob/gyn. Also advised her to continue her antibiotic treatment.     Dispostion:  After consideration of the diagnostic results and the patients response to  treatment, I feel that the patent would benefit from discharge and follow up with ob/gyn regarding post operative lower abdominal pain.          Final Clinical Impression(s) / ED Diagnoses Final diagnoses:  Lower abdominal pain    Rx / DC Orders ED Discharge Orders          Ordered    ondansetron (ZOFRAN) 4 MG tablet  Every 6 hours        01/21/22 2249              Harriet Pho, PA-C 01/21/22 2249    Harriet Pho, PA-C 01/21/22 2251    Tegeler, Gwenyth Allegra, MD 01/22/22 0009

## 2022-01-21 NOTE — Discharge Instructions (Signed)
  [facility]  [place], [place] [address]     [facility]  [place], [place] [address]

## 2022-02-02 ENCOUNTER — Encounter: Payer: Self-pay | Admitting: Obstetrics and Gynecology

## 2022-02-02 ENCOUNTER — Ambulatory Visit (INDEPENDENT_AMBULATORY_CARE_PROVIDER_SITE_OTHER): Payer: Commercial Managed Care - HMO | Admitting: Obstetrics and Gynecology

## 2022-02-02 VITALS — BP 126/80 | HR 99 | Wt 123.0 lb

## 2022-02-02 DIAGNOSIS — Z9889 Other specified postprocedural states: Secondary | ICD-10-CM

## 2022-02-02 NOTE — Patient Instructions (Signed)

## 2022-02-02 NOTE — Progress Notes (Signed)
Natalie Thornton presents for post op appt. S/P TVH and BS Reports some problems with constipation. Vaginal bleeding and discharge have resolved. Has been seen in ER with c/o abd pain. CT scan normal. Taking Miralax qd  PE AF VSS Chaperone present Lungs clear Heart RRR Abd soft, + BS GU nl EGBUS, cuff well healed, no masses or tenderness  A/P Post op  Doing well. Add fiber to daily bowel regiment. If continues with problems with constipation, to see PCP. O/W F/U PRN

## 2022-02-02 NOTE — Progress Notes (Signed)
Pt states she is still having some pelvic cramping.  Pt states she bowel issues - OTC meds haven't helped.  Pt complains of increase in HA's.

## 2022-03-22 ENCOUNTER — Other Ambulatory Visit: Payer: Self-pay

## 2022-03-22 ENCOUNTER — Emergency Department (HOSPITAL_COMMUNITY): Payer: Commercial Managed Care - HMO

## 2022-03-22 ENCOUNTER — Emergency Department (HOSPITAL_COMMUNITY)
Admission: EM | Admit: 2022-03-22 | Discharge: 2022-03-22 | Disposition: A | Discharge status: Home / Self Care | Payer: Commercial Managed Care - HMO | Attending: Emergency Medicine | Admitting: Emergency Medicine

## 2022-03-22 ENCOUNTER — Encounter (HOSPITAL_COMMUNITY): Payer: Self-pay

## 2022-03-22 DIAGNOSIS — M546 Pain in thoracic spine: Secondary | ICD-10-CM | POA: Insufficient documentation

## 2022-03-22 DIAGNOSIS — G8929 Other chronic pain: Secondary | ICD-10-CM | POA: Diagnosis not present

## 2022-03-22 DIAGNOSIS — M545 Low back pain, unspecified: Secondary | ICD-10-CM | POA: Diagnosis present

## 2022-03-22 LAB — URINALYSIS, ROUTINE W REFLEX MICROSCOPIC
Bilirubin Urine: NEGATIVE
Glucose, UA: NEGATIVE mg/dL
Hgb urine dipstick: NEGATIVE
Ketones, ur: NEGATIVE mg/dL
Leukocytes,Ua: NEGATIVE
Nitrite: NEGATIVE
Protein, ur: NEGATIVE mg/dL
Specific Gravity, Urine: 1.018 (ref 1.005–1.030)
pH: 6 (ref 5.0–8.0)

## 2022-03-22 MED ORDER — HYDROCODONE-ACETAMINOPHEN 5-325 MG PO TABS
1.0000 | ORAL_TABLET | Freq: Once | ORAL | Status: AC
  Administered 2022-03-22: 1 via ORAL
  Filled 2022-03-22: qty 1

## 2022-03-22 MED ORDER — PREDNISONE 20 MG PO TABS: 40.0000 mg | ORAL_TABLET | Freq: Every day | ORAL | 0 refills | Status: DC

## 2022-03-22 MED ORDER — HYDROCODONE-ACETAMINOPHEN 5-325 MG PO TABS: 2.0000 | ORAL_TABLET | ORAL | 0 refills | Status: DC | PRN

## 2022-03-22 MED ORDER — PREDNISONE 20 MG PO TABS: 40.0000 mg | ORAL_TABLET | Freq: Every day | ORAL | 0 refills | Status: AC

## 2022-03-22 MED ORDER — HYDROCODONE-ACETAMINOPHEN 5-325 MG PO TABS: 2.0000 | ORAL_TABLET | ORAL | 0 refills | Status: AC | PRN

## 2022-03-22 NOTE — ED Provider Notes (Signed)
[facility] EMERGENCY DEPARTMENT Provider Note   CSN: [id number] Arrival date & time: 03/22/22  1522     History  Chief Complaint  Patient presents with   Back Pain    Natalie Thornton is a 36 y.o. female.  36 year old female with a past medical history of degenerative disc disease presents to the ED with a chief complaint of back pain which has been ongoing for the past 2 weeks.  Reports she suffered back pain her entire life, has been evaluated by orthopedics and diagnosed with generative disc disease, she see prior steroid injections, received physical therapy for this without any improvement in her symptoms.  She described as a poking sensation from the mid thoracic back onto the lumbar spine worse on the right side.  There is worsening symptoms with ambulation but alleviated symptoms with rest.  She has tried ibuprofen without any improvement in her symptoms.  Symptoms are worsened with any type of ambulation.  She denies any fever, prior history of IV drug use, urinary retention, or bowel incontinence. No prior history of cancer, no trauma.   The history is provided by the patient.  Back Pain Location:  Thoracic spine and lumbar spine Quality:  Aching Radiates to:  Does not radiate Pain severity:  Moderate Pain is:  Same all the time Onset quality:  Gradual Duration:  2 weeks Timing:  Intermittent Progression:  Waxing and waning Chronicity:  Chronic Context: not falling and not MVA   Associated symptoms: no abdominal pain, no chest pain and no fever        Home Medications Prior to Admission medications   Medication Sig Start Date End Date Taking? Authorizing Provider  HYDROcodone-acetaminophen (NORCO/VICODIN) 5-325 MG tablet Take 2 tablets by mouth every 4 (four) hours as needed for up to 3 days. 03/22/22 03/25/22 Yes Alice Vitelli, PA-C  predniSONE (DELTASONE) 20 MG tablet Take 2 tablets (40 mg total) by mouth daily for 5 days. 03/22/22 03/27/22 Yes  Aranza Geddes, PA-C  ondansetron (ZOFRAN) 4 MG tablet Take 1 tablet (4 mg total) by mouth every 6 (six) hours. 01/21/22   Harriet Pho, PA-C  albuterol (VENTOLIN HFA) 108 (90 Base) MCG/ACT inhaler Inhale 2 puffs into the lungs every 2 (two) hours as needed for wheezing or shortness of breath (cough). Patient not taking: No sig reported 03/08/19 06/15/19  Muthersbaugh, Jarrett Soho, PA-C  omeprazole (PRILOSEC) 20 MG capsule Take 1 capsule (20 mg total) by mouth daily. Patient not taking: No sig reported 10/30/19 12/03/19  Argentina Donovan, PA-C  sucralfate (CARAFATE) 1 GM/10ML suspension Take 10 mLs (1 g total) by mouth 4 (four) times daily -  with meals and at bedtime. Patient not taking: No sig reported 06/09/19 06/15/19  Jaynee Eagles, PA-C      Allergies    Patient has no known allergies.    Review of Systems   Review of Systems  Constitutional:  Negative for fever.  Respiratory:  Negative for shortness of breath.   Cardiovascular:  Negative for chest pain.  Gastrointestinal:  Negative for abdominal pain.  Musculoskeletal:  Positive for back pain.    Physical Exam Updated Vital Signs BP 112/80 (BP Location: Right Arm)   Pulse 70   Temp 98.5 F (36.9 C) (Oral)   Resp 14   Ht '4\' 11"'$  (1.499 m)   Wt 54.4 kg   LMP 12/15/2021   SpO2 99%   BMI 24.24 kg/m  Physical Exam Vitals and nursing note reviewed.  Constitutional:  Appearance: Normal appearance.  HENT:     Head: Normocephalic and atraumatic.     Mouth/Throat:     Mouth: Mucous membranes are moist.  Eyes:     Pupils: Pupils are equal, round, and reactive to light.  Cardiovascular:     Rate and Rhythm: Normal rate.  Pulmonary:     Effort: Pulmonary effort is normal.  Abdominal:     General: Abdomen is flat.  Musculoskeletal:     Cervical back: Normal range of motion and neck supple.     Thoracic back: Tenderness present. No bony tenderness. Normal range of motion. No scoliosis.     Lumbar back: Tenderness and bony  tenderness present.     Comments: RLE- KF,KE 5/5 strength LLE- HF, HE 5/5 strength Normal gait. No pronator drift. No leg drop.  CN I, II and VIII not tested. CN II-XII grossly intact bilaterally.      Skin:    General: Skin is warm and dry.  Neurological:     Mental Status: She is alert and oriented to person, place, and time.     ED Results / Procedures / Treatments   Labs (all labs ordered are listed, but only abnormal results are displayed) Labs Reviewed  URINALYSIS, ROUTINE W REFLEX MICROSCOPIC  PREGNANCY, URINE    EKG None  Radiology DG Thoracic Spine 2 View  Result Date: 03/22/2022 CLINICAL DATA:  Low back pain EXAM: THORACIC SPINE 2 VIEWS COMPARISON:  None Available. FINDINGS: There is no evidence of thoracic spine fracture. Alignment is normal. No other significant bone abnormalities are identified. IMPRESSION: Negative. Electronically Signed   By: Rolm Baptise M.D.   On: 03/22/2022 19:44   DG Lumbar Spine Complete  Result Date: 03/22/2022 CLINICAL DATA:  Severe low back pain EXAM: LUMBAR SPINE - COMPLETE 4+ VIEW COMPARISON:  None Available. FINDINGS: Degenerative disc disease at L5-S1 with disc space narrowing and spurring. Remainder the disc spaces are maintained. Normal alignment. No fracture. SI joints symmetric and unremarkable. IMPRESSION: No acute bony abnormality. Degenerative disc disease at L5-S1. Electronically Signed   By: Rolm Baptise M.D.   On: 03/22/2022 19:43    Procedures Procedures    Medications Ordered in ED Medications  HYDROcodone-acetaminophen (NORCO/VICODIN) 5-325 MG per tablet 1 tablet (1 tablet Oral Given 03/22/22 1901)    ED Course/ Medical Decision Making/ A&P                           Medical Decision Making Amount and/or Complexity of Data Reviewed Labs: ordered. Radiology: ordered.  Risk Prescription drug management.    Patient here with prior history of degenerative disc disease with worsening back pain, feeling a  poking sensation to the thoracic back along with lumbar spine.  Has tried ibuprofen without any improvement in symptoms.  Was previously scheduled for intervention however insurance canceled this procedure she does not have anyone that she follows up for this.  No red flags.  Neuro evaluation is normal, patient is ambulatory with a steady gait in the ED.  Good strength bilaterally.  There is some pain along the midline of her lumbar spine however reports that her pain is more so along the right side.  Extensive chart review does show patient's prior back pain is more prominent to the right side as well with some sciatica present.  No numbness. NO bowel or bladder present.    Xray of her lumbar spine did not show any acute findings aside  from ongoing degenerative disc disease, discussed these results with patient.  We will trial her on a short course of steroids, she denies any prior history of diabetes.  We will also provide her with Norco after PDMP review last prescription in the month of July 2023.  Patient is hemodynamically stable for discharge.    Portions of this note were generated with [employer]. Dictation errors may occur despite best attempts at proofreading.   Final Clinical Impression(s) / ED Diagnoses Final diagnoses:  Chronic right-sided low back pain without sciatica    Rx / DC Orders ED Discharge Orders          Ordered    predniSONE (DELTASONE) 20 MG tablet  Daily        03/22/22 2020    HYDROcodone-acetaminophen (NORCO/VICODIN) 5-325 MG tablet  Every 4 hours PRN        03/22/22 2020              Janeece Fitting, PA-C 03/22/22 2021    Gareth Morgan, MD 03/23/22 1113

## 2022-03-22 NOTE — ED Provider Triage Note (Signed)
Emergency Medicine Provider Triage Evaluation Note  Natalie Thornton , a 36 y.o. female  was evaluated in triage.  Pt complains of right sided middle back pain x 2 weeks. No known injury. Pain changed to a "poking sensation" today which caused her to come in today.Pain worse with movement.  Hx of degenerative disc disease and chronic back pain  Review of Systems  Positive: Back pain Negative: Urinary sx, numbness, weakness, incontinence  Physical Exam  LMP 12/15/2021  Gen:   Awake, no distress   Resp:  Normal effort  MSK:   Moves extremities without difficulty  Other:    Medical Decision Making  Medically screening exam initiated at 4:54 PM.  Appropriate orders placed.  Natalie Thornton was informed that the remainder of the evaluation will be completed by another provider, this initial triage assessment does not replace that evaluation, and the importance of remaining in the ED until their evaluation is complete.     Kateri Plummer, PA-C 03/22/22 1654

## 2022-03-22 NOTE — ED Notes (Signed)
Pt received to hall 12 via ambulation. Pt c/o lower back pain and hx of degenerative disks. PT denies changed in bladder of bowel or recent injury.

## 2022-03-22 NOTE — Discharge Instructions (Addendum)
Your x-ray on today's visit did not show any acute findings.  I prescribed a short course of steroids to help treat your pain.  Please take 2 tablets daily for the next 5 days.  Please be aware this medication can make you have appetite changes, cause flushness, insomnia.  I have also prescribed a short course of pain medication, please take this only for severe pain.  You will need to schedule an appointment with your primary care physician in order to obtain further evaluation of your ongoing back pain.

## 2022-03-22 NOTE — ED Triage Notes (Signed)
Pt states that for two weeks she has been having R sided thoracic back pain. Denies any known injury/trauma. Pt denies any bowel/bladder incontinence. No saddle anesthesia. Pt has hx of DDD.

## 2022-03-22 NOTE — ED Notes (Signed)
Patient transported to X-ray 

## 2022-03-22 NOTE — ED Notes (Signed)
Patient called for room x3 

## 2022-03-22 NOTE — ED Notes (Signed)
Patient called for registration x3

## 2022-03-31 ENCOUNTER — Telehealth: Payer: Self-pay

## 2022-03-31 ENCOUNTER — Other Ambulatory Visit: Payer: Self-pay | Admitting: Orthopedic Surgery

## 2022-03-31 DIAGNOSIS — M5451 Vertebrogenic low back pain: Secondary | ICD-10-CM

## 2022-03-31 NOTE — Progress Notes (Signed)
Phone call to patient to discuss order for intradiscal injection. Reviewed pts allergies and most recent weight. Pt was advised not to take oral antibiotics for this procedure that we would give her IV antibiotics prior. Discharge instructions also reviewed with patient and instructed patient to have a driver the day of the procedure. Pt verbalized understanding.

## 2022-04-11 ENCOUNTER — Emergency Department (HOSPITAL_COMMUNITY): Payer: Commercial Managed Care - HMO

## 2022-04-11 ENCOUNTER — Other Ambulatory Visit: Payer: Self-pay

## 2022-04-11 ENCOUNTER — Emergency Department (HOSPITAL_COMMUNITY)
Admission: EM | Admit: 2022-04-11 | Discharge: 2022-04-12 | Disposition: A | Discharge status: Home / Self Care | Payer: Commercial Managed Care - HMO | Attending: Emergency Medicine | Admitting: Emergency Medicine

## 2022-04-11 ENCOUNTER — Encounter (HOSPITAL_COMMUNITY): Payer: Self-pay | Admitting: Emergency Medicine

## 2022-04-11 DIAGNOSIS — M545 Low back pain, unspecified: Secondary | ICD-10-CM | POA: Insufficient documentation

## 2022-04-11 DIAGNOSIS — F17210 Nicotine dependence, cigarettes, uncomplicated: Secondary | ICD-10-CM | POA: Diagnosis not present

## 2022-04-11 DIAGNOSIS — G8929 Other chronic pain: Secondary | ICD-10-CM | POA: Diagnosis not present

## 2022-04-11 MED ORDER — LIDOCAINE 5 % EX PTCH: 1.0000 | MEDICATED_PATCH | CUTANEOUS | 0 refills | Status: DC

## 2022-04-11 MED ORDER — METHOCARBAMOL 500 MG PO TABS
1000.0000 mg | ORAL_TABLET | Freq: Once | ORAL | Status: AC
  Administered 2022-04-11: 1000 mg via ORAL
  Filled 2022-04-11: qty 2

## 2022-04-11 MED ORDER — KETOROLAC TROMETHAMINE 15 MG/ML IJ SOLN
15.0000 mg | Freq: Once | INTRAMUSCULAR | Status: AC
  Administered 2022-04-11: 15 mg via INTRAVENOUS
  Filled 2022-04-11: qty 1

## 2022-04-11 MED ORDER — METHOCARBAMOL 500 MG PO TABS: 500.0000 mg | ORAL_TABLET | Freq: Three times a day (TID) | ORAL | 0 refills | Status: DC | PRN

## 2022-04-11 NOTE — ED Provider Notes (Signed)
[facility] Emergency Department Provider Note MRN:  [id number]  Arrival date & time: 04/11/22     Chief Complaint   Back Pain   History of Present Illness   Natalie Thornton is a 36 y.o. year-old female with a history of chronic back pain presenting to the ED with chief complaint of back pain.  Back pain hurting more than normal over the past few days.  Located in the lower back, nonradiating.  No numbness or weakness to the arms or legs, no bowel or bladder dysfunction, no flank pain, no hematuria or dysuria.  No fever.  Review of Systems  A thorough review of systems was obtained and all systems are negative except as noted in the HPI and PMH.   Patient's Health History    Past Medical History:  Diagnosis Date   Anxiety    Arthritis    Chronic back pain    Degeneration of intervertebral disc of lumbar region 07/30/2019   Degenerative disc disease, cervical    H pylori ulcer    H/O degenerative disc disease 2013   History of hiatal hernia    Low back pain    Numbness    Whole back    Pilonidal cyst     Past Surgical History:  Procedure Laterality Date   COLONOSCOPY     LIPOMA EXCISION Right 08/10/2018   Procedure: EXCISION LIPOMA, RIGHT;  Surgeon: Fredirick Maudlin, MD;  Location: [facility];  Service: General;  Laterality: Right;   VAGINAL HYSTERECTOMY N/A 12/28/2021   Procedure: HYSTERECTOMY VAGINAL WITH BILATERAL SALPINGECTOMY;  Surgeon: Chancy Milroy, MD;  Location: [facility];  Service: Gynecology;  Laterality: N/A;    Family History  Problem Relation Age of Onset   Arthritis Paternal Grandfather    Head & neck cancer Paternal Grandmother        Back cancer   Lymphoma Paternal Grandmother    Arthritis Paternal Grandmother    AAA (abdominal aortic aneurysm) Maternal Grandmother    Leukemia Maternal Grandmother    Breast cancer Maternal Grandmother    Healthy Father    Hypertension Mother    Healthy Mother    Cirrhosis Maternal Uncle     Breast cancer Other    Colon cancer Neg Hx    Esophageal cancer Neg Hx    Rectal cancer Neg Hx    Stomach cancer Neg Hx     Social History   Socioeconomic History   Marital status: Single    Spouse name: Not on file   Number of children: 2   Years of education: Not on file   Highest education level: Not on file  Occupational History   Occupation: [employer]   Tobacco Use   Smoking status: Some Days    Packs/day: 1.00    Types: Cigars, Cigarettes    Passive exposure: Current   Smokeless tobacco: Never  Vaping Use   Vaping Use: Never used  Substance and Sexual Activity   Alcohol use: Yes    Comment: occasionally   Drug use: No   Sexual activity: Not Currently    Birth control/protection: None    Comment: oral intercourse only  Other Topics Concern   Not on file  Social History Narrative   Not on file   Social Determinants of Health   Financial Resource Strain: Not on file  Food Insecurity: Not on file  Transportation Needs: Not on file  Physical Activity: Not on file  Stress: Not on file  Social Connections:  Not on file  Intimate Partner Violence: Not on file     Physical Exam   Vitals:   04/11/22 1920 04/11/22 1923  BP: 112/77   Pulse: 97   Resp: 18   Temp:  99.1 F (37.3 C)  SpO2: 100%     CONSTITUTIONAL: Well-appearing, NAD NEURO/PSYCH:  Alert and oriented x 3, normal and symmetric strength and sensation, normal coordination, normal speech, normal ambulation EYES:  eyes equal and reactive ENT/NECK:  no LAD, no JVD CARDIO: Regular rate, well-perfused, normal S1 and S2 PULM:  CTAB no wheezing or rhonchi GI/GU:  non-distended, non-tender MSK/SPINE:  No gross deformities, no edema SKIN:  no rash, atraumatic   *Additional and/or pertinent findings included in MDM below  Diagnostic and Interventional Summary    EKG Interpretation  Date/Time:    Ventricular Rate:    PR Interval:    QRS Duration:   QT Interval:    QTC Calculation:   R  Axis:     Text Interpretation:         Labs Reviewed - No data to display  No orders to display    Medications  ketorolac (TORADOL) 15 MG/ML injection 15 mg (has no administration in time range)  methocarbamol (ROBAXIN) tablet 1,000 mg (has no administration in time range)     Procedures  /  Critical Care Procedures  ED Course and Medical Decision Making  Initial Impression and Ddx Acute on chronic back pain, known degenerative disc disease.  She is ambulatory, reassuring vital signs, no signs or symptoms of myelopathy.  Having trouble getting into a spinal specialist.  No indication for imaging or further testing or admission, will treat her pain and provide a pain regimen at home.  Past medical/surgical history that increases complexity of ED encounter: Degenerative disc disease  Interpretation of Diagnostics Laboratory and/or imaging options to aid in the diagnosis/care of the patient were considered.  After careful history and physical examination, it was determined that there was no indication for diagnostics at this time.  Patient Reassessment and Ultimate Disposition/Management     Discharge  Patient management required discussion with the following services or consulting groups:  None  Complexity of Problems Addressed Acute complicated illness or Injury  Additional Data Reviewed and Analyzed Further history obtained from: None  Additional Factors Impacting ED Encounter Risk Prescriptions  Barth Kirks. [place]onia Small, [place] [email or web address]  Final Clinical Impressions(s) / ED Diagnoses     ICD-10-CM   1. Chronic midline low back pain without sciatica  M54.50    G89.29       ED Discharge Orders          Ordered    lidocaine (LIDODERM) 5 %  Every 24 hours        04/11/22 2336    methocarbamol (ROBAXIN) 500 MG tablet  Every 8 hours PRN        04/11/22 2336             Discharge Instructions  Discussed with and Provided to Patient:    Discharge Instructions      You were evaluated in the Emergency Department and after careful evaluation, we did not find any emergent condition requiring admission or further testing in the hospital.  Your exam/testing today was overall reassuring.  Recommend Tylenol or Motrin for discomfort.  Also recommend a lidocaine patches daily.  Can use the Robaxin for more significant pain.  Recommend follow-up with a primary care doctor  or back specialist.  Please return to the Emergency Department if you experience any worsening of your condition.  Thank you for allowing [place] to be a part of your care.       Maudie Flakes, MD 04/11/22 2340

## 2022-04-11 NOTE — ED Provider Triage Note (Cosign Needed Addendum)
Emergency Medicine Provider Triage Evaluation Note  Natalie Thornton , a 36 y.o. female  was evaluated in triage.  Pt complains of lower back pain. Denies recent injury, trauma, fall, heavy lifting. Has been evaluated by orthopedics for her symptoms. Denies bowel/bladder incontinence, nausea, or vomiting.   Review of Systems  Positive:  Negative:   Physical Exam  Temp 99.1 F (37.3 C) (Oral)   Wt 53.5 kg   LMP 12/15/2021   BMI 23.82 kg/m  Gen:   Awake, no distress   Resp:  Normal effort  MSK:   Moves extremities without difficulty  Other:    Medical Decision Making  Medically screening exam initiated at 7:24 PM.  Appropriate orders placed.  AIVY AKTER was informed that the remainder of the evaluation will be completed by another provider, this initial triage assessment does not replace that evaluation, and the importance of remaining in the ED until their evaluation is complete.  Work-up initiated.    Hilma Steinhilber A, PA-C 04/11/22 1929   7:40 PM-patient declines lumbar x-ray at this time.  Patient notes that she has already been evaluated by an orthopedist following her most recent lumbar x-ray and notes that she just wants help with her pain.  She also notes that she was unable to see the orthopedist due to the co-pay.   Safiyya Stokes A, PA-C 04/11/22 1940

## 2022-04-11 NOTE — ED Triage Notes (Signed)
Pt in with midline low back pain, hx of DDD. States she sees a spine specialist, but cannot afford to visit him at this time. Pain worse today, denies any b/b incontinence or injury

## 2022-04-11 NOTE — Discharge Instructions (Signed)
You were evaluated in the Emergency Department and after careful evaluation, we did not find any emergent condition requiring admission or further testing in the hospital.  Your exam/testing today was overall reassuring.  Recommend Tylenol or Motrin for discomfort.  Also recommend a lidocaine patches daily.  Can use the Robaxin for more significant pain.  Recommend follow-up with a primary care doctor or back specialist.  Please return to the Emergency Department if you experience any worsening of your condition.  Thank you for allowing [place] to be a part of your care.

## 2022-04-12 MED ORDER — HYDROCODONE-ACETAMINOPHEN 5-325 MG PO TABS: 1.0000 | ORAL_TABLET | ORAL | 0 refills | Status: DC | PRN

## 2022-04-12 MED ORDER — OXYCODONE-ACETAMINOPHEN 5-325 MG PO TABS
1.0000 | ORAL_TABLET | Freq: Once | ORAL | Status: AC
  Administered 2022-04-12: 1 via ORAL
  Filled 2022-04-12: qty 1

## 2022-04-12 NOTE — Telephone Encounter (Signed)
PT called asking about surgery [facility] to be scheduled.  Informed PT her info has been sent to surgery scheduling and they will be in contact with her about her surgery.  Incoming call

## 2022-05-29 ENCOUNTER — Emergency Department (HOSPITAL_COMMUNITY): Payer: Commercial Managed Care - HMO

## 2022-05-29 ENCOUNTER — Encounter (HOSPITAL_COMMUNITY): Payer: Self-pay

## 2022-05-29 ENCOUNTER — Emergency Department (HOSPITAL_COMMUNITY)
Admission: EM | Admit: 2022-05-29 | Discharge: 2022-05-29 | Disposition: A | Discharge status: Home / Self Care | Payer: Commercial Managed Care - HMO | Attending: Emergency Medicine | Admitting: Emergency Medicine

## 2022-05-29 ENCOUNTER — Other Ambulatory Visit: Payer: Self-pay

## 2022-05-29 DIAGNOSIS — S46012A Strain of muscle(s) and tendon(s) of the rotator cuff of left shoulder, initial encounter: Secondary | ICD-10-CM | POA: Diagnosis not present

## 2022-05-29 DIAGNOSIS — W010XXA Fall on same level from slipping, tripping and stumbling without subsequent striking against object, initial encounter: Secondary | ICD-10-CM | POA: Insufficient documentation

## 2022-05-29 DIAGNOSIS — Y998 Other external cause status: Secondary | ICD-10-CM | POA: Diagnosis not present

## 2022-05-29 DIAGNOSIS — Y92002 Bathroom of unspecified non-institutional (private) residence single-family (private) house as the place of occurrence of the external cause: Secondary | ICD-10-CM | POA: Diagnosis not present

## 2022-05-29 DIAGNOSIS — M5441 Lumbago with sciatica, right side: Secondary | ICD-10-CM

## 2022-05-29 DIAGNOSIS — S3992XA Unspecified injury of lower back, initial encounter: Secondary | ICD-10-CM | POA: Diagnosis present

## 2022-05-29 DIAGNOSIS — S60947A Unspecified superficial injury of left little finger, initial encounter: Secondary | ICD-10-CM | POA: Diagnosis not present

## 2022-05-29 DIAGNOSIS — Y9389 Activity, other specified: Secondary | ICD-10-CM | POA: Insufficient documentation

## 2022-05-29 DIAGNOSIS — S46912A Strain of unspecified muscle, fascia and tendon at shoulder and upper arm level, left arm, initial encounter: Secondary | ICD-10-CM

## 2022-05-29 MED ORDER — LIDOCAINE 5 % EX PTCH
1.0000 | MEDICATED_PATCH | CUTANEOUS | 0 refills | Status: DC
Start: 2022-05-29 — End: 2022-09-24

## 2022-05-29 MED ORDER — HYDROCODONE-ACETAMINOPHEN 5-325 MG PO TABS
1.0000 | ORAL_TABLET | Freq: Once | ORAL | Status: AC
  Administered 2022-05-29: 1 via ORAL
  Filled 2022-05-29: qty 1

## 2022-05-29 MED ORDER — KETOROLAC TROMETHAMINE 15 MG/ML IJ SOLN
15.0000 mg | Freq: Once | INTRAMUSCULAR | Status: AC
  Administered 2022-05-29: 15 mg via INTRAMUSCULAR
  Filled 2022-05-29: qty 1

## 2022-05-29 MED ORDER — PREDNISONE 10 MG (21) PO TBPK: ORAL_TABLET | Freq: Every day | ORAL | 0 refills | Status: DC

## 2022-05-29 NOTE — ED Triage Notes (Signed)
Pt came in POV d/t last night tripped over dog & fell to floor. She has Hx of degenerative back disease & is concerned with her lower back & Lt shoulder pain since her fall. Reports pain 10/10, A/Ox4.

## 2022-05-29 NOTE — ED Provider Notes (Signed)
[facility] EMERGENCY DEPARTMENT Provider Note   CSN: [id number] Arrival date & time: 05/29/22  1404     History  Chief Complaint  Patient presents with   Back Pain   Fall    Natalie Thornton is a 36 y.o. female.  36 year old female presents today for evaluation of low back pain, left shoulder pain, left little finger pain following a fall that occurred last night.  She states she woke up in the middle of night to go to the bathroom.  As she stepped off of her bed she tripped over her dog causing her to fall.  She has taken ibuprofen along 10 AM this morning, but no other medications.  Denies improvement following taking the ibuprofen.  She endorses history of degenerative disc disease.  Reports radiating pain down right lower extremity.  Denies difficulty with bowel or bladder control.  Denies saddle anesthesia.  The history is provided by the patient. No language interpreter was used.       Home Medications Prior to Admission medications   Medication Sig Start Date End Date Taking? Authorizing Provider  HYDROcodone-acetaminophen (NORCO/VICODIN) 5-325 MG tablet Take 1 tablet by mouth every 4 (four) hours as needed. 04/12/22   Maudie Flakes, MD  ibuprofen (ADVIL) 200 MG tablet Take 200 mg by mouth every 6 (six) hours as needed for mild pain.    [provider]  lidocaine (LIDODERM) 5 % Place 1 patch onto the skin daily. Remove & Discard patch within 12 hours or as directed by MD 04/11/22   Maudie Flakes, MD  methocarbamol (ROBAXIN) 500 MG tablet Take 1 tablet (500 mg total) by mouth every 8 (eight) hours as needed for muscle spasms. 04/11/22   Maudie Flakes, MD  ondansetron (ZOFRAN) 4 MG tablet Take 1 tablet (4 mg total) by mouth every 6 (six) hours. Patient not taking: Reported on 03/22/2022 01/21/22   Harriet Pho, PA-C  albuterol (VENTOLIN HFA) 108 (90 Base) MCG/ACT inhaler Inhale 2 puffs into the lungs every 2 (two) hours as needed for wheezing  or shortness of breath (cough). Patient not taking: No sig reported 03/08/19 06/15/19  Muthersbaugh, Jarrett Soho, PA-C  omeprazole (PRILOSEC) 20 MG capsule Take 1 capsule (20 mg total) by mouth daily. Patient not taking: No sig reported 10/30/19 12/03/19  Argentina Donovan, PA-C  sucralfate (CARAFATE) 1 GM/10ML suspension Take 10 mLs (1 g total) by mouth 4 (four) times daily -  with meals and at bedtime. Patient not taking: No sig reported 06/09/19 06/15/19  Jaynee Eagles, PA-C      Allergies    Patient has no known allergies.    Review of Systems   Review of Systems  Constitutional:  Negative for chills and fever.  Gastrointestinal:  Negative for abdominal pain.  Musculoskeletal:  Positive for arthralgias and back pain. Negative for gait problem.  Neurological:  Negative for syncope.  All other systems reviewed and are negative.   Physical Exam Updated Vital Signs BP 129/78 (BP Location: Right Arm)   Pulse 78   Temp 98.8 F (37.1 C)   Resp 15   Ht '4\' 11"'$  (1.499 m)   Wt 55.8 kg   LMP 12/15/2021   SpO2 98%   BMI 24.84 kg/m  Physical Exam Vitals and nursing note reviewed.  Constitutional:      General: She is not in acute distress.    Appearance: Normal appearance. She is not ill-appearing.  HENT:     Head: Normocephalic  and atraumatic.     Nose: Nose normal.  Eyes:     General: No scleral icterus.    Extraocular Movements: Extraocular movements intact.     Conjunctiva/sclera: Conjunctivae normal.  Cardiovascular:     Rate and Rhythm: Normal rate and regular rhythm.  Pulmonary:     Effort: Pulmonary effort is normal. No respiratory distress.  Abdominal:     General: There is no distension.     Palpations: Abdomen is soft.     Tenderness: There is no abdominal tenderness. There is no guarding.  Musculoskeletal:        General: Normal range of motion.     Cervical back: Normal range of motion.     Comments: Cervical, thoracic, lumbar spine without tenderness to palpation.  Right  lumbar paraspinal muscle tenderness to palpation present.  Full range of motion in bilateral lower extremities with 5/5 strength.  Left shoulder without deformity or joint swelling.  Full range of motion in bilateral upper extremities.  Tenderness to palpation present over the left shoulder.  Full range of motion all digits of the left hand.  Neurovascularly intact.  Skin:    General: Skin is warm and dry.  Neurological:     General: No focal deficit present.     Mental Status: She is alert. Mental status is at baseline.     ED Results / Procedures / Treatments   Labs (all labs ordered are listed, but only abnormal results are displayed) Labs Reviewed - No data to display  EKG None  Radiology CT Lumbar Spine Wo Contrast  Result Date: 05/29/2022 CLINICAL DATA:  Trauma EXAM: CT LUMBAR SPINE WITHOUT CONTRAST TECHNIQUE: Multidetector CT imaging of the lumbar spine was performed without intravenous contrast administration. Multiplanar CT image reconstructions were also generated. RADIATION DOSE REDUCTION: This exam was performed according to the departmental dose-optimization program which includes automated exposure control, adjustment of the mA and/or kV according to patient size and/or use of iterative reconstruction technique. COMPARISON:  Lumbar spine radiograph 03/22/22 FINDINGS: Segmentation: There are 4 lumbar type non-rib-bearing vertebral bodies. The last well-formed disc space is labeled L4-L5. There is sacralization of L5. Alignment: Mild retrolisthesis of L4 on L5, unchanged from prior. Vertebrae: Degenerative endplate changes at the inferior and superior endplates of L4 on L5. Paraspinal and other soft tissues: Negative. Disc levels: The upper lumbar spine disc space levels till the L2-L3 disc space are unremarkable. At L3-L4 there is a circumferential disc bulge with ligamentum flavum hypertrophy that results in mild-to-moderate spinal canal narrowing. At L4-L5 there is a  circumferential disc bulge with a right foraminal component (series 7, image 81) that results in moderate right neural foraminal narrowing. L5-S1 is unremarkable. 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 The last well-formed disc space is labeled L4-L5. 1. No evidence of acute traumatic osseous injury. 2. At L4-L5 there is a circumferential disc bulge with a right foraminal component that results in moderate right neural foraminal narrowing. 3. At L3-L4 there is a circumferential disc bulge with ligamentum flavum hypertrophy that results in mild-to-moderate spinal canal narrowing. Electronically Signed   By: Marin Roberts M.D.   On: 05/29/2022 16:23   DG Hand Complete Left  Result Date: 05/29/2022 CLINICAL DATA:  [place] yesterday, fifth digit pain EXAM: LEFT HAND - COMPLETE 3+ VIEW COMPARISON:  06/08/2008 FINDINGS: Frontal, oblique, and lateral views of the left hand are obtained. No acute fracture, subluxation, or dislocation. Joint spaces are well preserved. Soft tissues are  normal. IMPRESSION: 1. Unremarkable left hand. Specifically, no abnormality of the left fifth digit. Electronically Signed   By: Randa Ngo M.D.   On: 05/29/2022 15:29   DG Shoulder Left  Result Date: 05/29/2022 CLINICAL DATA:  Left shoulder pain, fell yesterday EXAM: LEFT SHOULDER - 2+ VIEW COMPARISON:  None Available. FINDINGS: Frontal, transscapular, and axillary views of the left shoulder are obtained. No fracture, subluxation, or dislocation. Joint spaces are well preserved. Soft tissues are normal. Left chest is clear. IMPRESSION: 1. Unremarkable left shoulder. Electronically Signed   By: Randa Ngo M.D.   On: 05/29/2022 15:28    Procedures Procedures    Medications Ordered in ED Medications  HYDROcodone-acetaminophen (NORCO/VICODIN) 5-325 MG per tablet 1 tablet (1 tablet Oral Given 05/29/22 1631)  ketorolac (TORADOL) 15 MG/ML injection 15 mg (15 mg  Intramuscular Given 05/29/22 1631)    ED Course/ Medical Decision Making/ A&P                           Medical Decision Making Risk Prescription drug management.   Medical Decision Making / ED Course   This patient presents to the ED for concern of back pain, shoulder pain, left little finger pain, this involves an extensive number of treatment options, and is a complaint that carries with it a high risk of complications and morbidity.  The differential diagnosis includes fracture, muscle strain, lumbar spinal process fracture, sciatica, cauda equina syndrome  MDM: 36 year old female with past medical history significant for degenerative disc disease presents today for evaluation of above-mentioned complaints.  Overall patient is well-appearing without acute distress.  Without spinal tenderness or step-offs.  Lumbar paraspinal tenderness to palpation present on the right.  Negative straight leg raise test bilaterally.  However she does have radiculopathy on the right.  X-ray of the left hand, left shoulder unremarkable.  Shoulder pain likely muscle strain.  CT lumbar spine shows evidence of disc bulge at L4-L5 but no acute fracture or other bony injury.  Discussed symptomatic management with patient.  However she is hesitant with non-narcotic pain medicine due to severity of her pain.  Initially we discussed few doses of pain medication to keep on hand for severe or breakthrough pain.  Upon review of PDMP patient was recently prescribed 30 days worth of Norco.  Discussed with patient that she can continue using the recently prescribed Norco in addition to the discussed multimodal pain regimen below. Discussed use of steroid Dosepak, Tylenol, lidocaine patch.  We discussed NSAIDs however she does not want this.  Return precautions discussed.  Patient voices understanding and is in agreement with plan.  Patient did not have a head injury during the fall.  Denies loss of consciousness.   Lab  Tests: -I ordered, reviewed, and interpreted labs.   The pertinent results include:   Labs Reviewed - No data to display    EKG  EKG Interpretation  Date/Time:    Ventricular Rate:    PR Interval:    QRS Duration:   QT Interval:    QTC Calculation:   R Axis:     Text Interpretation:           Imaging Studies ordered: I ordered imaging studies including left hand x-ray, left shoulder x-ray, CT lumbar spine I independently visualized and interpreted imaging. I agree with the radiologist interpretation   Medicines ordered and prescription drug management: Meds ordered this encounter  Medications   HYDROcodone-acetaminophen (NORCO/VICODIN) 5-325 MG  per tablet 1 tablet   ketorolac (TORADOL) 15 MG/ML injection 15 mg    -I have reviewed the patients home medicines and have made adjustments as needed   Reevaluation: After the interventions noted above, I reevaluated the patient and found that they have :improved  Co morbidities that complicate the patient evaluation  Past Medical History:  Diagnosis Date   Anxiety    Arthritis    Chronic back pain    Degeneration of intervertebral disc of lumbar region 07/30/2019   Degenerative disc disease, cervical    H pylori ulcer    H/O degenerative disc disease 2013   History of hiatal hernia    Low back pain    Numbness    Whole back    Pilonidal cyst       Dispostion: Patient is appropriate for discharge.  Discharged in stable condition.  Return precaution discussed.  Patient voices understanding and is in agreement with plan.  Final Clinical Impression(s) / ED Diagnoses Final diagnoses:  Acute right-sided low back pain with right-sided sciatica  Muscle strain of left shoulder, initial encounter    Rx / DC Orders ED Discharge Orders          Ordered    lidocaine (LIDODERM) 5 %  Every 24 hours        05/29/22 1725    predniSONE (STERAPRED UNI-PAK 21 TAB) 10 MG (21) TBPK tablet  Daily        05/29/22 1725               Evlyn Courier, PA-C 05/29/22 1729    Jeanell Sparrow, DO 05/31/22 1619

## 2022-05-29 NOTE — Discharge Instructions (Signed)
Your workup today was overall reassuring.  Your CT scan showed a disc bulge however no bony fracture or other bony injury.  We will treat this with steroid Dosepak.  You can take 1000 mg of Tylenol in addition to this every 8 hours, I have also sent in lidocaine patch into the pharmacy for you.  You were given a dose of pain medication as well as Toradol injection.  For any concerning symptoms such as weakness, worsening pain, difficulty with controlling your bowel or bladder please return to the emergency room.  Otherwise please follow-up with your primary care provider.

## 2022-05-29 NOTE — ED Provider Triage Note (Signed)
Emergency Medicine Provider Triage Evaluation Note  Natalie Thornton , a 36 y.o. female  was evaluated in triage.  Pt complains of fall last night.  Patient notes that she tripped over her dog which caused her to fall to the floor.  Has a history of degenerative back disease and is concerned due to having associated lower back pain, left shoulder pain since the fall.  No meds tried prior to arrival.  Denies hitting her head, LOC, neck pain.  Review of Systems  Positive:  Negative:   Physical Exam  BP 129/78 (BP Location: Right Arm)   Pulse 78   Temp 98.8 F (37.1 C)   Resp 15   Ht '4\' 11"'$  (1.499 m)   Wt 55.8 kg   LMP 12/15/2021   SpO2 98%   BMI 24.84 kg/m  Gen:   Awake, no distress   Resp:  Normal effort  MSK:   Moves extremities without difficulty  Other:  Lumbar spinal tenderness to palpation.  Tenderness to palpation noted to left upper trapezius.  Tenderness to palpation noted to PIP joint of the left fifth distal phalanx.  Medical Decision Making  Medically screening exam initiated at 2:38 PM.  Appropriate orders placed.  Natalie Thornton was informed that the remainder of the evaluation will be completed by another provider, this initial triage assessment does not replace that evaluation, and the importance of remaining in the ED until their evaluation is complete.     Eldean Nanna A, PA-C 05/29/22 1442

## 2022-09-20 ENCOUNTER — Telehealth: Payer: Self-pay | Admitting: Nurse Practitioner

## 2022-09-20 NOTE — Telephone Encounter (Signed)
I spoke with the pt and she tells me that she has significant nausea and constipation.  Her last office visit with our office was in 2022.  She has an appt on 5/10 with Jill Side but is concerned because she has lost weight. She is not sure how much. She is going to call her PCP and get an appt to be seen for eval in the meantime

## 2022-09-20 NOTE — Telephone Encounter (Signed)
Patient called requesting to speak with a nurse said she has been having a lot of nausea, constipation and has lost a lot of weight seeking advise.

## 2022-09-24 ENCOUNTER — Other Ambulatory Visit: Payer: Self-pay

## 2022-09-24 ENCOUNTER — Emergency Department (HOSPITAL_COMMUNITY)
Admission: EM | Admit: 2022-09-24 | Discharge: 2022-09-24 | Disposition: A | Discharge status: Home / Self Care | Payer: 59 | Attending: Emergency Medicine | Admitting: Emergency Medicine

## 2022-09-24 ENCOUNTER — Emergency Department (HOSPITAL_COMMUNITY): Payer: 59

## 2022-09-24 DIAGNOSIS — R1013 Epigastric pain: Secondary | ICD-10-CM | POA: Diagnosis present

## 2022-09-24 DIAGNOSIS — G8929 Other chronic pain: Secondary | ICD-10-CM | POA: Diagnosis not present

## 2022-09-24 LAB — COMPREHENSIVE METABOLIC PANEL
ALT: 9 U/L (ref 0–44)
AST: 15 U/L (ref 15–41)
Albumin: 3.9 g/dL (ref 3.5–5.0)
Alkaline Phosphatase: 39 U/L (ref 38–126)
Anion gap: 6 (ref 5–15)
BUN: 7 mg/dL (ref 6–20)
CO2: 26 mmol/L (ref 22–32)
Calcium: 8.8 mg/dL — ABNORMAL LOW (ref 8.9–10.3)
Chloride: 102 mmol/L (ref 98–111)
Creatinine, Ser: 0.8 mg/dL (ref 0.44–1.00)
GFR, Estimated: >60 mL/min (ref >60)
Glucose, Bld: 97 mg/dL (ref 70–99)
Potassium: 4.2 mmol/L (ref 3.5–5.1)
Sodium: 134 mmol/L — ABNORMAL LOW (ref 135–145)
Total Bilirubin: 0.5 mg/dL (ref 0.3–1.2)
Total Protein: 7 g/dL (ref 6.5–8.1)

## 2022-09-24 LAB — CBC WITH DIFFERENTIAL/PLATELET
Abs Immature Granulocytes: 0.01 10*3/uL (ref 0.00–0.07)
Basophils Absolute: 0 10*3/uL (ref 0.0–0.1)
Basophils Relative: 0 %
Eosinophils Absolute: 0.1 10*3/uL (ref 0.0–0.5)
Eosinophils Relative: 1 %
HCT: 37.9 % (ref 36.0–46.0)
Hemoglobin: 12.5 g/dL (ref 12.0–15.0)
Immature Granulocytes: 0 %
Lymphocytes Relative: 45 %
Lymphs Abs: 2.7 10*3/uL (ref 0.7–4.0)
MCH: 31.6 pg (ref 26.0–34.0)
MCHC: 33 g/dL (ref 30.0–36.0)
MCV: 95.7 fL (ref 80.0–100.0)
Monocytes Absolute: 0.5 10*3/uL (ref 0.1–1.0)
Monocytes Relative: 7 %
Neutro Abs: 2.9 10*3/uL (ref 1.7–7.7)
Neutrophils Relative %: 47 %
Platelets: 292 10*3/uL (ref 150–400)
RBC: 3.96 MIL/uL (ref 3.87–5.11)
RDW: 11.9 % (ref 11.5–15.5)
WBC: 6.2 10*3/uL (ref 4.0–10.5)
nRBC: 0 % (ref 0.0–0.2)

## 2022-09-24 LAB — LIPASE, BLOOD: Lipase: 37 U/L (ref 11–51)

## 2022-09-24 MED ORDER — PANTOPRAZOLE SODIUM 40 MG IV SOLR
40.0000 mg | Freq: Once | INTRAVENOUS | Status: AC
  Administered 2022-09-24: 40 mg via INTRAVENOUS
  Filled 2022-09-24: qty 10

## 2022-09-24 MED ORDER — FAMOTIDINE IN NACL 20-0.9 MG/50ML-% IV SOLN
20.0000 mg | INTRAVENOUS | Status: AC
  Administered 2022-09-24: 20 mg via INTRAVENOUS
  Filled 2022-09-24: qty 50

## 2022-09-24 MED ORDER — DICYCLOMINE HCL 20 MG PO TABS: 20.0000 mg | ORAL_TABLET | Freq: Two times a day (BID) | ORAL | 0 refills | Status: DC | PRN

## 2022-09-24 MED ORDER — OMEPRAZOLE 20 MG PO CPDR: 20.0000 mg | DELAYED_RELEASE_CAPSULE | Freq: Every day | ORAL | 1 refills | Status: DC

## 2022-09-24 NOTE — ED Triage Notes (Signed)
Patient coming to ED for evaluation of abdominal pain x several months.  Reports "now everything I eat hurt my stomach.  I use to could eat fruit, but now I can't do that."  Reports 40 pound + weight loss, changes in bowel movements, and bloating.  Has not been seen for symptoms and has been attempting to treat at home.

## 2022-09-24 NOTE — Discharge Instructions (Signed)
Your evaluation in the emergency department today has been reassuring.  We recommend follow-up with gastroenterology.  You have been started on omeprazole.  Take daily as prescribed.  You may use Bentyl for management of abdominal pain/cramping if symptoms or not improved with Tylenol.  Return to the ED for new or concerning symptoms.

## 2022-09-24 NOTE — ED Notes (Signed)
Patient verbalizes understanding of discharge instructions. Opportunity for questioning and answers were provided. Armband removed by staff, pt discharged from ED. Ambulated out to lobby  

## 2022-09-24 NOTE — ED Provider Notes (Signed)
[place] EMERGENCY DEPARTMENT AT [facility] Provider Note   CSN: [id number] Arrival date & time: 09/24/22  0208     History  Chief Complaint  Patient presents with   Abdominal Pain    Natalie Thornton is a 37 y.o. female.  36-year female presents to the emergency department for evaluation of abdominal pain.  She reports that abdominal pain has been present for approximately 8 months.  Pain is often postprandial or present while eating.  As a result, patient has significantly restricted her diet.  She used to be able to eat fruit without exacerbation of pain, but states that this does not help limit her symptoms anymore.  She eats very little on a daily basis; continues to drink fluids, but reports this sometimes worsens her pain as well.  She has not yet followed with a gastroenterologist.  Did see her primary care doctor 1 month ago, but neglected to discuss her ongoing issues with abdominal pain.  Surgical history notable for vaginal hysterectomy and bilateral salpingectomy.  The patient has not had any fevers, urinary symptoms, vomiting, melena, hematochezia.  The history is provided by the patient. No language interpreter was used.  Abdominal Pain      Home Medications Prior to Admission medications   Medication Sig Start Date End Date Taking? Authorizing Provider  dicyclomine (BENTYL) 20 MG tablet Take 1 tablet (20 mg total) by mouth every 12 (twelve) hours as needed (for abdominal pain/cramping). 09/24/22  Yes Antony Madura, PA-C  omeprazole (PRILOSEC) 20 MG capsule Take 1 capsule (20 mg total) by mouth daily. 09/24/22  Yes Antony Madura, PA-C  ibuprofen (ADVIL) 200 MG tablet Take 200 mg by mouth every 6 (six) hours as needed for mild pain.    [provider]  albuterol (VENTOLIN HFA) 108 (90 Base) MCG/ACT inhaler Inhale 2 puffs into the lungs every 2 (two) hours as needed for wheezing or shortness of breath (cough). Patient not taking: No sig reported 03/08/19  06/15/19  Muthersbaugh, Dahlia Client, PA-C  sucralfate (CARAFATE) 1 GM/10ML suspension Take 10 mLs (1 g total) by mouth 4 (four) times daily -  with meals and at bedtime. Patient not taking: No sig reported 06/09/19 06/15/19  Wallis Bamberg, PA-C      Allergies    Patient has no known allergies.    Review of Systems   Review of Systems  Gastrointestinal:  Positive for abdominal pain.  Ten systems reviewed and are negative for acute change, except as noted in the HPI.    Physical Exam Updated Vital Signs BP 129/80   Pulse 70   Temp (!) 97.5 F (36.4 C) (Oral)   Resp 18   Ht  (1.499 m)   Wt 55.8 kg   LMP 12/15/2021   SpO2 100%   BMI 24.84 kg/m   Physical Exam Vitals and nursing note reviewed.  Constitutional:      General: She is not in acute distress.    Appearance: She is well-developed. She is not diaphoretic.     Comments: Nontoxic appearing.   HENT:     Head: Normocephalic and atraumatic.  Eyes:     General: No scleral icterus.    Conjunctiva/sclera: Conjunctivae normal.  Cardiovascular:     Rate and Rhythm: Normal rate and regular rhythm.     Pulses: Normal pulses.  Pulmonary:     Effort: Pulmonary effort is normal. No respiratory distress.     Comments: Respirations even and unlabored Abdominal:  General: There is no distension.     Palpations: Abdomen is soft. There is no mass.     Comments: General tenderness to palpation with mild guarding on deep palpation in the epigastrium. Negative Murphy's sign.  Musculoskeletal:        General: Normal range of motion.     Cervical back: Normal range of motion.  Skin:    General: Skin is warm and dry.     Coloration: Skin is not pale.     Findings: No erythema or rash.  Neurological:     Mental Status: She is alert and oriented to person, place, and time.     Coordination: Coordination normal.  Psychiatric:        Mood and Affect: Affect is tearful (when conversing on symptom chronicity.).        Behavior: Behavior  is withdrawn.     ED Results / Procedures / Treatments   Labs (all labs ordered are listed, but only abnormal results are displayed) Labs Reviewed  COMPREHENSIVE METABOLIC PANEL - Abnormal; Notable for the following components:      Result Value   Sodium 134 (*)    Calcium 8.8 (*)    All other components within normal limits  CBC WITH DIFFERENTIAL/PLATELET  LIPASE, BLOOD    EKG None  Radiology US Abdomen Limited  Result Date: 09/24/2022 CLINICAL DATA:  Right upper quadrant pain. EXAM: ULTRASOUND ABDOMEN LIMITED RIGHT UPPER QUADRANT COMPARISON:  March 03, 2021 FINDINGS: Gallbladder: No gallstones or wall thickening visualized (2.1 mm). No sonographic Murphy sign noted by sonographer. Common bile duct: Diameter: 1.6 mm Liver: No focal lesion identified. Diffusely increased echogenicity of the liver parenchyma is noted. Portal vein is patent on color Doppler imaging with normal direction of blood flow towards the liver. Other: None. IMPRESSION: Hepatic steatosis without focal liver lesions. Electronically Signed   By: Aram Candela M.D.   On: 09/24/2022 03:47    Procedures Procedures    Medications Ordered in ED Medications  famotidine (PEPCID) IVPB 20 mg premix (0 mg Intravenous Stopped 09/24/22 0507)  pantoprazole (PROTONIX) injection 40 mg (40 mg Intravenous Given 09/24/22 0323)    ED Course/ Medical Decision Making/ A&P Clinical Course as of 09/24/22 0509  Sat Sep 24, 2022  0449 Labs reviewed.  There is no leukocytosis or left shift.  No electrolyte derangements.  Liver and kidney function preserved.  Lipase is also normal excluding pancreatitis.  Patient's ultrasound imaging does not show any evidence of biliary pathology.  There is hepatic steatosis without evidence of liver lesion. [KH]    Clinical Course User Index [KH] Antony Madura, PA-C                             Medical Decision Making Amount and/or Complexity of Data Reviewed Labs: ordered. Radiology:  ordered.  Risk Prescription drug management.   This patient presents to the ED for concern of abdominal pain x 8 months, this involves an extensive number of treatment options, and is a complaint that carries with it a high risk of complications and morbidity.  The differential diagnosis includes gastritis/PUD vs constipation vs gallbladder pathology vs IBS vs IBD   Co morbidities that complicate the patient evaluation  Chronic back pain Anxiety    Additional history obtained:  External records from outside source obtained and reviewed including CT abdomen/pelvis from August 2023 which was negative for acute abdominopelvic etiology.   Lab Tests:  I Ordered,  and personally interpreted labs.  The pertinent results include:  normal CBC, CMP, lipase.    Imaging Studies ordered:  I ordered imaging studies including [place] RUQ  I independently visualized and interpreted imaging which showed steatosis of the liver without focal lesion I agree with the radiologist interpretation   Cardiac Monitoring:  The patient was maintained on a cardiac monitor.  I personally viewed and interpreted the cardiac monitored which showed an underlying rhythm of: NSR   Medicines ordered and prescription drug management:  I ordered medication including Pepcid and protonix for pain  Reevaluation of the patient after these medicines showed that the patient stayed the same I have reviewed the patients home medicines and have made adjustments as needed   Test Considered:  UA - however, patient denies urinary symptoms; UTI felt unlikely given symptom chronicity.   Problem List / ED Course:  As above   Reevaluation:  After the interventions noted above, I reevaluated the patient and found that they have :stayed the same   Social Determinants of Health:  Insured patient   Dispostion:  After consideration of the diagnostic results and the patients response to treatment, I feel that the  patent would benefit from outpatient GI follow up for further evaluation of ongoing pain. Patient started on PPI and given Bentyl for pain control PRN. Return precautions discussed and provided. Patient discharged in stable condition with no unaddressed concerns.          Final Clinical Impression(s) / ED Diagnoses Final diagnoses:  Chronic abdominal pain    Rx / DC Orders ED Discharge Orders          Ordered    Ambulatory referral to Gastroenterology        09/24/22 0458    dicyclomine (BENTYL) 20 MG tablet  Every 12 hours PRN        09/24/22 0459    omeprazole (PRILOSEC) 20 MG capsule  Daily        09/24/22 0459              Antony Madura, PA-C 09/24/22 1610    Wilkie Aye, Mayer Masker, MD 09/24/22 [phone]

## 2022-10-14 ENCOUNTER — Ambulatory Visit: Payer: Commercial Managed Care - HMO | Admitting: Nurse Practitioner

## 2022-11-15 ENCOUNTER — Ambulatory Visit: Payer: Commercial Managed Care - HMO | Admitting: Physician Assistant

## 2023-04-08 ENCOUNTER — Emergency Department (HOSPITAL_COMMUNITY): Payer: Medicaid Other

## 2023-04-08 ENCOUNTER — Encounter (HOSPITAL_COMMUNITY): Payer: Self-pay

## 2023-04-08 ENCOUNTER — Emergency Department (HOSPITAL_COMMUNITY)
Admission: EM | Admit: 2023-04-08 | Discharge: 2023-04-08 | Disposition: A | Discharge status: Home / Self Care | Payer: Medicaid Other | Attending: Emergency Medicine | Admitting: Emergency Medicine

## 2023-04-08 ENCOUNTER — Other Ambulatory Visit: Payer: Self-pay

## 2023-04-08 DIAGNOSIS — S61411A Laceration without foreign body of right hand, initial encounter: Secondary | ICD-10-CM | POA: Diagnosis not present

## 2023-04-08 DIAGNOSIS — W500XXA Accidental hit or strike by another person, initial encounter: Secondary | ICD-10-CM | POA: Insufficient documentation

## 2023-04-08 DIAGNOSIS — S6991XA Unspecified injury of right wrist, hand and finger(s), initial encounter: Secondary | ICD-10-CM | POA: Diagnosis present

## 2023-04-08 MED ORDER — AMOXICILLIN-POT CLAVULANATE 875-125 MG PO TABS: 1.0000 | ORAL_TABLET | Freq: Two times a day (BID) | ORAL | 0 refills | Status: AC

## 2023-04-08 MED ORDER — LIDOCAINE HCL (PF) 1 % IJ SOLN
5.0000 mL | Freq: Once | INTRAMUSCULAR | Status: AC
  Administered 2023-04-08: 5 mL
  Filled 2023-04-08: qty 5

## 2023-04-08 MED ORDER — IBUPROFEN 400 MG PO TABS
400.0000 mg | ORAL_TABLET | Freq: Once | ORAL | Status: DC
  Filled 2023-04-08: qty 1

## 2023-04-08 NOTE — ED Notes (Signed)
Pt eloped again without telling nursing staff. MD aware.

## 2023-04-08 NOTE — ED Notes (Signed)
Pt left wo notifying nursing staff. MD made aware.

## 2023-04-08 NOTE — Discharge Instructions (Signed)
Turn to the ED if you develop redness or drainage to your injury site, versus chills, or any new or concerning symptoms.
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
## 2023-06-13 ENCOUNTER — Other Ambulatory Visit: Payer: Self-pay

## 2023-06-13 ENCOUNTER — Emergency Department (HOSPITAL_COMMUNITY)
Admission: EM | Admit: 2023-06-13 | Discharge: 2023-06-14 | Discharge status: Against Medical Advice | Payer: Medicaid Other | Attending: Emergency Medicine | Admitting: Emergency Medicine

## 2023-06-13 DIAGNOSIS — G8929 Other chronic pain: Secondary | ICD-10-CM | POA: Diagnosis not present

## 2023-06-13 DIAGNOSIS — M549 Dorsalgia, unspecified: Secondary | ICD-10-CM | POA: Diagnosis present

## 2023-06-13 DIAGNOSIS — Z5321 Procedure and treatment not carried out due to patient leaving prior to being seen by health care provider: Secondary | ICD-10-CM | POA: Diagnosis not present

## 2023-06-13 NOTE — ED Triage Notes (Signed)
 Pt complains of chronic back pain and has degenerative disc disease. Pt was moving furniture yesterday and Pt states "I think whatever space I have left is gone" Pt is scheduled to have back surgery but due to insurance has been unable to have surgery.

## 2023-06-14 ENCOUNTER — Emergency Department (HOSPITAL_COMMUNITY): Payer: Medicaid Other

## 2023-06-14 NOTE — ED Notes (Signed)
Pt called multiple time no answer

## 2023-06-14 NOTE — ED Notes (Addendum)
Pt seen leaving  

## 2023-07-12 ENCOUNTER — Telehealth: Payer: Medicaid Other | Admitting: Physician Assistant

## 2023-07-12 DIAGNOSIS — H1031 Unspecified acute conjunctivitis, right eye: Secondary | ICD-10-CM

## 2023-07-12 MED ORDER — POLYMYXIN B-TRIMETHOPRIM 10000-0.1 UNIT/ML-% OP SOLN
OPHTHALMIC | 0 refills | Status: AC
Start: 2023-07-12 — End: ?

## 2023-07-12 MED ORDER — AZITHROMYCIN 250 MG PO TABS
ORAL_TABLET | ORAL | 0 refills | Status: AC
Start: 2023-07-12 — End: 2023-07-17

## 2023-07-12 NOTE — Progress Notes (Signed)
 Virtual Visit Consent   Natalie Thornton, you are scheduled for a virtual visit with a [facility] provider today. Just as with appointments in the office, your consent must be obtained to participate. Your consent will be active for this visit and any virtual visit you may have with one of our providers in the next 365 days. If you have a MyChart account, a copy of this consent can be sent to you electronically.  As this is a virtual visit, video technology does not allow for your provider to perform a traditional examination. This may limit your provider's ability to fully assess your condition. If your provider identifies any concerns that need to be evaluated in person or the need to arrange testing (such as labs, EKG, etc.), we will make arrangements to do so. Although advances in technology are sophisticated, we cannot ensure that it will always work on either your end or our end. If the connection with a video visit is poor, the visit may have to be switched to a telephone visit. With either a video or telephone visit, we are not always able to ensure that we have a secure connection.  By engaging in this virtual visit, you consent to the provision of healthcare and authorize for your insurance to be billed (if applicable) for the services provided during this visit. Depending on your insurance coverage, you may receive a charge related to this service.  I need to obtain your verbal consent now. Are you willing to proceed with your visit today? Natalie Thornton has provided verbal consent on 07/12/2023 for a virtual visit (video or telephone). Natalie Thornton, [place]  Date: 07/12/2023 9:57 AM  Virtual Visit via Video Note   I, Natalie Thornton, connected with  Natalie Thornton  ([id number], 1986/03/04) on 07/12/23 at  9:45 AM EST by a video-enabled telemedicine application and verified that I am speaking with the correct person using two identifiers.  Location: Patient: Virtual Visit Location  Patient: Home Provider: Virtual Visit Location Provider: Home Office   I discussed the limitations of evaluation and management by telemedicine and the availability of in person appointments. The patient expressed understanding and agreed to proceed.    History of Present Illness: Natalie Thornton is a 38 y.o. who identifies as a female who was assigned female at birth, and is being seen today for 2-3 days of irritation/discomfort of the R eye, first noted while laying on her bed. Notes she rubbed at the area as she felt was an eyelash but unable to get anything out. Notes eye redness and drainage/matting developing since then. Denies fever, chills. Denies URI symptoms. Denies any symptoms of other eye. Does not wear contact lenses.   HPI: HPI  Problems:  Patient Active Problem List   Diagnosis Date Noted   H/O vaginal hysterectomy 01/12/2022   History of bilateral salpingectomy 01/12/2022   Vaginitis 01/12/2022   Post-operative state 12/28/2021   Epigastric pain 09/10/2020   Lipoma of back 06/05/2019   Lumbar foraminal stenosis 03/13/2019   Neck pain 01/18/2019   S/P excision of lipoma 09/12/2018   Chronic bilateral thoracic back pain 07/17/2018   DDD (degenerative disc disease), lumbar 06/14/2018   Anxiety and depression 01/06/2017   Cigarette smoker 10/20/2016   Solitary pulmonary nodule 10/20/2016    Allergies: No Known Allergies Medications:  Current Outpatient Medications:    azithromycin  (ZITHROMAX ) 250 MG tablet, Take 2 tablets on day 1, then 1 tablet daily on days 2 through  5, Disp: 6 tablet, Rfl: 0   trimethoprim -polymyxin b  (POLYTRIM ) ophthalmic solution, Apply 1-2 drops into affected eye QID x 5 days., Disp: 10 mL, Rfl: 0  Observations/Objective: Patient is well-developed, well-nourished in no acute distress.  Resting comfortably at home.  Head is normocephalic, atraumatic.  No labored breathing. Speech is clear and coherent with logical content.  Patient is alert  and oriented at baseline.  Right conjunctival injection with drainage noted. Left conjunctiva within normal limits.   Assessment and Plan: 1. Acute bacterial conjunctivitis of right eye (Primary) - trimethoprim -polymyxin b  (POLYTRIM ) ophthalmic solution; Apply 1-2 drops into affected eye QID x 5 days.  Dispense: 10 mL; Refill: 0 - azithromycin  (ZITHROMAX ) 250 MG tablet; Take 2 tablets on day 1, then 1 tablet daily on days 2 through 5  Dispense: 6 tablet; Refill: 0  Supportive measures and OTC medications reviewed. Rx polytrim . She is worried about the drop burning her eye. Discussed if this is an issue, start the Azithromycin  PO, taking as directed.  Follow Up Instructions: I discussed the assessment and treatment plan with the patient. The patient was provided an opportunity to ask questions and all were answered. The patient agreed with the plan and demonstrated an understanding of the instructions.  A copy of instructions were sent to the patient via MyChart unless otherwise noted below.   The patient was advised to call back or seek an in-person evaluation if the symptoms worsen or if the condition fails to improve as anticipated.    Natalie Velma Lunger, PA-C

## 2023-07-12 NOTE — Patient Instructions (Signed)
 Natalie Thornton, thank you for joining [name], PA-C for today's virtual visit.  While this provider is not your primary care provider (PCP), if your PCP is located in our provider database this encounter information will be shared with them immediately following your visit.   A [facility] MyChart account gives you access to today's visit and all your visits, tests, and labs performed at [facility]  click here if you don't have a [facility] MyChart account or go to [email or web address]  Consent: (Patient) Natalie Thornton provided verbal consent for this virtual visit at the beginning of the encounter.  Current Medications:  Current Outpatient Medications:    azithromycin  (ZITHROMAX ) 250 MG tablet, Take 2 tablets on day 1, then 1 tablet daily on days 2 through 5, Disp: 6 tablet, Rfl: 0   trimethoprim -polymyxin b  (POLYTRIM ) ophthalmic solution, Apply 1-2 drops into affected eye QID x 5 days., Disp: 10 mL, Rfl: 0   Medications ordered in this encounter:  Meds ordered this encounter  Medications   trimethoprim -polymyxin b  (POLYTRIM ) ophthalmic solution    Sig: Apply 1-2 drops into affected eye QID x 5 days.    Dispense:  10 mL    Refill:  0    Supervising Provider:   BLAISE ALEENE KIDD [[id number]]   azithromycin  (ZITHROMAX ) 250 MG tablet    Sig: Take 2 tablets on day 1, then 1 tablet daily on days 2 through 5    Dispense:  6 tablet    Refill:  0    Supervising Provider:   LAMPTEY, PHILIP O [phone]     *If you need refills on other medications prior to your next appointment, please contact your pharmacy*  Follow-Up: Call back or seek an in-person evaluation if the symptoms worsen or if the condition fails to improve as anticipated.  [facility] [phone]  Other Instructions Bacterial Conjunctivitis, Adult Bacterial conjunctivitis is an infection of your conjunctiva. This is the clear membrane that covers the white part of your eye  and the inner part of your eyelid. This infection can make your eye: Red or pink. Itchy or irritated. This condition spreads easily from person to person (is contagious) and from one eye to the other eye. What are the causes? This condition is caused by germs (bacteria). You may get the infection if you come into close contact with: A person who has the infection. Items that have germs on them (are contaminated), such as face towels, contact lens solution, or eye makeup. What increases the risk? You are more likely to get this condition if: You have contact with people who have the infection. You wear contact lenses. You have a sinus infection. You have had a recent eye injury or surgery. You have a weak body defense system (immune system). You have dry eyes. What are the signs or symptoms?  Thick, yellowish discharge from the eye. Tearing or watery eyes. Itchy eyes. Burning feeling in your eyes. Eye redness. Swollen eyelids. Blurred vision. How is this treated?  Antibiotic eye drops or ointment. Antibiotic medicine taken by mouth. This is used for infections that do not get better with drops or ointment or that last more than 10 days. Cool, wet cloths placed on the eyes. Artificial tears used 2-6 times a day. Follow these instructions at home: Medicines Take or apply your antibiotic medicine as told by your doctor. Do not stop using it even if you start to feel better. Take or apply over-the-counter and  prescription medicines only as told by your doctor. Do not touch your eyelid with the eye-drop bottle or the ointment tube. Managing discomfort Wipe any fluid from your eye with a warm, wet washcloth or a cotton ball. Place a clean, cool, wet cloth on your eye. Do this for 10-20 minutes, 3-4 times a day. General instructions Do not wear contacts until the infection is gone. Wear glasses until your doctor says it is okay to wear contacts again. Do not wear eye makeup until the  infection is gone. Throw away old eye makeup. Change or wash your pillowcase every day. Do not share towels or washcloths. Wash your hands often with soap and water for at least 20 seconds and especially before touching your face or eyes. Use paper towels to dry your hands. Do not touch or rub your eyes. Do not drive or use heavy machinery if your vision is blurred. Contact a doctor if: You have a fever. You do not get better after 10 days. Get help right away if: You have a fever and your symptoms get worse all of a sudden. You have very bad pain when you move your eye. Your face: Hurts. Is red. Is swollen. You have sudden loss of vision. Summary Bacterial conjunctivitis is an infection of your conjunctiva. This infection spreads easily from person to person. Wash your hands often with soap and water for at least 20 seconds and especially before touching your face or eyes. Use paper towels to dry your hands. Take or apply your antibiotic medicine as told by your doctor. Contact a doctor if you have a fever or you do not get better after 10 days. This information is not intended to replace advice given to you by your health care provider. Make sure you discuss any questions you have with your health care provider. Document Revised: 09/02/2020 Document Reviewed: 09/02/2020 Elsevier Patient Education  2024 Elsevier Inc.   If you have been instructed to have an in-person evaluation today at a local Urgent Care facility, please use the link below. It will take you to a list of all of our available [facility], including address, phone number and hours of operation. Please do not delay care.  [facility]  If you or a family member do not have a primary care provider, use the link below to schedule a visit and establish care. When you choose a [facility] primary care physician or advanced practice provider, you gain a long-term partner in health. Find a Primary  Care Provider  Learn more about [facility] in-office and virtual care options:  - Get Care Now

## 2023-08-12 ENCOUNTER — Telehealth: Admitting: Physician Assistant

## 2023-08-12 DIAGNOSIS — G4700 Insomnia, unspecified: Secondary | ICD-10-CM

## 2023-08-12 NOTE — Progress Notes (Signed)
 Virtual Visit Consent   Natalie Thornton, you are scheduled for a virtual visit with a [facility] provider today. Just as with appointments in the office, your consent must be obtained to participate. Your consent will be active for this visit and any virtual visit you may have with one of our providers in the next 365 days. If you have a MyChart account, a copy of this consent can be sent to you electronically.  As this is a virtual visit, video technology does not allow for your provider to perform a traditional examination. This may limit your provider's ability to fully assess your condition. If your provider identifies any concerns that need to be evaluated in person or the need to arrange testing (such as labs, EKG, etc.), we will make arrangements to do so. Although advances in technology are sophisticated, we cannot ensure that it will always work on either your end or our end. If the connection with a video visit is poor, the visit may have to be switched to a telephone visit. With either a video or telephone visit, we are not always able to ensure that we have a secure connection.  By engaging in this virtual visit, you consent to the provision of healthcare and authorize for your insurance to be billed (if applicable) for the services provided during this visit. Depending on your insurance coverage, you may receive a charge related to this service.  I need to obtain your verbal consent now. Are you willing to proceed with your visit today? Natalie Thornton has provided verbal consent on 08/12/2023 for a virtual visit (video or telephone). Laure Kidney, [place]  Date: 08/12/2023 3:29 PM   Virtual Visit via Video Note   I, Laure Kidney, connected with  Natalie Thornton  ([id number], [birth year]) on 08/12/23 at  3:15 PM EST by a video-enabled telemedicine application and verified that I am speaking with the correct person using two identifiers.  Location: Patient: Virtual Visit Location Patient:  Home Provider: Virtual Visit Location Provider: Home Office   I discussed the limitations of evaluation and management by telemedicine and the availability of in person appointments. The patient expressed understanding and agreed to proceed.    History of Present Illness: Natalie Thornton is a 38 y.o. who identifies as a female who was assigned female at birth, and is being seen today for insomnia.  HPI: Insomnia Primary symptoms: difficulty falling asleep.   The current episode started in the past 7 days. The onset quality is sudden. The problem occurs nightly. The problem is unchanged. How many beverages per day that contain caffeine: 0 - 1.  Types of beverages you drink: soda. PMH includes: none.  Prior diagnostic workup includes:  No prior workup.    Problems:  Patient Active Problem List   Diagnosis Date Noted   H/O vaginal hysterectomy 01/12/2022   History of bilateral salpingectomy 01/12/2022   Vaginitis 01/12/2022   Post-operative state 12/28/2021   Epigastric pain 09/10/2020   Lipoma of back 06/05/2019   Lumbar foraminal stenosis 03/13/2019   Neck pain 01/18/2019   S/P excision of lipoma 09/12/2018   Chronic bilateral thoracic back pain 07/17/2018   DDD (degenerative disc disease), lumbar 06/14/2018   Anxiety and depression 01/06/2017   Cigarette smoker 10/20/2016   Solitary pulmonary nodule 10/20/2016    Allergies: No Known Allergies Medications:  Current Outpatient Medications:    trimethoprim-polymyxin b (POLYTRIM) ophthalmic solution, Apply 1-2 drops into affected eye QID x 5 days., Disp: 10  mL, Rfl: 0  Observations/Objective: Patient is well-developed, well-nourished in no acute distress.  Resting comfortably  at home.  Head is normocephalic, atraumatic.  No labored breathing.  Speech is clear and coherent with logical content.  Patient is alert and oriented at baseline.    Assessment and Plan: 1. Insomnia, unspecified type (Primary)  Recommended in person  evaluation. No inciting events prior to symptom onset. Patient to report to ER for evaluation.   Follow Up Instructions: I discussed the assessment and treatment plan with the patient. The patient was provided an opportunity to ask questions and all were answered. The patient agreed with the plan and demonstrated an understanding of the instructions.  A copy of instructions were sent to the patient via MyChart unless otherwise noted below.     The patient was advised to call back or seek an in-person evaluation if the symptoms worsen or if the condition fails to improve as anticipated.    Laure Kidney, PA-C

## 2023-09-11 ENCOUNTER — Telehealth: Admitting: Physician Assistant

## 2023-09-11 DIAGNOSIS — R2231 Localized swelling, mass and lump, right upper limb: Secondary | ICD-10-CM | POA: Diagnosis not present

## 2023-09-11 DIAGNOSIS — F5101 Primary insomnia: Secondary | ICD-10-CM

## 2023-09-11 MED ORDER — METHYLPREDNISOLONE 4 MG PO TBPK
ORAL_TABLET | ORAL | 0 refills | Status: AC
Start: 2023-09-11 — End: ?

## 2023-09-11 MED ORDER — TRAZODONE HCL 50 MG PO TABS
50.0000 mg | ORAL_TABLET | Freq: Every day | ORAL | 0 refills | Status: AC
Start: 2023-09-11 — End: ?

## 2023-09-11 NOTE — Patient Instructions (Signed)
 Lenard Galloway, thank you for joining Margaretann Loveless, PA-C for today's virtual visit.  While this provider is not your primary care provider (PCP), if your PCP is located in our provider database this encounter information will be shared with them immediately following your visit.   A [facility] MyChart account gives you access to today's visit and all your visits, tests, and labs performed at [facility] " click here if you don't have a [facility] MyChart account or go to [email or web address]  Consent: (Patient) Lenard Galloway provided verbal consent for this virtual visit at the beginning of the encounter.  Current Medications:  Current Outpatient Medications:    methylPREDNISolone (MEDROL DOSEPAK) 4 MG TBPK tablet, 6 day taper; take as directed on package instructions, Disp: 21 tablet, Rfl: 0   traZODone (DESYREL) 50 MG tablet, Take 1-2 tablets (50-100 mg total) by mouth at bedtime., Disp: 90 tablet, Rfl: 0   trimethoprim-polymyxin b (POLYTRIM) ophthalmic solution, Apply 1-2 drops into affected eye QID x 5 days., Disp: 10 mL, Rfl: 0   Medications ordered in this encounter:  Meds ordered this encounter  Medications   methylPREDNISolone (MEDROL DOSEPAK) 4 MG TBPK tablet    Sig: 6 day taper; take as directed on package instructions    Dispense:  21 tablet    Refill:  0    Supervising Provider:   Merrilee Jansky [[id number]]   traZODone (DESYREL) 50 MG tablet    Sig: Take 1-2 tablets (50-100 mg total) by mouth at bedtime.    Dispense:  90 tablet    Refill:  0    Supervising Provider:   Merrilee Jansky [[id number]]     *If you need refills on other medications prior to your next appointment, please contact your pharmacy*  Follow-Up: Call back or seek an in-person evaluation if the symptoms worsen or if the condition fails to improve as anticipated.  [facility] [phone]  Other Instructions  Insomnia Insomnia is a sleep disorder that  makes it difficult to fall asleep or stay asleep. Insomnia can cause fatigue, low energy, difficulty concentrating, mood swings, and poor performance at work or school. There are three different ways to classify insomnia: Difficulty falling asleep. Difficulty staying asleep. Waking up too early in the morning. Any type of insomnia can be long-term (chronic) or short-term (acute). Both are common. Short-term insomnia usually lasts for 3 months or less. Chronic insomnia occurs at least three times a week for longer than 3 months. What are the causes? Insomnia may be caused by another condition, situation, or substance, such as: Having certain mental health conditions, such as anxiety and depression. Using caffeine, alcohol, tobacco, or drugs. Having gastrointestinal conditions, such as gastroesophageal reflux disease (GERD). Having certain medical conditions. These include: Asthma. Alzheimer's disease. Stroke. Chronic pain. An overactive thyroid gland (hyperthyroidism). Other sleep disorders, such as restless legs syndrome and sleep apnea. Menopause. Sometimes, the cause of insomnia may not be known. What increases the risk? Risk factors for insomnia include: Gender. Females are affected more often than males. Age. Insomnia is more common as people get older. Stress and certain medical and mental health conditions. Lack of exercise. Having an irregular work schedule. This may include working night shifts and traveling between different time zones. What are the signs or symptoms? If you have insomnia, the main symptom is having trouble falling asleep or having trouble staying asleep. This may lead to other symptoms, such as: Feeling tired or having low  energy. Feeling nervous about going to sleep. Not feeling rested in the morning. Having trouble concentrating. Feeling irritable, anxious, or depressed. How is this diagnosed? This condition may be diagnosed based on: Your symptoms and  medical history. Your health care provider may ask about: Your sleep habits. Any medical conditions you have. Your mental health. A physical exam. How is this treated? Treatment for insomnia depends on the cause. Treatment may focus on treating an underlying condition that is causing the insomnia. Treatment may also include: Medicines to help you sleep. Counseling or therapy. Lifestyle adjustments to help you sleep better. Follow these instructions at home: Eating and drinking  Limit or avoid alcohol, caffeinated beverages, and products that contain nicotine and tobacco, especially close to bedtime. These can disrupt your sleep. Do not eat a large meal or eat spicy foods right before bedtime. This can lead to digestive discomfort that can make it hard for you to sleep. Sleep habits  Keep a sleep diary to help you and your health care provider figure out what could be causing your insomnia. Write down: When you sleep. When you wake up during the night. How well you sleep and how rested you feel the next day. Any side effects of medicines you are taking. What you eat and drink. Make your bedroom a dark, comfortable place where it is easy to fall asleep. Put up shades or blackout curtains to block light from outside. Use a white noise machine to block noise. Keep the temperature cool. Limit screen use before bedtime. This includes: Not watching TV. Not using your smartphone, tablet, or computer. Stick to a routine that includes going to bed and waking up at the same times every day and night. This can help you fall asleep faster. Consider making a quiet activity, such as reading, part of your nighttime routine. Try to avoid taking naps during the day so that you sleep better at night. Get out of bed if you are still awake after 15 minutes of trying to sleep. Keep the lights down, but try reading or doing a quiet activity. When you feel sleepy, go back to bed. General instructions Take  over-the-counter and prescription medicines only as told by your health care provider. Exercise regularly as told by your health care provider. However, avoid exercising in the hours right before bedtime. Use relaxation techniques to manage stress. Ask your health care provider to suggest some techniques that may work well for you. These may include: Breathing exercises. Routines to release muscle tension. Visualizing peaceful scenes. Make sure that you drive carefully. Do not drive if you feel very sleepy. Keep all follow-up visits. This is important. Contact a health care provider if: You are tired throughout the day. You have trouble in your daily routine due to sleepiness. You continue to have sleep problems, or your sleep problems get worse. Get help right away if: You have thoughts about hurting yourself or someone else. Get help right away if you feel like you may hurt yourself or others, or have thoughts about taking your own life. Go to your nearest emergency room or: Call 911. Call the National Suicide Prevention Lifeline at 8567017968 or 988. This is open 24 hours a day. Text the Crisis Text Line at 269-571-0404. Summary Insomnia is a sleep disorder that makes it difficult to fall asleep or stay asleep. Insomnia can be long-term (chronic) or short-term (acute). Treatment for insomnia depends on the cause. Treatment may focus on treating an underlying condition that is causing  the insomnia. Keep a sleep diary to help you and your health care provider figure out what could be causing your insomnia. This information is not intended to replace advice given to you by your health care provider. Make sure you discuss any questions you have with your health care provider. Document Revised: 05/03/2021 Document Reviewed: 05/03/2021 Elsevier Patient Education  2024 Elsevier Inc.   If you have been instructed to have an in-person evaluation today at a local Urgent Care facility, please use  the link below. It will take you to a list of all of our available [facility], including address, phone number and hours of operation. Please do not delay care.  [facility]  If you or a family member do not have a primary care provider, use the link below to schedule a visit and establish care. When you choose a [facility] primary care physician or advanced practice provider, you gain a long-term partner in health. Find a Primary Care Provider  Learn more about [facility]'s in-office and virtual care options: [facility] - Get Care Now

## 2023-09-11 NOTE — Progress Notes (Signed)
 Virtual Visit Consent   Natalie Thornton, you are scheduled for a virtual visit with a [facility] provider today. Just as with appointments in the office, your consent must be obtained to participate. Your consent will be active for this visit and any virtual visit you may have with one of our providers in the next 365 days. If you have a MyChart account, a copy of this consent can be sent to you electronically.  As this is a virtual visit, video technology does not allow for your provider to perform a traditional examination. This may limit your provider's ability to fully assess your condition. If your provider identifies any concerns that need to be evaluated in person or the need to arrange testing (such as labs, EKG, etc.), we will make arrangements to do so. Although advances in technology are sophisticated, we cannot ensure that it will always work on either your end or our end. If the connection with a video visit is poor, the visit may have to be switched to a telephone visit. With either a video or telephone visit, we are not always able to ensure that we have a secure connection.  By engaging in this virtual visit, you consent to the provision of healthcare and authorize for your insurance to be billed (if applicable) for the services provided during this visit. Depending on your insurance coverage, you may receive a charge related to this service.  I need to obtain your verbal consent now. Are you willing to proceed with your visit today? Natalie Thornton has provided verbal consent on 09/11/2023 for a virtual visit (video or telephone). Margaretann Loveless, PA-C  Date: 09/11/2023 7:17 PM   Virtual Visit via Video Note   I, Margaretann Loveless, connected with  Natalie Thornton  ([id number], 11-12-1985) on 09/11/23 at  6:45 PM EDT by a video-enabled telemedicine application and verified that I am speaking with the correct person using two identifiers.  Location: Patient: Virtual Visit  Location Patient: Home Provider: Virtual Visit Location Provider: Home Office   I discussed the limitations of evaluation and management by telemedicine and the availability of in person appointments. The patient expressed understanding and agreed to proceed.    History of Present Illness: Natalie Thornton is a 38 y.o. who identifies as a female who was assigned female at birth, and is being seen today for 2 issues.  1) Has a firm area in the right upper trapezius area running from the neck to the shoulder. Does not move. Is not tender to touch. More firm and full appearing than on the left. Does have a history of Lipomas but reports this feels different.  2) Insomnia: Longstanding issue. Has tried Hydroxyzine in the past but it worsened her anxiety at baseline. Has tried Melatonin, also worsened her anxiety. Has not tried Unisom as far as she can remember. Trazodone was on patient list from 2019, but she cannot recall if it helped or not, but she is willing to try it again. Reports she can go days where she only gets 2-3 hours of sleep per night.    Problems:  Patient Active Problem List   Diagnosis Date Noted   H/O vaginal hysterectomy 01/12/2022   History of bilateral salpingectomy 01/12/2022   Vaginitis 01/12/2022   Post-operative state 12/28/2021   Epigastric pain 09/10/2020   Lipoma of back 06/05/2019   Lumbar foraminal stenosis 03/13/2019   Neck pain 01/18/2019   S/P excision of lipoma 09/12/2018   Chronic  bilateral thoracic back pain 07/17/2018   DDD (degenerative disc disease), lumbar 06/14/2018   Anxiety and depression 01/06/2017   Cigarette smoker 10/20/2016   Solitary pulmonary nodule 10/20/2016    Allergies: No Known Allergies Medications:  Current Outpatient Medications:    methylPREDNISolone (MEDROL DOSEPAK) 4 MG TBPK tablet, 6 day taper; take as directed on package instructions, Disp: 21 tablet, Rfl: 0   traZODone (DESYREL) 50 MG tablet, Take 1-2 tablets (50-100 mg  total) by mouth at bedtime., Disp: 90 tablet, Rfl: 0   trimethoprim-polymyxin b (POLYTRIM) ophthalmic solution, Apply 1-2 drops into affected eye QID x 5 days., Disp: 10 mL, Rfl: 0  Observations/Objective: Patient is well-developed, well-nourished in no acute distress.  Resting comfortably at home.  Head is normocephalic, atraumatic.  No labored breathing.  Speech is clear and coherent with logical content.  Patient is alert and oriented at baseline.  The right upper trapezius does appear more full in the area superior to the right clavicle between the neck and the shoulder when compared to the left, patient reports firm to the touch  Assessment and Plan: 1. Mass of skin of right shoulder (Primary) - methylPREDNISolone (MEDROL DOSEPAK) 4 MG TBPK tablet; 6 day taper; take as directed on package instructions  Dispense: 21 tablet; Refill: 0  2. Primary insomnia - traZODone (DESYREL) 50 MG tablet; Take 1-2 tablets (50-100 mg total) by mouth at bedtime.  Dispense: 90 tablet; Refill: 0  - Advised we could try Medrol for possible inflammation/trigger point of the right upper trapezius, but if not improving or if worsens to seek in person evaluation with PCP so someone can feel the mass for better determination of the cause - Discussed heating pad and massage as well - Trazodone prescribed for insomnia - Trial 50mg , then can increase to 75mg  (1.5 tablets), then increase to 100mg  (2 tablets) as needed for sleep - Follow up in person if not improving or worsens  Follow Up Instructions: I discussed the assessment and treatment plan with the patient. The patient was provided an opportunity to ask questions and all were answered. The patient agreed with the plan and demonstrated an understanding of the instructions.  A copy of instructions were sent to the patient via MyChart unless otherwise noted below.    The patient was advised to call back or seek an in-person evaluation if the symptoms worsen  or if the condition fails to improve as anticipated.    Margaretann Loveless, PA-C

## 2023-10-31 ENCOUNTER — Other Ambulatory Visit: Payer: Self-pay

## 2023-10-31 ENCOUNTER — Encounter (HOSPITAL_COMMUNITY): Payer: Self-pay

## 2023-10-31 ENCOUNTER — Emergency Department (HOSPITAL_COMMUNITY)

## 2023-10-31 ENCOUNTER — Emergency Department (HOSPITAL_COMMUNITY)
Admission: EM | Admit: 2023-10-31 | Discharge: 2023-10-31 | Disposition: A | Discharge status: Home / Self Care | Attending: Emergency Medicine | Admitting: Emergency Medicine

## 2023-10-31 DIAGNOSIS — R0789 Other chest pain: Secondary | ICD-10-CM | POA: Diagnosis present

## 2023-10-31 LAB — CBC
HCT: 39.7 % (ref 36.0–46.0)
Hemoglobin: 13.1 g/dL (ref 12.0–15.0)
MCH: 31.6 pg (ref 26.0–34.0)
MCHC: 33 g/dL (ref 30.0–36.0)
MCV: 95.7 fL (ref 80.0–100.0)
Platelets: 308 10*3/uL (ref 150–400)
RBC: 4.15 MIL/uL (ref 3.87–5.11)
RDW: 11.9 % (ref 11.5–15.5)
WBC: 5 10*3/uL (ref 4.0–10.5)
nRBC: 0 % (ref 0.0–0.2)

## 2023-10-31 LAB — BASIC METABOLIC PANEL WITH GFR
Anion gap: 8 (ref 5–15)
BUN: 7 mg/dL (ref 6–20)
CO2: 24 mmol/L (ref 22–32)
Calcium: 9 mg/dL (ref 8.9–10.3)
Chloride: 103 mmol/L (ref 98–111)
Creatinine, Ser: 0.95 mg/dL (ref 0.44–1.00)
GFR, Estimated: >60 mL/min (ref >60)
Glucose, Bld: 91 mg/dL (ref 70–99)
Potassium: 3.8 mmol/L (ref 3.5–5.1)
Sodium: 135 mmol/L (ref 135–145)

## 2023-10-31 LAB — TROPONIN I (HIGH SENSITIVITY)
Troponin I (High Sensitivity): 3 ng/L (ref <18)
Troponin I (High Sensitivity): 3 ng/L (ref <18)

## 2023-10-31 MED ORDER — ACETAMINOPHEN 500 MG PO TABS
1000.0000 mg | ORAL_TABLET | Freq: Once | ORAL | Status: AC
  Administered 2023-10-31: 1000 mg via ORAL
  Filled 2023-10-31: qty 2

## 2023-10-31 NOTE — ED Provider Notes (Signed)
 [facility] Provider Note   CSN: [id number] Arrival date & time: 10/31/23  1229     History  Chief Complaint  Patient presents with   Chest Pain   HPI Natalie Thornton is a 38 y.o. female presenting for chest pain.  She states it started about 6 days ago.  It is in the right side of chest.  She endorses that she has a "cyst" in that same area that she believes has been there for 2 or 3 years.  She believes this is what is contributing to the pain.  She states that the pain is not necessarily worse with exertion.  It does not radiate to the back.  Can at times be worse with deep breathing and endorses intermittent shortness of breath.  Denies OCP use, calf tenderness, recent immobilization, or known history of blood clots.   Chest Pain      Home Medications Prior to Admission medications   Medication Sig Start Date End Date Taking? Authorizing Provider  methylPREDNISolone  (MEDROL  DOSEPAK) 4 MG TBPK tablet 6 day taper; take as directed on package instructions 09/11/23   Angelia Kelp, PA-C  traZODone  (DESYREL ) 50 MG tablet Take 1-2 tablets (50-100 mg total) by mouth at bedtime. 09/11/23   Angelia Kelp, PA-C  trimethoprim -polymyxin b  (POLYTRIM ) ophthalmic solution Apply 1-2 drops into affected eye QID x 5 days. 07/12/23   Farris Hong, PA-C      Allergies    Patient has no known allergies.    Review of Systems   Review of Systems  Cardiovascular:  Positive for chest pain.    Physical Exam Updated Vital Signs BP 115/75 (BP Location: Left Arm)   Pulse 75   Temp 98.4 F (36.9 C) (Oral)   Resp 16   LMP 12/15/2021   SpO2 100%  Physical Exam Vitals and nursing note reviewed. Exam conducted with a chaperone present.  HENT:     Head: Normocephalic and atraumatic.     Mouth/Throat:     Mouth: Mucous membranes are moist.  Eyes:     General:        Right eye: No discharge.        Left eye: No discharge.      Conjunctiva/sclera: Conjunctivae normal.  Cardiovascular:     Rate and Rhythm: Normal rate and regular rhythm.     Pulses: Normal pulses.     Heart sounds: Normal heart sounds.  Pulmonary:     Effort: Pulmonary effort is normal.     Breath sounds: Normal breath sounds.  Chest:    Abdominal:     General: Abdomen is flat.     Palpations: Abdomen is soft.  Skin:    General: Skin is warm and dry.  Neurological:     General: No focal deficit present.  Psychiatric:        Mood and Affect: Mood normal.     ED Results / Procedures / Treatments   Labs (all labs ordered are listed, but only abnormal results are displayed) Labs Reviewed  BASIC METABOLIC PANEL WITH GFR  CBC  TROPONIN I (HIGH SENSITIVITY)  TROPONIN I (HIGH SENSITIVITY)    EKG None  Radiology DG Chest 1 View Result Date: 10/31/2023 CLINICAL DATA:  Chest pain EXAM: CHEST  1 VIEW COMPARISON:  06/14/2023 FINDINGS: The heart size and mediastinal contours are within normal limits. Both lungs are clear. The visualized skeletal structures are unremarkable. No pneumothorax. IMPRESSION: Normal study. Electronically  Signed   By: Janeece Mechanic M.D.   On: 10/31/2023 17:44    Procedures Procedures    Medications Ordered in ED Medications  acetaminophen  (TYLENOL ) tablet 1,000 mg (has no administration in time range)    ED Course/ Medical Decision Making/ A&P                                 Medical Decision Making  Initial Impression and Ddx 38 year old well-appearing female present for chest pain.  Exam notable for TTP in the right upper chest but otherwise unremarkable.  DDx includes ACS, PE, pneumothorax, abscess, other. Patient PMH that increases complexity of ED encounter:  none  Interpretation of Diagnostics - I independent reviewed and interpreted the labs as followed: no acute findings  - I independently visualized the following imaging with scope of interpretation limited to determining acute life  threatening conditions related to emergency care: CXR, which revealed no acute findings  - I personally reviewed and interpreted EKG which revealed NSR  Patient Reassessment and Ultimate Disposition/Management Chest improved somewhat after tylenol .  Considered PE but unlikely given she is PERC negative and chest pain is reproducible without signs of DVT.  Also considered ACS but unlikely given right sided reproducible chest pain and lack of risk factors.  It is possible she may have a small cyst in her chest in that area.  Advised her to follow-up with general surgery and her PCP.  Discussed return precautions.  Discharged in good condition.  Patient management required discussion with the following services or consulting groups:  None  Complexity of Problems Addressed Acute complicated illness or Injury  Additional Data Reviewed and Analyzed Further history obtained from: Further history from spouse/family member, Past medical history and medications listed in the EMR, and Prior ED visit notes  Patient Encounter Risk Assessment None         Final Clinical Impression(s) / ED Diagnoses Final diagnoses:  Atypical chest pain    Rx / DC Orders ED Discharge Orders     None         Janalee Mcmurray, PA-C 10/31/23 1752    Iva Mariner, MD 11/01/23 [phone]

## 2023-10-31 NOTE — Discharge Instructions (Signed)
 Evaluation for your chest pain was overall reassuring.  X-ray was normal.  Labs were also reassuring as well.  If you have had issues with a possible cyst in your chest, or do recommend that you follow-up with general surgery.  Also recommend follow-up with your PCP.  If your chest pain changes or is worse, you develop shortness of breath, swelling or tenderness in your calves or any other concerning symptom please return to the ED for further evaluation.

## 2023-10-31 NOTE — ED Provider Triage Note (Signed)
 Emergency Medicine Provider Triage Evaluation Note  Natalie Thornton , a 38 y.o. female  was evaluated in triage.  Pt complains of R chest pain X 3 days.  Review of Systems  Positive: Chest pain, SOB Negative: fever  Physical Exam  BP (!) 138/91   Pulse 72   Temp 98.2 F (36.8 C) (Oral)   Resp 15   LMP 12/15/2021   SpO2 100%  Gen:   Awake, no distress   Resp:  Normal effort  MSK:   Moves extremities without difficulty  Other:    Medical Decision Making  Medically screening exam initiated at 12:55 PM.  Appropriate orders placed.  Natalie Thornton was informed that the remainder of the evaluation will be completed by another provider, this initial triage assessment does not replace that evaluation, and the importance of remaining in the ED until their evaluation is complete.     Aimee Houseman, [place] 10/31/23 1256

## 2023-10-31 NOTE — ED Triage Notes (Signed)
 Pt c/o r sided cp x past few days; described as  "poking" pain, intermittent; denies n/v, endorses some sob; NAD in triage; sent by pcp for further evaluation of abnormal EKG; endorses cyst on R chest  Verbal consent given for mse

## 2023-11-30 ENCOUNTER — Encounter: Payer: Self-pay | Admitting: Surgery

## 2023-11-30 ENCOUNTER — Other Ambulatory Visit: Payer: Self-pay | Admitting: Surgery

## 2023-11-30 DIAGNOSIS — R229 Localized swelling, mass and lump, unspecified: Secondary | ICD-10-CM

## 2023-12-01 ENCOUNTER — Ambulatory Visit
Admission: RE | Admit: 2023-12-01 | Discharge: 2023-12-01 | Disposition: A | Discharge status: Home / Self Care | Source: Ambulatory Visit | Attending: Surgery

## 2023-12-01 DIAGNOSIS — R229 Localized swelling, mass and lump, unspecified: Secondary | ICD-10-CM

## 2023-12-13 ENCOUNTER — Emergency Department (HOSPITAL_COMMUNITY)
Admission: EM | Admit: 2023-12-13 | Discharge: 2023-12-13 | Disposition: A | Discharge status: Home / Self Care | Attending: Emergency Medicine | Admitting: Emergency Medicine

## 2023-12-13 ENCOUNTER — Encounter (HOSPITAL_COMMUNITY): Payer: Self-pay

## 2023-12-13 ENCOUNTER — Emergency Department (HOSPITAL_COMMUNITY)

## 2023-12-13 ENCOUNTER — Other Ambulatory Visit: Payer: Self-pay

## 2023-12-13 DIAGNOSIS — M545 Low back pain, unspecified: Secondary | ICD-10-CM | POA: Diagnosis present

## 2023-12-13 DIAGNOSIS — M5442 Lumbago with sciatica, left side: Secondary | ICD-10-CM | POA: Diagnosis not present

## 2023-12-13 MED ORDER — DEXAMETHASONE SODIUM PHOSPHATE 10 MG/ML IJ SOLN: 10.0000 mg | Freq: Once | INTRAMUSCULAR | Status: DC

## 2023-12-13 MED ORDER — KETOROLAC TROMETHAMINE 15 MG/ML IJ SOLN
15.0000 mg | Freq: Once | INTRAMUSCULAR | Status: AC
  Administered 2023-12-13: 15 mg via INTRAMUSCULAR
  Filled 2023-12-13: qty 1

## 2023-12-13 NOTE — ED Triage Notes (Signed)
 Pt here from home with c/o low back pain  along with right lower tooth pain

## 2023-12-13 NOTE — ED Notes (Signed)
 Patient transported to X-ray

## 2023-12-13 NOTE — ED Provider Notes (Signed)
 [place] EMERGENCY DEPARTMENT AT [facility] Provider Note   CSN: [id number] Arrival date & time: 12/13/23  1213     Patient presents with: Back Pain   Natalie Thornton is a 38 y.o. female.   Patient is a 38 year old female who presents with back pain.  She states she has had a history of back issues for the last 10 to 15 years.  She had an MRI per her report in 2023 that showed some disc disease.  She states 3 days ago she was lifting a case of water and felt a pop in her lower back.  She has had some worsening pain since then.  She does have some intermittent radiation down her left leg but no persistent radiation.  No persistent numbness or weakness in her legs.  No loss of bowel or bladder control.  She says that when she has to go the bathroom, she has to go soon which sounds like urgency but this is not changed from the last several years.  She denies any recent fevers.       Prior to Admission medications   Medication Sig Start Date End Date Taking? Authorizing Provider  methylPREDNISolone  (MEDROL  DOSEPAK) 4 MG TBPK tablet 6 day taper; take as directed on package instructions 09/11/23   Vivienne Delon HERO, PA-C  traZODone  (DESYREL ) 50 MG tablet Take 1-2 tablets (50-100 mg total) by mouth at bedtime. 09/11/23   Vivienne Delon HERO, PA-C  trimethoprim -polymyxin b  (POLYTRIM ) ophthalmic solution Apply 1-2 drops into affected eye QID x 5 days. 07/12/23   Gladis Elsie BROCKS, PA-C    Allergies: Patient has no known allergies.    Review of Systems  Constitutional:  Negative for fever.  Gastrointestinal:  Negative for nausea and vomiting.  Musculoskeletal:  Positive for back pain. Negative for arthralgias, joint swelling and neck pain.  Skin:  Negative for wound.  Neurological:  Negative for weakness, numbness and headaches.    Updated Vital Signs BP 117/73 (BP Location: Right Arm)   Pulse 66   Temp 98.4 F (36.9 C)   Resp 14   Ht 4' 11 (1.499 m)   Wt 59 kg   LMP  12/15/2021   SpO2 98%   BMI 26.26 kg/m   Physical Exam Constitutional:      Appearance: She is well-developed.  HENT:     Head: Normocephalic and atraumatic.  Cardiovascular:     Rate and Rhythm: Normal rate.  Pulmonary:     Effort: Pulmonary effort is normal.  Musculoskeletal:        General: Tenderness present.     Cervical back: Normal range of motion and neck supple.     Comments: Positive tenderness to the lower lumbar spine.  No step-offs or deformities.  Negative straight leg raise bilaterally.  Patellar reflexes symmetric bilaterally.  She has normal sensation and motor function to the lower extremities.  Pedal pulses are intact.  Skin:    General: Skin is warm and dry.  Neurological:     Mental Status: She is alert and oriented to person, place, and time.     (all labs ordered are listed, but only abnormal results are displayed) Labs Reviewed - No data to display  EKG: None  Radiology: DG Lumbar Spine Complete Result Date: 12/13/2023 CLINICAL DATA:  back painDecember 24, 2023 EXAM: LUMBAR SPINE - COMPLETE 4+ VIEW COMPARISON:  None Available. 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 The L4-L5 disc space is  the lowest disc space. Normal alignment with expected lumbar lordosis. Vertebral body heights are well maintained without acute fracture. No pars interarticularis defects.Moderate intervertebral disc height loss at L4-L5. The soft tissues are otherwise unremarkable. IMPRESSION: 1. No acute fracture or malalignment of the lumbar spine. 2. Unchanged moderate degenerative disc disease at L4-L5. Electronically Signed   By: Rogelia Myers M.D.   On: 12/13/2023 15:40     Procedures   Medications Ordered in the ED  dexamethasone  (DECADRON ) injection 10 mg (has no administration in time range)  ketorolac  (TORADOL ) 15 MG/ML injection 15 mg (15 mg Intramuscular Given 12/13/23 1714)    Clinical Course as of 12/13/23  1933  Wed Dec 13, 2023  1701 DG Lumbar Spine Complete [CB]    Clinical Course User Index [CB] Beola Terrall RAMAN, PA-C                                 Medical Decision Making Risk Prescription drug management.   Patient is a 38 year old who presents with some worsening lower back pain.  She reports some intermittent radicular symptoms but no persistent symptoms.  Differential diagnosis includes musculoskeletal back pain, herniated disc, nerve impingement, cauda equina syndrome, epidural abscess or hematoma, bony mass  Patient does not have any neurologic deficits or signs of cauda equina.  She does not have fever or other clinical concerns for epidural abscess or hematoma.  She is otherwise well-appearing.  She was given a shot of Decadron .  On chart review, it looks like she gets regular prescriptions for oxycodone  with the last refill couple weeks ago.  She was treated for her pain here in the ED but due to this, not able to give her prescriptions for opioid pain medications.  Will give her referral to follow-up with neurosurgery.  She was discharged home in good condition.  Return precautions were given.     Final diagnoses:  Acute midline low back pain with left-sided sciatica    ED Discharge Orders     None          Lenor Hollering, MD 12/13/23 [phone]

## 2023-12-13 NOTE — ED Provider Triage Note (Signed)
 Emergency Medicine Provider Triage Evaluation Note  Natalie Thornton , a 38 y.o. female  was evaluated in triage.  Pt complains of back pain, tooth pain. Reports hx of back pain previously. States her last MRI was in 2023 and showed my L5 vertebrae was on the verge of collapse. States last week she lifted a case of water and felt a different type of pain in my low back and I am concerned by L5 collapsed. Reports pain radiates into her bilateral buttocks. Denies loss of bowel or bladder function or saddle anesthesia.  No urinary symptoms or abdominal pain.  Also reports that she is having pain in her right lower molar for a week and a half. She had an appointment with a dentist but missed it. She is able to swallow and denies fevers or chills  Review of Systems  Positive:  Negative:   Physical Exam  BP 117/73 (BP Location: Right Arm)   Pulse 66   Temp 98.4 F (36.9 C)   Resp 14   Ht 4' 11 (1.499 m)   Wt 59 kg   LMP 12/15/2021   SpO2 98%   BMI 26.26 kg/m  Gen:   Awake, no distress   Resp:  Normal effort  MSK:   Moves extremities without difficulty  Other:  Ambulatory. Right lower molar with pain, no gross abscess or obvious facial swelling  Medical Decision Making  Medically screening exam initiated at 1:02 PM.  Appropriate orders placed.  Natalie Thornton was informed that the remainder of the evaluation will be completed by another provider, this initial triage assessment does not replace that evaluation, and the importance of remaining in the ED until their evaluation is complete.     Nora Lauraine LABOR, PA-C 12/13/23 1310

## 2024-05-28 ENCOUNTER — Emergency Department (HOSPITAL_COMMUNITY)

## 2024-05-28 ENCOUNTER — Other Ambulatory Visit: Payer: Self-pay

## 2024-05-28 ENCOUNTER — Emergency Department (HOSPITAL_COMMUNITY)
Admission: EM | Admit: 2024-05-28 | Discharge: 2024-05-28 | Disposition: A | Discharge status: Home / Self Care | Attending: Emergency Medicine | Admitting: Emergency Medicine

## 2024-05-28 DIAGNOSIS — M545 Low back pain, unspecified: Secondary | ICD-10-CM | POA: Diagnosis present

## 2024-05-28 DIAGNOSIS — W19XXXA Unspecified fall, initial encounter: Secondary | ICD-10-CM | POA: Insufficient documentation

## 2024-05-28 DIAGNOSIS — F17290 Nicotine dependence, other tobacco product, uncomplicated: Secondary | ICD-10-CM | POA: Insufficient documentation

## 2024-05-28 LAB — PREGNANCY, URINE: Preg Test, Ur: NEGATIVE

## 2024-05-28 MED ORDER — METHOCARBAMOL 500 MG PO TABS: 500.0000 mg | ORAL_TABLET | Freq: Two times a day (BID) | ORAL | 0 refills | Status: DC

## 2024-05-28 MED ORDER — METHOCARBAMOL 500 MG PO TABS
750.0000 mg | ORAL_TABLET | Freq: Once | ORAL | Status: AC
  Administered 2024-05-28: 750 mg via ORAL
  Filled 2024-05-28: qty 2

## 2024-05-28 MED ORDER — ACETAMINOPHEN 500 MG PO TABS
1000.0000 mg | ORAL_TABLET | Freq: Once | ORAL | Status: AC
  Administered 2024-05-28: 1000 mg via ORAL
  Filled 2024-05-28: qty 2

## 2024-05-28 MED ORDER — METHOCARBAMOL 500 MG PO TABS: 500.0000 mg | ORAL_TABLET | Freq: Two times a day (BID) | ORAL | 0 refills | Status: AC

## 2024-05-28 MED ORDER — KETOROLAC TROMETHAMINE 15 MG/ML IJ SOLN
15.0000 mg | Freq: Once | INTRAMUSCULAR | Status: AC
  Administered 2024-05-28: 15 mg via INTRAMUSCULAR
  Filled 2024-05-28: qty 1

## 2024-05-28 NOTE — ED Triage Notes (Addendum)
 Pt arrived via POV d/t fall , pt states she tripped and fell down the steps 2 hours ago, c/o headache , pain on your bottom that radiated sown her left leg, rates pain 9/10.  HX degenerative disc on L5, so  fall has made pain worse

## 2024-05-28 NOTE — ED Triage Notes (Signed)
 Pt arrives ambulatory due to back pain after fall

## 2024-05-28 NOTE — ED Provider Triage Note (Addendum)
 Emergency Medicine Provider Triage Evaluation Note  Natalie Thornton , a 38 y.o. female  was evaluated in triage.  Pt complains of mechanical fall that occurred just prior to arrival.  She states she slipped on the trash back she was carrying which caused her to fall forward.  She struck her right side on the steps.  She states she has history of back issues.  This flared up her lower back pain.  No other complaints..  No urinary incontinence.  Ambulates without difficulty.  Review of Systems  Positive: As above Negative: As above  Physical Exam  BP 128/84 (BP Location: Right Arm)   Pulse 93   Temp 98.7 F (37.1 C)   Resp 16   Ht 4' 11 (1.499 m)   Wt 59 kg   LMP 12/15/2021   SpO2 98%   BMI 26.26 kg/m  Gen:   Awake, no distress   Resp:  Normal effort  MSK:   Moves extremities without difficulty  Other:  Tenderness over the lumbar spine.  Medical Decision Making  Medically screening exam initiated at 6:11 PM.  Appropriate orders placed.  TELEAH VILLAMAR was informed that the remainder of the evaluation will be completed by another provider, this initial triage assessment does not replace that evaluation, and the importance of remaining in the ED until their evaluation is complete.     Hildegard Loge, PA-C 05/28/24 1811    Hildegard Loge, PA-C 05/28/24 [phone]

## 2024-05-28 NOTE — Discharge Instructions (Addendum)
 You were seen today for back pain. While you were here we monitored your vitals, preformed a physical exam, and CT scans. These were all reassuring and there is no indication for any further testing or intervention in the emergency department at this time.   Things to do:  - Follow up with your primary care provider within the next 1-2 weeks - Please continue to use Robaxin  at home for the next several days as you are going to be sore after your fall  Return to the emergency department if you have any new or worsening symptoms including bowel or bladder incontinence, difficulty ambulating, numbness or tingling down your legs, decrease sensation of your pelvis, or if you have any other concerns.

## 2024-05-28 NOTE — ED Provider Notes (Signed)
 " [place] EMERGENCY DEPARTMENT AT [facility] Provider Note   HPI/ROS    History obtained from patient and EMR.  Natalie Thornton is a 38 y.o. 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  Patient presents today for back pain she sustained after having a fall.  States she was taken out the trash today when she slipped and fell forward.  She struck her right side and back on the steps.  She has a history of degenerative disc disease and her fall today caused her back pain to acutely worsen.  Denies any IV drug use, saddle anesthesia, difficulty ambulating, or numbness or tingling in the extremities.  Denies any visual changes with her mild headache.  Denies any fevers, chills, chest pain, shortness breath, nausea, abdomen, diarrhea.  MDM   I have reviewed the nursing documentation, vital signs, as well as the past medical history, surgical history, family history, and social history.  Initial Assessment:  Patient hemodynamically stable on initial evaluation with tenderness to palpation over the paraspinal muscles in the lumbar region.  Does have some midline L-spine tenderness without obvious step-offs or deformities.  Does have some muscle tightness in the paraspinal region.  No saddle anesthesia and gait largely intact.  Does have some limp while walking, but states is secondary to pain.  No true ataxia.  Urine pregnancy test negative.  Low concern for cauda equina given no saddle anesthesia, bowel or bladder incontinence, or other neurologic deficits.  Will attempt pain control with Tylenol , Toradol , and Robaxin .  Patient feeling better after Tylenol , Toradol , and Robaxin .  Given no signs of  significant spinal injury or cauda equina here feel patient stable for discharge.  Patient please follow-up with your PCP as soon as possible.  Patient given a work note.  Patient discharged home in hemodynamically stable condition with strict return precautions.   Disposition:  I discussed the plan for discharge with the patient and/or their surrogate at bedside prior to discharge and they were in agreement with the plan and verbalized understanding of the return precautions provided. All questions answered to the best of my ability. Ultimately, the patient was discharged in stable condition with stable vital signs. I am reassured that they are capable of close follow up and good social support at home.     This patient was staffed with Dr. Rogelia who supervised the visit and agreed with the plan of care.   Due to the patients current presenting symptoms, physical exam findings, and the workup stated above, it is thought that the etiology of the patients current presentation is: No diagnosis found.   Clinical Complexity A medically appropriate history, review of systems, and physical exam was performed.  Factors that affect the complexity of this encounter: assessment of correct protocol and laboratory work from this visit  My independent interpretations of diagnostic studies are documented in the ED course above.   If decision rules were used in this patient's evaluation, they are listed below.   Click here for ABCD2, HEART and other calculators  Patient's presentation is most consistent with acute illness / injury with system symptoms.  MDM generated using voice dictation software and may contain dictation errors. Please contact me for any clarification or with  any questions.    Physical Exam, PMH, PSH, Family History, and Social Hsitory   Vitals:   05/28/24 1742 05/28/24 1754  BP: 128/84   Pulse: 93   Resp: 16   Temp: 98.7 F (37.1 C)   SpO2: 98%   Weight:  59 kg   Height:  4' 11 (1.499 m)    Physical Exam Constitutional:      Appearance: Normal appearance.  HENT:     Head: Normocephalic and atraumatic.  Eyes:     Extraocular Movements: Extraocular movements intact.     Pupils: Pupils are equal, round, and reactive to light.  Cardiovascular:     Rate and Rhythm: Normal rate and regular rhythm.  Pulmonary:     Effort: Pulmonary effort is normal.     Breath sounds: Normal breath sounds. No wheezing or rhonchi.  Abdominal:     General: Abdomen is flat.     Palpations: Abdomen is soft.     Tenderness: There is no abdominal tenderness. There is no guarding or rebound.  Musculoskeletal:     Cervical back: Normal range of motion. No tenderness.     Thoracic back: No bony tenderness. Normal range of motion.     Lumbar back: Tenderness (Right and left paraspinal muscle tenderness) and bony tenderness present. Normal range of motion.  Skin:    General: Skin is warm and dry.     Capillary Refill: Capillary refill takes less than 2 seconds.  Neurological:     General: No focal deficit present.     Mental Status: She is alert and oriented to person, place, and time.     Past Medical History:  Diagnosis Date   Anxiety    Arthritis    Chronic back pain    Degeneration of intervertebral disc of lumbar region 07/30/2019   Degenerative disc disease, cervical    H pylori ulcer    H/O degenerative disc disease 2013   History of hiatal hernia    Low back pain    Numbness    Whole back    Pilonidal cyst      Past Surgical History:  Procedure Laterality Date   COLONOSCOPY     LIPOMA EXCISION Right 08/10/2018   Procedure: EXCISION LIPOMA, RIGHT;  Surgeon: Marolyn Nest, MD;  Location: [facility] ORS;  Service: General;  Laterality: Right;   VAGINAL HYSTERECTOMY N/A 12/28/2021   Procedure: HYSTERECTOMY VAGINAL WITH BILATERAL SALPINGECTOMY;  Surgeon: Lorence Ozell CROME, MD;  Location: [facility] OR;  Service: Gynecology;  Laterality: N/A;     Family  History  Problem Relation Age of Onset   Arthritis Paternal Grandfather    Head & neck cancer Paternal Grandmother        Back cancer   Lymphoma Paternal Grandmother    Arthritis Paternal Grandmother    AAA (abdominal aortic aneurysm) Maternal Grandmother    Leukemia Maternal Grandmother    Breast cancer Maternal Grandmother    Healthy Father    Hypertension Mother    Healthy Mother    Cirrhosis Maternal Uncle    Breast cancer Other    Colon cancer Neg Hx    Esophageal cancer Neg Hx    Rectal cancer Neg Hx    Stomach cancer Neg Hx     Social History   Tobacco Use   Smoking status: Some Days    Current packs/day: 1.00    Types: Cigars, Cigarettes    Passive exposure: Current   Smokeless tobacco: Never  Substance Use Topics  Alcohol use: Yes    Comment: occasionally     Procedures   If procedures were preformed on this patient, they are listed below:  Procedures   Electronically signed by:   Glendia Carlin Ancona, M.D. PGY-2, Emergency Medicine   Please note that this documentation was produced with the assistance of voice-to-text technology and may contain errors.    Ancona Glendia, MD 05/28/24 2158    Rogelia Jerilynn RAMAN, MD 06/01/24 [phone]  "

## 2024-05-29 ENCOUNTER — Emergency Department (HOSPITAL_COMMUNITY)
Admission: EM | Admit: 2024-05-29 | Discharge: 2024-05-29 | Disposition: A | Discharge status: Home / Self Care | Attending: Emergency Medicine | Admitting: Emergency Medicine

## 2024-05-29 DIAGNOSIS — F1129 Opioid dependence with unspecified opioid-induced disorder: Secondary | ICD-10-CM | POA: Diagnosis present

## 2024-05-29 DIAGNOSIS — M545 Low back pain, unspecified: Secondary | ICD-10-CM | POA: Insufficient documentation

## 2024-05-29 LAB — CBC WITH DIFFERENTIAL/PLATELET
Abs Immature Granulocytes: 0.02 K/uL (ref 0.00–0.07)
Basophils Absolute: 0 K/uL (ref 0.0–0.1)
Basophils Relative: 0 %
Eosinophils Absolute: 0 K/uL (ref 0.0–0.5)
Eosinophils Relative: 0 %
HCT: 40.2 % (ref 36.0–46.0)
Hemoglobin: 13.2 g/dL (ref 12.0–15.0)
Immature Granulocytes: 0 %
Lymphocytes Relative: 21 %
Lymphs Abs: 1.3 K/uL (ref 0.7–4.0)
MCH: 31.5 pg (ref 26.0–34.0)
MCHC: 32.8 g/dL (ref 30.0–36.0)
MCV: 95.9 fL (ref 80.0–100.0)
Monocytes Absolute: 0.4 K/uL (ref 0.1–1.0)
Monocytes Relative: 7 %
Neutro Abs: 4.3 K/uL (ref 1.7–7.7)
Neutrophils Relative %: 72 %
Platelets: 326 K/uL (ref 150–400)
RBC: 4.19 MIL/uL (ref 3.87–5.11)
RDW: 12.1 % (ref 11.5–15.5)
WBC: 6 K/uL (ref 4.0–10.5)
nRBC: 0 % (ref 0.0–0.2)

## 2024-05-29 LAB — URINE DRUG SCREEN
Amphetamines: NEGATIVE
Barbiturates: NEGATIVE
Benzodiazepines: NEGATIVE
Cocaine: NEGATIVE
Fentanyl: NEGATIVE
Methadone Scn, Ur: NEGATIVE
Opiates: NEGATIVE
Tetrahydrocannabinol: NEGATIVE

## 2024-05-29 LAB — URINALYSIS, ROUTINE W REFLEX MICROSCOPIC
Bilirubin Urine: NEGATIVE
Glucose, UA: NEGATIVE mg/dL
Hgb urine dipstick: NEGATIVE
Ketones, ur: NEGATIVE mg/dL
Leukocytes,Ua: NEGATIVE
Nitrite: NEGATIVE
Protein, ur: NEGATIVE mg/dL
Specific Gravity, Urine: 1.02 (ref 1.005–1.030)
pH: 6 (ref 5.0–8.0)

## 2024-05-29 LAB — HCG, SERUM, QUALITATIVE: Preg, Serum: NEGATIVE

## 2024-05-29 LAB — BASIC METABOLIC PANEL WITH GFR
Anion gap: 10 (ref 5–15)
BUN: 9 mg/dL (ref 6–20)
CO2: 23 mmol/L (ref 22–32)
Calcium: 9.2 mg/dL (ref 8.9–10.3)
Chloride: 105 mmol/L (ref 98–111)
Creatinine, Ser: 0.83 mg/dL (ref 0.44–1.00)
GFR, Estimated: >60 mL/min
Glucose, Bld: 134 mg/dL — ABNORMAL HIGH (ref 70–99)
Potassium: 4.3 mmol/L (ref 3.5–5.1)
Sodium: 138 mmol/L (ref 135–145)

## 2024-05-29 MED ORDER — LOPERAMIDE HCL 2 MG PO CAPS: 2.0000 mg | ORAL_CAPSULE | Freq: Four times a day (QID) | ORAL | 0 refills | Status: AC | PRN

## 2024-05-29 MED ORDER — NALOXONE HCL 4 MG/0.1ML NA LIQD: NASAL | 0 refills | Status: AC

## 2024-05-29 MED ORDER — OXYCODONE-ACETAMINOPHEN 5-325 MG PO TABS
2.0000 | ORAL_TABLET | Freq: Once | ORAL | Status: AC
  Administered 2024-05-29: 2 via ORAL
  Filled 2024-05-29: qty 2

## 2024-05-29 MED ORDER — KETOROLAC TROMETHAMINE 30 MG/ML IJ SOLN
30.0000 mg | Freq: Once | INTRAMUSCULAR | Status: AC
  Administered 2024-05-29: 30 mg via INTRAMUSCULAR
  Filled 2024-05-29: qty 1

## 2024-05-29 MED ORDER — HYDROXYZINE HCL 25 MG PO TABS: 25.0000 mg | ORAL_TABLET | Freq: Four times a day (QID) | ORAL | 0 refills | Status: AC

## 2024-05-29 MED ORDER — DICYCLOMINE HCL 20 MG PO TABS: 20.0000 mg | ORAL_TABLET | Freq: Two times a day (BID) | ORAL | 0 refills | Status: AC

## 2024-05-29 MED ORDER — ONDANSETRON 4 MG PO TBDP: 4.0000 mg | ORAL_TABLET | Freq: Three times a day (TID) | ORAL | 0 refills | Status: AC | PRN

## 2024-05-29 NOTE — ED Notes (Signed)
 Pt requested to speak with provider again before discharge. Pt requested to stay here for inpatient treatment. Pa and rn explained to pt that this was not possible as that is not offered here. Pt given a list of resources. Pt verbalized understanding of discharge instructions.

## 2024-05-29 NOTE — ED Triage Notes (Signed)
 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 Pt states she ran out of the medication last week and feels like she is going through withdrawal due to not having medication. Back pain is bad but also feeling body aches and not wanting to do anything. Pt needs medication but also would like to get off medication but doesn't want to stop cold turkey. Pt is not due for a refill of medication for another week or 2.

## 2024-05-29 NOTE — ED Provider Notes (Signed)
 " [facility] Provider Note   CSN: [id number] Arrival date & time: 05/29/24  1227    Patient presents with: Addiction Problem   Natalie Thornton is a 38 y.o. female patient here requesting medications to help with withdrawal.  She states she has chronic lower back pain.  She is prescribed oxycodone  10 mg 4 times daily however she is taking 2 tablets (20 mg) at a time every 2-3 hours at home she subsequently ran out of her prescription which she just filled less than 2 weeks ago.  She states her last dose was this morning. Denies any SI, HI, AVH. Denies recent falls or injuries.  No bowel or bladder incontinence, saddle paresthesia, numbness, weakness, fever, history of IVDU.  She states she has had some nausea and a few episodes of loose stool she is afraid of withdrawing given she is out of her medications.  She does think that she has a dependence problem with her opiates.  She states she tried to call her pain management provider however was unable to be seen by them due to the holiday.  Patient is ambulatory here without difficulty.  She denies any SI, HI, AVH.  No history of unintentional or intentional overdose.  Note patient was seen in the emergency department for injury.  Not DC home on opiates at that time.  Initially told triage rn she ran out of her prescription medication a week ago, told another triage provider that she took her last dose of medication this morning.   HPI     Prior to Admission medications  Medication Sig Start Date End Date Taking? Authorizing Provider  dicyclomine  (BENTYL ) 20 MG tablet Take 1 tablet (20 mg total) by mouth 2 (two) times daily. 05/29/24  Yes Baani Bober A, PA-C  hydrOXYzine  (ATARAX ) 25 MG tablet Take 1 tablet (25 mg total) by mouth every 6 (six) hours. 05/29/24  Yes Sanaya Gwilliam A, PA-C  loperamide  (IMODIUM ) 2 MG capsule Take 1 capsule (2 mg total) by mouth 4 (four) times daily as needed for  diarrhea or loose stools. 05/29/24  Yes Parys Elenbaas A, PA-C  naloxone  (NARCAN ) nasal spray 4 mg/0.1 mL Use for potential opioid OVERDOSE 05/29/24  Yes Kayly Kriegel A, PA-C  ondansetron  (ZOFRAN -ODT) 4 MG disintegrating tablet Take 1 tablet (4 mg total) by mouth every 8 (eight) hours as needed. 05/29/24  Yes Markus Casten A, PA-C  methocarbamol  (ROBAXIN ) 500 MG tablet Take 1 tablet (500 mg total) by mouth 2 (two) times daily. 05/28/24   Guillermina Hamilton, MD  methylPREDNISolone  (MEDROL  DOSEPAK) 4 MG TBPK tablet 6 day taper; take as directed on package instructions 09/11/23   Vivienne Delon HERO, PA-C  traZODone  (DESYREL ) 50 MG tablet Take 1-2 tablets (50-100 mg total) by mouth at bedtime. 09/11/23   Vivienne Delon HERO, PA-C  trimethoprim -polymyxin b  (POLYTRIM ) ophthalmic solution Apply 1-2 drops into affected eye QID x 5 days. 07/12/23   Gladis Elsie BROCKS, PA-C    Allergies: Patient has no known allergies.    Review of Systems  Constitutional: Negative.   HENT: Negative.    Respiratory: Negative.    Cardiovascular: Negative.   Gastrointestinal:  Positive for diarrhea and nausea. Negative for abdominal distention, abdominal pain, anal bleeding, blood in stool and vomiting.  Genitourinary: Negative.   Musculoskeletal:  Positive for back pain.  Skin: Negative.   Neurological: Negative.   All other systems reviewed and are negative.   Updated Vital Signs BP 130/83 (  BP Location: Left Arm)   Pulse 79   Temp 99.4 F (37.4 C) (Oral)   Resp 16   Wt 59 kg   LMP 12/15/2021   SpO2 100%   BMI 26.26 kg/m   Physical Exam Vitals and nursing note reviewed.  Constitutional:      General: She is not in acute distress.    Appearance: She is well-developed. She is not ill-appearing, toxic-appearing or diaphoretic.  HENT:     Head: Atraumatic.  Eyes:     Pupils: Pupils are equal, round, and reactive to light.  Cardiovascular:     Rate and Rhythm: Normal rate.     Pulses: Normal pulses.      Heart sounds: Normal heart sounds.  Pulmonary:     Effort: Pulmonary effort is normal. No respiratory distress.     Breath sounds: Normal breath sounds.  Abdominal:     General: Bowel sounds are normal. There is no distension.     Palpations: Abdomen is soft.     Tenderness: There is no abdominal tenderness. There is no right CVA tenderness, left CVA tenderness, guarding or rebound.  Musculoskeletal:        General: Tenderness present. No swelling, deformity or signs of injury. Normal range of motion.     Cervical back: Normal range of motion.     Right lower leg: No edema.     Left lower leg: No edema.     Comments: Tenderness midline lumbar region, negative straight leg raise, ambulatory, nontender bilateral lower extremities.  Skin:    General: Skin is warm and dry.     Capillary Refill: Capillary refill takes less than 2 seconds.  Neurological:     General: No focal deficit present.     Mental Status: She is alert.     Cranial Nerves: No cranial nerve deficit.     Sensory: No sensory deficit.     Motor: No weakness.     Gait: Gait normal.  Psychiatric:        Mood and Affect: Mood normal.     (all labs ordered are listed, but only abnormal results are displayed) Labs Reviewed  BASIC METABOLIC PANEL WITH GFR - Abnormal; Notable for the following components:      Result Value   Glucose, Bld 134 (*)    All other components within normal limits  URINALYSIS, ROUTINE W REFLEX MICROSCOPIC - Abnormal; Notable for the following components:   APPearance HAZY (*)    All other components within normal limits  CBC WITH DIFFERENTIAL/PLATELET  HCG, SERUM, QUALITATIVE  URINE DRUG SCREEN    EKG: None  Radiology: CT Lumbar Spine Wo Contrast Result Date: 05/28/2024 CLINICAL DATA:  Low back pain fall EXAM: CT LUMBAR SPINE WITHOUT CONTRAST TECHNIQUE: Multidetector CT imaging of the lumbar spine was performed without intravenous contrast administration. Multiplanar CT image  reconstructions were also generated. RADIATION DOSE REDUCTION: This exam was performed according to the departmental dose-optimization program which includes automated exposure control, adjustment of the mA and/or kV according to patient size and/or use of iterative reconstruction technique. COMPARISON:  Radiograph 12/13/2023, CT 05/29/2022, chest x-ray 06/14/2023 FINDINGS: Segmentation: Chest x-ray demonstrates 13 rib pairs. For the purposes of reporting, L5 will be designated the last well-formed vertebra, there are therefore paired ribs at L1. This numbering scheme is slightly different as compared with the CT from 2023. Alignment: Stable grade 1 retrolisthesis L5 on S1 Vertebrae: No acute fracture or focal pathologic process. Paraspinal and other soft tissues: No  acute finding. Disc levels: At L4-L5, circumferential disc bulge with bilateral foraminal component. Ligamentum flavum thickening and facet degenerative changes. Mild to moderate canal stenosis. At L5-S1, moderate advanced disc space narrowing. Circumferential disc bulge with right foraminal component. Right greater than left foraminal narrowing IMPRESSION: 1. No CT evidence for acute osseous abnormality. 2. Degenerative changes at L4-L5 and L5-S1. Electronically Signed   By: Luke Bun M.D.   On: 05/28/2024 20:23     Procedures   Medications Ordered in the ED  oxyCODONE -acetaminophen  (PERCOCET/ROXICET) 5-325 MG per tablet 2 tablet (2 tablets Oral Given 05/29/24 1747)  ketorolac  (TORADOL ) 30 MG/ML injection 30 mg (30 mg Intramuscular Given 05/29/24 5524)    38 year old history of opiate dependence due to chronic back pain here for evaluation of withdrawal and out of her home medications.  Unfortunately has been taking her oxycodone  10 mg tablets more frequently than prescribed and is subsequently ran out of her prescription early. Rxed 10 mg 4x daily. Taking 10 mg tabs x2 every 2-3 hours at home.  Story unclear exactly when her last dose  was.  She tells me her last dose was this morning, told another triage provider her last dose was a week ago.  She has had some nausea and a few episodes of loose stool.  She understands that she has opiate dependence and would like to seek treatment.  Patient here appears otherwise well.  Ambulatory here in the ED.  She is a nonfocal neuroexam.  Patient does not appear to be clinically withdrawing at this time.  Labs personally viewed and interpreted ordered from triage:  No significant lab abnormality Specifically UDS negative for opiates  Unclear why patient would be negative for opiates when she tells me that she took her last dose less than 12 hours PTA she has been taking this chronically expect her opiates to be positive on her UDS.  Long discussion with patient and family at bedside if she is taking her medication as frequently at the dosing that she is taking I have high concern for unintentional overdose.  She has no SI, HI, AVH.  She is initially requesting that I send an additional prescription in to the pharmacy for home opiate prescription however pharmacy is not going to fill this prescription given she filled 120 tablets 2 weeks ago as well as my clinical concern for possible unintentional overdose.  She then request medications for opiate withdrawal and outpatient resources.  Prescription sent for withdrawal outpatient.  Urged to follow-up with her pain specialist if her pain is uncontrolled with her home medications.  Given outpatient resources for inpatient and outpatient detox.  Will have her return for new or worsening symptoms.  Low suspicion for acute traumatic injury, cauda equina, discitis, osteomyelitis, transverse myelitis, abscess, acute intra-abdominal etiology as cause of her symptoms.  She has no active SI, HI.  Not actively withdrawing here in the ED.  Tori at the ED Doors without difficulty  The patient has been appropriately medically screened and/or stabilized in the  ED. I have low suspicion for any other emergent medical condition which would require further screening, evaluation or treatment in the ED or require inpatient management.  Patient is hemodynamically stable and in no acute distress.  Patient able to ambulate in department prior to ED.  Evaluation does not show acute pathology that would require ongoing or additional emergent interventions while in the emergency department or further inpatient treatment.  I have discussed the diagnosis with the patient and answered  all questions.  Pain is been managed while in the emergency department and patient has no further complaints prior to discharge.  Patient is comfortable with plan discussed in room and is stable for discharge at this time.  I have discussed strict return precautions for returning to the emergency department.  Patient was encouraged to follow-up with PCP/specialist refer to at discharge.                                      Medical Decision Making Amount and/or Complexity of Data Reviewed Independent Historian: friend External Data Reviewed: labs, radiology and notes. Labs: ordered. Decision-making details documented in ED Course. Radiology:  Decision-making details documented in ED Course.  Risk OTC drugs. Prescription drug management. Decision regarding hospitalization. Diagnosis or treatment significantly limited by social determinants of health.       Final diagnoses:  Opioid dependence with opioid-induced disorder (HCC)    ED Discharge Orders          Ordered    ondansetron  (ZOFRAN -ODT) 4 MG disintegrating tablet  Every 8 hours PRN        05/29/24 1802    loperamide  (IMODIUM ) 2 MG capsule  4 times daily PRN        05/29/24 1802    dicyclomine  (BENTYL ) 20 MG tablet  2 times daily        05/29/24 1802    hydrOXYzine  (ATARAX ) 25 MG tablet  Every 6 hours        05/29/24 1802    naloxone  (NARCAN ) nasal spray 4 mg/0.1 mL        05/29/24 1803                Stefana Lodico A, PA-C 05/29/24 1927  "

## 2024-05-29 NOTE — ED Provider Triage Note (Signed)
 Emergency Medicine Provider Triage Evaluation Note  Natalie Thornton , a 38 y.o. female  was evaluated in triage.  Pt complains of chronic opiate dependence, states that she at baseline takes 10 mg oxycodone  x 2 every 4-6 hours.  This is secondary to degenerative disc disease.  Is also noted patient was evaluated in this ED approximately 1 day before for a chronic history of back pain, an acute mechanical fall that occurred prior to assessment yesterday.  Does not have any complaints of acute injury at her assessment today..  Review of Systems  Positive: As above Negative:   Physical Exam  BP (!) 141/93   Pulse 78   Temp 99.2 F (37.3 C) (Oral)   Resp 17   Wt 59 kg   LMP 12/15/2021   SpO2 100%   BMI 26.26 kg/m  Gen:   Awake, no distress   Resp:  Normal effort  MSK:   Moves extremities without difficulty  Other:    Medical Decision Making  Medically screening exam initiated at 1:55 PM.  Appropriate orders placed.  Natalie Thornton was informed that the remainder of the evaluation will be completed by another provider, this initial triage assessment does not replace that evaluation, and the importance of remaining in the ED until their evaluation is complete.  Please initial labs and UDS   Myriam Dorn BROCKS, [place] 05/29/24 1356

## 2024-05-29 NOTE — ED Triage Notes (Signed)
 POV/ ambulatory/ pt states she wants help with oxycodone  addiction/ pt is prescribed them for DDD, but feels she is too dependent on them/ last use was 15 mins PTA/ A&OX4/ denies withdrawal symptoms at this time

## 2024-05-29 NOTE — Discharge Instructions (Addendum)
 It was a pleasure taking care of you here today for an for a few medications to help with any withdrawal symptoms  I have also written for Narcan  which is a nasal spray that you would use in case of a potential overdose.  As we discussed you need close follow-up outpatient for your back pain.  I am very concerned given the amount of pain medication that you take that overdose is a potential if you continue taking the amount.  If you would like inpatient treatment as we discussed there are a few resources attached.  Return for new or worsening symptoms

## 2024-05-31 ENCOUNTER — Encounter (HOSPITAL_COMMUNITY): Payer: Self-pay

## 2024-05-31 ENCOUNTER — Other Ambulatory Visit: Payer: Self-pay

## 2024-05-31 ENCOUNTER — Emergency Department (HOSPITAL_COMMUNITY)
Admission: EM | Admit: 2024-05-31 | Discharge: 2024-05-31 | Disposition: A | Discharge status: Home / Self Care | Attending: Emergency Medicine | Admitting: Emergency Medicine

## 2024-05-31 DIAGNOSIS — R059 Cough, unspecified: Secondary | ICD-10-CM | POA: Diagnosis present

## 2024-05-31 DIAGNOSIS — J069 Acute upper respiratory infection, unspecified: Secondary | ICD-10-CM | POA: Diagnosis not present

## 2024-05-31 DIAGNOSIS — M545 Low back pain, unspecified: Secondary | ICD-10-CM

## 2024-05-31 DIAGNOSIS — M5440 Lumbago with sciatica, unspecified side: Secondary | ICD-10-CM | POA: Diagnosis not present

## 2024-05-31 LAB — RESP PANEL BY RT-PCR (RSV, FLU A&B, COVID)  RVPGX2
Influenza A by PCR: NEGATIVE
Influenza B by PCR: NEGATIVE
Resp Syncytial Virus by PCR: NEGATIVE
SARS Coronavirus 2 by RT PCR: NEGATIVE

## 2024-05-31 MED ORDER — OXYCODONE HCL 5 MG PO TABS
10.0000 mg | ORAL_TABLET | Freq: Once | ORAL | Status: AC
  Administered 2024-05-31: 10 mg via ORAL
  Filled 2024-05-31: qty 2

## 2024-05-31 NOTE — ED Provider Notes (Signed)
 " [place] [facility] Provider Note   CSN: [id number] Arrival date & time: 05/31/24  9041     Patient presents with: No chief complaint on file.   Natalie Thornton is a 38 y.o. female here with chronic low back pain, congestion, cold symptoms x 2 days. Works in group home.  Goes to pain management clinic for her back but ran out of her oxycodone  10 mg tablets for the month and the clinic is closed now on the holiday.   HPI     Prior to Admission medications  Medication Sig Start Date End Date Taking? Authorizing Provider  dicyclomine  (BENTYL ) 20 MG tablet Take 1 tablet (20 mg total) by mouth 2 (two) times daily. 05/29/24   Henderly, Britni A, PA-C  hydrOXYzine  (ATARAX ) 25 MG tablet Take 1 tablet (25 mg total) by mouth every 6 (six) hours. 05/29/24   Henderly, Britni A, PA-C  loperamide  (IMODIUM ) 2 MG capsule Take 1 capsule (2 mg total) by mouth 4 (four) times daily as needed for diarrhea or loose stools. 05/29/24   Henderly, Britni A, PA-C  methocarbamol  (ROBAXIN ) 500 MG tablet Take 1 tablet (500 mg total) by mouth 2 (two) times daily. 05/28/24   Guillermina Hamilton, MD  methylPREDNISolone  (MEDROL  DOSEPAK) 4 MG TBPK tablet 6 day taper; take as directed on package instructions 09/11/23   Vivienne Delon HERO, PA-C  naloxone  (NARCAN ) nasal spray 4 mg/0.1 mL Use for potential opioid OVERDOSE 05/29/24   Henderly, Britni A, PA-C  ondansetron  (ZOFRAN -ODT) 4 MG disintegrating tablet Take 1 tablet (4 mg total) by mouth every 8 (eight) hours as needed. 05/29/24   Henderly, Britni A, PA-C  traZODone  (DESYREL ) 50 MG tablet Take 1-2 tablets (50-100 mg total) by mouth at bedtime. 09/11/23   Vivienne Delon HERO, PA-C  trimethoprim -polymyxin b  (POLYTRIM ) ophthalmic solution Apply 1-2 drops into affected eye QID x 5 days. 07/12/23   Gladis Elsie BROCKS, PA-C    Allergies: Patient has no known allergies.    Review of Systems  Updated Vital Signs BP (!) 144/98   Pulse 86    Temp 98.1 F (36.7 C)   Resp 16   LMP 12/15/2021   SpO2 100%   Physical Exam Constitutional:      General: She is not in acute distress. HENT:     Head: Normocephalic and atraumatic.  Eyes:     Conjunctiva/sclera: Conjunctivae normal.     Pupils: Pupils are equal, round, and reactive to light.  Cardiovascular:     Rate and Rhythm: Normal rate and regular rhythm.  Pulmonary:     Effort: Pulmonary effort is normal. No respiratory distress.  Abdominal:     General: There is no distension.     Tenderness: There is no abdominal tenderness.  Skin:    General: Skin is warm and dry.  Neurological:     General: No focal deficit present.     Mental Status: She is alert and oriented to person, place, and time. Mental status is at baseline.  Psychiatric:        Mood and Affect: Mood normal.        Behavior: Behavior normal.     (all labs ordered are listed, but only abnormal results are displayed) Labs Reviewed  RESP PANEL BY RT-PCR (RSV, FLU A&B, COVID)  [id number]    EKG: None  Radiology: No results found.   Procedures   Medications Ordered in the ED  oxyCODONE  (Oxy IR/ROXICODONE ) immediate release tablet  10 mg (has no administration in time range)                                    Medical Decision Making Risk Prescription drug management.   Chronic low back pain - no trauma or concern for cauda equina syndrome or epidural infection.  No indication for labs or imaging.  Discussed that ER will not refill chronic pain medication.  She asked for a single dose to be given here for pain management while trying to arrange follow up.  Will provide 1 dose oxycodone  and discharge.  Likely viral URI x 2 days.  She'll follow up on covid/flu results online today.     Final diagnoses:  Low back pain, unspecified back pain laterality, unspecified chronicity, unspecified whether sciatica present  Viral URI    ED Discharge Orders     None          Cottie Donnice PARAS,  MD 05/31/24 1031  "

## 2024-05-31 NOTE — ED Triage Notes (Signed)
 Pt c/o back pain x several years; states pain worse, reports recent cold symptoms, productive cough, rhinorrhea, body aches; denies fevers; denies new injury  Pt gives verbal consent for mse

## 2024-05-31 NOTE — Discharge Instructions (Addendum)
 Please follow up on your covid/flu test result online on your patient portal.  If you test POSITIVE for either virus, you should quarantine for 7 days from the start of your symptoms. Wear a face mask and wash your hands frequently.
